# Patient Record
Sex: Male | Born: 1965 | Race: White | Hispanic: No | Marital: Single | Smoking: Former smoker
Health system: Southern US, Community
[De-identification: ages and names within clinical notes are randomized; demographics above are authoritative.]

## PROBLEM LIST (undated history)

## (undated) DIAGNOSIS — N189 Chronic kidney disease, unspecified: Secondary | ICD-10-CM

## (undated) DIAGNOSIS — N186 End stage renal disease: Secondary | ICD-10-CM

## (undated) DIAGNOSIS — D649 Anemia, unspecified: Secondary | ICD-10-CM

## (undated) DIAGNOSIS — I10 Essential (primary) hypertension: Secondary | ICD-10-CM

## (undated) DIAGNOSIS — N049 Nephrotic syndrome with unspecified morphologic changes: Secondary | ICD-10-CM

## (undated) HISTORY — DX: Chronic kidney disease, unspecified: N18.9

## (undated) HISTORY — PX: RENAL BIOPSY: SHX156

## (undated) HISTORY — PX: CHOLECYSTECTOMY: SHX55

## (undated) HISTORY — DX: Essential (primary) hypertension: I10

---

## 2006-08-20 ENCOUNTER — Observation Stay: Payer: Self-pay | Admitting: Internal Medicine

## 2006-08-20 ENCOUNTER — Other Ambulatory Visit: Payer: Self-pay

## 2008-01-05 ENCOUNTER — Emergency Department: Payer: Self-pay | Admitting: Emergency Medicine

## 2008-01-05 ENCOUNTER — Other Ambulatory Visit: Payer: Self-pay

## 2009-05-03 ENCOUNTER — Emergency Department: Payer: Self-pay | Admitting: Emergency Medicine

## 2010-08-27 ENCOUNTER — Emergency Department: Payer: Self-pay | Admitting: Emergency Medicine

## 2012-11-25 ENCOUNTER — Emergency Department: Payer: Self-pay | Admitting: Emergency Medicine

## 2012-11-25 LAB — COMPREHENSIVE METABOLIC PANEL
Alkaline Phosphatase: 81 U/L (ref 50–136)
BUN: 23 mg/dL — ABNORMAL HIGH (ref 7–18)
Calcium, Total: 8.5 mg/dL (ref 8.5–10.1)
Chloride: 112 mmol/L — ABNORMAL HIGH (ref 98–107)
Co2: 24 mmol/L (ref 21–32)
EGFR (African American): 50 — ABNORMAL LOW
EGFR (Non-African Amer.): 43 — ABNORMAL LOW
Osmolality: 284 (ref 275–301)
Potassium: 4.2 mmol/L (ref 3.5–5.1)
SGOT(AST): 30 U/L (ref 15–37)
Total Protein: 6.5 g/dL (ref 6.4–8.2)

## 2012-11-25 LAB — CBC
HCT: 45.6 % (ref 40.0–52.0)
HGB: 15.6 g/dL (ref 13.0–18.0)
MCHC: 34.1 g/dL (ref 32.0–36.0)
MCV: 95 fL (ref 80–100)
RBC: 4.81 10*6/uL (ref 4.40–5.90)
WBC: 6.3 10*3/uL (ref 3.8–10.6)

## 2012-11-25 LAB — CK TOTAL AND CKMB (NOT AT ARMC): CK-MB: 0.9 ng/mL (ref 0.5–3.6)

## 2013-06-06 ENCOUNTER — Emergency Department: Payer: Self-pay | Admitting: Emergency Medicine

## 2013-06-06 LAB — COMPREHENSIVE METABOLIC PANEL
Albumin: 3.3 g/dL — ABNORMAL LOW (ref 3.4–5.0)
Alkaline Phosphatase: 84 U/L (ref 50–136)
Anion Gap: 6 — ABNORMAL LOW (ref 7–16)
Bilirubin,Total: 0.4 mg/dL (ref 0.2–1.0)
Chloride: 105 mmol/L (ref 98–107)
Co2: 28 mmol/L (ref 21–32)
Creatinine: 2.19 mg/dL — ABNORMAL HIGH (ref 0.60–1.30)
EGFR (African American): 40 — ABNORMAL LOW
EGFR (Non-African Amer.): 35 — ABNORMAL LOW
Glucose: 99 mg/dL (ref 65–99)
Osmolality: 283 (ref 275–301)
SGOT(AST): 25 U/L (ref 15–37)
Sodium: 139 mmol/L (ref 136–145)

## 2013-06-06 LAB — URINALYSIS, COMPLETE
Bilirubin,UR: NEGATIVE
Hyaline Cast: 2
Ketone: NEGATIVE
Leukocyte Esterase: NEGATIVE
Ph: 6 (ref 4.5–8.0)
Protein: 150

## 2013-06-06 LAB — CBC
MCHC: 35.4 g/dL (ref 32.0–36.0)
Platelet: 366 10*3/uL (ref 150–440)
WBC: 9.7 10*3/uL (ref 3.8–10.6)

## 2014-04-26 ENCOUNTER — Emergency Department: Payer: Self-pay | Admitting: Emergency Medicine

## 2014-08-09 ENCOUNTER — Inpatient Hospital Stay: Payer: Self-pay | Admitting: Internal Medicine

## 2014-08-09 LAB — COMPREHENSIVE METABOLIC PANEL
Albumin: 2.3 g/dL — ABNORMAL LOW (ref 3.4–5.0)
Alkaline Phosphatase: 77 U/L
Anion Gap: 6 — ABNORMAL LOW (ref 7–16)
BUN: 39 mg/dL — ABNORMAL HIGH (ref 7–18)
Bilirubin,Total: 0.3 mg/dL (ref 0.2–1.0)
CALCIUM: 8.9 mg/dL (ref 8.5–10.1)
Chloride: 110 mmol/L — ABNORMAL HIGH (ref 98–107)
Co2: 24 mmol/L (ref 21–32)
Creatinine: 2.8 mg/dL — ABNORMAL HIGH (ref 0.60–1.30)
GFR CALC AF AMER: 31 — AB
GFR CALC NON AF AMER: 26 — AB
Glucose: 105 mg/dL — ABNORMAL HIGH (ref 65–99)
Osmolality: 289 (ref 275–301)
Potassium: 4 mmol/L (ref 3.5–5.1)
SGOT(AST): 27 U/L (ref 15–37)
SGPT (ALT): 35 U/L
Sodium: 140 mmol/L (ref 136–145)
Total Protein: 5.8 g/dL — ABNORMAL LOW (ref 6.4–8.2)

## 2014-08-09 LAB — CBC
HCT: 43.2 % (ref 40.0–52.0)
HGB: 14.8 g/dL (ref 13.0–18.0)
MCH: 32.6 pg (ref 26.0–34.0)
MCHC: 34.2 g/dL (ref 32.0–36.0)
MCV: 96 fL (ref 80–100)
PLATELETS: 336 10*3/uL (ref 150–440)
RBC: 4.53 10*6/uL (ref 4.40–5.90)
RDW: 12.3 % (ref 11.5–14.5)
WBC: 10.5 10*3/uL (ref 3.8–10.6)

## 2014-08-09 LAB — URINALYSIS, COMPLETE
BACTERIA: NONE SEEN
BLOOD: NEGATIVE
Bilirubin,UR: NEGATIVE
Glucose,UR: 150 mg/dL (ref 0–75)
Granular Cast: 5
Hyaline Cast: 2
Ketone: NEGATIVE
Leukocyte Esterase: NEGATIVE
Nitrite: NEGATIVE
Ph: 6 (ref 4.5–8.0)
Protein: >=500
RBC,UR: 1 /HPF (ref 0–5)
SPECIFIC GRAVITY: 1.014 (ref 1.003–1.030)
Squamous Epithelial: NONE SEEN
WBC UR: 1 /HPF (ref 0–5)

## 2014-08-09 LAB — TROPONIN I
Troponin-I: 0.04 ng/mL
Troponin-I: 0.04 ng/mL
Troponin-I: 0.04 ng/mL

## 2014-08-09 LAB — INFLUENZA A,B,H1N1 - PCR (ARMC)
H1N1FLUPCR: NOT DETECTED
Influenza A By PCR: NEGATIVE
Influenza B By PCR: NEGATIVE

## 2014-08-09 LAB — PROTEIN / CREATININE RATIO, URINE
CREATININE, URINE: 78.9 mg/dL (ref 30.0–125.0)
PROTEIN, RANDOM URINE: 672 mg/dL — AB (ref 0–12)
Protein/Creat. Ratio: 8517 mg/gCREAT — ABNORMAL HIGH (ref 0–200)

## 2014-08-09 LAB — CREATININE, SERUM
Creatinine: 2.44 mg/dL — ABNORMAL HIGH (ref 0.60–1.30)
EGFR (Non-African Amer.): 30 — ABNORMAL LOW
GFR CALC AF AMER: 37 — AB

## 2014-08-09 LAB — CK TOTAL AND CKMB (NOT AT ARMC)
CK, TOTAL: 156 U/L
CK, Total: 442 U/L — ABNORMAL HIGH
CK, Total: 461 U/L — ABNORMAL HIGH
CK-MB: 1.8 ng/mL (ref 0.5–3.6)
CK-MB: 2.7 ng/mL (ref 0.5–3.6)
CK-MB: 2.4 ng/mL (ref 0.5–3.6)

## 2014-08-09 LAB — LIPASE, BLOOD: LIPASE: 307 U/L (ref 73–393)

## 2014-08-09 LAB — CLOSTRIDIUM DIFFICILE(ARMC)

## 2014-08-09 LAB — HEMOGLOBIN A1C: Hemoglobin A1C: 5.2 % (ref 4.2–6.3)

## 2014-08-09 LAB — TSH: Thyroid Stimulating Horm: 1.8 u[IU]/mL

## 2014-08-10 LAB — CBC WITH DIFFERENTIAL/PLATELET
Basophil #: 0.1 10*3/uL (ref 0.0–0.1)
Basophil %: 0.9 %
EOS PCT: 3.3 %
Eosinophil #: 0.3 10*3/uL (ref 0.0–0.7)
HCT: 39.3 % — AB (ref 40.0–52.0)
HGB: 13.2 g/dL (ref 13.0–18.0)
Lymphocyte #: 1.8 10*3/uL (ref 1.0–3.6)
Lymphocyte %: 17 %
MCH: 32.4 pg (ref 26.0–34.0)
MCHC: 33.6 g/dL (ref 32.0–36.0)
MCV: 96 fL (ref 80–100)
MONO ABS: 0.8 x10 3/mm (ref 0.2–1.0)
MONOS PCT: 7.7 %
NEUTROS PCT: 71.1 %
Neutrophil #: 7.5 10*3/uL — ABNORMAL HIGH (ref 1.4–6.5)
PLATELETS: 268 10*3/uL (ref 150–440)
RBC: 4.08 10*6/uL — AB (ref 4.40–5.90)
RDW: 12.3 % (ref 11.5–14.5)
WBC: 10.5 10*3/uL (ref 3.8–10.6)

## 2014-08-10 LAB — BASIC METABOLIC PANEL
Anion Gap: 5 — ABNORMAL LOW (ref 7–16)
BUN: 30 mg/dL — ABNORMAL HIGH (ref 7–18)
Calcium, Total: 7.4 mg/dL — ABNORMAL LOW (ref 8.5–10.1)
Chloride: 112 mmol/L — ABNORMAL HIGH (ref 98–107)
Co2: 25 mmol/L (ref 21–32)
Creatinine: 2.75 mg/dL — ABNORMAL HIGH (ref 0.60–1.30)
EGFR (African American): 32 — ABNORMAL LOW
GFR CALC NON AF AMER: 26 — AB
GLUCOSE: 87 mg/dL (ref 65–99)
OSMOLALITY: 289 (ref 275–301)
POTASSIUM: 3.8 mmol/L (ref 3.5–5.1)
Sodium: 142 mmol/L (ref 136–145)

## 2015-01-01 ENCOUNTER — Inpatient Hospital Stay
Admit: 2015-01-01 | Disposition: A | Discharge status: Home / Self Care | Payer: Self-pay | Attending: Specialist | Admitting: Specialist

## 2015-01-01 LAB — CBC WITH DIFFERENTIAL/PLATELET
Basophil #: 0.1 10*3/uL (ref 0.0–0.1)
Basophil %: 1.1 %
Eosinophil #: 0.3 10*3/uL (ref 0.0–0.7)
Eosinophil %: 2 %
HCT: 36.7 % — AB (ref 40.0–52.0)
HGB: 12.3 g/dL — ABNORMAL LOW (ref 13.0–18.0)
LYMPHS ABS: 1.8 10*3/uL (ref 1.0–3.6)
Lymphocyte %: 14 %
MCH: 30.6 pg (ref 26.0–34.0)
MCHC: 33.6 g/dL (ref 32.0–36.0)
MCV: 91 fL (ref 80–100)
Monocyte #: 1 x10 3/mm (ref 0.2–1.0)
Monocyte %: 8.1 %
NEUTROS ABS: 9.4 10*3/uL — AB (ref 1.4–6.5)
Neutrophil %: 74.8 %
Platelet: 330 10*3/uL (ref 150–440)
RBC: 4.03 10*6/uL — ABNORMAL LOW (ref 4.40–5.90)
RDW: 13.3 % (ref 11.5–14.5)
WBC: 12.6 10*3/uL — ABNORMAL HIGH (ref 3.8–10.6)

## 2015-01-01 LAB — URINALYSIS, COMPLETE
Bilirubin,UR: NEGATIVE
Glucose,UR: >=500 mg/dL (ref 0–75)
Ketone: NEGATIVE
Leukocyte Esterase: NEGATIVE
NITRITE: NEGATIVE
Ph: 6 (ref 4.5–8.0)
Specific Gravity: 1.016 (ref 1.003–1.030)
Squamous Epithelial: NONE SEEN

## 2015-01-01 LAB — COMPREHENSIVE METABOLIC PANEL
ALK PHOS: 65 U/L
Albumin: 2.6 g/dL — ABNORMAL LOW
Anion Gap: 7 (ref 7–16)
BUN: 47 mg/dL — AB
Bilirubin,Total: <0.1 mg/dL — ABNORMAL LOW
CREATININE: 4.93 mg/dL — AB
Calcium, Total: 8.6 mg/dL — ABNORMAL LOW
Chloride: 113 mmol/L — ABNORMAL HIGH
Co2: 21 mmol/L — ABNORMAL LOW
EGFR (African American): 15 — ABNORMAL LOW
EGFR (Non-African Amer.): 13 — ABNORMAL LOW
Glucose: 93 mg/dL
Potassium: 4.1 mmol/L
SGOT(AST): 23 U/L
SGPT (ALT): 18 U/L
SODIUM: 141 mmol/L
TOTAL PROTEIN: 6 g/dL — AB

## 2015-01-01 LAB — LIPASE, BLOOD: Lipase: 39 U/L

## 2015-01-01 LAB — PROTEIN / CREATININE RATIO, URINE
CREATININE, URINE RANDOM: 55 mg/dL (ref 30–125)
Protein, Urine: 259 mg/dL (ref 0–9)
Protein/Creat. Ratio: 4709 mg/gCREAT — ABNORMAL HIGH (ref 0–200)

## 2015-01-01 LAB — RAPID HIV SCREEN (HIV 1/2 AB+AG)

## 2015-01-01 LAB — PHOSPHORUS: Phosphorus: 3 mg/dL

## 2015-01-02 LAB — BASIC METABOLIC PANEL
Anion Gap: 2 — ABNORMAL LOW (ref 7–16)
BUN: 38 mg/dL — ABNORMAL HIGH
CALCIUM: 7.6 mg/dL — AB
CO2: 20 mmol/L — AB
CREATININE: 4.26 mg/dL — AB
Chloride: 116 mmol/L — ABNORMAL HIGH
EGFR (African American): 18 — ABNORMAL LOW
GFR CALC NON AF AMER: 15 — AB
Glucose: 92 mg/dL
Potassium: 3.9 mmol/L
Sodium: 138 mmol/L

## 2015-01-02 LAB — CBC WITH DIFFERENTIAL/PLATELET
BASOS ABS: 0.1 10*3/uL (ref 0.0–0.1)
Basophil %: 1.2 %
EOS PCT: 5.5 %
Eosinophil #: 0.4 10*3/uL (ref 0.0–0.7)
HCT: 31.3 % — ABNORMAL LOW (ref 40.0–52.0)
HGB: 10.4 g/dL — ABNORMAL LOW (ref 13.0–18.0)
LYMPHS ABS: 1.9 10*3/uL (ref 1.0–3.6)
LYMPHS PCT: 24 %
MCH: 30.6 pg (ref 26.0–34.0)
MCHC: 33.3 g/dL (ref 32.0–36.0)
MCV: 92 fL (ref 80–100)
Monocyte #: 0.7 x10 3/mm (ref 0.2–1.0)
Monocyte %: 8.9 %
NEUTROS PCT: 60.4 %
Neutrophil #: 4.8 10*3/uL (ref 1.4–6.5)
PLATELETS: 267 10*3/uL (ref 150–440)
RBC: 3.41 10*6/uL — ABNORMAL LOW (ref 4.40–5.90)
RDW: 12.9 % (ref 11.5–14.5)
WBC: 7.9 10*3/uL (ref 3.8–10.6)

## 2015-01-02 LAB — UR PROT ELECTROPHORESIS, URINE RANDOM

## 2015-01-03 LAB — BASIC METABOLIC PANEL
ANION GAP: 0 — AB (ref 7–16)
Anion Gap: 4 — ABNORMAL LOW (ref 7–16)
BUN: 32 mg/dL — AB
BUN: 31 mg/dL — ABNORMAL HIGH
CALCIUM: 7.5 mg/dL — AB
CALCIUM: 8.4 mg/dL — AB
CHLORIDE: 117 mmol/L — AB
CO2: 19 mmol/L — AB
CREATININE: 3.88 mg/dL — AB
Chloride: 119 mmol/L — ABNORMAL HIGH
Co2: 19 mmol/L — ABNORMAL LOW
Creatinine: 3.67 mg/dL — ABNORMAL HIGH
EGFR (African American): 21 — ABNORMAL LOW
EGFR (Non-African Amer.): 17 — ABNORMAL LOW
GFR CALC AF AMER: 20 — AB
GFR CALC NON AF AMER: 18 — AB
GLUCOSE: 96 mg/dL
Glucose: 101 mg/dL — ABNORMAL HIGH
Potassium: 3.8 mmol/L
Potassium: 4.1 mmol/L
Sodium: 138 mmol/L
Sodium: 140 mmol/L

## 2015-01-03 LAB — PROTEIN ELECTROPHORESIS(ARMC)

## 2015-01-13 NOTE — Discharge Summary (Signed)
PATIENT NAME:  Larry [name], Larry [name] MR#:  [id number] DATE OF BIRTH:  Apr 20, 1966  DATE OF ADMISSION:  08/09/2014 DATE OF DISCHARGE:  08/10/2014  PRESENTING COMPLAINT: Nausea, vomiting, diarrhea and elevated blood pressure.   DISCHARGE DIAGNOSES:  1. Accelerated hypertension.  2. Suspected viral syndrome with nausea, vomiting, diarrhea, improved.  3. Chronic kidney disease, stage 3. 4. Tobacco abuse.   CODE STATUS: Full code.   DIET: 2 gram sodium renal diet.   MEDICATIONS:  1. Norvasc 10 mg daily.  2. Metoprolol 25 mg daily.  3. Hydralazine 25 mg b.i.d.  4. Aspirin 81 mg daily.   FOLLOWUP:  1. Follow up with Dr. Burt Ek in 1 to 2 weeks.  2. Follow up with [facility], Dr. Candiss Norse or Dr. Holley Raring, for chronic kidney disease, stage 3, and hypertension, first available appointment.   LABORATORY DATA: At discharge:, Creatinine 2.75. Potassium is 3.8. Troponin 0.04. Clostridium difficile negative. Positive for the antigen only.   EKG, normal sinus rhythm. Urinalysis negative for urinary tract infection.   BRIEF SUMMARY OF HOSPITAL COURSE: The patient is a 49 year old Caucasian gentleman with history of tobacco abuse and hypertension, came in with nausea, vomiting, diarrhea. He was found to have elevated blood pressure with systolic more than [id number]. He was admitted with:  1. Malignant hypertension. The patient's blood pressure improved. He was discharged on Norvasc, Toprol and hydralazine. Salt restricted diet was discussed.  2. Nausea, vomiting, diarrhea, likely viral syndrome, resolved. Tolerating p.o. diet. Received IV fluids.  3. Acute on chronic renal failure. IV fluids were received. The patient's baseline creatinine is around 2.4. He is set up with outpatient nephrology follow-up.  4. Tobacco abuse counseling done. The patient voiced understanding.  5. The patient remained a full code.   TIME SPENT: 40 minutes  ____________________________ Gus Height A. Posey Pronto,  MD sap:kl D: 08/11/2014 07:59:00 ET T: 08/11/2014 14:18:30 ET JOB#: [id number]  cc: Nazaria Riesen A. Posey Pronto, MD, <Dictator> Ilda Basset MD ELECTRONICALLY SIGNED 08/24/2014 14:06

## 2015-01-13 NOTE — H&P (Signed)
PATIENT NAME:  Larry [name], Larry [name] MR#:  [id number] DATE OF BIRTH:  12-Aug-1966  DATE OF ADMISSION:  08/09/2014  PRIMARY CARE PHYSICIAN: Dionisio David, MD  The patient is a 49 year old, Caucasian male with past medical history significant for history of hypertension, history of admission for malignant hypertension in 2007, also history of nephrotic syndrome with CKD, who presents to the hospital with complaints of nausea, vomiting. According to the patient, he was doing well up until a week ago when he started having intermittent nausea and vomiting. It would come and go; however, today at around 2:00 a.m., he was awakened by severe nausea, and he vomited numerous times. He also had a couple of episodes of watery diarrhea. There was no blood in his vomitus and no blood in his stool. He denies any significant fevers or chills; however, admits to feeling feverish today in the morning. He also feels somewhat sleepy and dizzy on presentation. His blood pressure in the Emergency Room was found to be 200/125, and the patient was given his usual dose of Norvasc. His blood pressure is somewhat improved to around 170s to 180s at present. Hospitalist services were contacted for admission. The patient was also noted to have acute renal failure with creatinine level of 2.8, with his baseline creatinine around 2. He was given 2 liters of IV fluids with improvement of his creatinine to 2.44. He was also noted to have significant proteinuria. Hospitalist services were contacted for admission.   PAST MEDICAL HISTORY: Significant for history of admission for hypertensive encephalopathy, malignant hypertension in 2007, history of blurry vision as well as headaches during the same admission. History of nephrotic syndrome, history of CKD. He has been followed in the past by [facility].   MEDICATIONS: Norvasc 10 mg p.o. daily.   SURGERIES: Cholecystectomy in the past.   ALLERGIES: PENICILLIN.   FAMILY HISTORY:  Significant for heart disease as well as diabetes mellitus.   SOCIAL HISTORY: Smokes 1 pack of cigarettes a day, has been smoking since the age of 52 for approximately 30 years now, calculated lifetime expenses for smoking were  $37,380 and drinks approximately 3 or 4 beers a day. No drugs. He is divorced, has one child.   REVIEW OF SYSTEMS: CONSTITUTIONAL: Positive for feeling feverish earlier today. Somewhat sleepy, uncomfortable. Some sinus congestion. Has been dizzy. Admits to having postnasal drip and sinus congestion as well as difficulty swallowing. Some cough with greenish phlegm production, for approximately 2 to 3 days now. Nausea, vomiting, as well as a few episodes of diarrheal stool. Denies any high fevers, weakness or pains or weight loss or gain.  EYES: Denies blurry vision, double vision, glaucoma or cataracts.  EARS, NOSE AND THROAT: Denies any tinnitus, allergies, epistaxis, sinus pain, dentures, difficulty swallowing.  RESPIRATORY: Denies any wheezes, asthma, COPD.   CARDIOVASCULAR: Denies chest pains, orthopnea, arrhythmias, palpitations or syncope.  GASTROINTESTINAL: Denies any abdominal pain, hematemesis, rectal bleeding. change in bowel habits.  GENITOURINARY: Denies any dysuria or hematuria, frequency, incontinence.  ENDOCRINOLOGY: Denies any polydipsia, nocturia, thyroid problems, heat or cold intolerance or thirst.  HEMATOLOGIC: Denies anemia, easy bruising or bleeding. No swollen glands.   SKIN: Denies any acne, rashes, change in moles.  MUSCULOSKELETAL: Denies arthritis, cramps, swelling.  NEUROLOGIC: No numbness, epilepsy or tremor.  PSYCHIATRIC: Denies anxiety, insomnia, depression.  PHYSICAL EXAMINATION:  VITAL SIGNS: On arrival to the hospital, the patient's temperature was 97.8, pulse 101, respirations 20, blood pressure 200/125, saturation was 98% on room air.  GENERAL:  This is a well-developed, well-nourished, Caucasian male, lying on the stretcher. HEENT:  His pupils are equal and reactive to light. Extraocular muscles are intact. No pharyngitis. Has normal hearing. No pharyngeal erythema. Mucosa is moist.  NECK:  No masses, supple, nontender. Thyroid is not enlarged. No adenopathy. No JVD or carotid bruits bilaterally. Full range of motion. LUNGS:  Clear to auscultation in all fields. Minimally diminished breath sounds but otherwise no rales or rhonchi. Wheezing with inspiration. Decreased breath sounds with inspiration and unremarkable percussion. CARDIOVASCULAR: S1, S2 appreciated. Rhythm is regular. PMI is not lateralized. Chest is nontender to palpation. 1+ pedal pulses. No lower extremity edema, calf tenderness or cyanosis is noted. ABDOMEN: Soft and nontender. Bowel sounds are present. No hepatosplenomegaly or masses were noted. RECTAL: Deferred. MUSCLE STRENGTH: Able to move all extremities. No cyanosis, degenerative joint disease or kyphosis. Gait is not tested. SKIN: Did not reveal any rashes, lesions, erythema, nodularity, induration. It was warm and dry to palpation.  LYMPHATIC: No lymphadenopathy in the cervical region. NEUROLOGIC: Cranial nerves grossly intact. Sensory is intact. No dysarthria or aphasia. The patient is alert, oriented to time, person, place, cooperative. Memory is good. PSYCHIATRIC: No significant confusion, agitation, or depression noted.  EKG showed normal sinus rhythm, 70 beats per minute, normal axis. ST-T elevations in V1 through V4 but otherwise no significant abnormalities.    The patient's laboratory data done on arrival to the Emergency Room showed a BUN and creatinine of 39 and 2.8, glucose of 105. After 2 liters of IV fluid administration, the patient's creatinine is 2.44. Estimated GFR was 26 initially on arrival to the hospital, improved to 30. The patient's lipase level is normal at 307. Liver enzymes unremarkable. Albumin level of 2.3. CBC within normal limits with white blood cell count of 10.5, hemoglobin  14.8, platelet count 336,000.   Urinalysis: Yellow, clear urine, 150 mg in deciliter  glucose, negative for bilirubin or ketones. Specific gravity was 1.014, pH was 6.0. Negative for blood, more than 500 protein, negative for nitrites or leukocyte esterase, 1 red blood cell, 1 white blood cells, no epithelial cells  or bacteria. Mucus was present, as well as 2 hyaline casts and 5 granular casts. Creatinine in urine was 78.9, protein random in urine was 272 with estimated protein:creatinine ratio reported as 8,517.   RADIOLOGIC STUDIES: A 3-way abdominal x-ray including PA of chest showed negative for abdominal radiographs as well as no acute cardiopulmonary disease.   ASSESSMENT AND PLAN:  1.  Malignant essential hypertension. Admission to the medical floor. Start him on labetalol orally as well as IV as needed and follow blood pressure readings.  2.  Nausea, vomiting. Questionable hypertensive encephalopathy. Supportive therapy, clear liquid diet.  3.  Acute on chronic renal failure. We will continue the patient on low rate IV fluids, and we will ask for nephrology consultation.  4.  Hyperglycemia. Get hemoglobin A1c.  5.  Tobacco abuse counseling. Nicotine replacement  therapy will be initiated. Discussed cessation for approximately five minutes. He was agreeable.   TIME SPENT: 50 minutes.   ____________________________ Theodoro Grist, MD rv:je D: 08/09/2014 12:19:50 ET T: 08/09/2014 13:07:05 ET JOB#: [id number]  cc: Theodoro Grist, MD, <Dictator> Dionisio David, MD Theodoro Grist MD ELECTRONICALLY SIGNED 09/14/2014 19:58

## 2015-01-16 ENCOUNTER — Other Ambulatory Visit
Admit: 2015-01-16 | Disposition: A | Discharge status: Home / Self Care | Payer: Self-pay | Attending: Nephrology | Admitting: Nephrology

## 2015-01-16 LAB — PROTEIN / CREATININE RATIO, URINE
CREATININE, URINE RANDOM: 132 mg/dL (ref 30–125)
PROTEIN, URINE: 1022 mg/dL (ref 0–9)
Protein/Creat. Ratio: 7742 mg/gCREAT — ABNORMAL HIGH (ref 0–200)

## 2015-01-18 ENCOUNTER — Other Ambulatory Visit: Payer: BLUE CROSS/BLUE SHIELD | Admitting: Nephrology

## 2015-01-18 ENCOUNTER — Observation Stay
Admit: 2015-01-18 | Disposition: A | Discharge status: Home / Self Care | Payer: Self-pay | Attending: Nephrology | Admitting: Nephrology

## 2015-01-18 LAB — URINALYSIS, COMPLETE
BACTERIA: NONE SEEN
Bilirubin,UR: NEGATIVE
Ketone: NEGATIVE
Leukocyte Esterase: NEGATIVE
NITRITE: NEGATIVE
PH: 6 (ref 4.5–8.0)
SPECIFIC GRAVITY: 1.011 (ref 1.003–1.030)

## 2015-01-18 LAB — HEMOGLOBIN: HGB: 9.8 g/dL — ABNORMAL LOW (ref 13.0–18.0)

## 2015-01-19 ENCOUNTER — Other Ambulatory Visit: Payer: Self-pay

## 2015-01-19 LAB — BASIC METABOLIC PANEL
ANION GAP: 6 — AB (ref 7–16)
BUN: 48 mg/dL — ABNORMAL HIGH
CALCIUM: 8 mg/dL — AB
CO2: 18 mmol/L — AB
Chloride: 114 mmol/L — ABNORMAL HIGH
Creatinine: 5.66 mg/dL — ABNORMAL HIGH
GFR CALC AF AMER: 13 — AB
GFR CALC NON AF AMER: 11 — AB
Glucose: 95 mg/dL
Potassium: 3.9 mmol/L
SODIUM: 138 mmol/L

## 2015-01-19 LAB — CBC WITH DIFFERENTIAL/PLATELET
BASOS PCT: 0.8 %
Basophil #: 0.1 10*3/uL (ref 0.0–0.1)
EOS ABS: 0.4 10*3/uL (ref 0.0–0.7)
Eosinophil %: 4 %
HCT: 30.1 % — AB (ref 40.0–52.0)
HGB: 9.9 g/dL — ABNORMAL LOW (ref 13.0–18.0)
LYMPHS ABS: 1.6 10*3/uL (ref 1.0–3.6)
LYMPHS PCT: 17.3 %
MCH: 30.5 pg (ref 26.0–34.0)
MCHC: 32.8 g/dL (ref 32.0–36.0)
MCV: 93 fL (ref 80–100)
Monocyte #: 0.8 x10 3/mm (ref 0.2–1.0)
Monocyte %: 9.1 %
NEUTROS ABS: 6.4 10*3/uL (ref 1.4–6.5)
Neutrophil %: 68.8 %
Platelet: 293 10*3/uL (ref 150–440)
RBC: 3.23 10*6/uL — ABNORMAL LOW (ref 4.40–5.90)
RDW: 13.5 % (ref 11.5–14.5)
WBC: 9.3 10*3/uL (ref 3.8–10.6)

## 2015-01-21 NOTE — Op Note (Addendum)
PATIENT NAME:  Larry [name], Larry [name] MR#:  [id number] DATE OF BIRTH:  [birth year]  DATE OF PROCEDURE:  01/18/2015  REASON FOR PROCEDURE AND PREOPERATIVE DIAGNOSES:  1. Chronic kidney disease, stage IV.  2. Nephrotic range proteinuria.   POSTOPERATIVE DIAGNOSES: 1. Chronic kidney disease, stage IV.  2. Nephrotic range proteinuria.   PROCEDURE:  Percutaneous ultrasound-guided left renal biopsy.   SURGEON: Carter Kaman Lilian Kapur, MD   DESCRIPTION OF PROCEDURE: After obtaining informed consent, the patient was brought down to the ultrasound suite. Subsequently, the left was identified under ultrasound. The left flank was prepped and draped in standard sterile fashion. Local anesthesia was achieved using 1% lidocaine in multiple locations. Subsequently, using an 18-gauge biopsy device and ultrasound guidance, a total of 6 passes were made into the left kidney. The specimens were submitted to pathology, and the pathologist deemed them to be adequate. There was a small left lower pole perinephric hematoma noted.   Overall, the patient tolerated the procedure well. The patient will return to his room for continued observation.   ESTIMATED BLOOD LOSS: Minimal.   COMPLICATIONS: Small perinephric hematoma noted on postbiopsy images with no active bleeding.     ____________________________ Tama High, MD mnl:tr D: 01/18/2015 12:22:06 ET T: 01/18/2015 12:49:18 ET JOB#: [id number]  cc: Tama High, MD, <Dictator> Mariah Milling Sunnie Odden MD ELECTRONICALLY SIGNED 01/20/2015 9:24

## 2015-01-21 NOTE — H&P (Signed)
PATIENT NAME:  Larry [name], Larry [name] MR#:  [id number] DATE OF BIRTH:  July 06, 1966  DATE OF ADMISSION:  01/01/2015  PRIMARY CARE PHYSICIAN: Dr. Rosanna Randy.  CHIEF COMPLAINT: Nausea, vomiting, and diarrhea.   HISTORY OF PRESENT ILLNESS: This is a 49 year old male, who presents to the hospital complaining of nausea, vomiting, and diarrhea that began acutely this morning around 4 a.m. The patient said he has had about 8 episodes of vomiting and about 2 episodes of diarrhea. The vomiting has been not bilious in nature and nonbloody. He also has had diarrhea, which has been loose and watery, but with no blood. He admits to some chills. No sick contacts. Since his symptoms are not improving and he started to be a little bit dizzy and lightheaded, he came to the ER for further evaluation. In the Emergency Room, the patient was noted to be in acute on chronic renal failure and noted to be dehydrated. The hospitalist services were contacted for further treatment and evaluation.   REVIEW OF SYSTEMS:   CONSTITUTIONAL: No documented fever. No weight gain or weight loss.  EYES: No blurred or double vision.  ENT: No tinnitus. No postnasal drip. No redness of the oropharynx.  RESPIRATORY: No cough, no wheeze, no hemoptysis, no dyspnea.  CARDIOVASCULAR: No chest pain, no orthopnea, no palpitations or syncope.  GASTROINTESTINAL: Positive nausea. Positive vomiting. Positive diarrhea. No abdominal pain. No melena or hematochezia.  GENITOURINARY: No dysuria or hematuria.  ENDOCRINE: No polyuria or nocturia. No heat or cold intolerance.  HEMATOLOGIC: No anemia. No bruising or bleeding.  INTEGUMENTARY: No rashes. No lesions.  MUSCULOSKELETAL: No arthritis. No swelling. No gout.  NEUROLOGIC: No numbness, tingling, or ataxia. No seizure-type activity.  PSYCHIATRIC: No anxiety, no insomnia. No ADD disorder.   PAST MEDICAL HISTORY: Consistent with hypertension, history of nephrotic syndrome and chronic kidney disease stage III.    ALLERGIES: AMOXICILLIN WHICH CAUSES WEAKNESS.   SOCIAL HISTORY: Does smoke about a pack per day, has been smoking for the past 30 years. Also used to drink 2 beers daily, but quit 2 months ago. No illicit drug abuse. Lives with his daughter.   FAMILY HISTORY: The patient's mother is alive, has renal disease and is on hemodialysis. Father died from a suicide attempt.   CURRENT MEDICATIONS: Are as follows, aspirin 81 mg daily, hydralazine 25 mg b.i.d., metoprolol succinate 25 mg daily, Norvasc 10 mg daily, Zofran 4 mg t.i.d. as needed.   PHYSICAL EXAMINATION: Presently is as follows:  VITAL SIGNS: Are noted to be temperature is 98.6, pulse 82, respirations 20, blood pressure 178/104, saturations 98% on room air.  GENERAL: The patient is a pleasant-appearing male, in no apparent distress.  HEENT: Atraumatic, normocephalic. Extraocular muscles are intact. Pupils equal and reactive. Sclerae are anicteric. No conjunctival injection. No pharyngeal erythema.  NECK: Supple. There is no jugular venous distention. No bruits. No lymphadenopathy or thyromegaly.  HEART: Regular rate and rhythm. No murmurs, no rubs, no clicks.  LUNGS: Clear to auscultation bilaterally. No rales or rhonchi. No wheezes.  ABDOMEN: Soft, flat, nontender, nondistended. Has good bowel sounds. No hepatosplenomegaly appreciated.  EXTREMITIES: No evidence of cyanosis, clubbing, or peripheral edema. Has +2 pedal and radial pulses bilaterally.  NEUROLOGICAL: The patient is alert, awake, and oriented x 3, with no focal motor or sensory deficits appreciated bilaterally.  SKIN: Moist and warm with no rashes appreciated.  LYMPHATIC: There is no cervical lymphadenopathy.   LABORATORY DATA: Showed a serum glucose of 93, BUN 47, creatinine 4.9, sodium 141,  potassium 4.1, chloride 113, bicarbonate 21. The patient's LFTs are within normal limits. White cell count is 12.2, hemoglobin 12.3, hematocrit 36.7, platelet count 130,000.    ASSESSMENT AND PLAN: This is a 49 year old male with history of hypertension, history of chronic kidney disease stage III, history of nephrotic syndrome, who presents to the hospital with nausea, vomiting, and diarrhea, noted to be in acute on chronic renal failure.   1. Acute on chronic renal failure. This is likely due to volume loss from the nausea, vomiting, and diarrhea. I will hydrate the patient with IV fluids, follow BUN and creatinine. The patient's baseline creatinine is around 2.4 to 2.7, currently elevated to 4.9. We will get a nephrology consult as the patient has a history of nephrotic syndrome and has not had followup in a couple of years.  2. Hypertension. This is accelerated hypertension. I will continue his Toprol, Norvasc and hydralazine. We will add some p.r.n. IV hydralazine. Follow hemodynamics.  3. Nausea, vomiting, and diarrhea. I suspect this is probably a viral syndrome or gastroenteritis. Continue supportive care with IV fluids, antiemetics and a clear liquid diet. I will check stool for Clostridium difficile and comprehensive culture. 4. Leukocytosis. I suspect this is stress mediated from the nausea, vomiting, and diarrhea. I will follow his white cell count has clinical symptoms improve.   CODE STATUS: The patient is a FULL CODE.   TIME SPENT ON ADMISSION: 50 minutes.    ____________________________ Belia Heman. Verdell Carmine, MD vjs:JT D: 01/01/2015 09:25:46 ET T: 01/01/2015 09:46:35 ET JOB#: [id number]  cc: Belia Heman. Verdell Carmine, MD, <Dictator> Henreitta Leber MD ELECTRONICALLY SIGNED 01/10/2015 11:28

## 2015-01-21 NOTE — Discharge Summary (Signed)
PATIENT NAME:  Larry [name], HLAVAC MR#:  [id number] DATE OF BIRTH:  March 05, 1966  DATE OF DISCHARGE:  01/03/2015.  HISTORY OF PRESENT ILLNESS:  For a detailed note, please see the history and physical done on admission.   DIAGNOSES AT DISCHARGE:  Acute-on-chronic renal failure; nausea, vomiting, diarrhea secondary to a viral syndrome, now resolved; history of nephrotic syndrome; accelerated hypertension.   DIET:  The patient is being discharged on a low-sodium diet.   ACTIVITY:  As tolerated.    FOLLOWUP:  With Dr. Anthonette Legato the next 1-2 weeks.    DISCHARGE MEDICATIONS:  Norvasc 10 mg daily, aspirin 81 mg daily, metoprolol succinate 50 mg daily, hydralazine 25 mg q.i.d., and Zofran 4 mg t.i.d. as needed for nausea, vomiting.    CONSULTANTS DURING THE HOSPITAL COURSE: Dr.  Anthonette Legato of nephrology.   PERTINENT STUDIES DONE DURING HOSPITAL COURSE:  An ultrasound of the kidneys done on April 11 showing mild increase in echogenicity in both kidneys.  This is nonspecific in the setting of medical renal disease.  Mild right-sided hydronephrosis.   HOSPITAL COURSE:  This is a 49 year old male with medical problems as mentioned above, presented to the hospital on 01/01/2015 due to nausea, vomiting, and diarrhea, and noted to be in acute-on-chronic renal failure.   PROBLEMS: 1. Acute-on-chronic renal failure.  This was likely secondary to dehydration and volume loss from his nausea, vomiting, and diarrhea.  The patient's baseline creatinine is around 2.4 to 2.7.  It was elevated as high as 4.9.  The patient was admitted to the hospital and started on aggressive IV fluid hydration.  His creatinine has trended down and is down to as low as 3.6.  His nausea, vomiting, and diarrhea have now resolved.  The patient was seen by nephrology as he has a history of nephrotic syndrome but has not had any followup in two years.  The patient likely needs a renal biopsy and further nephrology workup which is to be  done as an outpatient.  The patient is going to follow up with Dr. Holley Raring later this week for a repeat creatinine and follow up with him within the next week to two weeks as an outpatient for possible renal biopsy.  Since patient is clinically feeling better and his creatinine has trended down with IV fluids, he is being discharged home.  2. Nausea, vomiting, and diarrhea.  This was likely secondary to a viral gastroenteritis.  Shortly after admission, this had resolved with supportive care.  His diet was slowly advanced from a clear liquid to a regular diet which he is tolerating now with no nausea, vomiting, and diarrhea.  3. Accelerated hypertension.  The patient's blood pressure was quite elevated prior to coming in.  His Toprol dose was advanced.  His hydralazine dose was also advanced  which he is being discharged on along with his scheduled Norvasc.  Further titrations to his antihypertensive medications can be done by his primary physician or through the nephrologist's office.  4. Leukocytosis.  This was likely stress mediated from his nausea, vomiting, and diarrhea.  It has improved and resolved now.   CODE STATUS:  The patient is a full code.   DISPOSITION:  He is being discharged home.   TIME SPENT ON DISCHARGE:  Forty minutes.   ____________________________ Belia Heman. Verdell Carmine, MD vjs:kc D: 01/03/2015 16:59:23 ET T: 01/03/2015 17:22:19 ET JOB#: [id number]  cc: Belia Heman. Verdell Carmine, MD, <Dictator> Munsoor Lilian Kapur, MD Henreitta Leber MD ELECTRONICALLY SIGNED 01/10/2015 11:29

## 2015-02-07 ENCOUNTER — Encounter: Payer: Self-pay | Admitting: Oncology

## 2015-02-07 ENCOUNTER — Inpatient Hospital Stay: Payer: BLUE CROSS/BLUE SHIELD

## 2015-02-07 ENCOUNTER — Inpatient Hospital Stay: Payer: BLUE CROSS/BLUE SHIELD | Attending: Oncology | Admitting: Oncology

## 2015-02-07 ENCOUNTER — Encounter (INDEPENDENT_AMBULATORY_CARE_PROVIDER_SITE_OTHER): Payer: Self-pay

## 2015-02-07 VITALS — BP 143/84 | HR 68 | Temp 96.7°F | Resp 20 | Ht 66.0 in | Wt 154.1 lb

## 2015-02-07 DIAGNOSIS — Z79899 Other long term (current) drug therapy: Secondary | ICD-10-CM

## 2015-02-07 DIAGNOSIS — N052 Unspecified nephritic syndrome with diffuse membranous glomerulonephritis: Secondary | ICD-10-CM

## 2015-02-07 DIAGNOSIS — N189 Chronic kidney disease, unspecified: Secondary | ICD-10-CM

## 2015-02-07 DIAGNOSIS — I129 Hypertensive chronic kidney disease with stage 1 through stage 4 chronic kidney disease, or unspecified chronic kidney disease: Secondary | ICD-10-CM

## 2015-02-07 DIAGNOSIS — F17210 Nicotine dependence, cigarettes, uncomplicated: Secondary | ICD-10-CM | POA: Diagnosis not present

## 2015-02-07 MED ORDER — METHYLPREDNISOLONE SODIUM SUCC 125 MG IJ SOLR: 1000.0000 mg | Freq: Once | INTRAMUSCULAR | Status: DC

## 2015-02-07 MED ORDER — SODIUM CHLORIDE 0.9 % IV SOLN
Freq: Once | INTRAVENOUS | Status: AC
  Administered 2015-02-07: 11:00:00 via INTRAVENOUS
  Filled 2015-02-07: qty 250

## 2015-02-07 MED ORDER — METHYLPREDNISOLONE SODIUM SUCC 1000 MG IJ SOLR: 1000.0000 mg | Freq: Once | INTRAMUSCULAR | Status: DC

## 2015-02-07 MED ORDER — SODIUM CHLORIDE 0.9 % IV SOLN
INTRAVENOUS | Status: DC
  Administered 2015-02-07: 11:00:00 via INTRAVENOUS
  Filled 2015-02-07: qty 250

## 2015-02-08 ENCOUNTER — Inpatient Hospital Stay: Payer: BLUE CROSS/BLUE SHIELD

## 2015-02-08 VITALS — BP 133/69 | HR 68 | Temp 97.0°F | Resp 18

## 2015-02-08 DIAGNOSIS — N052 Unspecified nephritic syndrome with diffuse membranous glomerulonephritis: Secondary | ICD-10-CM | POA: Diagnosis not present

## 2015-02-08 MED ORDER — METHYLPREDNISOLONE SODIUM SUCC 1000 MG IJ SOLR: 1000.0000 mg | Freq: Once | INTRAMUSCULAR | Status: DC

## 2015-02-08 MED ORDER — METHYLPREDNISOLONE SODIUM SUCC 1000 MG IJ SOLR
1000.0000 mg | Freq: Once | INTRAMUSCULAR | Status: DC
Start: 2015-02-08 — End: 2015-02-08

## 2015-02-08 MED ORDER — SODIUM CHLORIDE 0.9 % IV SOLN
Freq: Once | INTRAVENOUS | Status: AC
  Administered 2015-02-08: 14:00:00 via INTRAVENOUS
  Filled 2015-02-08: qty 250

## 2015-02-09 ENCOUNTER — Inpatient Hospital Stay: Payer: BLUE CROSS/BLUE SHIELD

## 2015-02-09 ENCOUNTER — Ambulatory Visit: Payer: BLUE CROSS/BLUE SHIELD

## 2015-02-09 VITALS — BP 141/76 | HR 75 | Temp 97.0°F | Resp 18

## 2015-02-09 DIAGNOSIS — N052 Unspecified nephritic syndrome with diffuse membranous glomerulonephritis: Secondary | ICD-10-CM | POA: Diagnosis not present

## 2015-02-09 MED ORDER — SODIUM CHLORIDE 0.9 % IV SOLN
INTRAVENOUS | Status: DC
  Administered 2015-02-09: 09:00:00 via INTRAVENOUS
  Filled 2015-02-09: qty 250

## 2015-02-09 MED ORDER — METHYLPREDNISOLONE SODIUM SUCC 1000 MG IJ SOLR
1000.0000 mg | Freq: Once | INTRAMUSCULAR | Status: DC
  Administered 2015-02-09: 1000 mg via INTRAVENOUS

## 2015-02-13 LAB — SURGICAL PATHOLOGY

## 2015-02-14 ENCOUNTER — Ambulatory Visit: Payer: BLUE CROSS/BLUE SHIELD

## 2015-02-16 NOTE — Progress Notes (Signed)
[name]  Telephone:(336) [phone] Fax:(336) [phone]  ID: Larry [name] OB: [birth year]  MR#: [id number]  [id number]  Patient Care Team: Anthonette Legato, MD as PCP - General (Internal Medicine)  CHIEF COMPLAINT:  Chief Complaint  Patient presents with  . 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 He is referred to the [facility] to receive IV Solu-Medrol 3 days. Currently, he feels well and is asymptomatic. He denies any recent fevers or illnesses. He denies any chest pain or shortness of breath. He has no urinary complaints. Patient feels at his baseline and offers no specific complaints today.  REVIEW OF SYSTEMS:   Review of Systems  Constitutional: Negative.   Gastrointestinal: Negative.   Genitourinary: Negative.     As per HPI. Otherwise, a complete review of systems is negatve.  PAST MEDICAL HISTORY: Past Medical History  Diagnosis Date  . Hypertension   . Chronic kidney disease     PAST SURGICAL HISTORY: Past Surgical History  Procedure Laterality Date  . Cholecystectomy    . Renal biopsy      FAMILY HISTORY Family History  Problem Relation Age of Onset  . Diabetes Mother   . Kidney disease Mother     mother on dialysis  . CAD Mother   . Diabetes Sister   . CAD Sister        ADVANCED DIRECTIVES:    HEALTH MAINTENANCE: History  Substance Use Topics  . Smoking status: Current Every Day Smoker -- 1.00 packs/day for 20 years    Types: Cigarettes  . Smokeless tobacco: Not on file  . Alcohol Use: Yes     Comment: occ. alcohol use     Colonoscopy:  PAP:  Bone density:  Lipid panel:  Allergies  Allergen Reactions  . Amoxicillin Nausea And Vomiting    Current Outpatient Prescriptions  Medication Sig Dispense Refill  . acetaminophen (TYLENOL) 325 MG tablet Take 650 mg by  mouth every 4 (four) hours as needed. As needed mild pain or temp greater than 100.4    . amLODipine (NORVASC) 10 MG tablet Take 10 mg by mouth daily.    . hydrALAZINE (APRESOLINE) 25 MG tablet Take 25 mg by mouth 4 (four) times daily.    Marland Kitchen METOPROLOL SUCCINATE ER PO Take by mouth.     No current facility-administered medications for this visit.   Facility-Administered Medications Ordered in Other Visits  Medication Dose Route Frequency Provider Last Rate Last Dose  . sodium chloride 0.9 % 250 mL with methylPREDNISolone sodium succinate (SOLU-MEDROL) 1,000 mg infusion   Intravenous Continuous [name], MD 250 mL/hr at 02/09/15 0850      OBJECTIVE: Filed Vitals:   02/07/15 0933  BP: 143/84  Pulse: 68  Temp: 96.7 F (35.9 C)  Resp: 20     Body mass index is 24.88 kg/(m^2).    ECOG FS:0 - Asymptomatic  General: Well-developed, well-nourished, no acute distress. Eyes: Pink conjunctiva, anicteric sclera. HEENT: Normocephalic, moist mucous membranes, clear oropharnyx. Lungs: Clear to auscultation bilaterally. Heart: Regular rate and rhythm. No rubs, murmurs, or gallops. Abdomen: Soft, nontender, nondistended. No organomegaly noted, normoactive bowel sounds. Musculoskeletal: No edema, cyanosis, or clubbing. Neuro: Alert, answering all questions appropriately. Cranial nerves grossly intact. Skin: No rashes or petechiae noted. Psych: Normal affect. Lymphatics: No cervical, calvicular, axillary or inguinal LAD.   LAB RESULTS:  Lab  Results  Component Value Date   NA 138 01/19/2015   K 3.9 01/19/2015   CL 114* 01/19/2015   CO2 18* 01/19/2015   GLUCOSE 95 01/19/2015   BUN 48* 01/19/2015   CREATININE 5.66* 01/19/2015   CALCIUM 8.0* 01/19/2015   PROT 6.0* 01/01/2015   ALBUMIN 2.6* 01/01/2015   AST 23 01/01/2015   ALT 18 01/01/2015   ALKPHOS 65 01/01/2015   GFRNONAA 11* 01/19/2015   GFRAA 13* 01/19/2015    Lab Results  Component Value Date   WBC 9.3 01/19/2015    NEUTROABS 6.4 01/19/2015   HGB 9.9* 01/19/2015   HCT 30.1* 01/19/2015   MCV 93 01/19/2015   PLT 293 01/19/2015     STUDIES: US Thyroid Biopsy  01/18/2015   CLINICAL DATA:  Chronic renal disease.  EXAM: ULTRASOUND CORE BIOPSY  TECHNIQUE: Left renal biopsy performed by nephrology. Reference is made to there report. Images obtained for guidance and post biopsy.  COMPARISON:  None.  FINDINGS: Needle is noted in the left kidney. Post biopsy approximately 5 cm hematoma is noted. Follow-up imaging can be obtained as needed.  IMPRESSION: 1. Left renal biopsy. 2. Post biopsy hematoma. The patient's nephrologist is aware of this finding.   Electronically Signed   By: Marcello Moores  Register   On: 01/18/2015 12:57    ASSESSMENT: Membranous glomerulonephritis  PLAN:    1. Membranous glomerulonephritis: Patient will receive oral Cytoxan from nephrology. He will receive 1 g IV Solu-Medrol on days 1, 2, and 3. He will receive this IV during months 1, 3, and 5. All his laboratory work will be monitored by nephrology. Because membranous glomerulonephritis can be related to an underlying malignancy, will order a CT of the chest, abdomen, and pelvis for completeness. Return to clinic in 2 months for consideration of his next infusion of Solu-Medrol 3 days.  Patient expressed understanding and was in agreement with this plan. He also understands that He can call clinic at any time with any questions, concerns, or complaints.   No matching staging information was found for the patient.  [name], MD   02/16/2015 3:27 PM

## 2015-03-06 ENCOUNTER — Other Ambulatory Visit: Payer: Self-pay | Admitting: Oncology

## 2015-03-06 DIAGNOSIS — N052 Unspecified nephritic syndrome with diffuse membranous glomerulonephritis: Secondary | ICD-10-CM

## 2015-03-07 ENCOUNTER — Ambulatory Visit
Admission: RE | Admit: 2015-03-07 | Discharge: 2015-03-07 | Disposition: A | Discharge status: Home / Self Care | Payer: BLUE CROSS/BLUE SHIELD | Source: Ambulatory Visit | Attending: Oncology | Admitting: Oncology

## 2015-03-07 DIAGNOSIS — Z720 Tobacco use: Secondary | ICD-10-CM | POA: Insufficient documentation

## 2015-03-07 DIAGNOSIS — N032 Chronic nephritic syndrome with diffuse membranous glomerulonephritis: Secondary | ICD-10-CM | POA: Diagnosis present

## 2015-03-07 DIAGNOSIS — I10 Essential (primary) hypertension: Secondary | ICD-10-CM | POA: Diagnosis not present

## 2015-03-07 DIAGNOSIS — N052 Unspecified nephritic syndrome with diffuse membranous glomerulonephritis: Secondary | ICD-10-CM

## 2015-04-11 ENCOUNTER — Inpatient Hospital Stay: Payer: BLUE CROSS/BLUE SHIELD | Attending: Oncology

## 2015-04-11 ENCOUNTER — Inpatient Hospital Stay (HOSPITAL_BASED_OUTPATIENT_CLINIC_OR_DEPARTMENT_OTHER): Payer: BLUE CROSS/BLUE SHIELD | Admitting: Oncology

## 2015-04-11 ENCOUNTER — Inpatient Hospital Stay: Payer: BLUE CROSS/BLUE SHIELD

## 2015-04-11 VITALS — BP 156/74 | HR 80 | Temp 97.2°F | Resp 18 | Wt 155.9 lb

## 2015-04-11 DIAGNOSIS — Z79899 Other long term (current) drug therapy: Secondary | ICD-10-CM | POA: Insufficient documentation

## 2015-04-11 DIAGNOSIS — N189 Chronic kidney disease, unspecified: Secondary | ICD-10-CM | POA: Diagnosis not present

## 2015-04-11 DIAGNOSIS — N032 Chronic nephritic syndrome with diffuse membranous glomerulonephritis: Secondary | ICD-10-CM | POA: Insufficient documentation

## 2015-04-11 DIAGNOSIS — I129 Hypertensive chronic kidney disease with stage 1 through stage 4 chronic kidney disease, or unspecified chronic kidney disease: Secondary | ICD-10-CM

## 2015-04-11 DIAGNOSIS — F17210 Nicotine dependence, cigarettes, uncomplicated: Secondary | ICD-10-CM | POA: Insufficient documentation

## 2015-04-11 DIAGNOSIS — N052 Unspecified nephritic syndrome with diffuse membranous glomerulonephritis: Secondary | ICD-10-CM

## 2015-04-11 MED ORDER — METHYLPREDNISOLONE SODIUM SUCC 125 MG IJ SOLR: 1000.0000 mg | Freq: Every day | INTRAMUSCULAR | Status: DC

## 2015-04-11 MED ORDER — SODIUM CHLORIDE 0.9 % IV SOLN
Freq: Once | INTRAVENOUS | Status: AC
  Administered 2015-04-11: 16:00:00 via INTRAVENOUS
  Filled 2015-04-11: qty 250

## 2015-04-12 ENCOUNTER — Inpatient Hospital Stay: Payer: BLUE CROSS/BLUE SHIELD

## 2015-04-12 DIAGNOSIS — N032 Chronic nephritic syndrome with diffuse membranous glomerulonephritis: Secondary | ICD-10-CM

## 2015-04-12 MED ORDER — SODIUM CHLORIDE 0.9 % IV SOLN: Freq: Once | INTRAVENOUS | Status: DC

## 2015-04-12 MED ORDER — SODIUM CHLORIDE 0.9 % IV SOLN
Freq: Once | INTRAVENOUS | Status: AC
  Administered 2015-04-12: 15:00:00 via INTRAVENOUS
  Filled 2015-04-12: qty 250

## 2015-04-13 ENCOUNTER — Inpatient Hospital Stay: Payer: BLUE CROSS/BLUE SHIELD

## 2015-04-13 DIAGNOSIS — N032 Chronic nephritic syndrome with diffuse membranous glomerulonephritis: Secondary | ICD-10-CM | POA: Diagnosis not present

## 2015-04-13 DIAGNOSIS — N181 Chronic kidney disease, stage 1: Secondary | ICD-10-CM

## 2015-04-13 MED ORDER — SODIUM CHLORIDE 0.9 % IV SOLN
Freq: Once | INTRAVENOUS | Status: AC
  Administered 2015-04-13: 14:00:00 via INTRAVENOUS
  Filled 2015-04-13: qty 250

## 2015-04-13 MED ORDER — METHYLPREDNISOLONE SODIUM SUCC 125 MG IJ SOLR: 1000.0000 mg | Freq: Every day | INTRAMUSCULAR | Status: DC

## 2015-04-13 MED ORDER — SODIUM CHLORIDE 0.9 % IV SOLN
Freq: Once | INTRAVENOUS | Status: DC
  Filled 2015-04-13: qty 250

## 2015-04-13 MED ORDER — SODIUM CHLORIDE 0.9 % IV SOLN
INTRAVENOUS | Status: DC
  Administered 2015-04-13: 15:00:00 via INTRAVENOUS
  Filled 2015-04-13: qty 1000

## 2015-04-22 NOTE — Progress Notes (Signed)
[place]  Telephone:(336) [phone] Fax:(336) [phone]  ID: Larry [name] OB: May 30, 1966  MR#: [id number]  [id number]  Patient Care Team: Shepard General, MD as PCP - General (General Practice)  CHIEF COMPLAINT:  Chief Complaint  Patient presents with  . Follow-up    solu-medrol infusions    INTERVAL HISTORY: Patient returns to clinic for further evaluation and continuation of IV Solu-Medrol 3 days for membranous glomerulonephritis. He is tolerating his oral Cytoxan well without significant side effects. Currently, he feels well and is asymptomatic. He denies any recent fevers or illnesses. He denies any chest pain or shortness of breath. He has no urinary complaints. Patient feels at his baseline and offers no specific complaints today.  REVIEW OF SYSTEMS:   Review of Systems  Constitutional: Negative.   Gastrointestinal: Negative.   Genitourinary: Negative.     As per HPI. Otherwise, a complete review of systems is negatve.  PAST MEDICAL HISTORY: Past Medical History  Diagnosis Date  . Hypertension   . Chronic kidney disease     PAST SURGICAL HISTORY: Past Surgical History  Procedure Laterality Date  . Cholecystectomy    . Renal biopsy      FAMILY HISTORY Family History  Problem Relation Age of Onset  . Diabetes Mother   . Kidney disease Mother     mother on dialysis  . CAD Mother   . Diabetes Sister   . CAD Sister        ADVANCED DIRECTIVES:    HEALTH MAINTENANCE: History  Substance Use Topics  . Smoking status: Current Every Day Smoker -- 1.00 packs/day for 20 years    Types: Cigarettes  . Smokeless tobacco: Not on file  . Alcohol Use: Yes     Comment: occ. alcohol use     Colonoscopy:  PAP:  Bone density:  Lipid panel:  Allergies  Allergen Reactions  . Amoxicillin Nausea And Vomiting    Current Outpatient Prescriptions  Medication Sig Dispense Refill  . acetaminophen (TYLENOL) 325 MG tablet Take 650 mg by  mouth every 4 (four) hours as needed. As needed mild pain or temp greater than 100.4    . amLODipine (NORVASC) 10 MG tablet Take 10 mg by mouth daily.    . hydrALAZINE (APRESOLINE) 25 MG tablet Take 25 mg by mouth 4 (four) times daily.    Marland Kitchen METOPROLOL SUCCINATE ER PO Take by mouth.     No current facility-administered medications for this visit.    OBJECTIVE: Filed Vitals:   04/11/15 1455  BP: 156/74  Pulse: 80  Temp: 97.2 F (36.2 C)  Resp: 18     Body mass index is 25.17 kg/(m^2).    ECOG FS:0 - Asymptomatic  General: Well-developed, well-nourished, no acute distress. Eyes: Pink conjunctiva, anicteric sclera. Lungs: Clear to auscultation bilaterally. Heart: Regular rate and rhythm. No rubs, murmurs, or gallops. Abdomen: Soft, nontender, nondistended. No organomegaly noted, normoactive bowel sounds. Musculoskeletal: No edema, cyanosis, or clubbing. Neuro: Alert, answering all questions appropriately. Cranial nerves grossly intact. Skin: No rashes or petechiae noted. Psych: Normal affect.   LAB RESULTS:  Lab Results  Component Value Date   NA 138 01/19/2015   K 3.9 01/19/2015   CL 114* 01/19/2015   CO2 18* 01/19/2015   GLUCOSE 95 01/19/2015   BUN 48* 01/19/2015   CREATININE 5.66* 01/19/2015   CALCIUM 8.0* 01/19/2015   PROT 6.0* 01/01/2015   ALBUMIN 2.6* 01/01/2015   AST 23 01/01/2015   ALT 18 01/01/2015  ALKPHOS 65 01/01/2015   BILITOT <0.1* 01/01/2015   GFRNONAA 11* 01/19/2015   GFRAA 13* 01/19/2015    Lab Results  Component Value Date   WBC 9.3 01/19/2015   NEUTROABS 6.4 01/19/2015   HGB 9.9* 01/19/2015   HCT 30.1* 01/19/2015   MCV 93 01/19/2015   PLT 293 01/19/2015     STUDIES: No results found.  ASSESSMENT: Membranous glomerulonephritis  PLAN:    1. Membranous glomerulonephritis: Proceed with month 3 of IV Solu-Medrol.  Patient will receive oral Cytoxan from nephrology. He will receive 1 g IV Solu-Medrol on days 1, 2, and 3. He will receive  this IV during months 1, 3, and 5. All his laboratory work will be monitored by nephrology. Because membranous glomerulonephritis can be related to an underlying malignancy, a CT scan was previously ordered. This was reviewed independently and revealed no evidence of disease. Return to clinic in 2 months for consideration of his next infusion of Solu-Medrol 3 days.  Patient expressed understanding and was in agreement with this plan. He also understands that He can call clinic at any time with any questions, concerns, or complaints.    [name], MD   04/22/2015 10:52 AM

## 2015-06-12 ENCOUNTER — Inpatient Hospital Stay: Payer: BLUE CROSS/BLUE SHIELD

## 2015-06-12 ENCOUNTER — Other Ambulatory Visit: Payer: BLUE CROSS/BLUE SHIELD

## 2015-06-12 ENCOUNTER — Inpatient Hospital Stay: Payer: BLUE CROSS/BLUE SHIELD | Attending: Oncology | Admitting: Oncology

## 2015-06-12 VITALS — BP 166/86 | HR 69 | Temp 98.0°F | Wt 162.6 lb

## 2015-06-12 DIAGNOSIS — F17210 Nicotine dependence, cigarettes, uncomplicated: Secondary | ICD-10-CM | POA: Insufficient documentation

## 2015-06-12 DIAGNOSIS — I129 Hypertensive chronic kidney disease with stage 1 through stage 4 chronic kidney disease, or unspecified chronic kidney disease: Secondary | ICD-10-CM

## 2015-06-12 DIAGNOSIS — N189 Chronic kidney disease, unspecified: Secondary | ICD-10-CM | POA: Diagnosis not present

## 2015-06-12 DIAGNOSIS — N059 Unspecified nephritic syndrome with unspecified morphologic changes: Secondary | ICD-10-CM | POA: Insufficient documentation

## 2015-06-12 DIAGNOSIS — Z79899 Other long term (current) drug therapy: Secondary | ICD-10-CM

## 2015-06-12 MED ORDER — METHYLPREDNISOLONE SODIUM SUCC 1000 MG IJ SOLR
1000.0000 mg | Freq: Once | INTRAMUSCULAR | Status: AC
Start: 2015-06-12 — End: 2015-06-12
  Administered 2015-06-12: 1000 mg via INTRAVENOUS
  Filled 2015-06-12: qty 8

## 2015-06-12 MED ORDER — SODIUM CHLORIDE 0.9 % IV SOLN
INTRAVENOUS | Status: DC
  Administered 2015-06-12: 15:00:00 via INTRAVENOUS
  Filled 2015-06-12: qty 1000

## 2015-06-12 NOTE — Progress Notes (Signed)
Patient here for follow up. No complaints today. 

## 2015-06-12 NOTE — Progress Notes (Signed)
   06/12/15 1355  Clinical Encounter Type  Visited With Patient  Visit Type Initial  Provided pastoral support and presence to patient in the cancer center.  [place] [phone]

## 2015-06-13 ENCOUNTER — Inpatient Hospital Stay: Payer: BLUE CROSS/BLUE SHIELD

## 2015-06-13 VITALS — BP 154/81 | HR 80 | Temp 97.4°F | Resp 18

## 2015-06-13 DIAGNOSIS — N059 Unspecified nephritic syndrome with unspecified morphologic changes: Secondary | ICD-10-CM

## 2015-06-13 MED ORDER — SODIUM CHLORIDE 0.9 % IV SOLN
1000.0000 mg | Freq: Once | INTRAVENOUS | Status: AC
  Administered 2015-06-13: 1000 mg via INTRAVENOUS
  Filled 2015-06-13: qty 8

## 2015-06-13 MED ORDER — SODIUM CHLORIDE 0.9 % IV SOLN
INTRAVENOUS | Status: DC
  Administered 2015-06-13: 14:00:00 via INTRAVENOUS
  Filled 2015-06-13: qty 1000

## 2015-06-14 ENCOUNTER — Inpatient Hospital Stay: Payer: BLUE CROSS/BLUE SHIELD

## 2015-06-14 DIAGNOSIS — N059 Unspecified nephritic syndrome with unspecified morphologic changes: Secondary | ICD-10-CM

## 2015-06-14 MED ORDER — SODIUM CHLORIDE 0.9 % IV SOLN
INTRAVENOUS | Status: DC
  Administered 2015-06-14: 14:00:00 via INTRAVENOUS
  Filled 2015-06-14: qty 1000

## 2015-06-14 MED ORDER — SODIUM CHLORIDE 0.9 % IV SOLN
1000.0000 mg | Freq: Once | INTRAVENOUS | Status: AC
  Administered 2015-06-14: 1000 mg via INTRAVENOUS
  Filled 2015-06-14: qty 8

## 2015-06-21 NOTE — Progress Notes (Signed)
[place]  Telephone:(336) [phone] Fax:(336) [phone]  ID: Larry [name] OB: [birth year]  MR#: [id number]  [id number]  Patient Care Team: Shepard General, MD as PCP - General (General Practice)  CHIEF COMPLAINT:  Chief Complaint  Patient presents with  . Nephritis    membranous glomerulonephritis    INTERVAL HISTORY: Patient returns to clinic for further evaluation and his third cycle of IV Solu-Medrol 3 days for membranous glomerulonephritis. He continues to tolerate his oral Cytoxan well without significant side effects. Currently, he feels well and is asymptomatic. He denies any recent fevers or illnesses. He denies any chest pain or shortness of breath. He has no urinary complaints. Patient offers no specific complaints today.  REVIEW OF SYSTEMS:   Review of Systems  Constitutional: Negative.   Gastrointestinal: Negative.   Genitourinary: Negative.     As per HPI. Otherwise, a complete review of systems is negatve.  PAST MEDICAL HISTORY: Past Medical History  Diagnosis Date  . Hypertension   . Chronic kidney disease     PAST SURGICAL HISTORY: Past Surgical History  Procedure Laterality Date  . Cholecystectomy    . Renal biopsy      FAMILY HISTORY Family History  Problem Relation Age of Onset  . Diabetes Mother   . Kidney disease Mother     mother on dialysis  . CAD Mother   . Diabetes Sister   . CAD Sister        ADVANCED DIRECTIVES:    HEALTH MAINTENANCE: Social History  Substance Use Topics  . Smoking status: Current Every Day Smoker -- 1.00 packs/day for 20 years    Types: Cigarettes  . Smokeless tobacco: Not on file  . Alcohol Use: Yes     Comment: occ. alcohol use     Colonoscopy:  PAP:  Bone density:  Lipid panel:  Allergies  Allergen Reactions  . Amoxicillin Nausea And Vomiting    Current Outpatient Prescriptions  Medication Sig Dispense Refill  . acetaminophen (TYLENOL) 325 MG tablet Take 650 mg by  mouth every 4 (four) hours as needed. As needed mild pain or temp greater than 100.4    . amLODipine (NORVASC) 10 MG tablet Take 10 mg by mouth daily.    . hydrALAZINE (APRESOLINE) 25 MG tablet Take 25 mg by mouth 4 (four) times daily.    Marland Kitchen METOPROLOL SUCCINATE ER PO Take by mouth.     No current facility-administered medications for this visit.   Facility-Administered Medications Ordered in Other Visits  Medication Dose Route Frequency Provider Last Rate Last Dose  . 0.9 %  sodium chloride infusion   Intravenous Continuous [name], MD 10 mL/hr at 06/14/15 1412      OBJECTIVE: Filed Vitals:   06/12/15 1408  BP: 166/86  Pulse: 69  Temp: 98 F (36.7 C)     Body mass index is 26.26 kg/(m^2).    ECOG FS:0 - Asymptomatic  General: Well-developed, well-nourished, no acute distress. Eyes: Pink conjunctiva, anicteric sclera. Lungs: Clear to auscultation bilaterally. Heart: Regular rate and rhythm. No rubs, murmurs, or gallops. Abdomen: Soft, nontender, nondistended. No organomegaly noted, normoactive bowel sounds. Musculoskeletal: No edema, cyanosis, or clubbing. Neuro: Alert, answering all questions appropriately. Cranial nerves grossly intact. Skin: No rashes or petechiae noted. Psych: Normal affect.   LAB RESULTS:  Lab Results  Component Value Date   NA 138 01/19/2015   K 3.9 01/19/2015   CL 114* 01/19/2015   CO2 18* 01/19/2015   GLUCOSE 95 01/19/2015  BUN 48* 01/19/2015   CREATININE 5.66* 01/19/2015   CALCIUM 8.0* 01/19/2015   PROT 6.0* 01/01/2015   ALBUMIN 2.6* 01/01/2015   AST 23 01/01/2015   ALT 18 01/01/2015   ALKPHOS 65 01/01/2015   BILITOT <0.1* 01/01/2015   GFRNONAA 11* 01/19/2015   GFRAA 13* 01/19/2015    Lab Results  Component Value Date   WBC 9.3 01/19/2015   NEUTROABS 6.4 01/19/2015   HGB 9.9* 01/19/2015   HCT 30.1* 01/19/2015   MCV 93 01/19/2015   PLT 293 01/19/2015     STUDIES: No results found.  ASSESSMENT: Membranous  glomerulonephritis  PLAN:    1. Membranous glomerulonephritis: Proceed with month 5 of IV Solu-Medrol.  Patient will receive oral Cytoxan from nephrology. He will receive 1 g IV Solu-Medrol on days 1, 2, and 3. He will receive this IV during months 1, 3, and 5. All his laboratory work will be monitored by nephrology. Because membranous glomerulonephritis can be related to an underlying malignancy, a CT scan was previously ordered. This was reviewed independently and revealed no evidence of disease. Patient has now completed his IV Solu-Medrol portion of his treatment. No follow-up has been scheduled. Patient has been instructed to call clinic if any further treatments are necessary.   Patient expressed understanding and was in agreement with this plan. He also understands that He can call clinic at any time with any questions, concerns, or complaints.    [name], MD   06/21/2015 12:40 PM

## 2015-11-22 ENCOUNTER — Inpatient Hospital Stay: Payer: BLUE CROSS/BLUE SHIELD | Attending: Internal Medicine

## 2015-12-24 ENCOUNTER — Encounter: Payer: Self-pay | Admitting: Emergency Medicine

## 2015-12-24 ENCOUNTER — Emergency Department: Payer: BLUE CROSS/BLUE SHIELD

## 2015-12-24 DIAGNOSIS — R079 Chest pain, unspecified: Secondary | ICD-10-CM | POA: Insufficient documentation

## 2015-12-24 DIAGNOSIS — Z79899 Other long term (current) drug therapy: Secondary | ICD-10-CM | POA: Insufficient documentation

## 2015-12-24 DIAGNOSIS — R112 Nausea with vomiting, unspecified: Secondary | ICD-10-CM | POA: Insufficient documentation

## 2015-12-24 DIAGNOSIS — R918 Other nonspecific abnormal finding of lung field: Secondary | ICD-10-CM | POA: Diagnosis not present

## 2015-12-24 DIAGNOSIS — N189 Chronic kidney disease, unspecified: Secondary | ICD-10-CM | POA: Insufficient documentation

## 2015-12-24 DIAGNOSIS — E860 Dehydration: Secondary | ICD-10-CM | POA: Diagnosis not present

## 2015-12-24 DIAGNOSIS — R0602 Shortness of breath: Secondary | ICD-10-CM | POA: Insufficient documentation

## 2015-12-24 DIAGNOSIS — R1031 Right lower quadrant pain: Secondary | ICD-10-CM | POA: Diagnosis not present

## 2015-12-24 DIAGNOSIS — F17210 Nicotine dependence, cigarettes, uncomplicated: Secondary | ICD-10-CM | POA: Insufficient documentation

## 2015-12-24 DIAGNOSIS — I129 Hypertensive chronic kidney disease with stage 1 through stage 4 chronic kidney disease, or unspecified chronic kidney disease: Secondary | ICD-10-CM | POA: Insufficient documentation

## 2015-12-24 LAB — BASIC METABOLIC PANEL
ANION GAP: 6 (ref 5–15)
BUN: 60 mg/dL — ABNORMAL HIGH (ref 6–20)
CALCIUM: 8.3 mg/dL — AB (ref 8.9–10.3)
CO2: 13 mmol/L — AB (ref 22–32)
CREATININE: 3.78 mg/dL — AB (ref 0.61–1.24)
Chloride: 117 mmol/L — ABNORMAL HIGH (ref 101–111)
GFR calc Af Amer: 20 mL/min — ABNORMAL LOW (ref >60)
GFR, EST NON AFRICAN AMERICAN: 17 mL/min — AB (ref >60)
GLUCOSE: 114 mg/dL — AB (ref 65–99)
Potassium: 4.9 mmol/L (ref 3.5–5.1)
Sodium: 136 mmol/L (ref 135–145)

## 2015-12-24 LAB — CBC
HCT: 38.5 % — ABNORMAL LOW (ref 40.0–52.0)
HEMOGLOBIN: 12.9 g/dL — AB (ref 13.0–18.0)
MCH: 30.5 pg (ref 26.0–34.0)
MCHC: 33.4 g/dL (ref 32.0–36.0)
MCV: 91.4 fL (ref 80.0–100.0)
PLATELETS: 235 10*3/uL (ref 150–440)
RBC: 4.22 MIL/uL — ABNORMAL LOW (ref 4.40–5.90)
RDW: 14.5 % (ref 11.5–14.5)
WBC: 10.2 10*3/uL (ref 3.8–10.6)

## 2015-12-24 LAB — TROPONIN I

## 2015-12-24 LAB — LIPASE, BLOOD: LIPASE: 52 U/L — AB (ref 11–51)

## 2015-12-24 MED ORDER — ONDANSETRON HCL 4 MG/2ML IJ SOLN
4.0000 mg | Freq: Once | INTRAMUSCULAR | Status: AC | PRN
  Administered 2015-12-24: 4 mg via INTRAVENOUS

## 2015-12-24 MED ORDER — ONDANSETRON HCL 4 MG/2ML IJ SOLN
INTRAMUSCULAR | Status: AC
  Administered 2015-12-24: 4 mg via INTRAVENOUS
  Filled 2015-12-24: qty 2

## 2015-12-24 MED ORDER — ONDANSETRON HCL 4 MG/2ML IJ SOLN
INTRAMUSCULAR | Status: AC
  Filled 2015-12-24: qty 2

## 2015-12-24 NOTE — ED Notes (Signed)
Pt presents to ED with epigastric pain that radiates in to his chest with frequent vomiting and diarrhea. Pt states every time he vomits he has a bowel movement. C/o shortness of breath and dizziness. Vomiting during triage.

## 2015-12-24 NOTE — ED Notes (Signed)
Pt vomiting, meds ordered

## 2015-12-25 ENCOUNTER — Emergency Department: Payer: BLUE CROSS/BLUE SHIELD

## 2015-12-25 ENCOUNTER — Emergency Department
Admission: EM | Admit: 2015-12-25 | Discharge: 2015-12-25 | Disposition: A | Discharge status: Home / Self Care | Payer: BLUE CROSS/BLUE SHIELD | Attending: Emergency Medicine | Admitting: Emergency Medicine

## 2015-12-25 DIAGNOSIS — R1031 Right lower quadrant pain: Secondary | ICD-10-CM

## 2015-12-25 DIAGNOSIS — E860 Dehydration: Secondary | ICD-10-CM

## 2015-12-25 LAB — URINALYSIS COMPLETE WITH MICROSCOPIC (ARMC ONLY)
Bilirubin Urine: NEGATIVE
GLUCOSE, UA: 50 mg/dL — AB
Ketones, ur: NEGATIVE mg/dL
LEUKOCYTES UA: NEGATIVE
Nitrite: NEGATIVE
PH: 5 (ref 5.0–8.0)
SPECIFIC GRAVITY, URINE: 1.016 (ref 1.005–1.030)

## 2015-12-25 MED ORDER — PROCHLORPERAZINE EDISYLATE 5 MG/ML IJ SOLN
10.0000 mg | Freq: Once | INTRAMUSCULAR | Status: AC
  Administered 2015-12-25: 10 mg via INTRAVENOUS
  Filled 2015-12-25: qty 2

## 2015-12-25 MED ORDER — PROMETHAZINE HCL 12.5 MG RE SUPP: 12.5000 mg | Freq: Four times a day (QID) | RECTAL | Status: DC | PRN

## 2015-12-25 MED ORDER — PROMETHAZINE HCL 25 MG PO TABS: 25.0000 mg | ORAL_TABLET | Freq: Four times a day (QID) | ORAL | Status: DC | PRN

## 2015-12-25 MED ORDER — AZITHROMYCIN 250 MG PO TABS: ORAL_TABLET | ORAL | Status: DC

## 2015-12-25 MED ORDER — LEVOFLOXACIN IN D5W 750 MG/150ML IV SOLN
750.0000 mg | INTRAVENOUS | Status: DC
  Administered 2015-12-25: 750 mg via INTRAVENOUS
  Filled 2015-12-25: qty 150

## 2015-12-25 MED ORDER — SODIUM CHLORIDE 0.9 % IV BOLUS (SEPSIS)
500.0000 mL | Freq: Once | INTRAVENOUS | Status: AC
  Administered 2015-12-25: 500 mL via INTRAVENOUS

## 2015-12-25 NOTE — Discharge Instructions (Signed)
Dehydration, Adult °Dehydration is a condition in which you do not have enough fluid or water in your body. It happens when you take in less fluid than you lose. Vital organs such as the kidneys, brain, and heart cannot function without a proper amount of fluids. Any loss of fluids from the body can cause dehydration.  °Dehydration can range from mild to severe. This condition should be treated right away to help prevent it from becoming severe. °CAUSES  °This condition may be caused by: °· Vomiting. °· Diarrhea. °· Excessive sweating, such as when exercising in hot or humid weather. °· Not drinking enough fluid during strenuous exercise or during an illness. °· Excessive urine output. °· Fever. °· Certain medicines. °RISK FACTORS °This condition is more likely to develop in: °· People who are taking certain medicines that cause the body to lose excess fluid (diuretics).   °· People who have a chronic illness, such as diabetes, that may increase urination. °· Older adults.   °· People who live at high altitudes.   °· People who participate in endurance sports.   °SYMPTOMS  °Mild Dehydration °· Thirst. °· Dry lips. °· Slightly dry mouth. °· Dry, warm skin. °Moderate Dehydration °· Very dry mouth.   °· Muscle cramps.   °· Dark urine and decreased urine production.   °· Decreased tear production.   °· Headache.   °· Light-headedness, especially when you stand up from a sitting position.   °Severe Dehydration °· Changes in skin.   °¨ Cold and clammy skin.   °¨ Skin does not spring back quickly when lightly pinched and released.   °· Changes in body fluids.   °¨ Extreme thirst.   °¨ No tears.   °¨ Not able to sweat when body temperature is high, such as in hot weather.   °¨ Minimal urine production.   °· Changes in vital signs.   °¨ Rapid, weak pulse (more than 100 beats per minute when you are sitting still).   °¨ Rapid breathing.   °¨ Low blood pressure.   °· Other changes.   °¨ Sunken eyes.   °¨ Cold hands and feet.    °¨ Confusion. °¨ Lethargy and difficulty being awakened. °¨ Fainting (syncope).   °¨ Short-term weight loss.   °¨ Unconsciousness. °DIAGNOSIS  °This condition may be diagnosed based on your symptoms. You may also have tests to determine how severe your dehydration is. These tests may include:  °· Urine tests.   °· Blood tests.   °TREATMENT  °Treatment for this condition depends on the severity. Mild or moderate dehydration can often be treated at home. Treatment should be started right away. Do not wait until dehydration becomes severe. Severe dehydration needs to be treated at the hospital. °Treatment for Mild Dehydration °· Drinking plenty of water to replace the fluid you have lost.   °· Replacing minerals in your blood (electrolytes) that you may have lost.   °Treatment for Moderate Dehydration  °· Consuming oral rehydration solution (ORS). °Treatment for Severe Dehydration °· Receiving fluid through an IV tube.   °· Receiving electrolyte solution through a feeding tube that is passed through your nose and into your stomach (nasogastric tube or NG tube). °· Correcting any abnormalities in electrolytes. °HOME CARE INSTRUCTIONS  °· Drink enough fluid to keep your urine clear or pale yellow.   °· Drink water or fluid slowly by taking small sips. You can also try sucking on ice cubes.  °· Have food or beverages that contain electrolytes. Examples include bananas and sports drinks. °· Take over-the-counter and prescription medicines only as told by your health care provider.   °· Prepare ORS according to the manufacturer's instructions. Take sips   of ORS every 5 minutes until your urine returns to normal.  If you have vomiting or diarrhea, continue to try to drink water, ORS, or both.   If you have diarrhea, avoid:   Beverages that contain caffeine.   Fruit juice.   Milk.   Carbonated soft drinks.  Do not take salt tablets. This can lead to the condition of having too much sodium in your body  (hypernatremia).  SEEK MEDICAL CARE IF:  You cannot eat or drink without vomiting.  You have had moderate diarrhea during a period of more than 24 hours.  You have a fever. SEEK IMMEDIATE MEDICAL CARE IF:   You have extreme thirst.  You have severe diarrhea.  You have not urinated in 6-8 hours, or you have urinated only a small amount of very dark urine.  You have shriveled skin.  You are dizzy, confused, or both.   This information is not intended to replace advice given to you by your health care provider. Make sure you discuss any questions you have with your health care provider.   Document Released: 09/08/2005 Document Revised: 05/30/2015 Document Reviewed: 01/24/2015 Elsevier Interactive Patient Education [employer].   Please return immediately if condition worsens. Please contact her primary physician or the physician you were given for referral. If you have any specialist physicians involved in her treatment and plan please also contact them. Thank you for using [facility]. Your presentation most likely is from some gastroenteritis especially given the acute onset of your nausea, vomiting and diarrhea. This is usually a viral syndrome. Return to the emergency department especially if he develop a fever, bloody diarrhea, uncontrolled vomiting, or any other new concerns. Your chest x-ray showed some mild findings of possible community-acquired pneumonia. Return if he develops shortness of breath or increasing chest discomfort

## 2015-12-25 NOTE — ED Provider Notes (Signed)
Time Seen: Approximately 0-10  I have reviewed the triage notes  Chief Complaint: Abdominal Pain; Chest Pain; Shortness of Breath; and Emesis   History of Present Illness: Larry [name] is a 50 y.o. male who presents with acute onset of nausea, vomiting, and loose watery stool that started yesterday afternoon. The patient states he vomited multiple times and at same time had multiple episodes of loose watery stool. He describes some diffuse crampy abdominal pain. He also states some mild shortness of breath without any chest discomfort. Not aware of any fever or productive nature to his cough. He denies any melena or hematochezia. Denies any hematemesis or biliary emesis. He received Zofran to triage area and states he felt symptomatically improved but still has some persistent nausea   Past Medical History  Diagnosis Date  . Hypertension   . Chronic kidney disease     Patient Active Problem List   Diagnosis Date Noted  . even 1 per today and did not I would not with an order 02/07/2015    Past Surgical History  Procedure Laterality Date  . Cholecystectomy    . Renal biopsy      Past Surgical History  Procedure Laterality Date  . Cholecystectomy    . Renal biopsy      Current Outpatient Rx  Name  Route  Sig  Dispense  Refill  . acetaminophen (TYLENOL) 325 MG tablet   Oral   Take 650 mg by mouth every 4 (four) hours as needed. As needed mild pain or temp greater than 100.4         . amLODipine (NORVASC) 10 MG tablet   Oral   Take 10 mg by mouth daily.         . hydrALAZINE (APRESOLINE) 25 MG tablet   Oral   Take 25 mg by mouth 4 (four) times daily.         Marland Kitchen METOPROLOL SUCCINATE ER PO   Oral   Take by mouth.           Allergies:  Amoxicillin  Family History: Family History  Problem Relation Age of Onset  . Diabetes Mother   . Kidney disease Mother     mother on dialysis  . CAD Mother   . Diabetes Sister   . CAD Sister     Social  History: Social History  Substance Use Topics  . Smoking status: Current Every Day Smoker -- 1.00 packs/day for 20 years    Types: Cigarettes  . Smokeless tobacco: None  . Alcohol Use: Yes     Comment: occ. alcohol use     Review of Systems:   10 point review of systems was performed and was otherwise negative:  Constitutional: No fever Eyes: No visual disturbances ENT: No sore throat, ear pain Cardiac: No chest pain Respiratory: Mild shortness of breath Abdomen: Abdominal pain is rather diffuse and he points mainly to the. Umbilical area Endocrine: No weight loss, No night sweats Extremities: No peripheral edema, cyanosis Skin: No rashes, easy bruising Neurologic: No focal weakness, trouble with speech or swollowing Urologic: No dysuria, Hematuria, or urinary frequency   Physical Exam:  ED Triage Vitals  Enc Vitals Group     BP 12/24/15 2104 173/107 mmHg     Pulse Rate 12/24/15 2104 99     Resp 12/24/15 2104 28     Temp 12/24/15 2104 98 F (36.7 C)     Temp Source 12/24/15 2104 Oral     SpO2  12/24/15 2104 99 %     Weight 12/24/15 2104 155 lb (70.308 kg)     Height 12/24/15 2104 5\' 6"  (1.676 m)     Head Cir --      Peak Flow --      Pain Score 12/24/15 2115 9     Pain Loc --      Pain Edu? --      Excl. in [place]? --     General: Awake , Alert , and Oriented times 3; GCS 15 Head: Normal cephalic , atraumatic Eyes: Pupils equal , round, reactive to light Nose/Throat: No nasal drainage, patent upper airway without erythema or exudate.  Neck: Supple, Full range of motion, No anterior adenopathy or palpable thyroid masses Lungs: Clear to ascultation without wheezes , rhonchi, or rales Heart: Regular rate, regular rhythm without murmurs , gallops , or rubs Abdomen:Diffuse tenderness without rebound, guarding , or rigidity; bowel sounds positive and symmetric in all 4 quadrants. No organomegaly .      No obvious focal discomfort over McBurney's point  Extremities: 2  plus symmetric pulses. No edema, clubbing or cyanosis Neurologic: normal ambulation, Motor symmetric without deficits, sensory intact Skin: warm, dry, no rashes   Labs:   All laboratory work was reviewed including any pertinent negatives or positives listed below:  Labs Reviewed  BASIC METABOLIC PANEL - Abnormal; Notable for the following:    Chloride 117 (*)    CO2 13 (*)    Glucose, Bld 114 (*)    BUN 60 (*)    Creatinine, Ser 3.78 (*)    Calcium 8.3 (*)    GFR calc non Af Amer 17 (*)    GFR calc Af Amer 20 (*)    All other components within normal limits  CBC - Abnormal; Notable for the following:    RBC 4.22 (*)    Hemoglobin 12.9 (*)    HCT 38.5 (*)    All other components within normal limits  LIPASE, BLOOD - Abnormal; Notable for the following:    Lipase 52 (*)    All other components within normal limits  TROPONIN I  URINALYSIS COMPLETEWITH MICROSCOPIC ([facility] ONLY)   Patient has a history of renal insufficiency reflected in his laboratory work. No obvious acute abnormalities are noted EKG:  ED ECG REPORT I, Daymon Larsen, the attending physician, personally viewed and interpreted this ECG.  Date: 12/25/2015 EKG Time: 2112 Rate: 93 Rhythm: normal sinus rhythm QRS Axis: normal Intervals: normal ST/T Wave abnormalities: normal Conduction Disturbances: none Narrative Interpretation: unremarkable No acute ischemic change   Radiology  EXAM: CHEST 2 VIEW  COMPARISON: Prior study from 06/06/2013.  FINDINGS: Cardiac and mediastinal silhouettes are within normal limits.  Lungs normally inflated. Mild patchy opacity within the lingula on frontal projection. Finding may reflect atelectasis or infiltrate. No other focal airspace disease. No pneumothorax. No pulmonary edema or pleural effusion.  No acute osseous abnormality.  IMPRESSION: Small focal patchy opacity within the lingula, which may reflect atelectasis or infiltrate. No other active  cardiopulmonary disease.    I personally reviewed the radiologic studies    ED Course: Since labs. Be at baseline and the patient's otherwise hemodynamically stable and symptomatically improved with IV fluids and anti-medic therapy. Patient I felt could be discharged with further outpatient therapy with anti-medic medications. Skin advised to advance his diet as tolerated and return here especially develops a fever, bloody diarrhea, increased abdominal pain or any other new concerns. I felt this was  unlikely to be a surgical abdomen such as a bowel obstruction or perforation. It was noted that he may have a small focal opacity in the lingula and he was given IV antibiotics here in emergency Department be discharged on Zithromax.    Assessment: * Viral gastroenteritis Possible early community-acquired pneumonia  Final Clinical Impression:   Final diagnoses:  Right lower quadrant pain     Plan: * Outpatient management Patient was advised to return immediately if condition worsens. Patient was advised to follow up with their primary care physician or other specialized physicians involved in their outpatient care. The patient and/or family member/power of attorney had laboratory results reviewed at the bedside. All questions and concerns were addressed and appropriate discharge instructions were distributed by the nursing staff.            Daymon Larsen, MD 12/28/15 [phone]

## 2015-12-25 NOTE — ED Notes (Signed)
Discharge instructions reviewed with patient. Patient verbalized understanding. Patient ambulated to lobby without difficulty.   

## 2015-12-25 NOTE — ED Notes (Signed)
PO challenge initiated. Pt given fluids PO.

## 2016-01-11 DIAGNOSIS — N2581 Secondary hyperparathyroidism of renal origin: Secondary | ICD-10-CM | POA: Diagnosis not present

## 2016-01-11 DIAGNOSIS — N185 Chronic kidney disease, stage 5: Secondary | ICD-10-CM | POA: Diagnosis not present

## 2016-01-11 DIAGNOSIS — N184 Chronic kidney disease, stage 4 (severe): Secondary | ICD-10-CM | POA: Diagnosis not present

## 2016-01-11 DIAGNOSIS — I10 Essential (primary) hypertension: Secondary | ICD-10-CM | POA: Diagnosis not present

## 2016-01-11 DIAGNOSIS — N022 Recurrent and persistent hematuria with diffuse membranous glomerulonephritis: Secondary | ICD-10-CM | POA: Diagnosis not present

## 2016-01-11 DIAGNOSIS — R809 Proteinuria, unspecified: Secondary | ICD-10-CM | POA: Diagnosis not present

## 2016-02-26 DIAGNOSIS — N022 Recurrent and persistent hematuria with diffuse membranous glomerulonephritis: Secondary | ICD-10-CM | POA: Diagnosis not present

## 2016-02-26 DIAGNOSIS — R809 Proteinuria, unspecified: Secondary | ICD-10-CM | POA: Diagnosis not present

## 2016-02-26 DIAGNOSIS — I10 Essential (primary) hypertension: Secondary | ICD-10-CM | POA: Diagnosis not present

## 2016-02-26 DIAGNOSIS — N184 Chronic kidney disease, stage 4 (severe): Secondary | ICD-10-CM | POA: Diagnosis not present

## 2016-02-26 DIAGNOSIS — N2581 Secondary hyperparathyroidism of renal origin: Secondary | ICD-10-CM | POA: Diagnosis not present

## 2016-04-24 DIAGNOSIS — I10 Essential (primary) hypertension: Secondary | ICD-10-CM | POA: Diagnosis not present

## 2016-04-24 DIAGNOSIS — N184 Chronic kidney disease, stage 4 (severe): Secondary | ICD-10-CM | POA: Diagnosis not present

## 2016-04-24 DIAGNOSIS — N022 Recurrent and persistent hematuria with diffuse membranous glomerulonephritis: Secondary | ICD-10-CM | POA: Diagnosis not present

## 2016-04-24 DIAGNOSIS — N2581 Secondary hyperparathyroidism of renal origin: Secondary | ICD-10-CM | POA: Diagnosis not present

## 2016-05-06 ENCOUNTER — Telehealth: Payer: Self-pay | Admitting: *Deleted

## 2016-05-06 NOTE — Telephone Encounter (Deleted)
Called patient to inform her of lab results.  Patient verbalized understanding and grateful for call.

## 2016-05-07 NOTE — Progress Notes (Deleted)
[facility]  [phone] [phone]  ID: Larry [name] OB: 16-Dec-1965  MR#: [id number]  [id number]  Patient Care Team: [facility], MD as PCP - General (General Practice)  CHIEF COMPLAINT:  No chief complaint on file.   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 He continues to tolerate his oral Cytoxan well without significant side effects. Currently, he feels well and is asymptomatic. He denies any recent fevers or illnesses. He denies any chest pain or shortness of breath. He has no urinary complaints. Patient offers no specific complaints today.  REVIEW OF SYSTEMS:   Review of Systems  Constitutional: Negative.   Gastrointestinal: Negative.   Genitourinary: Negative.     As per HPI. Otherwise, a complete review of systems is negatve.  PAST MEDICAL HISTORY: Past Medical History:  Diagnosis Date  . Chronic kidney disease   . Hypertension     PAST SURGICAL HISTORY: Past Surgical History:  Procedure Laterality Date  . CHOLECYSTECTOMY    . RENAL BIOPSY      FAMILY HISTORY Family History  Problem Relation Age of Onset  . Diabetes Mother   . Kidney disease Mother     mother on dialysis  . CAD Mother   . Diabetes Sister   . CAD Sister        ADVANCED DIRECTIVES:    HEALTH MAINTENANCE: Social History  Substance Use Topics  . Smoking status: Current Every Day Smoker    Packs/day: 1.00    Years: 20.00    Types: Cigarettes  . Smokeless tobacco: Not on file  . Alcohol use Yes     Comment: occ. alcohol use     Colonoscopy:  PAP:  Bone density:  Lipid panel:  Allergies  Allergen Reactions  . Amoxicillin Nausea And Vomiting    Current Outpatient Prescriptions  Medication Sig Dispense Refill  . acetaminophen (TYLENOL) 325 MG tablet Take 650 mg by mouth every 4 (four) hours as needed. As needed mild  pain or temp greater than 100.4    . amLODipine (NORVASC) 10 MG tablet Take 10 mg by mouth daily.    Marland Kitchen azithromycin (ZITHROMAX Z-PAK) 250 MG tablet Take 2 tablets (500 mg) on  Day 1,  followed by 1 tablet (250 mg) once daily on Days 2 through 5. 6 each 0  . hydrALAZINE (APRESOLINE) 25 MG tablet Take 25 mg by mouth 4 (four) times daily.    Marland Kitchen METOPROLOL SUCCINATE ER PO Take by mouth.    . promethazine (PHENERGAN) 12.5 MG suppository Place 1 suppository (12.5 mg total) rectally every 6 (six) hours as needed for nausea or vomiting. 12 each 0  . promethazine (PHENERGAN) 25 MG tablet Take 1 tablet (25 mg total) by mouth every 6 (six) hours as needed for nausea or vomiting. 30 tablet 0   No current facility-administered medications for this visit.    Facility-Administered Medications Ordered in Other Visits  Medication Dose Route Frequency Provider Last Rate Last Dose  . 0.9 %  sodium chloride infusion   Intravenous Continuous [name], MD        OBJECTIVE: There were no vitals filed for this visit.   There is no height or weight on file to calculate BMI.    ECOG FS:0 - Asymptomatic  General: Well-developed, well-nourished, no acute distress. Eyes: Pink conjunctiva, anicteric sclera. Lungs: Clear to auscultation bilaterally. Heart: Regular rate and rhythm. No rubs,  murmurs, or gallops. Abdomen: Soft, nontender, nondistended. No organomegaly noted, normoactive bowel sounds. Musculoskeletal: No edema, cyanosis, or clubbing. Neuro: Alert, answering all questions appropriately. Cranial nerves grossly intact. Skin: No rashes or petechiae noted. Psych: Normal affect.   LAB RESULTS:  Lab Results  Component Value Date   NA 136 12/24/2015   K 4.9 12/24/2015   CL 117 (H) 12/24/2015   CO2 13 (L) 12/24/2015   GLUCOSE 114 (H) 12/24/2015   BUN 60 (H) 12/24/2015   CREATININE 3.78 (H) 12/24/2015   CALCIUM 8.3 (L) 12/24/2015   PROT 6.0 (L) 01/01/2015   ALBUMIN 2.6 (L) 01/01/2015   AST 23  01/01/2015   ALT 18 01/01/2015   ALKPHOS 65 01/01/2015   BILITOT <0.1 (L) 01/01/2015   GFRNONAA 17 (L) 12/24/2015   GFRAA 20 (L) 12/24/2015    Lab Results  Component Value Date   WBC 10.2 12/24/2015   NEUTROABS 6.4 01/19/2015   HGB 12.9 (L) 12/24/2015   HCT 38.5 (L) 12/24/2015   MCV 91.4 12/24/2015   PLT 235 12/24/2015     STUDIES: No results found.  ASSESSMENT: Membranous glomerulonephritis  PLAN:    1. Membranous glomerulonephritis: Proceed with month 5 of IV Solu-Medrol.  Patient will receive oral Cytoxan from nephrology. He will receive 1 g IV Solu-Medrol on days 1, 2, and 3. He will receive this IV during months 1, 3, and 5. All his laboratory work will be monitored by nephrology. Because membranous glomerulonephritis can be related to an underlying malignancy, a CT scan was previously ordered. This was reviewed independently and revealed no evidence of disease. Patient has now completed his IV Solu-Medrol portion of his treatment. No follow-up has been scheduled. Patient has been instructed to call clinic if any further treatments are necessary.   Patient expressed understanding and was in agreement with this plan. He also understands that He can call clinic at any time with any questions, concerns, or complaints.    [name], MD   05/07/2016 12:17 AM

## 2016-05-08 ENCOUNTER — Inpatient Hospital Stay: Payer: BLUE CROSS/BLUE SHIELD | Admitting: Internal Medicine

## 2016-05-08 ENCOUNTER — Inpatient Hospital Stay: Payer: BLUE CROSS/BLUE SHIELD

## 2016-05-08 ENCOUNTER — Inpatient Hospital Stay: Payer: BLUE CROSS/BLUE SHIELD | Admitting: Oncology

## 2016-05-21 NOTE — Telephone Encounter (Signed)
Erroneous entry

## 2016-06-08 ENCOUNTER — Emergency Department: Payer: BLUE CROSS/BLUE SHIELD

## 2016-06-08 ENCOUNTER — Encounter: Payer: Self-pay | Admitting: Emergency Medicine

## 2016-06-08 ENCOUNTER — Emergency Department
Admission: EM | Admit: 2016-06-08 | Discharge: 2016-06-08 | Disposition: A | Discharge status: Home / Self Care | Payer: BLUE CROSS/BLUE SHIELD | Attending: Emergency Medicine | Admitting: Emergency Medicine

## 2016-06-08 DIAGNOSIS — Y9289 Other specified places as the place of occurrence of the external cause: Secondary | ICD-10-CM | POA: Diagnosis not present

## 2016-06-08 DIAGNOSIS — Z79899 Other long term (current) drug therapy: Secondary | ICD-10-CM | POA: Insufficient documentation

## 2016-06-08 DIAGNOSIS — Y9389 Activity, other specified: Secondary | ICD-10-CM | POA: Insufficient documentation

## 2016-06-08 DIAGNOSIS — F10920 Alcohol use, unspecified with intoxication, uncomplicated: Secondary | ICD-10-CM

## 2016-06-08 DIAGNOSIS — F17210 Nicotine dependence, cigarettes, uncomplicated: Secondary | ICD-10-CM | POA: Insufficient documentation

## 2016-06-08 DIAGNOSIS — S199XXA Unspecified injury of neck, initial encounter: Secondary | ICD-10-CM | POA: Diagnosis not present

## 2016-06-08 DIAGNOSIS — S0990XA Unspecified injury of head, initial encounter: Secondary | ICD-10-CM | POA: Insufficient documentation

## 2016-06-08 DIAGNOSIS — F10120 Alcohol abuse with intoxication, uncomplicated: Secondary | ICD-10-CM | POA: Diagnosis not present

## 2016-06-08 DIAGNOSIS — N189 Chronic kidney disease, unspecified: Secondary | ICD-10-CM | POA: Insufficient documentation

## 2016-06-08 DIAGNOSIS — Y999 Unspecified external cause status: Secondary | ICD-10-CM | POA: Insufficient documentation

## 2016-06-08 DIAGNOSIS — W19XXXA Unspecified fall, initial encounter: Secondary | ICD-10-CM | POA: Diagnosis not present

## 2016-06-08 DIAGNOSIS — I129 Hypertensive chronic kidney disease with stage 1 through stage 4 chronic kidney disease, or unspecified chronic kidney disease: Secondary | ICD-10-CM | POA: Insufficient documentation

## 2016-06-08 DIAGNOSIS — R112 Nausea with vomiting, unspecified: Secondary | ICD-10-CM | POA: Diagnosis not present

## 2016-06-08 LAB — CBC
HEMATOCRIT: 38.9 % — AB (ref 40.0–52.0)
HEMOGLOBIN: 13.2 g/dL (ref 13.0–18.0)
MCH: 31.3 pg (ref 26.0–34.0)
MCHC: 33.8 g/dL (ref 32.0–36.0)
MCV: 92.7 fL (ref 80.0–100.0)
Platelets: 275 10*3/uL (ref 150–440)
RBC: 4.2 MIL/uL — ABNORMAL LOW (ref 4.40–5.90)
RDW: 14.6 % — ABNORMAL HIGH (ref 11.5–14.5)
WBC: 11.6 10*3/uL — AB (ref 3.8–10.6)

## 2016-06-08 LAB — COMPREHENSIVE METABOLIC PANEL
ALK PHOS: 65 U/L (ref 38–126)
ALT: 48 U/L (ref 17–63)
AST: 36 U/L (ref 15–41)
Albumin: 3.6 g/dL (ref 3.5–5.0)
Anion gap: 9 (ref 5–15)
BILIRUBIN TOTAL: 0.4 mg/dL (ref 0.3–1.2)
BUN: 61 mg/dL — AB (ref 6–20)
CALCIUM: 8.5 mg/dL — AB (ref 8.9–10.3)
CHLORIDE: 113 mmol/L — AB (ref 101–111)
CO2: 16 mmol/L — ABNORMAL LOW (ref 22–32)
CREATININE: 4.94 mg/dL — AB (ref 0.61–1.24)
GFR, EST AFRICAN AMERICAN: 15 mL/min — AB (ref >60)
GFR, EST NON AFRICAN AMERICAN: 13 mL/min — AB (ref >60)
Glucose, Bld: 109 mg/dL — ABNORMAL HIGH (ref 65–99)
Potassium: 4 mmol/L (ref 3.5–5.1)
Sodium: 138 mmol/L (ref 135–145)
Total Protein: 6.5 g/dL (ref 6.5–8.1)

## 2016-06-08 LAB — ETHANOL: Alcohol, Ethyl (B): 234 mg/dL — ABNORMAL HIGH (ref <5)

## 2016-06-08 LAB — LIPASE, BLOOD: Lipase: 58 U/L — ABNORMAL HIGH (ref 11–51)

## 2016-06-08 MED ORDER — SODIUM CHLORIDE 0.9 % IV BOLUS (SEPSIS)
1000.0000 mL | Freq: Once | INTRAVENOUS | Status: AC
  Administered 2016-06-08: 1000 mL via INTRAVENOUS

## 2016-06-08 MED ORDER — ONDANSETRON 4 MG PO TBDP: 4.0000 mg | ORAL_TABLET | Freq: Three times a day (TID) | ORAL | 0 refills | Status: DC | PRN

## 2016-06-08 MED ORDER — ONDANSETRON HCL 4 MG/2ML IJ SOLN
INTRAMUSCULAR | Status: AC
  Administered 2016-06-08: 4 mg via INTRAVENOUS
  Filled 2016-06-08: qty 2

## 2016-06-08 MED ORDER — ONDANSETRON HCL 4 MG/2ML IJ SOLN
4.0000 mg | Freq: Once | INTRAMUSCULAR | Status: AC | PRN
  Administered 2016-06-08: 4 mg via INTRAVENOUS

## 2016-06-08 NOTE — ED Notes (Signed)
Pt offered food and drink. Denied at this time.

## 2016-06-08 NOTE — ED Provider Notes (Signed)
-----------------------------------------   10:06 AM on 06/08/2016 -----------------------------------------  Patient is awake, alert, ambulating without difficulty, is eating. Patient will call a ride to pick him up from the emergency department. I discussed with the patient holding his blood pressure medications today and restarting tomorrow. The patient is agreeable to plan. He has no complaints at this time.   Harvest Dark, MD 06/08/16 1006

## 2016-06-08 NOTE — ED Provider Notes (Signed)
[facility] Emergency Department Provider Note   ____________________________________________   First MD Initiated Contact with Patient 06/08/16 [phone]     (approximate)  I have reviewed the triage vital signs and the nursing notes.   HISTORY  Chief Complaint Emesis and Alcohol Intoxication    HPI Larry [name] is a 50 y.o. male ho comes into the hospital today with vomiting. The patient was drinking at a bar and he reports that he started vomiting. The people who was with him told him that he may have hit his head but he doesn't think it happened. At the bar they thought the patient was vomiting blood so they decided to bring him into the hospital. The patient reports that he overdid it. He has some throat pain but he reports he has had this for over a year. He was drinking [place] iced teas and reports that he also took some shots. He denies any abdominal pain. He reports that his throat pain is the worst 8 out of 10 in intensity. The patient has some brownish appearing emesis tried to his face but reports that he is in no acute distress at this time.   Past Medical History:  Diagnosis Date  . Chronic kidney disease   . Hypertension     Patient Active Problem List   Diagnosis Date Noted  . even 1 per today and did not I would not with an order 02/07/2015    Past Surgical History:  Procedure Laterality Date  . CHOLECYSTECTOMY    . RENAL BIOPSY      Prior to Admission medications   Medication Sig Start Date End Date Taking? Authorizing Provider  acetaminophen (TYLENOL) 325 MG tablet Take 650 mg by mouth every 4 (four) hours as needed. As needed mild pain or temp greater than 100.4    Historical Provider, MD  amLODipine (NORVASC) 10 MG tablet Take 10 mg by mouth daily.    Historical Provider, MD  azithromycin (ZITHROMAX Z-PAK) 250 MG tablet Take 2 tablets (500 mg) on  Day 1,  followed by 1 tablet (250 mg) once daily on Days 2 through 5. 12/25/15    Daymon Larsen, MD  hydrALAZINE (APRESOLINE) 25 MG tablet Take 25 mg by mouth 4 (four) times daily.    Historical Provider, MD  METOPROLOL SUCCINATE ER PO Take by mouth.    Historical Provider, MD  promethazine (PHENERGAN) 12.5 MG suppository Place 1 suppository (12.5 mg total) rectally every 6 (six) hours as needed for nausea or vomiting. 12/25/15   Daymon Larsen, MD  promethazine (PHENERGAN) 25 MG tablet Take 1 tablet (25 mg total) by mouth every 6 (six) hours as needed for nausea or vomiting. 12/25/15   Daymon Larsen, MD    Allergies Amoxicillin  Family History  Problem Relation Age of Onset  . Diabetes Mother   . Kidney disease Mother     mother on dialysis  . CAD Mother   . Diabetes Sister   . CAD Sister     Social History Social History  Substance Use Topics  . Smoking status: Current Every Day Smoker    Packs/day: 1.00    Years: 20.00    Types: Cigarettes  . Smokeless tobacco: Never Used  . Alcohol use Yes     Comment: occ. alcohol use    Review of Systems Constitutional: No fever/chills Eyes: No visual changes. ENT: No sore throat. Cardiovascular: Denies chest pain. Respiratory: Denies shortness of breath. Gastrointestinal: nausea  and vomiting, No abdominal pain.   No diarrhea.  No constipation. Genitourinary: Negative for dysuria. Musculoskeletal: Negative for back pain. Skin: Negative for rash. Neurological: Negative for headaches, focal weakness or numbness.  10-point ROS otherwise negative.  ____________________________________________   PHYSICAL EXAM:  VITAL SIGNS: ED Triage Vitals  Enc Vitals Group     BP 06/08/16 0309 110/81     Pulse Rate 06/08/16 0309 79     Resp 06/08/16 0309 18     Temp --      Temp src --      SpO2 06/08/16 0309 95 %     Weight 06/08/16 0310 156 lb (70.8 kg)     Height 06/08/16 0310 5\' 6"  (1.676 m)     Head Circumference --      Peak Flow --      Pain Score --      Pain Loc --      Pain Edu? --      Excl. in [place]?  --     Constitutional: Alert and oriented but somnolent. Well appearing and in mild distress. Eyes: Conjunctivae are normal. PERRL. EOMI. Head: Atraumatic. Nose: No congestion/rhinnorhea. Mouth/Throat: Mucous membranes are moist.  Oropharynx non-erythematous. Neck: No cervical spine tenderness to palpation Cardiovascular: Normal rate, regular rhythm. Grossly normal heart sounds.  Good peripheral circulation. Respiratory: Normal respiratory effort.  No retractions. Lungs CTAB. Gastrointestinal: Soft and nontender. No distention. Positive bowel sounds Musculoskeletal: No lower extremity tenderness nor edema.   Neurologic:  Normal speech and language.  Skin:  Skin is warm, dry and intact.  Psychiatric: Mood and affect are normal.   ____________________________________________   LABS (all labs ordered are listed, but only abnormal results are displayed)  Labs Reviewed  LIPASE, BLOOD - Abnormal; Notable for the following:       Result Value   Lipase 58 (*)    All other components within normal limits  COMPREHENSIVE METABOLIC PANEL - Abnormal; Notable for the following:    Chloride 113 (*)    CO2 16 (*)    Glucose, Bld 109 (*)    BUN 61 (*)    Creatinine, Ser 4.94 (*)    Calcium 8.5 (*)    GFR calc non Af Amer 13 (*)    GFR calc Af Amer 15 (*)    All other components within normal limits  CBC - Abnormal; Notable for the following:    WBC 11.6 (*)    RBC 4.20 (*)    HCT 38.9 (*)    RDW 14.6 (*)    All other components within normal limits  ETHANOL - Abnormal; Notable for the following:    Alcohol, Ethyl (B) 234 (*)    All other components within normal limits   ____________________________________________  EKG  none ____________________________________________  RADIOLOGY  CT head  CT cervical spine ____________________________________________   PROCEDURES  Procedure(s) performed: None  Procedures  Critical Care performed:  No  ____________________________________________   INITIAL IMPRESSION / ASSESSMENT AND PLAN / ED COURSE  Pertinent labs & imaging results that were available during my care of the patient were reviewed by me and considered in my medical decision making (see chart for details).  This is a 50 year old male who comes into the hospital today with some vomiting and a possible head injury after drinking in a bar. The patient does have some brownish appearing emesis on his face as he is not actively vomiting and he denies any abdominal pain. We will do a CT  scan of the patient's head and cervical spine to evaluate for possible injury. We'll also give the patient a liter of normal saline. We'll reassess the patient once he has woken up some more.  Clinical Course  Value Comment By Time  CT Head Wo Contrast 1. No acute intracranial abnormality is identified. Normal CT of head. 2. No acute fracture or dislocation of the cervical spine.    Loney Hering, MD 09/17 [id number]   The patient is sleeping in no acute distress. The patient did have some brown stool in the rectal vault with some mildly heme positive on guaiac. The patient reports that he does sometimes have some bleeding when he has a bowel movement but nothing severe serious. The patient's bicarbonate is 16 but it has been low in the past and he does take sodium bicarbonate. We will monitor the patient contact his family to see if they're able to come pick him up. Once the patient is clinically sober he'll be able to be discharged. We will also have the patient drink and eat prior to discharge. The patient's H&H is unremarkable. The patient has received 2 L of normal saline. I did not want to fluid overload the patient given his renal failure. He will be discharged to home.  ____________________________________________   FINAL CLINICAL IMPRESSION(S) / ED DIAGNOSES  Final diagnoses:  Alcohol intoxication, uncomplicated (HCC)   Non-intractable vomiting with nausea, vomiting of unspecified type      NEW MEDICATIONS STARTED DURING THIS VISIT:  New Prescriptions   No medications on file     Note:  This document was prepared using Dragon voice recognition software and may include unintentional dictation errors.    Loney Hering, MD 06/08/16 [phone]

## 2016-06-08 NOTE — ED Notes (Signed)
Patient transported to CT 

## 2016-06-08 NOTE — ED Triage Notes (Signed)
Patient brought in by ems from home for nausea and vomiting. Patient reports drinking a large amount of etoh tonight.

## 2016-06-08 NOTE — ED Notes (Signed)
Pt ambulatory to bathroom without difficulty. Pt given snack and water. Calling for ride.

## 2016-07-03 DIAGNOSIS — N022 Recurrent and persistent hematuria with diffuse membranous glomerulonephritis: Secondary | ICD-10-CM | POA: Diagnosis not present

## 2016-07-03 DIAGNOSIS — E872 Acidosis: Secondary | ICD-10-CM | POA: Diagnosis not present

## 2016-07-03 DIAGNOSIS — N184 Chronic kidney disease, stage 4 (severe): Secondary | ICD-10-CM | POA: Diagnosis not present

## 2016-07-03 DIAGNOSIS — I10 Essential (primary) hypertension: Secondary | ICD-10-CM | POA: Diagnosis not present

## 2016-07-03 DIAGNOSIS — R809 Proteinuria, unspecified: Secondary | ICD-10-CM | POA: Diagnosis not present

## 2016-07-22 DIAGNOSIS — Z23 Encounter for immunization: Secondary | ICD-10-CM | POA: Diagnosis not present

## 2016-08-26 DIAGNOSIS — N2581 Secondary hyperparathyroidism of renal origin: Secondary | ICD-10-CM | POA: Diagnosis not present

## 2016-08-26 DIAGNOSIS — N184 Chronic kidney disease, stage 4 (severe): Secondary | ICD-10-CM | POA: Diagnosis not present

## 2016-08-26 DIAGNOSIS — N022 Recurrent and persistent hematuria with diffuse membranous glomerulonephritis: Secondary | ICD-10-CM | POA: Diagnosis not present

## 2016-08-26 DIAGNOSIS — I10 Essential (primary) hypertension: Secondary | ICD-10-CM | POA: Diagnosis not present

## 2016-09-17 DIAGNOSIS — H6692 Otitis media, unspecified, left ear: Secondary | ICD-10-CM | POA: Diagnosis not present

## 2016-09-17 DIAGNOSIS — J029 Acute pharyngitis, unspecified: Secondary | ICD-10-CM | POA: Diagnosis not present

## 2016-09-17 DIAGNOSIS — R1310 Dysphagia, unspecified: Secondary | ICD-10-CM | POA: Diagnosis not present

## 2016-09-17 DIAGNOSIS — Z79899 Other long term (current) drug therapy: Secondary | ICD-10-CM | POA: Diagnosis not present

## 2016-09-17 DIAGNOSIS — R0981 Nasal congestion: Secondary | ICD-10-CM | POA: Diagnosis not present

## 2016-09-17 DIAGNOSIS — H9202 Otalgia, left ear: Secondary | ICD-10-CM | POA: Diagnosis not present

## 2016-09-17 DIAGNOSIS — R05 Cough: Secondary | ICD-10-CM | POA: Diagnosis not present

## 2016-09-17 DIAGNOSIS — F17210 Nicotine dependence, cigarettes, uncomplicated: Secondary | ICD-10-CM | POA: Diagnosis not present

## 2016-09-17 DIAGNOSIS — R51 Headache: Secondary | ICD-10-CM | POA: Diagnosis not present

## 2016-09-17 DIAGNOSIS — H1132 Conjunctival hemorrhage, left eye: Secondary | ICD-10-CM | POA: Diagnosis not present

## 2016-11-06 DIAGNOSIS — N2581 Secondary hyperparathyroidism of renal origin: Secondary | ICD-10-CM | POA: Diagnosis not present

## 2016-11-06 DIAGNOSIS — N022 Recurrent and persistent hematuria with diffuse membranous glomerulonephritis: Secondary | ICD-10-CM | POA: Diagnosis not present

## 2016-11-06 DIAGNOSIS — I10 Essential (primary) hypertension: Secondary | ICD-10-CM | POA: Diagnosis not present

## 2016-11-06 DIAGNOSIS — N184 Chronic kidney disease, stage 4 (severe): Secondary | ICD-10-CM | POA: Diagnosis not present

## 2016-11-06 DIAGNOSIS — E872 Acidosis: Secondary | ICD-10-CM | POA: Diagnosis not present

## 2016-11-11 DIAGNOSIS — I10 Essential (primary) hypertension: Secondary | ICD-10-CM | POA: Diagnosis not present

## 2016-11-11 DIAGNOSIS — N022 Recurrent and persistent hematuria with diffuse membranous glomerulonephritis: Secondary | ICD-10-CM | POA: Diagnosis not present

## 2016-11-11 DIAGNOSIS — N185 Chronic kidney disease, stage 5: Secondary | ICD-10-CM | POA: Diagnosis not present

## 2016-11-11 DIAGNOSIS — R809 Proteinuria, unspecified: Secondary | ICD-10-CM | POA: Diagnosis not present

## 2016-12-31 ENCOUNTER — Other Ambulatory Visit: Payer: Self-pay | Admitting: Family Medicine

## 2016-12-31 ENCOUNTER — Other Ambulatory Visit (HOSPITAL_COMMUNITY): Payer: Self-pay | Admitting: Family Medicine

## 2016-12-31 DIAGNOSIS — N6320 Unspecified lump in the left breast, unspecified quadrant: Secondary | ICD-10-CM

## 2017-01-08 ENCOUNTER — Ambulatory Visit
Admission: RE | Admit: 2017-01-08 | Discharge: 2017-01-08 | Disposition: A | Discharge status: Home / Self Care | Payer: BLUE CROSS/BLUE SHIELD | Source: Ambulatory Visit | Attending: Family Medicine | Admitting: Family Medicine

## 2017-01-08 DIAGNOSIS — R928 Other abnormal and inconclusive findings on diagnostic imaging of breast: Secondary | ICD-10-CM | POA: Diagnosis not present

## 2017-01-08 DIAGNOSIS — N6320 Unspecified lump in the left breast, unspecified quadrant: Secondary | ICD-10-CM

## 2017-01-08 DIAGNOSIS — N6489 Other specified disorders of breast: Secondary | ICD-10-CM | POA: Diagnosis not present

## 2017-02-24 DIAGNOSIS — R809 Proteinuria, unspecified: Secondary | ICD-10-CM | POA: Diagnosis not present

## 2017-02-24 DIAGNOSIS — I10 Essential (primary) hypertension: Secondary | ICD-10-CM | POA: Diagnosis not present

## 2017-02-24 DIAGNOSIS — D631 Anemia in chronic kidney disease: Secondary | ICD-10-CM | POA: Diagnosis not present

## 2017-02-24 DIAGNOSIS — N184 Chronic kidney disease, stage 4 (severe): Secondary | ICD-10-CM | POA: Diagnosis not present

## 2017-02-24 DIAGNOSIS — N2581 Secondary hyperparathyroidism of renal origin: Secondary | ICD-10-CM | POA: Diagnosis not present

## 2017-03-09 ENCOUNTER — Emergency Department: Payer: BLUE CROSS/BLUE SHIELD

## 2017-03-09 ENCOUNTER — Emergency Department
Admission: EM | Admit: 2017-03-09 | Discharge: 2017-03-09 | Disposition: A | Discharge status: Home / Self Care | Payer: BLUE CROSS/BLUE SHIELD | Attending: Emergency Medicine | Admitting: Emergency Medicine

## 2017-03-09 DIAGNOSIS — R531 Weakness: Secondary | ICD-10-CM | POA: Diagnosis not present

## 2017-03-09 DIAGNOSIS — I129 Hypertensive chronic kidney disease with stage 1 through stage 4 chronic kidney disease, or unspecified chronic kidney disease: Secondary | ICD-10-CM | POA: Diagnosis not present

## 2017-03-09 DIAGNOSIS — R1031 Right lower quadrant pain: Secondary | ICD-10-CM | POA: Insufficient documentation

## 2017-03-09 DIAGNOSIS — R112 Nausea with vomiting, unspecified: Secondary | ICD-10-CM

## 2017-03-09 DIAGNOSIS — N189 Chronic kidney disease, unspecified: Secondary | ICD-10-CM | POA: Insufficient documentation

## 2017-03-09 DIAGNOSIS — R42 Dizziness and giddiness: Secondary | ICD-10-CM | POA: Diagnosis not present

## 2017-03-09 DIAGNOSIS — R1110 Vomiting, unspecified: Secondary | ICD-10-CM | POA: Diagnosis not present

## 2017-03-09 DIAGNOSIS — F17210 Nicotine dependence, cigarettes, uncomplicated: Secondary | ICD-10-CM | POA: Insufficient documentation

## 2017-03-09 LAB — CBC
HCT: 38.8 % — ABNORMAL LOW (ref 40.0–52.0)
HEMOGLOBIN: 13.6 g/dL (ref 13.0–18.0)
MCH: 32.8 pg (ref 26.0–34.0)
MCHC: 35 g/dL (ref 32.0–36.0)
MCV: 93.5 fL (ref 80.0–100.0)
PLATELETS: 260 10*3/uL (ref 150–440)
RBC: 4.15 MIL/uL — ABNORMAL LOW (ref 4.40–5.90)
RDW: 13.2 % (ref 11.5–14.5)
WBC: 8.2 10*3/uL (ref 3.8–10.6)

## 2017-03-09 LAB — HEPATIC FUNCTION PANEL
ALK PHOS: 85 U/L (ref 38–126)
ALT: 21 U/L (ref 17–63)
AST: 26 U/L (ref 15–41)
Albumin: 3.4 g/dL — ABNORMAL LOW (ref 3.5–5.0)
BILIRUBIN TOTAL: 0.4 mg/dL (ref 0.3–1.2)
TOTAL PROTEIN: 6.3 g/dL — AB (ref 6.5–8.1)

## 2017-03-09 LAB — URINALYSIS, COMPLETE (UACMP) WITH MICROSCOPIC
BILIRUBIN URINE: NEGATIVE
Bacteria, UA: NONE SEEN
GLUCOSE, UA: 50 mg/dL — AB
Ketones, ur: NEGATIVE mg/dL
Leukocytes, UA: NEGATIVE
NITRITE: NEGATIVE
PH: 5 (ref 5.0–8.0)
Protein, ur: 100 mg/dL — AB
Specific Gravity, Urine: 1.016 (ref 1.005–1.030)
Squamous Epithelial / LPF: NONE SEEN

## 2017-03-09 LAB — LIPASE, BLOOD: LIPASE: 62 U/L — AB (ref 11–51)

## 2017-03-09 LAB — BASIC METABOLIC PANEL
ANION GAP: 8 (ref 5–15)
BUN: 61 mg/dL — ABNORMAL HIGH (ref 6–20)
CALCIUM: 8.3 mg/dL — AB (ref 8.9–10.3)
CO2: 17 mmol/L — ABNORMAL LOW (ref 22–32)
Chloride: 112 mmol/L — ABNORMAL HIGH (ref 101–111)
Creatinine, Ser: 4.68 mg/dL — ABNORMAL HIGH (ref 0.61–1.24)
GFR, EST AFRICAN AMERICAN: 15 mL/min — AB (ref >60)
GFR, EST NON AFRICAN AMERICAN: 13 mL/min — AB (ref >60)
GLUCOSE: 88 mg/dL (ref 65–99)
Potassium: 4.4 mmol/L (ref 3.5–5.1)
Sodium: 137 mmol/L (ref 135–145)

## 2017-03-09 LAB — TROPONIN I: Troponin I: <0.03 ng/mL (ref <0.03)

## 2017-03-09 LAB — POCT RAPID STREP A: Streptococcus, Group A Screen (Direct): NEGATIVE

## 2017-03-09 MED ORDER — SODIUM CHLORIDE 0.9 % IV BOLUS (SEPSIS)
500.0000 mL | Freq: Once | INTRAVENOUS | Status: AC
  Administered 2017-03-09: 500 mL via INTRAVENOUS

## 2017-03-09 MED ORDER — ONDANSETRON HCL 4 MG/2ML IJ SOLN
4.0000 mg | Freq: Once | INTRAMUSCULAR | Status: AC
  Administered 2017-03-09: 4 mg via INTRAVENOUS
  Filled 2017-03-09: qty 2

## 2017-03-09 MED ORDER — SODIUM CHLORIDE 0.9 % IV BOLUS (SEPSIS)
1000.0000 mL | Freq: Once | INTRAVENOUS | Status: AC
  Administered 2017-03-09: 1000 mL via INTRAVENOUS

## 2017-03-09 MED ORDER — ONDANSETRON 4 MG PO TBDP: 4.0000 mg | ORAL_TABLET | Freq: Three times a day (TID) | ORAL | 0 refills | Status: DC | PRN

## 2017-03-09 NOTE — ED Notes (Signed)
Pt presents "not feeling good all week" (since Wednesday). Pt reports emesis x 6 in last 24 hours, as well as flushing, weakness, dizziness. Pt working with Dr. Holley Raring regarding peritoneal dialysis, but this has not yet begun. Pt also reports pain in right ear, 5/10. Pt alert & oriented. NAD noted.

## 2017-03-09 NOTE — ED Triage Notes (Signed)
Has been feeling poorly all week with nausea and vomiting. Found out at Dr. Elwyn Lade office that his kidney function was down to 15% and may be needing to start dialysis soon. Patient denies chest pain or SHOB.

## 2017-03-09 NOTE — Discharge Instructions (Signed)
You may take Zofran for nausea or vomiting. Please follow up bland diet. Please make an appointment with your nephrologist, your kidney doctor, in the next 2-3 days.  Drink plenty of fluid to stay well-hydrated and to prevent lightheadedness.  Return to the emergency department if you develop severe pain, lightheadedness or fainting, fever, or any other symptoms concerning to you.

## 2017-03-09 NOTE — ED Provider Notes (Signed)
[facility] Emergency Department Provider Note  ____________________________________________  Time seen: Approximately 7:38 AM  I have reviewed the triage vital signs and the nursing notes.   HISTORY  Chief Complaint Nausea and Emesis    HPI Larry [name] is a 51 y.o. male with a history of CKD, HTN, presenting withnausea and vomiting, lightheadedness. The patient reports that for the past 3 days, he has had morning vomiting, but has been able to tolerate food and drink throughout the rest the day. He has had general malaise and decreased appetite. He also reports lightheadedness and dizziness, at rest or with standing. He denies any chest pain, shortness of breath, lower extremity swelling, fever or chills although he has felt warm at times. Positive constipation although the patient did have a small bowel movement this morning; no diarrhea or associated abdominal pain. He recently has been under discussion about initiating dialysis with Dr. Eduard Clos, but has not started HD yet.   Past Medical History:  Diagnosis Date  . Chronic kidney disease   . Hypertension     Patient Active Problem List   Diagnosis Date Noted  . even 1 per today and did not I would not with an order 02/07/2015    Past Surgical History:  Procedure Laterality Date  . CHOLECYSTECTOMY    . RENAL BIOPSY      Current Outpatient Rx  . Order #: [id number] Class: Historical Med  . Order #: [id number] Class: Historical Med  . Order #: [id number] Class: Historical Med  . Order #: [id number] Class: Historical Med  . Order #: [id number] Class: Print    Allergies Amoxicillin  Family History  Problem Relation Age of Onset  . Diabetes Mother   . Kidney disease Mother        mother on dialysis  . CAD Mother   . Diabetes Sister   . CAD Sister     Social History Social History  Substance Use Topics  . Smoking status: Current Every Day Smoker    Packs/day: 1.00    Years: 20.00    Types:  Cigarettes  . Smokeless tobacco: Never Used  . Alcohol use Yes     Comment: occ. alcohol use    Review of Systems Constitutional: Felt warm, but no fevers or chills. Positive general malaise. Positive lightheadedness and dizziness. Eyes: No visual changes. ENT: No sore throat. No congestion or rhinorrhea. Cardiovascular: Denies chest pain. Denies palpitations. Respiratory: Denies shortness of breath.  No cough. Gastrointestinal: No abdominal pain.  Positive nausea, positive vomiting.  No diarrhea.  No constipation. Genitourinary: Negative for dysuria. And he needs to urinate. Musculoskeletal: Negative for back pain. Skin: Negative for rash. Neurological: Negative for headaches. No focal numbness, tingling or weakness.     ____________________________________________   PHYSICAL EXAM:  VITAL SIGNS: ED Triage Vitals  Enc Vitals Group     BP 03/09/17 0649 (!) 141/88     Pulse Rate 03/09/17 0649 71     Resp 03/09/17 0649 (!) 22     Temp 03/09/17 0649 97.8 F (36.6 C)     Temp Source 03/09/17 0649 Oral     SpO2 03/09/17 0649 98 %     Weight 03/09/17 0642 160 lb (72.6 kg)     Height 03/09/17 0642 5\' 6"  (1.676 m)     Head Circumference --      Peak Flow --      Pain Score 03/09/17 0642 5     Pain Loc --      Pain  Edu? --      Excl. in [place]? --     Constitutional: Alert and oriented. Chronically ill and mildly uncomfortable appearing and in no acute distress. Answers questions appropriately. Eyes: Conjunctivae are normal.  EOMI. No scleral icterus. Head: Atraumatic. Nose: No congestion/rhinnorhea. Mouth/Throat: Mucous membranes are dry.  Neck: No stridor.  Supple.   Cardiovascular: Normal rate, regular rhythm. No murmurs, rubs or gallops.  Respiratory: Normal respiratory effort.  No accessory muscle use or retractions. Lungs CTAB.  No wheezes, rales or ronchi. Gastrointestinal: Soft, and nondistended.  Tender to palpation in the right lower quadrant. No Murphy sign. No  guarding or rebound.  No peritoneal signs. Musculoskeletal: No LE edema.  Neurologic:  A&Ox3.  Speech is clear.  Face and smile are symmetric.  EOMI.  Moves all extremities well. Skin:  Skin is warm, dry and intact. No rash noted. Psychiatric: Mood and affect are normal. Speech and behavior are normal.  Normal judgement.  ____________________________________________   LABS (all labs ordered are listed, but only abnormal results are displayed)  Labs Reviewed  BASIC METABOLIC PANEL - Abnormal; Notable for the following:       Result Value   Chloride 112 (*)    CO2 17 (*)    BUN 61 (*)    Creatinine, Ser 4.68 (*)    Calcium 8.3 (*)    GFR calc non Af Amer 13 (*)    GFR calc Af Amer 15 (*)    All other components within normal limits  CBC - Abnormal; Notable for the following:    RBC 4.15 (*)    HCT 38.8 (*)    All other components within normal limits  URINALYSIS, COMPLETE (UACMP) WITH MICROSCOPIC - Abnormal; Notable for the following:    Color, Urine YELLOW (*)    APPearance CLEAR (*)    Glucose, UA 50 (*)    Hgb urine dipstick MODERATE (*)    Protein, ur 100 (*)    All other components within normal limits  HEPATIC FUNCTION PANEL - Abnormal; Notable for the following:    Total Protein 6.3 (*)    Albumin 3.4 (*)    Bilirubin, Direct <0.1 (*)    All other components within normal limits  LIPASE, BLOOD - Abnormal; Notable for the following:    Lipase 62 (*)    All other components within normal limits  TROPONIN I  CBG MONITORING, ED  POCT RAPID STREP A   ____________________________________________  EKG  ED ECG REPORT I, Eula Listen, the attending physician, personally viewed and interpreted this ECG.   Date: 03/09/2017  EKG Time: 732  Rate: 66  Rhythm: normal sinus rhythm  Axis: normal  Intervals:none  ST&T Change: No STEMI  ____________________________________________  RADIOLOGY  Dg Chest 2 View  Result Date: 03/09/2017 CLINICAL DATA:   Vomiting. EXAM: CHEST  2 VIEW COMPARISON:  12/24/2015 FINDINGS: The cardiomediastinal silhouette is within normal limits. The lungs are well inflated. Small focal lingular opacity on the prior study is less conspicuous on the current examination, with slight residual density in this area possibly reflecting a summation of shadows. No consolidation or nodule was present in the lung bases on today's earlier abdominal CT. Elsewhere, no airspace consolidation, edema, pleural effusion, or pneumothorax is identified. Right upper quadrant abdominal surgical clips are noted. No acute osseous abnormality is seen. IMPRESSION: No active cardiopulmonary disease. Electronically Signed   By: Logan Bores M.D.   On: 03/09/2017 08:11   Ct Renal Stone Study  Result Date: 03/09/2017 CLINICAL DATA:  Weakness and right lower quadrant pain EXAM: CT ABDOMEN AND PELVIS WITHOUT CONTRAST TECHNIQUE: Multidetector CT imaging of the abdomen and pelvis was performed following the standard protocol without IV contrast. COMPARISON:  12/25/2015 FINDINGS: Lower chest: No acute abnormality. Hepatobiliary: No focal liver abnormality is seen. Status post cholecystectomy. No biliary dilatation. Pancreas: Unremarkable. No pancreatic ductal dilatation or surrounding inflammatory changes. Spleen: Normal in size without focal abnormality. Adrenals/Urinary Tract: Adrenal glands are unremarkable. Kidneys are normal, without renal calculi, focal lesion, or hydronephrosis. Bladder is unremarkable. Stomach/Bowel: Stomach is within normal limits. Appendix appears normal. No evidence of bowel wall thickening, distention, or inflammatory changes. Vascular/Lymphatic: Aortic atherosclerosis. No enlarged abdominal or pelvic lymph nodes. Reproductive: Prostate is unremarkable. Other: No abdominal wall hernia or abnormality. No abdominopelvic ascites. Musculoskeletal: No acute or significant osseous findings. IMPRESSION: No acute abnormality noted. No significant  interval difference from the prior exam is noted. Electronically Signed   By: Inez Catalina M.D.   On: 03/09/2017 07:59    ____________________________________________   PROCEDURES  Procedure(s) performed: None  Procedures  Critical Care performed: No ____________________________________________   INITIAL IMPRESSION / ASSESSMENT AND PLAN / ED COURSE  Pertinent labs & imaging results that were available during my care of the patient were reviewed by me and considered in my medical decision making (see chart for details).  51 y.o. male with a history of progressively worsening CKD presenting for morning nausea and vomiting, now associated with lightheadedness and dizziness. Overall, the patient has stable vital signs, but he does appear clinically dehydrated. I wonder if he is uremic today. We will check his creatinine, BUN, and other basic laboratory studies. Will rule out UTI. The patient does have some tenderness on my examination in the right lower quadrant, also get a CT scan without IV contrast for evaluation of the appendix. Plan reevaluation for final disposition.  ----------------------------------------- 8:29 AM on 03/09/2017 -----------------------------------------  The patient told the nurse that he was having some difficulty swallowing. When I asked him about this, he actually does not have difficulty swallowing, and was able to swallow water without any difficulty. He has some mild pain with swallowing. He has had some mild cough and cold symptoms. We'll get a strep test, but this may be due to a viral syndrome. On examination, the patient does not have any posterior pharyngeal erythema, tonsillar swelling or exudate, and his posterior palate is symmetric with a uvula that is midline.  Here, the patient is hemodynamically stable. His creatinine is 4.68, which is within his baseline. He does have an elevated BUN, at 61, but has had this for over a year. This is not a new  finding. His chest x-ray does not show any pulmonary edema or new acute cardiopulmonary process. His troponin is negative and his EKG does not show ischemic changes. His CT scan does not show any acute intra-abdominal process. He does have an elevated lipase, but again, this is chronic for him for over a year.  I suspect that his uremia may be driving his morning nausea and vomiting, and at this time, we'll focus on symptom control and rehydration. He will receive another liter of intravenous fluid, an antiemetic, and undergo by mouth challenge. If he is able to keep down fluids, I will plan to discharge him home with close follow-up.  ----------------------------------------- 10:40 AM on 03/09/2017 -----------------------------------------  The patient's workup in the emergency department is reassuring, with mostly chronic unchanged abnormalities. The patient has been  tolerating liquid without any difficulty. His strep test is negative. He does not have a UTI. He is feeling better after intravenous fluids and is not orthostatic with his vital signs. At this time, the patient is safe for discharge home. Return precautions and follow-up instructions were discussed.  ____________________________________________  FINAL CLINICAL IMPRESSION(S) / ED DIAGNOSES  Final diagnoses:  Right lower quadrant pain  Lightheadedness  Nausea and vomiting, intractability of vomiting not specified, unspecified vomiting type         NEW MEDICATIONS STARTED DURING THIS VISIT:  New Prescriptions   ONDANSETRON (ZOFRAN ODT) 4 MG DISINTEGRATING TABLET    Take 1 tablet (4 mg total) by mouth every 8 (eight) hours as needed for nausea or vomiting.      Eula Listen, MD 03/09/17 1041

## 2017-03-09 NOTE — ED Notes (Signed)
Pt reports that he feels like it is hard to swallow. Denies pain, "just hard to swallow."

## 2017-03-24 DIAGNOSIS — R809 Proteinuria, unspecified: Secondary | ICD-10-CM | POA: Diagnosis not present

## 2017-03-24 DIAGNOSIS — N022 Recurrent and persistent hematuria with diffuse membranous glomerulonephritis: Secondary | ICD-10-CM | POA: Diagnosis not present

## 2017-03-24 DIAGNOSIS — N185 Chronic kidney disease, stage 5: Secondary | ICD-10-CM | POA: Diagnosis not present

## 2017-03-24 DIAGNOSIS — I10 Essential (primary) hypertension: Secondary | ICD-10-CM | POA: Diagnosis not present

## 2017-03-27 DIAGNOSIS — N022 Recurrent and persistent hematuria with diffuse membranous glomerulonephritis: Secondary | ICD-10-CM | POA: Diagnosis not present

## 2017-03-27 DIAGNOSIS — I10 Essential (primary) hypertension: Secondary | ICD-10-CM | POA: Diagnosis not present

## 2017-03-27 DIAGNOSIS — N184 Chronic kidney disease, stage 4 (severe): Secondary | ICD-10-CM | POA: Diagnosis not present

## 2017-03-27 DIAGNOSIS — N2581 Secondary hyperparathyroidism of renal origin: Secondary | ICD-10-CM | POA: Diagnosis not present

## 2017-05-12 DIAGNOSIS — N2581 Secondary hyperparathyroidism of renal origin: Secondary | ICD-10-CM | POA: Diagnosis not present

## 2017-05-12 DIAGNOSIS — N184 Chronic kidney disease, stage 4 (severe): Secondary | ICD-10-CM | POA: Diagnosis not present

## 2017-05-12 DIAGNOSIS — N022 Recurrent and persistent hematuria with diffuse membranous glomerulonephritis: Secondary | ICD-10-CM | POA: Diagnosis not present

## 2017-05-12 DIAGNOSIS — I10 Essential (primary) hypertension: Secondary | ICD-10-CM | POA: Diagnosis not present

## 2017-06-02 DIAGNOSIS — Z01818 Encounter for other preprocedural examination: Secondary | ICD-10-CM | POA: Diagnosis not present

## 2017-06-02 DIAGNOSIS — Z79899 Other long term (current) drug therapy: Secondary | ICD-10-CM | POA: Diagnosis not present

## 2017-06-02 DIAGNOSIS — J069 Acute upper respiratory infection, unspecified: Secondary | ICD-10-CM | POA: Diagnosis not present

## 2017-06-02 DIAGNOSIS — N186 End stage renal disease: Secondary | ICD-10-CM | POA: Diagnosis not present

## 2017-06-02 DIAGNOSIS — F17210 Nicotine dependence, cigarettes, uncomplicated: Secondary | ICD-10-CM | POA: Diagnosis not present

## 2017-06-02 DIAGNOSIS — Z841 Family history of disorders of kidney and ureter: Secondary | ICD-10-CM | POA: Diagnosis not present

## 2017-06-02 DIAGNOSIS — Z881 Allergy status to other antibiotic agents status: Secondary | ICD-10-CM | POA: Diagnosis not present

## 2017-06-02 DIAGNOSIS — Z91018 Allergy to other foods: Secondary | ICD-10-CM | POA: Diagnosis not present

## 2017-06-02 DIAGNOSIS — Z8249 Family history of ischemic heart disease and other diseases of the circulatory system: Secondary | ICD-10-CM | POA: Diagnosis not present

## 2017-06-25 DIAGNOSIS — N2581 Secondary hyperparathyroidism of renal origin: Secondary | ICD-10-CM | POA: Diagnosis not present

## 2017-06-25 DIAGNOSIS — N022 Recurrent and persistent hematuria with diffuse membranous glomerulonephritis: Secondary | ICD-10-CM | POA: Diagnosis not present

## 2017-06-25 DIAGNOSIS — I10 Essential (primary) hypertension: Secondary | ICD-10-CM | POA: Diagnosis not present

## 2017-06-25 DIAGNOSIS — N184 Chronic kidney disease, stage 4 (severe): Secondary | ICD-10-CM | POA: Diagnosis not present

## 2017-08-06 DIAGNOSIS — I10 Essential (primary) hypertension: Secondary | ICD-10-CM | POA: Diagnosis not present

## 2017-08-06 DIAGNOSIS — N022 Recurrent and persistent hematuria with diffuse membranous glomerulonephritis: Secondary | ICD-10-CM | POA: Diagnosis not present

## 2017-08-06 DIAGNOSIS — N184 Chronic kidney disease, stage 4 (severe): Secondary | ICD-10-CM | POA: Diagnosis not present

## 2017-08-06 DIAGNOSIS — N2581 Secondary hyperparathyroidism of renal origin: Secondary | ICD-10-CM | POA: Diagnosis not present

## 2017-09-17 DIAGNOSIS — N2581 Secondary hyperparathyroidism of renal origin: Secondary | ICD-10-CM | POA: Diagnosis not present

## 2017-09-17 DIAGNOSIS — I10 Essential (primary) hypertension: Secondary | ICD-10-CM | POA: Diagnosis not present

## 2017-09-17 DIAGNOSIS — N184 Chronic kidney disease, stage 4 (severe): Secondary | ICD-10-CM | POA: Diagnosis not present

## 2017-09-17 DIAGNOSIS — N022 Recurrent and persistent hematuria with diffuse membranous glomerulonephritis: Secondary | ICD-10-CM | POA: Diagnosis not present

## 2017-09-26 DIAGNOSIS — Z720 Tobacco use: Secondary | ICD-10-CM | POA: Diagnosis not present

## 2017-09-26 DIAGNOSIS — I129 Hypertensive chronic kidney disease with stage 1 through stage 4 chronic kidney disease, or unspecified chronic kidney disease: Secondary | ICD-10-CM | POA: Insufficient documentation

## 2017-09-26 DIAGNOSIS — N400 Enlarged prostate without lower urinary tract symptoms: Secondary | ICD-10-CM | POA: Diagnosis not present

## 2017-09-26 DIAGNOSIS — R110 Nausea: Secondary | ICD-10-CM | POA: Diagnosis not present

## 2017-09-26 DIAGNOSIS — Z9049 Acquired absence of other specified parts of digestive tract: Secondary | ICD-10-CM | POA: Diagnosis not present

## 2017-09-26 DIAGNOSIS — N179 Acute kidney failure, unspecified: Secondary | ICD-10-CM | POA: Diagnosis not present

## 2017-09-26 DIAGNOSIS — F17210 Nicotine dependence, cigarettes, uncomplicated: Secondary | ICD-10-CM | POA: Insufficient documentation

## 2017-09-26 DIAGNOSIS — R1031 Right lower quadrant pain: Secondary | ICD-10-CM | POA: Diagnosis not present

## 2017-09-26 DIAGNOSIS — N185 Chronic kidney disease, stage 5: Secondary | ICD-10-CM | POA: Diagnosis not present

## 2017-09-26 DIAGNOSIS — N200 Calculus of kidney: Secondary | ICD-10-CM | POA: Diagnosis not present

## 2017-09-26 DIAGNOSIS — Z79899 Other long term (current) drug therapy: Secondary | ICD-10-CM | POA: Insufficient documentation

## 2017-09-26 DIAGNOSIS — Z716 Tobacco abuse counseling: Secondary | ICD-10-CM | POA: Diagnosis not present

## 2017-09-26 DIAGNOSIS — I120 Hypertensive chronic kidney disease with stage 5 chronic kidney disease or end stage renal disease: Secondary | ICD-10-CM | POA: Diagnosis not present

## 2017-09-26 DIAGNOSIS — R109 Unspecified abdominal pain: Secondary | ICD-10-CM | POA: Diagnosis not present

## 2017-09-26 DIAGNOSIS — N189 Chronic kidney disease, unspecified: Secondary | ICD-10-CM | POA: Diagnosis not present

## 2017-09-26 DIAGNOSIS — N1330 Unspecified hydronephrosis: Secondary | ICD-10-CM | POA: Diagnosis not present

## 2017-09-26 DIAGNOSIS — Z880 Allergy status to penicillin: Secondary | ICD-10-CM | POA: Diagnosis not present

## 2017-09-26 DIAGNOSIS — R197 Diarrhea, unspecified: Secondary | ICD-10-CM | POA: Diagnosis not present

## 2017-09-26 DIAGNOSIS — N132 Hydronephrosis with renal and ureteral calculous obstruction: Secondary | ICD-10-CM | POA: Diagnosis not present

## 2017-09-26 DIAGNOSIS — I10 Essential (primary) hypertension: Secondary | ICD-10-CM | POA: Diagnosis not present

## 2017-09-26 LAB — CBC
HEMATOCRIT: 42.9 % (ref 40.0–52.0)
Hemoglobin: 14.6 g/dL (ref 13.0–18.0)
MCH: 31.2 pg (ref 26.0–34.0)
MCHC: 34 g/dL (ref 32.0–36.0)
MCV: 91.7 fL (ref 80.0–100.0)
PLATELETS: 293 10*3/uL (ref 150–440)
RBC: 4.68 MIL/uL (ref 4.40–5.90)
RDW: 13.3 % (ref 11.5–14.5)
WBC: 11 10*3/uL — ABNORMAL HIGH (ref 3.8–10.6)

## 2017-09-26 LAB — URINALYSIS, COMPLETE (UACMP) WITH MICROSCOPIC
BACTERIA UA: NONE SEEN
Bilirubin Urine: NEGATIVE
Glucose, UA: 50 mg/dL — AB
HGB URINE DIPSTICK: NEGATIVE
KETONES UR: NEGATIVE mg/dL
Leukocytes, UA: NEGATIVE
NITRITE: NEGATIVE
PROTEIN: 100 mg/dL — AB
Specific Gravity, Urine: 1.018 (ref 1.005–1.030)
pH: 5 (ref 5.0–8.0)

## 2017-09-26 LAB — BASIC METABOLIC PANEL
Anion gap: 11 (ref 5–15)
BUN: 52 mg/dL — AB (ref 6–20)
CALCIUM: 9.9 mg/dL (ref 8.9–10.3)
CO2: 27 mmol/L (ref 22–32)
CREATININE: 5.53 mg/dL — AB (ref 0.61–1.24)
Chloride: 101 mmol/L (ref 101–111)
GFR calc Af Amer: 13 mL/min — ABNORMAL LOW (ref >60)
GFR, EST NON AFRICAN AMERICAN: 11 mL/min — AB (ref >60)
Glucose, Bld: 101 mg/dL — ABNORMAL HIGH (ref 65–99)
POTASSIUM: 4.2 mmol/L (ref 3.5–5.1)
SODIUM: 139 mmol/L (ref 135–145)

## 2017-09-26 MED ORDER — FENTANYL CITRATE (PF) 100 MCG/2ML IJ SOLN
50.0000 ug | INTRAMUSCULAR | Status: DC | PRN
  Administered 2017-09-26: 50 ug via NASAL
  Filled 2017-09-26: qty 2

## 2017-09-26 MED ORDER — ONDANSETRON 4 MG PO TBDP
4.0000 mg | ORAL_TABLET | Freq: Once | ORAL | Status: AC
  Administered 2017-09-26: 4 mg via ORAL
  Filled 2017-09-26: qty 1

## 2017-09-26 MED ORDER — OXYCODONE-ACETAMINOPHEN 5-325 MG PO TABS
1.0000 | ORAL_TABLET | Freq: Once | ORAL | Status: AC
  Administered 2017-09-26: 1 via ORAL
  Filled 2017-09-26: qty 1

## 2017-09-26 NOTE — ED Triage Notes (Addendum)
Patient c/o right flank pain, decreased urinary stream and oliguria. Patient reports hx of nephrotic syndrome - kidney function at 15%.

## 2017-09-27 ENCOUNTER — Emergency Department: Payer: BLUE CROSS/BLUE SHIELD

## 2017-09-27 ENCOUNTER — Emergency Department
Admission: EM | Admit: 2017-09-27 | Discharge: 2017-09-27 | Disposition: A | Discharge status: Home / Self Care | Payer: BLUE CROSS/BLUE SHIELD | Attending: Emergency Medicine | Admitting: Emergency Medicine

## 2017-09-27 DIAGNOSIS — N200 Calculus of kidney: Secondary | ICD-10-CM

## 2017-09-27 DIAGNOSIS — R109 Unspecified abdominal pain: Secondary | ICD-10-CM | POA: Diagnosis not present

## 2017-09-27 MED ORDER — ONDANSETRON 4 MG PO TBDP: 4.0000 mg | ORAL_TABLET | Freq: Three times a day (TID) | ORAL | 0 refills | Status: DC | PRN

## 2017-09-27 MED ORDER — OXYCODONE-ACETAMINOPHEN 5-325 MG PO TABS
1.0000 | ORAL_TABLET | Freq: Once | ORAL | Status: AC
  Administered 2017-09-27: 1 via ORAL
  Filled 2017-09-27: qty 1

## 2017-09-27 MED ORDER — FAMOTIDINE 20 MG PO TABS: 20.0000 mg | ORAL_TABLET | Freq: Two times a day (BID) | ORAL | 0 refills | Status: DC

## 2017-09-27 MED ORDER — ONDANSETRON 4 MG PO TBDP
4.0000 mg | ORAL_TABLET | Freq: Once | ORAL | Status: AC
  Administered 2017-09-27: 4 mg via ORAL
  Filled 2017-09-27: qty 1

## 2017-09-27 MED ORDER — OXYCODONE-ACETAMINOPHEN 5-325 MG PO TABS: 1.0000 | ORAL_TABLET | Freq: Four times a day (QID) | ORAL | 0 refills | Status: DC | PRN

## 2017-09-27 NOTE — ED Provider Notes (Signed)
[facility] Emergency Department Provider Note  ____________________________________________   First MD Initiated Contact with Patient 09/27/17 0128     (approximate)  I have reviewed the triage vital signs and the nursing notes.   HISTORY  Chief Complaint Flank Pain    HPI Larry [name] is a 52 y.o. male who comes to the emergency department with moderate severity right-sided abdominal pain.  His symptoms all began 3 days ago with multiple episodes of loose watery stools a day associated with nausea.  He denies fevers or chills.  His symptoms of diarrhea have improved but now he is developed right flank pain radiating towards his right groin.  Nothing seems to make his symptoms better or worse.  He has no history of nephrolithiasis.  He does have a remote history of cholecystectomy.  Normal bowel movements currently.  Past Medical History:  Diagnosis Date  . Chronic kidney disease   . Hypertension     Patient Active Problem List   Diagnosis Date Noted  . even 1 per today and did not I would not with an order 02/07/2015    Past Surgical History:  Procedure Laterality Date  . CHOLECYSTECTOMY    . RENAL BIOPSY      Prior to Admission medications   Medication Sig Start Date End Date Taking? Authorizing Provider  famotidine (PEPCID) 20 MG tablet Take 1 tablet (20 mg total) by mouth 2 (two) times daily. 09/27/17 09/27/18  Darel Hong, MD  hydrALAZINE (APRESOLINE) 25 MG tablet Take 25 mg by mouth daily.     [provider]  losartan (COZAAR) 25 MG tablet Take 25 mg by mouth daily.    [provider]  metoprolol succinate (TOPROL-XL) 100 MG 24 hr tablet Take 100 mg by mouth daily.    [provider]  ondansetron (ZOFRAN ODT) 4 MG disintegrating tablet Take 1 tablet (4 mg total) by mouth every 8 (eight) hours as needed for nausea or vomiting. 03/09/17   Eula Listen, MD  ondansetron (ZOFRAN ODT) 4 MG disintegrating  tablet Take 1 tablet (4 mg total) by mouth every 8 (eight) hours as needed for nausea or vomiting. 09/27/17   Darel Hong, MD  oxyCODONE-acetaminophen (ROXICET) 5-325 MG tablet Take 1 tablet by mouth every 6 (six) hours as needed for severe pain. 09/27/17   Darel Hong, MD  sodium bicarbonate 650 MG tablet Take 1,300 mg by mouth 2 (two) times daily.    [provider]    Allergies Amoxicillin  Family History  Problem Relation Age of Onset  . Diabetes Mother   . Kidney disease Mother        mother on dialysis  . CAD Mother   . Diabetes Sister   . CAD Sister     Social History Social History   Tobacco Use  . Smoking status: Current Every Day Smoker    Packs/day: 1.00    Years: 20.00    Pack years: 20.00    Types: Cigarettes  . Smokeless tobacco: Never Used  Substance Use Topics  . Alcohol use: Yes    Comment: occ. alcohol use  . Drug use: No    Review of Systems Constitutional: No fever/chills Eyes: No visual changes. ENT: No sore throat. Cardiovascular: Denies chest pain. Respiratory: Denies shortness of breath. Gastrointestinal: Positive for abdominal pain.  Positive for nausea, no vomiting.  Positive for diarrhea.  No constipation. Genitourinary: Negative for dysuria. Musculoskeletal: Negative for back pain. Skin: Negative for rash. Neurological: Negative  for headaches, focal weakness or numbness.   ____________________________________________   PHYSICAL EXAM:  VITAL SIGNS: ED Triage Vitals  Enc Vitals Group     BP 09/26/17 2135 (!) 200/123     Pulse Rate 09/26/17 2135 74     Resp 09/26/17 2135 18     Temp 09/26/17 2135 97.9 F (36.6 C)     Temp Source 09/26/17 2135 Oral     SpO2 09/26/17 2135 97 %     Weight 09/26/17 2136 160 lb (72.6 kg)     Height --      Head Circumference --      Peak Flow --      Pain Score 09/26/17 2135 7     Pain Loc --      Pain Edu? --      Excl. in [place]? --     Constitutional: Alert and oriented x4 appears  somewhat uncomfortable nontoxic Eyes: PERRL EOMI. Head: Atraumatic. Nose: No congestion/rhinnorhea. Mouth/Throat: No trismus Neck: No stridor.   Cardiovascular: Normal rate, regular rhythm. Grossly normal heart sounds.  Good peripheral circulation. Respiratory: Normal respiratory effort.  No retractions. Lungs CTAB and moving good air Gastrointestinal: Soft nondistended mild right sided abdominal tenderness with no rebound no guarding no peritonitis no McBurney's tenderness negative Rovsing's Musculoskeletal: No lower extremity edema   Neurologic:  Normal speech and language. No gross focal neurologic deficits are appreciated. Skin:  Skin is warm, dry and intact. No rash noted. Psychiatric: Mood and affect are normal. Speech and behavior are normal.    ____________________________________________   DIFFERENTIAL includes but not limited to  Nephrolithiasis, urinary tract infection, pyelonephritis, appendicitis ____________________________________________   LABS (all labs ordered are listed, but only abnormal results are displayed)  Labs Reviewed  URINALYSIS, COMPLETE (UACMP) WITH MICROSCOPIC - Abnormal; Notable for the following components:      Result Value   Color, Urine YELLOW (*)    APPearance CLEAR (*)    Glucose, UA 50 (*)    Protein, ur 100 (*)    Squamous Epithelial / LPF 0-5 (*)    All other components within normal limits  BASIC METABOLIC PANEL - Abnormal; Notable for the following components:   Glucose, Bld 101 (*)    BUN 52 (*)    Creatinine, Ser 5.53 (*)    GFR calc non Af Amer 11 (*)    GFR calc Af Amer 13 (*)    All other components within normal limits  CBC - Abnormal; Notable for the following components:   WBC 11.0 (*)    All other components within normal limits    Lab work reviewed by me with slightly elevated white count which is nonspecific.  Creatinine is at his  baseline __________________________________________  EKG   ____________________________________________  RADIOLOGY  CT abdomen pelvis reviewed by me with right-sided hydroureter but no definite stone identified ____________________________________________   PROCEDURES  Procedure(s) performed: no  Procedures  Critical Care performed: no  Observation: no ____________________________________________   INITIAL IMPRESSION / ASSESSMENT AND PLAN / ED COURSE  Pertinent labs & imaging results that were available during my care of the patient were reviewed by me and considered in my medical decision making (see chart for details).  Patient's pain is improved after opioids.  His CT scan shows right-sided hydroureter however no stone.  This could either represent a recently passed stone or stone that is less than 3 mm and was lost in between cuts of the CT scan.  Had a lengthy discussion  with the patient and he will be given symptomatic treatment and 3-day follow-up.  Strict return precautions have been given to the patient verbalized understanding and agreement the plan.      ____________________________________________   FINAL CLINICAL IMPRESSION(S) / ED DIAGNOSES  Final diagnoses:  Kidney stone      NEW MEDICATIONS STARTED DURING THIS VISIT:  This SmartLink is deprecated. Use AVSMEDLIST instead to display the medication list for a patient.   Note:  This document was prepared using Dragon voice recognition software and may include unintentional dictation errors.     Darel Hong, MD 09/27/17 [phone]

## 2017-09-27 NOTE — ED Notes (Signed)
Patient reports Thursday had multiple episodes of loose stools. Patient saw RN and was prescribed Zofran and imodium. Patient denies loose stools since beginning imodium, however reports continued nausea and complains of indigestion after eating.   Patient c/o right flank pain also beginning Wednesday shooting around to abdomen close to testicle.

## 2017-09-27 NOTE — Discharge Instructions (Signed)
Please take your pain medication as needed for severe symptoms and return to the emergency department in 4 days if your symptoms are not dramatically improved.  Return sooner for any new or worsening symptoms such as fevers, chills, if you cannot eat or drink, or for any other issues whatsoever.  It was a pleasure to take care of you today, and thank you for coming to our emergency department.  If you have any questions or concerns before leaving please ask the nurse to grab me and I'm more than happy to go through your aftercare instructions again.  If you were prescribed any opioid pain medication today such as Norco, Vicodin, Percocet, morphine, hydrocodone, or oxycodone please make sure you do not drive when you are taking this medication as it can alter your ability to drive safely.  If you have any concerns once you are home that you are not improving or are in fact getting worse before you can make it to your follow-up appointment, please do not hesitate to call 911 and come back for further evaluation.  Darel Hong, MD  Results for orders placed or performed during the hospital encounter of 09/27/17  Urinalysis, Complete w Microscopic  Result Value Ref Range   Color, Urine YELLOW (A) YELLOW   APPearance CLEAR (A) CLEAR   Specific Gravity, Urine 1.018 1.005 - 1.030   pH 5.0 5.0 - 8.0   Glucose, UA 50 (A) NEGATIVE mg/dL   Hgb urine dipstick NEGATIVE NEGATIVE   Bilirubin Urine NEGATIVE NEGATIVE   Ketones, ur NEGATIVE NEGATIVE mg/dL   Protein, ur 100 (A) NEGATIVE mg/dL   Nitrite NEGATIVE NEGATIVE   Leukocytes, UA NEGATIVE NEGATIVE   RBC / HPF 0-5 0 - 5 RBC/hpf   WBC, UA 0-5 0 - 5 WBC/hpf   Bacteria, UA NONE SEEN NONE SEEN   Squamous Epithelial / LPF 0-5 (A) NONE SEEN   Mucus PRESENT   Basic metabolic panel  Result Value Ref Range   Sodium 139 135 - 145 mmol/L   Potassium 4.2 3.5 - 5.1 mmol/L   Chloride 101 101 - 111 mmol/L   CO2 27 22 - 32 mmol/L   Glucose, Bld 101 (H) 65 -  99 mg/dL   BUN 52 (H) 6 - 20 mg/dL   Creatinine, Ser 5.53 (H) 0.61 - 1.24 mg/dL   Calcium 9.9 8.9 - 10.3 mg/dL   GFR calc non Af Amer 11 (L) >60 mL/min   GFR calc Af Amer 13 (L) >60 mL/min   Anion gap 11 5 - 15  CBC  Result Value Ref Range   WBC 11.0 (H) 3.8 - 10.6 K/uL   RBC 4.68 4.40 - 5.90 MIL/uL   Hemoglobin 14.6 13.0 - 18.0 g/dL   HCT 42.9 40.0 - 52.0 %   MCV 91.7 80.0 - 100.0 fL   MCH 31.2 26.0 - 34.0 pg   MCHC 34.0 32.0 - 36.0 g/dL   RDW 13.3 11.5 - 14.5 %   Platelets 293 150 - 440 K/uL   Ct Renal Stone Study  Result Date: 09/27/2017 CLINICAL DATA:  Right flank pain. EXAM: CT ABDOMEN AND PELVIS WITHOUT CONTRAST TECHNIQUE: Multidetector CT imaging of the abdomen and pelvis was performed following the standard protocol without IV contrast. COMPARISON:  CT 03/09/2017 FINDINGS: Lower chest: The lung bases are clear. Hepatobiliary: No focal hepatic lesion allowing for lack contrast. Clips in the gallbladder fossa postcholecystectomy. No biliary dilatation. Pancreas: No ductal dilatation or inflammation. Spleen: Normal in size without focal  abnormality. Small splenule inferiorly. Adrenals/Urinary Tract: No adrenal nodule. Moderate right hydroureteronephrosis with perinephric and periureteric edema. No stone or cause of obstruction. Mild left renal parenchymal thinning without hydronephrosis or stone. The left ureter is decompressed. Urinary bladder is partially distended without stone or wall thickening. No urethral stone visualized. Stomach/Bowel: Small hiatal hernia and distal esophageal wall thickening. Stomach physiologically distended. No small bowel inflammation, wall thickening or obstruction. Normal appendix. Small to moderate colonic stool burden without colonic wall thickening or inflammation. Vascular/Lymphatic: Aortic atherosclerosis without aneurysm. No enlarged abdominal or pelvic lymph nodes. Reproductive: Prostate is unremarkable. Other: Fat within both inguinal canals. No free  air or ascites. No intra-abdominal abscess. Musculoskeletal: There are no acute or suspicious osseous abnormalities. 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 IMPRESSION: 1. Right hydroureteronephrosis and perinephric/periureteric stranding without stone or cause of obstruction. Findings may be due to recently passed stone or urinary tract infection. Cystoscopy could be considered based on clinical concern. 2. Mild distal esophageal wall thickening and tiny hiatal hernia, can be seen with reflux. 3.  Aortic Atherosclerosis (ICD10-I70.0). Electronically Signed   By: Jeb Levering M.D.   On: 09/27/2017 02:02

## 2017-09-27 NOTE — ED Notes (Signed)
Reviewed discharge instructions, follow-up care, and prescriptions with patient. Patient verbalized understanding of all information reviewed. Patient stable, with no distress noted at this time.    Patient asked if he could drive himself home with the medications he had been given in the ED. Patient was informed by this RN that he could not legally drive. Patient verbalized understanding and reported that he would call his wife to get him and drive him home. Patient out to lobby to wait for ride.

## 2017-09-30 ENCOUNTER — Inpatient Hospital Stay: Payer: BLUE CROSS/BLUE SHIELD | Admitting: Anesthesiology

## 2017-09-30 ENCOUNTER — Encounter
Admission: EM | Disposition: A | Discharge status: Home / Self Care | Payer: Self-pay | Source: Home / Self Care | Attending: Emergency Medicine

## 2017-09-30 ENCOUNTER — Other Ambulatory Visit: Payer: Self-pay

## 2017-09-30 ENCOUNTER — Emergency Department: Payer: BLUE CROSS/BLUE SHIELD

## 2017-09-30 ENCOUNTER — Ambulatory Visit: Payer: Self-pay | Admitting: Urology

## 2017-09-30 ENCOUNTER — Encounter: Payer: Self-pay | Admitting: Emergency Medicine

## 2017-09-30 ENCOUNTER — Observation Stay
Admission: EM | Admit: 2017-09-30 | Discharge: 2017-10-01 | DRG: 660 | Disposition: A | Discharge status: Home / Self Care | Payer: BLUE CROSS/BLUE SHIELD | Attending: Internal Medicine | Admitting: Internal Medicine

## 2017-09-30 DIAGNOSIS — I120 Hypertensive chronic kidney disease with stage 5 chronic kidney disease or end stage renal disease: Secondary | ICD-10-CM | POA: Insufficient documentation

## 2017-09-30 DIAGNOSIS — Z880 Allergy status to penicillin: Secondary | ICD-10-CM | POA: Diagnosis not present

## 2017-09-30 DIAGNOSIS — Z79899 Other long term (current) drug therapy: Secondary | ICD-10-CM | POA: Diagnosis not present

## 2017-09-30 DIAGNOSIS — R1031 Right lower quadrant pain: Secondary | ICD-10-CM

## 2017-09-30 DIAGNOSIS — F17210 Nicotine dependence, cigarettes, uncomplicated: Secondary | ICD-10-CM | POA: Diagnosis not present

## 2017-09-30 DIAGNOSIS — N179 Acute kidney failure, unspecified: Secondary | ICD-10-CM | POA: Insufficient documentation

## 2017-09-30 DIAGNOSIS — N1330 Unspecified hydronephrosis: Principal | ICD-10-CM | POA: Insufficient documentation

## 2017-09-30 DIAGNOSIS — N132 Hydronephrosis with renal and ureteral calculous obstruction: Secondary | ICD-10-CM | POA: Diagnosis not present

## 2017-09-30 DIAGNOSIS — N400 Enlarged prostate without lower urinary tract symptoms: Secondary | ICD-10-CM | POA: Diagnosis not present

## 2017-09-30 DIAGNOSIS — R112 Nausea with vomiting, unspecified: Secondary | ICD-10-CM

## 2017-09-30 DIAGNOSIS — R109 Unspecified abdominal pain: Secondary | ICD-10-CM | POA: Diagnosis present

## 2017-09-30 DIAGNOSIS — N189 Chronic kidney disease, unspecified: Secondary | ICD-10-CM

## 2017-09-30 DIAGNOSIS — I10 Essential (primary) hypertension: Secondary | ICD-10-CM | POA: Diagnosis not present

## 2017-09-30 DIAGNOSIS — I129 Hypertensive chronic kidney disease with stage 1 through stage 4 chronic kidney disease, or unspecified chronic kidney disease: Secondary | ICD-10-CM | POA: Diagnosis not present

## 2017-09-30 DIAGNOSIS — N185 Chronic kidney disease, stage 5: Secondary | ICD-10-CM | POA: Diagnosis not present

## 2017-09-30 DIAGNOSIS — Z716 Tobacco abuse counseling: Secondary | ICD-10-CM | POA: Diagnosis not present

## 2017-09-30 HISTORY — PX: CYSTOSCOPY WITH STENT PLACEMENT: SHX5790

## 2017-09-30 LAB — COMPREHENSIVE METABOLIC PANEL
ALBUMIN: 3.3 g/dL — AB (ref 3.5–5.0)
ALT: 257 U/L — ABNORMAL HIGH (ref 17–63)
ANION GAP: 12 (ref 5–15)
AST: 60 U/L — ABNORMAL HIGH (ref 15–41)
Alkaline Phosphatase: 222 U/L — ABNORMAL HIGH (ref 38–126)
BUN: 73 mg/dL — ABNORMAL HIGH (ref 6–20)
CO2: 19 mmol/L — AB (ref 22–32)
Calcium: 8.6 mg/dL — ABNORMAL LOW (ref 8.9–10.3)
Chloride: 105 mmol/L (ref 101–111)
Creatinine, Ser: 10.22 mg/dL — ABNORMAL HIGH (ref 0.61–1.24)
GFR calc Af Amer: 6 mL/min — ABNORMAL LOW (ref >60)
GFR calc non Af Amer: 5 mL/min — ABNORMAL LOW (ref >60)
GLUCOSE: 107 mg/dL — AB (ref 65–99)
POTASSIUM: 4.7 mmol/L (ref 3.5–5.1)
Sodium: 136 mmol/L (ref 135–145)
TOTAL PROTEIN: 6.6 g/dL (ref 6.5–8.1)
Total Bilirubin: 0.4 mg/dL (ref 0.3–1.2)

## 2017-09-30 LAB — URINALYSIS, COMPLETE (UACMP) WITH MICROSCOPIC
BACTERIA UA: NONE SEEN
BILIRUBIN URINE: NEGATIVE
Glucose, UA: 50 mg/dL — AB
KETONES UR: NEGATIVE mg/dL
Leukocytes, UA: NEGATIVE
NITRITE: NEGATIVE
PROTEIN: 100 mg/dL — AB
SPECIFIC GRAVITY, URINE: 1.013 (ref 1.005–1.030)
Squamous Epithelial / LPF: NONE SEEN
pH: 5 (ref 5.0–8.0)

## 2017-09-30 LAB — CBC
HEMATOCRIT: 37.3 % — AB (ref 40.0–52.0)
HEMOGLOBIN: 12.6 g/dL — AB (ref 13.0–18.0)
MCH: 31.5 pg (ref 26.0–34.0)
MCHC: 33.9 g/dL (ref 32.0–36.0)
MCV: 93.1 fL (ref 80.0–100.0)
Platelets: 259 10*3/uL (ref 150–440)
RBC: 4.01 MIL/uL — ABNORMAL LOW (ref 4.40–5.90)
RDW: 13.1 % (ref 11.5–14.5)
WBC: 8.5 10*3/uL (ref 3.8–10.6)

## 2017-09-30 LAB — LIPASE, BLOOD: Lipase: 46 U/L (ref 11–51)

## 2017-09-30 SURGERY — CYSTOSCOPY, WITH STENT INSERTION
Anesthesia: General | Site: Ureter | Laterality: Right | Wound class: Clean Contaminated

## 2017-09-30 MED ORDER — SODIUM CHLORIDE 0.9 % IV SOLN
INTRAVENOUS | Status: DC | PRN
  Administered 2017-09-30: 17:00:00 via INTRAVENOUS

## 2017-09-30 MED ORDER — MORPHINE SULFATE (PF) 2 MG/ML IV SOLN
2.0000 mg | INTRAVENOUS | Status: DC | PRN
Start: 2017-09-30 — End: 2017-10-01
  Administered 2017-09-30: 2 mg via INTRAVENOUS
  Filled 2017-09-30: qty 1

## 2017-09-30 MED ORDER — PHENYLEPHRINE HCL 10 MG/ML IJ SOLN
INTRAMUSCULAR | Status: DC | PRN
  Administered 2017-09-30: 50 ug via INTRAVENOUS
  Administered 2017-09-30: 100 ug via INTRAVENOUS

## 2017-09-30 MED ORDER — CEFAZOLIN SODIUM 1 G IJ SOLR
INTRAMUSCULAR | Status: AC
  Filled 2017-09-30: qty 20

## 2017-09-30 MED ORDER — SODIUM CHLORIDE 0.9 % IV BOLUS (SEPSIS)
500.0000 mL | Freq: Once | INTRAVENOUS | Status: AC
  Administered 2017-09-30: 500 mL via INTRAVENOUS

## 2017-09-30 MED ORDER — OXYCODONE-ACETAMINOPHEN 5-325 MG PO TABS
1.0000 | ORAL_TABLET | ORAL | Status: DC | PRN
  Administered 2017-09-30: 1 via ORAL
  Filled 2017-09-30: qty 1

## 2017-09-30 MED ORDER — HYDRALAZINE HCL 25 MG PO TABS
25.0000 mg | ORAL_TABLET | Freq: Every day | ORAL | Status: DC
  Administered 2017-09-30: 25 mg via ORAL
  Filled 2017-09-30: qty 1

## 2017-09-30 MED ORDER — FAMOTIDINE IN NACL 20-0.9 MG/50ML-% IV SOLN
20.0000 mg | Freq: Two times a day (BID) | INTRAVENOUS | Status: DC
  Administered 2017-09-30: 20 mg via INTRAVENOUS
  Filled 2017-09-30 (×4): qty 50

## 2017-09-30 MED ORDER — ONDANSETRON HCL 4 MG/2ML IJ SOLN
INTRAMUSCULAR | Status: AC
  Filled 2017-09-30: qty 2

## 2017-09-30 MED ORDER — PROPOFOL 10 MG/ML IV BOLUS
INTRAVENOUS | Status: DC | PRN
  Administered 2017-09-30: 150 mg via INTRAVENOUS

## 2017-09-30 MED ORDER — DEXAMETHASONE SODIUM PHOSPHATE 10 MG/ML IJ SOLN
INTRAMUSCULAR | Status: DC | PRN
  Administered 2017-09-30: 5 mg via INTRAVENOUS

## 2017-09-30 MED ORDER — FENTANYL CITRATE (PF) 100 MCG/2ML IJ SOLN
INTRAMUSCULAR | Status: AC
  Filled 2017-09-30: qty 2

## 2017-09-30 MED ORDER — DEXAMETHASONE SODIUM PHOSPHATE 10 MG/ML IJ SOLN
INTRAMUSCULAR | Status: AC
  Filled 2017-09-30: qty 1

## 2017-09-30 MED ORDER — PROPOFOL 10 MG/ML IV BOLUS
INTRAVENOUS | Status: AC
  Filled 2017-09-30: qty 20

## 2017-09-30 MED ORDER — CEFAZOLIN SODIUM-DEXTROSE 2-3 GM-%(50ML) IV SOLR: INTRAVENOUS | Status: DC | PRN

## 2017-09-30 MED ORDER — METOPROLOL SUCCINATE ER 100 MG PO TB24
100.0000 mg | ORAL_TABLET | Freq: Every day | ORAL | Status: DC
  Administered 2017-09-30: 100 mg via ORAL
  Filled 2017-09-30: qty 1

## 2017-09-30 MED ORDER — SERTRALINE HCL 50 MG PO TABS
25.0000 mg | ORAL_TABLET | Freq: Every day | ORAL | Status: DC
  Administered 2017-09-30: 25 mg via ORAL
  Filled 2017-09-30: qty 1

## 2017-09-30 MED ORDER — SEVOFLURANE IN SOLN
RESPIRATORY_TRACT | Status: AC
  Filled 2017-09-30: qty 250

## 2017-09-30 MED ORDER — PROMETHAZINE HCL 25 MG/ML IJ SOLN: 6.2500 mg | INTRAMUSCULAR | Status: DC | PRN

## 2017-09-30 MED ORDER — LIDOCAINE HCL (CARDIAC) 20 MG/ML IV SOLN
INTRAVENOUS | Status: DC | PRN
  Administered 2017-09-30: 60 mg via INTRAVENOUS

## 2017-09-30 MED ORDER — MIDAZOLAM HCL 5 MG/5ML IJ SOLN
INTRAMUSCULAR | Status: DC | PRN
  Administered 2017-09-30: 2 mg via INTRAVENOUS

## 2017-09-30 MED ORDER — FENTANYL CITRATE (PF) 100 MCG/2ML IJ SOLN: 25.0000 ug | INTRAMUSCULAR | Status: DC | PRN

## 2017-09-30 MED ORDER — MIDAZOLAM HCL 2 MG/2ML IJ SOLN
INTRAMUSCULAR | Status: AC
  Filled 2017-09-30: qty 2

## 2017-09-30 MED ORDER — SODIUM BICARBONATE 650 MG PO TABS
1300.0000 mg | ORAL_TABLET | Freq: Two times a day (BID) | ORAL | Status: DC
  Administered 2017-09-30 (×2): 1300 mg via ORAL
  Filled 2017-09-30 (×2): qty 2

## 2017-09-30 MED ORDER — FENTANYL CITRATE (PF) 100 MCG/2ML IJ SOLN
INTRAMUSCULAR | Status: DC | PRN
  Administered 2017-09-30 (×4): 25 ug via INTRAVENOUS

## 2017-09-30 MED ORDER — ONDANSETRON HCL 4 MG/2ML IJ SOLN: 4.0000 mg | Freq: Four times a day (QID) | INTRAMUSCULAR | Status: DC | PRN

## 2017-09-30 MED ORDER — CEFAZOLIN SODIUM-DEXTROSE 2-3 GM-%(50ML) IV SOLR
INTRAVENOUS | Status: DC | PRN
  Administered 2017-09-30: 2 g via INTRAVENOUS

## 2017-09-30 MED ORDER — ONDANSETRON HCL 4 MG/2ML IJ SOLN
INTRAMUSCULAR | Status: DC | PRN
  Administered 2017-09-30: 4 mg via INTRAVENOUS

## 2017-09-30 MED ORDER — SODIUM CHLORIDE 0.9 % IV SOLN
INTRAVENOUS | Status: DC
  Administered 2017-09-30 (×2): via INTRAVENOUS

## 2017-09-30 MED ORDER — TAMSULOSIN HCL 0.4 MG PO CAPS
0.4000 mg | ORAL_CAPSULE | Freq: Every day | ORAL | Status: DC
  Administered 2017-09-30: 0.4 mg via ORAL
  Filled 2017-09-30: qty 1

## 2017-09-30 MED ORDER — ONDANSETRON HCL 4 MG PO TABS: 4.0000 mg | ORAL_TABLET | Freq: Four times a day (QID) | ORAL | Status: DC | PRN

## 2017-09-30 SURGICAL SUPPLY — 22 items
BAG DRAIN CYSTO-URO LG1000N (MISCELLANEOUS) ×2 IMPLANT
BRUSH SCRUB EZ  4% CHG (MISCELLANEOUS)
BRUSH SCRUB EZ 4% CHG (MISCELLANEOUS) IMPLANT
CATH URETL 5X70 OPEN END (CATHETERS) ×2 IMPLANT
CONRAY 43 FOR UROLOGY 50M (MISCELLANEOUS) ×2 IMPLANT
GLOVE BIOGEL PI IND STRL 8 (GLOVE) ×1 IMPLANT
GLOVE BIOGEL PI INDICATOR 8 (GLOVE) ×1
GOWN STANDARD XL  REUSABL (MISCELLANEOUS) ×2 IMPLANT
KIT RM TURNOVER CYSTO AR (KITS) ×2 IMPLANT
PACK CYSTO AR (MISCELLANEOUS) ×2 IMPLANT
SENSORWIRE 0.038 NOT ANGLED (WIRE) ×4
SET CYSTO W/LG BORE CLAMP LF (SET/KITS/TRAYS/PACK) ×2 IMPLANT
SOL .9 NS 3000ML IRR  AL (IV SOLUTION) ×1
SOL .9 NS 3000ML IRR AL (IV SOLUTION) ×1
SOL .9 NS 3000ML IRR UROMATIC (IV SOLUTION) ×1 IMPLANT
STENT URET 6FRX24 CONTOUR (STENTS) ×1 IMPLANT
STENT URET 6FRX26 CONTOUR (STENTS) IMPLANT
SURGILUBE 2OZ TUBE FLIPTOP (MISCELLANEOUS) ×2 IMPLANT
SYRINGE IRR TOOMEY STRL 70CC (SYRINGE) ×2 IMPLANT
WATER STERILE IRR 1000ML POUR (IV SOLUTION) ×2 IMPLANT
WIRE SENSOR 0.038 NOT ANGLED (WIRE) ×1 IMPLANT
ureteral stent ×1 IMPLANT

## 2017-09-30 NOTE — Interval H&P Note (Signed)
History and Physical Interval Note:  09/30/2017 4:38 PM  Larry [name]  has presented today for surgery, with the diagnosis of na  The various methods of treatment have been discussed with the patient and family. After consideration of risks, benefits and other options for treatment, the patient has consented to  Procedure(s): [place] (N/A) as a surgical intervention .  The patient's history has been reviewed, patient examined, no change in status, stable for surgery.  I have reviewed the patient's chart and labs.  Questions were answered to the patient's satisfaction.     [place]

## 2017-09-30 NOTE — Anesthesia Post-op Follow-up Note (Signed)
Anesthesia QCDR form completed.        

## 2017-09-30 NOTE — ED Triage Notes (Signed)
Pt to ed with c/o flank pain, dx with kidney stone on Saturday.  Pt states he has been taking pain meds for same however, states he continues to have pain despite use of pain meds.  Also reports has not had a BM since Friday am.

## 2017-09-30 NOTE — Transfer of Care (Signed)
Immediate Anesthesia Transfer of Care Note  Patient: Larry [name]  Procedure(s) Performed: CYSTOSCOPY WITH STENT PLACEMENT (Right Ureter)  Patient Location: PACU  Anesthesia Type:General  Level of Consciousness: sedated  Airway & Oxygen Therapy: Patient Spontanous Breathing and Patient connected to face mask oxygen  Post-op Assessment: Report given to RN and Post -op Vital signs reviewed and stable  Post vital signs: Reviewed and stable  Last Vitals:  Vitals:   09/30/17 1623 09/30/17 1732  BP: (!) 171/94 (!) 100/58  Pulse: 65 64  Resp: 14 14  Temp: 37.1 C 36.8 C  SpO2: 100% 100%    Last Pain:  Vitals:   09/30/17 1732  TempSrc:   PainSc: Asleep         Complications: No apparent anesthesia complications

## 2017-09-30 NOTE — Anesthesia Preprocedure Evaluation (Signed)
Anesthesia Evaluation  Patient identified by MRN, date of birth, ID band Patient awake    Reviewed: Allergy & Precautions, H&P , NPO status , Patient's Chart, lab work & pertinent test results, reviewed documented beta blocker date and time   History of Anesthesia Complications Negative for: history of anesthetic complications  Airway Mallampati: III  TM Distance: >3 FB Neck ROM: full    Dental  (+) Poor Dentition, Missing, Dental Advidsory Given   Pulmonary neg shortness of breath, neg sleep apnea, neg COPD, neg recent URI, Current Smoker,           Cardiovascular Exercise Tolerance: Good hypertension, (-) angina(-) CAD, (-) Past MI, (-) Cardiac Stents and (-) CABG (-) dysrhythmias (-) Valvular Problems/Murmurs     Neuro/Psych negative neurological ROS  negative psych ROS   GI/Hepatic negative GI ROS, Neg liver ROS,   Endo/Other  negative endocrine ROS  Renal/GU CRF and ARFRenal disease  negative genitourinary   Musculoskeletal   Abdominal   Peds  Hematology negative hematology ROS (+)   Anesthesia Other Findings Past Medical History: No date: Chronic kidney disease No date: Hypertension  Patient denies nausea, last vomited yesterday, appropriately NPO.  Reproductive/Obstetrics negative OB ROS                             Anesthesia Physical Anesthesia Plan  ASA: III and emergent  Anesthesia Plan: General   Post-op Pain Management:    Induction: Intravenous  PONV Risk Score and Plan: 1 and Ondansetron and Dexamethasone  Airway Management Planned: LMA  Additional Equipment:   Intra-op Plan:   Post-operative Plan: Extubation in OR  Informed Consent: I have reviewed the patients History and Physical, chart, labs and discussed the procedure including the risks, benefits and alternatives for the proposed anesthesia with the patient or authorized representative who has indicated  his/her understanding and acceptance.   Dental Advisory Given  Plan Discussed with: Anesthesiologist, CRNA and Surgeon  Anesthesia Plan Comments:         Anesthesia Quick Evaluation

## 2017-09-30 NOTE — ED Notes (Signed)
FN: dx with kidney stone this past Saturday, right side pain and no bowel movement since last Friday.

## 2017-09-30 NOTE — H&P (Signed)
Larry [name] at [facility] NAME: Larry [name]    MR#:  [id number]  DATE OF BIRTH:  23-Nov-1965  DATE OF ADMISSION:  09/30/2017  PRIMARY CARE PHYSICIAN: Larry Kitchens, MD   REQUESTING/REFERRING PHYSICIAN: Orbie Pyo, MD  CHIEF COMPLAINT:  Rt Flank pain  HISTORY OF PRESENT ILLNESS:  Larry [name]  is a 52 y.o. male with a known history of  CKD, recent creatinine at 5.5 seen by Dr. Holley Raring as an outpatient and hypertension is presenting to the emergency department with right-sided flank pain.  Patient was diagnosed with possible kidney stone on January 5 and he was given pain medications sent.  Patient is presenting today with sharp and severe right-sided flank pain for 5-6 hours associated with nausea and vomiting today.  CT scan on January 6 has revealed right-sided hydroureteronephrosis, recommending cystoscopy.  ED physician has discussed with on-call urologist was recommended to admit the patient and cystoscopy today.  PAST MEDICAL HISTORY:   Past Medical History:  Diagnosis Date  . Chronic kidney disease   . Hypertension     PAST SURGICAL HISTOIRY:   Past Surgical History:  Procedure Laterality Date  . CHOLECYSTECTOMY    . RENAL BIOPSY      SOCIAL HISTORY:   Social History   Tobacco Use  . Smoking status: Current Every Day Smoker    Packs/day: 1.00    Years: 20.00    Pack years: 20.00    Types: Cigarettes  . Smokeless tobacco: Never Used  Substance Use Topics  . Alcohol use: Yes    Comment: occ. alcohol use    FAMILY HISTORY:   Family History  Problem Relation Age of Onset  . Diabetes Mother   . Kidney disease Mother        mother on dialysis  . CAD Mother   . Diabetes Sister   . CAD Sister     DRUG ALLERGIES:   Allergies  Allergen Reactions  . Amoxicillin Nausea And Vomiting    REVIEW OF SYSTEMS:  CONSTITUTIONAL: No fever, fatigue or weakness.  EYES: No blurred or double vision.   EARS, NOSE, AND THROAT: No tinnitus or ear pain.  RESPIRATORY: No cough, shortness of breath, wheezing or hemoptysis.  CARDIOVASCULAR: No chest pain, orthopnea, edema.  GASTROINTESTINAL: Reports nausea, vomiting, right flank pain, denies diarrhea  GENITOURINARY: No dysuria, hematuria.  ENDOCRINE: No polyuria, nocturia,  HEMATOLOGY: No anemia, easy bruising or bleeding SKIN: No rash or lesion. MUSCULOSKELETAL: No joint pain or arthritis.   NEUROLOGIC: No tingling, numbness, weakness.  PSYCHIATRY: No anxiety or depression.   MEDICATIONS AT HOME:   Prior to Admission medications   Medication Sig Start Date End Date Taking? Authorizing Provider  famotidine (PEPCID) 20 MG tablet Take 1 tablet (20 mg total) by mouth 2 (two) times daily. 09/27/17 09/27/18 Yes Darel Hong, MD  hydrALAZINE (APRESOLINE) 25 MG tablet Take 25 mg by mouth daily.    Yes [provider]  metoprolol succinate (TOPROL-XL) 100 MG 24 hr tablet Take 100 mg by mouth daily.   Yes [provider]  oxyCODONE-acetaminophen (ROXICET) 5-325 MG tablet Take 1 tablet by mouth every 6 (six) hours as needed for severe pain. 09/27/17  Yes Darel Hong, MD  sertraline (ZOLOFT) 25 MG tablet Take 1 tablet by mouth daily. 09/25/17  Yes [provider]  tamsulosin (FLOMAX) 0.4 MG CAPS capsule Take 1 capsule by mouth daily. 09/29/17  Yes [provider]  ondansetron (ZOFRAN ODT)  4 MG disintegrating tablet Take 1 tablet (4 mg total) by mouth every 8 (eight) hours as needed for nausea or vomiting. 03/09/17   Eula Listen, MD  ondansetron (ZOFRAN ODT) 4 MG disintegrating tablet Take 1 tablet (4 mg total) by mouth every 8 (eight) hours as needed for nausea or vomiting. Patient not taking: Reported on 09/30/2017 09/27/17   Darel Hong, MD  sodium bicarbonate 650 MG tablet Take 1,300 mg by mouth 2 (two) times daily.    [provider]      VITAL SIGNS:  Blood pressure (!) 171/94, pulse 65,  temperature 98.7 F (37.1 C), temperature source Oral, resp. rate 14, height 5\' 6"  (1.676 m), weight 72.6 kg (160 lb), SpO2 100 %.  PHYSICAL EXAMINATION:  GENERAL:  52 y.o.-year-old patient lying in the bed with no acute distress.  EYES: Pupils equal, round, reactive to light and accommodation. No scleral icterus. Extraocular muscles intact.  HEENT: Head atraumatic, normocephalic. Oropharynx and nasopharynx clear.  NECK:  Supple, no jugular venous distention. No thyroid enlargement, no tenderness.  LUNGS: Normal breath sounds bilaterally, no wheezing, rales,rhonchi or crepitation. No use of accessory muscles of respiration.  CARDIOVASCULAR: S1, S2 normal. No murmurs, rubs, or gallops.  ABDOMEN: Soft, right flank is tender, no rebound tenderness nondistended. Bowel sounds present.  EXTREMITIES: No pedal edema, cyanosis, or clubbing.  NEUROLOGIC: Cranial nerves II through XII are intact. Muscle strength 5/5 in all extremities. Sensation intact. Gait not checked.  PSYCHIATRIC: The patient is alert and oriented x 3.  SKIN: No obvious rash, lesion, or ulcer.   LABORATORY PANEL:   CBC Recent Labs  Lab 09/30/17 1026  WBC 8.5  HGB 12.6*  HCT 37.3*  PLT 259   ------------------------------------------------------------------------------------------------------------------  Chemistries  Recent Labs  Lab 09/30/17 1026  NA 136  K 4.7  CL 105  CO2 19*  GLUCOSE 107*  BUN 73*  CREATININE 10.22*  CALCIUM 8.6*  AST 60*  ALT 257*  ALKPHOS 222*  BILITOT 0.4   ------------------------------------------------------------------------------------------------------------------  Cardiac Enzymes No results for input(s): TROPONINI in the last 168 hours. ------------------------------------------------------------------------------------------------------------------  RADIOLOGY:  US Renal  Result Date: 09/30/2017 CLINICAL DATA:  Hydronephrosis on CT EXAM: RENAL / URINARY TRACT ULTRASOUND  COMPLETE COMPARISON:  CT abdomen and pelvis 09/27/2017 FINDINGS: Right Kidney: Length: 11.1 cm. Minimal cortical thinning. Borderline increased cortical echogenicity. No mass or shadowing calcification. Mild hydronephrosis present. Visualized portion of the proximal RIGHT ureter appears dilated. Left Kidney: Length: 9.8 cm. Cortical thinning. Upper normal cortical echogenicity. No mass, hydronephrosis or shadowing calcification. Bladder: Appears normal for degree of bladder distention. LEFT ureteral jet was visualized. RIGHT ureteral jet was not seen during imaging. IMPRESSION: Mild RIGHT hydronephrosis and hydroureter with nonvisualization of a RIGHT ureteral jet at the urinary bladder. Mild BILATERAL renal cortical thinning. Electronically Signed   By: [name] M.D.   On: 09/30/2017 13:27    EKG:   Orders placed or performed during the hospital encounter of 03/09/17  . ED EKG  . ED EKG  . EKG 12-Lead  . EKG 12-Lead  . EKG    IMPRESSION AND PLAN:   Larry [name]  is a 52 y.o. male with a known history of  CKD, recent creatinine at 5.5 seen by Dr. Holley Raring as an outpatient and hypertension is presenting to the emergency department with right-sided flank pain.  Patient was diagnosed with possible kidney stone on January 5 and he was given pain medications sent.  Patient is presenting today with sharp and  severe right-sided flank pain for 5-6 hours associated with nausea and vomiting   #Acute right flank pain secondary to right-sided hydroureteronephrosis Admit to MedSurg unit N.p.o., IV fluids Scheduled for cystoscopy with stent placement today Pain management as needed  #Acute kidney injury on chronic kidney disease Hydrate with IV fluids Scheduled for cystoscopy with stent placement today by urology Follow-up with urology Consult placed to nephrology as patient is Dr. Holley Raring as an outpatient for chronic kidney disease Avoid nephrotoxins Creatinine at 10.2 but potassium within normal  range  #Essential hypertension Continue home medication hydralazine and Toprol and titrate as needed  #Tobacco abuse disorder Counseled patient to quit smoking for 4-5 minutes.  Patient reports he is planning to quit and not considering nicotine patch at this time  DVT prophylaxis with SCDs as patient is up for surgery today  All the records are reviewed and case discussed with ED provider. Management plans discussed with the patient, family and they are in agreement.  CODE STATUS: FC,fiancee Janice HCPOA  TOTAL TIME TAKING CARE OF THIS PATIENT: 45  minutes.   Note: This dictation was prepared with Dragon dictation along with smaller phrase technology. Any transcriptional errors that result from this process are unintentional.  Nicholes Mango M.D on 09/30/2017 at 5:04 PM  Between 7am to 6pm - Pager - [phone]  After 6pm go to [email or web address] - password [email or web address] [facility]  [facility]  Office  [phone]  CC: Primary care physician; Larry Kitchens, MD

## 2017-09-30 NOTE — Consult Note (Signed)
Urology Consult  I have been asked to see the patient by Dr. Margaretmary Eddy, for evaluation and management of right hydronephrosis with acute kidney injury.  Chief Complaint: Abdominal pain  History of Present Illness: Larry [name] is a 52 y.o. year old male with chronic kidney disease with a baseline creatinine of 4-5.  He presented to the ED on 09/27/2017 with a 3-day history of right flank pain radiating to the right groin region.  There are no identifiable precipitating, aggravating or alleviating factors.  He had noted nausea with watery diarrhea.  A noncontrast CT of the abdomen pelvis was performed which showed moderate hydronephrosis and hydroureter to the right UVJ however no stone was identified.  His pain was improved with narcotic analgesics and he was discharged with instructions of follow-up on 1/9.  He returned today still complaining of intermittent right flank pain radiating to the right lower quadrant.  There are no identifiable precipitating, aggravating or alleviating factors.  His pain was episodic occurring every 5-6 hours and was described as sharp and stabbing.  He has had worsening nausea with emesis.  He has a sensation of needing to have a bowel movement.  Repeat creatinine today had increased to 10.22 and he was admitted for further evaluation and management.  Potassium was normal at 4.7.  A CT performed in June 2018 showed no hydronephrosis.  Past Medical History:  Diagnosis Date  . Chronic kidney disease   . Hypertension     Past Surgical History:  Procedure Laterality Date  . CHOLECYSTECTOMY    . RENAL BIOPSY      Home Medications:  Current Meds  Medication Sig  . famotidine (PEPCID) 20 MG tablet Take 1 tablet (20 mg total) by mouth 2 (two) times daily.  . hydrALAZINE (APRESOLINE) 25 MG tablet Take 25 mg by mouth daily.   . metoprolol succinate (TOPROL-XL) 100 MG 24 hr tablet Take 100 mg by mouth daily.  Marland Kitchen oxyCODONE-acetaminophen (ROXICET) 5-325 MG tablet  Take 1 tablet by mouth every 6 (six) hours as needed for severe pain.  Marland Kitchen sertraline (ZOLOFT) 25 MG tablet Take 1 tablet by mouth daily.  . tamsulosin (FLOMAX) 0.4 MG CAPS capsule Take 1 capsule by mouth daily.    Allergies:  Allergies  Allergen Reactions  . Amoxicillin Nausea And Vomiting    Family History  Problem Relation Age of Onset  . Diabetes Mother   . Kidney disease Mother        mother on dialysis  . CAD Mother   . Diabetes Sister   . CAD Sister     Social History:  reports that he has been smoking cigarettes.  He has a 20.00 pack-year smoking history. he has never used smokeless tobacco. He reports that he drinks alcohol. He reports that he does not use drugs.  ROS: A complete review of systems was performed.  All systems are negative except for pertinent findings as noted.  Physical Exam:  Vital signs in last 24 hours: Temp:  [97.6 F (36.4 C)-98.7 F (37.1 C)] 98.7 F (37.1 C) (01/09 1623) Pulse Rate:  [58-80] 65 (01/09 1623) Resp:  [14-16] 14 (01/09 1623) BP: (158-186)/(94-109) 171/94 (01/09 1623) SpO2:  [98 %-100 %] 100 % (01/09 1623) Weight:  [160 lb (72.6 kg)] 160 lb (72.6 kg) (01/09 1026) Constitutional:  Alert and oriented, No acute distress HEENT: [place] AT, moist mucus membranes.  Trachea midline, no masses Cardiovascular: Regular rate and rhythm, no clubbing, cyanosis, or edema. Respiratory:  Normal respiratory effort, lungs clear bilaterally GI: Abdomen is soft, mild right lower quadrant tenderness, no abdominal masses GU: No CVA tenderness Skin: No rashes, bruises or suspicious lesions Lymph: No cervical or inguinal adenopathy Neurologic: Grossly intact, no focal deficits, moving all 4 extremities Psychiatric: Normal mood and affect   Laboratory Data:  Recent Labs    09/30/17 1026  WBC 8.5  HGB 12.6*  HCT 37.3*   Recent Labs    09/30/17 1026  NA 136  K 4.7  CL 105  CO2 19*  GLUCOSE 107*  BUN 73*  CREATININE 10.22*  CALCIUM 8.6*     Radiologic Imaging: CT and renal ultrasound were reviewed.  US Renal  Result Date: 09/30/2017 CLINICAL DATA:  Hydronephrosis on CT EXAM: RENAL / URINARY TRACT ULTRASOUND COMPLETE COMPARISON:  CT abdomen and pelvis 09/27/2017 FINDINGS: Right Kidney: Length: 11.1 cm. Minimal cortical thinning. Borderline increased cortical echogenicity. No mass or shadowing calcification. Mild hydronephrosis present. Visualized portion of the proximal RIGHT ureter appears dilated. Left Kidney: Length: 9.8 cm. Cortical thinning. Upper normal cortical echogenicity. No mass, hydronephrosis or shadowing calcification. Bladder: Appears normal for degree of bladder distention. LEFT ureteral jet was visualized. RIGHT ureteral jet was not seen during imaging. IMPRESSION: Mild RIGHT hydronephrosis and hydroureter with nonvisualization of a RIGHT ureteral jet at the urinary bladder. Mild BILATERAL renal cortical thinning. Electronically Signed   By: [name] M.D.   On: 09/30/2017 13:27    Impression/Assessment:  52 year old male with chronic kidney disease with new onset right hydronephrosis/hydroureter and significant rising creatinine.  No definite stone seen on CT.  Plan:  I have recommended cystoscopy with right retrograde pyelogram and placement of a right ureteral stent.  The procedure was discussed in detail including potential risks of bleeding, infection and ureteral injury.  We discussed the possibility of obstruction precluding stent placement and in that event he would require percutaneous nephrostomy.  He indicated all questions were answered and desires to proceed.  09/30/2017, 4:23 PM  [name],  MD   Thank you for involving me in this patient's care, I will continue to follow along.  Please page with any further questions or concerns.  [place]

## 2017-09-30 NOTE — Op Note (Signed)
Preoperative diagnosis:  1. Right hydronephrosis/hydroureter 2. Acute on chronic renal failure  Postoperative diagnosis:  1. Same  Procedure:  1. Cystoscopy 2. Right ureteral stent placement  3. Right retrograde pyelography with interpretation  Surgeon: Nicki Reaper C. Fay Swider, M.D.  Anesthesia: General  Complications: None  Intraoperative findings:  1.  Right retrograde pyelogram-mild-moderate right hydronephrosis/hydroureter to the UVJ with tapering.  No filling defect seen  EBL: Minimal  Specimens: None  Indication: Larry [name] is a 52 y.o. patient with chronic kidney disease and a baseline creatinine of 4-5.  He was seen in the ED on 6/9 with a 3-day history of right flank/groin pain and a CT showed moderate right hydronephrosis and hydroureter without evidence of a ureteral calculus.  Follow-up ED visit remarkable for a rise in his creatinine to 10.22 and persistent pain with increased nausea and vomiting. After reviewing the management options for treatment, he elected to proceed with the above surgical procedure(s). We have discussed the potential benefits and risks of the procedure, side effects of the proposed treatment, the likelihood of the patient achieving the goals of the procedure, and any potential problems that might occur during the procedure or recuperation. Informed consent has been obtained.  Description of procedure:  The patient was taken to the operating room and general anesthesia was induced.  The patient was placed in the dorsal lithotomy position, prepped and draped in the usual sterile fashion, and preoperative antibiotics were administered. A preoperative time-out was performed.   Cystourethroscopy was performed.  The patient's urethra was examined and was demonstrated mild prostatic enlargement with mild bladder neck elevation. The bladder was then systematically examined in its entirety. There was no evidence for any bladder tumors, stones, or other  mucosal pathology.  The left ureteral orifice was normal in appearance with clear efflux.  No efflux was seen from the right ureteral orifice and the intramural portion of the ureter appeared dilated.  Attention then turned to the right ureteral orifice and a 0.038 Sensor wire was placed in the right ureteral orifice and advanced proximally without difficulty.  A 6 French ureteral catheter was advanced over the wire and the guidewire was removed.  Omnipaque contrast was injected through the ureteral catheter and a retrograde pyelogram was performed with findings as dictated above.  The 0.38 sensor guidewire was then placed back into the ureteral catheter and the ureteral catheter was removed.  The guidewire was advanced up the right ureter into the renal pelvis under fluoroscopic guidance.  A 6 French/24 cm double-J ureteral stent was advanced over the wire using Seldinger technique.  The stent was positioned appropriately under fluoroscopic and cystoscopic guidance.  The wire was then removed with an adequate stent curl noted in the renal pelvis as well as in the bladder.  Brisk efflux of urine through the stent was noted.  The bladder was then emptied and the procedure ended.  The patient appeared to tolerate the procedure well and without complications.  The patient was able to be awakened and transferred to the recovery unit in satisfactory condition.

## 2017-09-30 NOTE — ED Provider Notes (Signed)
[facility] Emergency Department Provider Note  ____________________________________________   First MD Initiated Contact with Patient 09/30/17 1130     (approximate)  I have reviewed the triage vital signs and the nursing notes.   HISTORY  Chief Complaint Flank Pain   HPI Larry [name] is a 52 y.o. male with a history of chronic disease as well as hypertension was presented to the emergency department with recurrent right lower quadrant abdominal pain as well as inability to move his bowels.  He was diagnosed with a likely kidney stone on 5 January and since then has had recurrent right lower quadrant abdominal pain.  He says that the pain is sharp and severe and coming every 5-6 hours.  He says that it is now associated with nausea and vomiting.  Says that he thinks that if he could just move his bowels he would feel much better.  However, he does not have the sensation of having to move his bowels or that there is something stuck just at the rectum.  Denies any pain at this time.   Past Medical History:  Diagnosis Date  . Chronic kidney disease   . Hypertension     Patient Active Problem List   Diagnosis Date Noted  . even 1 per today and did not I would not with an order 02/07/2015    Past Surgical History:  Procedure Laterality Date  . CHOLECYSTECTOMY    . RENAL BIOPSY      Prior to Admission medications   Medication Sig Start Date End Date Taking? Authorizing Provider  famotidine (PEPCID) 20 MG tablet Take 1 tablet (20 mg total) by mouth 2 (two) times daily. 09/27/17 09/27/18  Darel Hong, MD  hydrALAZINE (APRESOLINE) 25 MG tablet Take 25 mg by mouth daily.     [provider]  losartan (COZAAR) 25 MG tablet Take 25 mg by mouth daily.    [provider]  metoprolol succinate (TOPROL-XL) 100 MG 24 hr tablet Take 100 mg by mouth daily.    [provider]  ondansetron (ZOFRAN ODT) 4 MG disintegrating tablet Take 1  tablet (4 mg total) by mouth every 8 (eight) hours as needed for nausea or vomiting. 03/09/17   Eula Listen, MD  ondansetron (ZOFRAN ODT) 4 MG disintegrating tablet Take 1 tablet (4 mg total) by mouth every 8 (eight) hours as needed for nausea or vomiting. 09/27/17   Darel Hong, MD  oxyCODONE-acetaminophen (ROXICET) 5-325 MG tablet Take 1 tablet by mouth every 6 (six) hours as needed for severe pain. 09/27/17   Darel Hong, MD  sodium bicarbonate 650 MG tablet Take 1,300 mg by mouth 2 (two) times daily.    [provider]    Allergies Amoxicillin  Family History  Problem Relation Age of Onset  . Diabetes Mother   . Kidney disease Mother        mother on dialysis  . CAD Mother   . Diabetes Sister   . CAD Sister     Social History Social History   Tobacco Use  . Smoking status: Current Every Day Smoker    Packs/day: 1.00    Years: 20.00    Pack years: 20.00    Types: Cigarettes  . Smokeless tobacco: Never Used  Substance Use Topics  . Alcohol use: Yes    Comment: occ. alcohol use  . Drug use: No    Review of Systems  Constitutional: No fever/chills Eyes: No visual changes. ENT: No sore throat.  Cardiovascular: Denies chest pain. Respiratory: Denies shortness of breath. Gastrointestinal: No diarrhea.  No constipation. Genitourinary: Negative for dysuria. Musculoskeletal: Negative for back pain. Skin: Negative for rash. Neurological: Negative for headaches, focal weakness or numbness.   ____________________________________________   PHYSICAL EXAM:  VITAL SIGNS: ED Triage Vitals  Enc Vitals Group     BP 09/30/17 1026 (!) 162/101     Pulse Rate 09/30/17 1026 80     Resp 09/30/17 1026 16     Temp 09/30/17 1026 98.3 F (36.8 C)     Temp Source 09/30/17 1026 Oral     SpO2 09/30/17 1026 98 %     Weight 09/30/17 1026 160 lb (72.6 kg)     Height --      Head Circumference --      Peak Flow --      Pain Score 09/30/17 1025 4     Pain Loc  --      Pain Edu? --      Excl. in [place]? --     Constitutional: Alert and oriented. Well appearing and in no acute distress. Eyes: Conjunctivae are normal.  Head: Atraumatic. Nose: No congestion/rhinnorhea. Mouth/Throat: Mucous membranes are moist.  Neck: No stridor.   Cardiovascular: Normal rate, regular rhythm. Grossly normal heart sounds.   Respiratory: Normal respiratory effort.  No retractions. Lungs CTAB. Gastrointestinal: Soft and nontender. No distention. No CVA tenderness. Musculoskeletal: No lower extremity tenderness nor edema.  No joint effusions. Neurologic:  Normal speech and language. No gross focal neurologic deficits are appreciated. Skin:  Skin is warm, dry and intact. No rash noted. Psychiatric: Mood and affect are normal. Speech and behavior are normal.  ____________________________________________   LABS (all labs ordered are listed, but only abnormal results are displayed)  Labs Reviewed  COMPREHENSIVE METABOLIC PANEL - Abnormal; Notable for the following components:      Result Value   CO2 19 (*)    Glucose, Bld 107 (*)    BUN 73 (*)    Creatinine, Ser 10.22 (*)    Calcium 8.6 (*)    Albumin 3.3 (*)    AST 60 (*)    ALT 257 (*)    Alkaline Phosphatase 222 (*)    GFR calc non Af Amer 5 (*)    GFR calc Af Amer 6 (*)    All other components within normal limits  CBC - Abnormal; Notable for the following components:   RBC 4.01 (*)    Hemoglobin 12.6 (*)    HCT 37.3 (*)    All other components within normal limits  URINALYSIS, COMPLETE (UACMP) WITH MICROSCOPIC - Abnormal; Notable for the following components:   Color, Urine STRAW (*)    APPearance CLEAR (*)    Glucose, UA 50 (*)    Hgb urine dipstick SMALL (*)    Protein, ur 100 (*)    All other components within normal limits  LIPASE, BLOOD    ____________________________________________  EKG   ____________________________________________  RADIOLOGY   ____________________________________________   PROCEDURES  Procedure(s) performed:   Procedures  Critical Care performed:   ____________________________________________   INITIAL IMPRESSION / ASSESSMENT AND PLAN / ED COURSE  Pertinent labs & imaging results that were available during my care of the patient were reviewed by me and considered in my medical decision making (see chart for details).  Differential diagnosis includes, but is not limited to, acute appendicitis, renal colic, testicular torsion, urinary tract infection/pyelonephritis, prostatitis,  epididymitis, diverticulitis, small bowel obstruction or ileus,  colitis, abdominal aortic aneurysm, gastroenteritis, hernia, etc. As part of my medical decision making, I reviewed the following data within the electronic MEDICAL RECORD NUMBER Notes from prior ED visits  ----------------------------------------- 11:59 AM on 09/30/2017 -----------------------------------------  Discussed the case with Dr. Bernardo Heater of urology who agrees with admission and urology consult with renal ultrasound.    ----------------------------------------- 12:25 PM on 09/30/2017 -----------------------------------------  Patient will be admitted to the medicine service.  Signed out to Dr. Margaretmary Eddy.  Patient pending ultrasound at this time.  Patient without any tenderness to palpation at this time her white blood cell count.  Presentation likely related to the patient's ongoing hydronephrosis and hydroureter.  Do not suspect appendicitis at this time given the constellation of history as well as acute renal failure with a normal white blood cell count.  ____________________________________________   FINAL CLINICAL IMPRESSION(S) / ED DIAGNOSES  Right lower quadrant pain.  Right flank pain.  Nausea and vomiting.  Acute on chronic renal  failure.    NEW MEDICATIONS STARTED DURING THIS VISIT:  This SmartLink is deprecated. Use AVSMEDLIST instead to display the medication list for a patient.   Note:  This document was prepared using Dragon voice recognition software and may include unintentional dictation errors.     Orbie Pyo, MD 09/30/17 [phone]

## 2017-09-30 NOTE — Anesthesia Procedure Notes (Signed)
Procedure Name: LMA Insertion Date/Time: 09/30/2017 4:55 PM Performed by: Dionne Bucy, CRNA Pre-anesthesia Checklist: Patient identified, Patient being monitored, Timeout performed, Emergency Drugs available and Suction available Patient Re-evaluated:Patient Re-evaluated prior to induction Oxygen Delivery Method: Circle system utilized Preoxygenation: Pre-oxygenation with 100% oxygen Induction Type: IV induction Ventilation: Mask ventilation without difficulty LMA: LMA inserted LMA Size: 4.0 Tube type: Oral Number of attempts: 1 Placement Confirmation: positive ETCO2 and breath sounds checked- equal and bilateral Tube secured with: Tape Dental Injury: Teeth and Oropharynx as per pre-operative assessment

## 2017-09-30 NOTE — H&P (View-Only) (Signed)
Urology Consult  I have been asked to see the patient by Dr. Margaretmary Eddy, for evaluation and management of right hydronephrosis with acute kidney injury.  Chief Complaint: Abdominal pain  History of Present Illness: Larry [name] is a 52 y.o. year old male with chronic kidney disease with a baseline creatinine of 4-5.  He presented to the ED on 09/27/2017 with a 3-day history of right flank pain radiating to the right groin region.  There are no identifiable precipitating, aggravating or alleviating factors.  He had noted nausea with watery diarrhea.  A noncontrast CT of the abdomen pelvis was performed which showed moderate hydronephrosis and hydroureter to the right UVJ however no stone was identified.  His pain was improved with narcotic analgesics and he was discharged with instructions of follow-up on 1/9.  He returned today still complaining of intermittent right flank pain radiating to the right lower quadrant.  There are no identifiable precipitating, aggravating or alleviating factors.  His pain was episodic occurring every 5-6 hours and was described as sharp and stabbing.  He has had worsening nausea with emesis.  He has a sensation of needing to have a bowel movement.  Repeat creatinine today had increased to 10.22 and he was admitted for further evaluation and management.  Potassium was normal at 4.7.  A CT performed in June 2018 showed no hydronephrosis.  Past Medical History:  Diagnosis Date  . Chronic kidney disease   . Hypertension     Past Surgical History:  Procedure Laterality Date  . CHOLECYSTECTOMY    . RENAL BIOPSY      Home Medications:  Current Meds  Medication Sig  . famotidine (PEPCID) 20 MG tablet Take 1 tablet (20 mg total) by mouth 2 (two) times daily.  . hydrALAZINE (APRESOLINE) 25 MG tablet Take 25 mg by mouth daily.   . metoprolol succinate (TOPROL-XL) 100 MG 24 hr tablet Take 100 mg by mouth daily.  Marland Kitchen oxyCODONE-acetaminophen (ROXICET) 5-325 MG tablet  Take 1 tablet by mouth every 6 (six) hours as needed for severe pain.  Marland Kitchen sertraline (ZOLOFT) 25 MG tablet Take 1 tablet by mouth daily.  . tamsulosin (FLOMAX) 0.4 MG CAPS capsule Take 1 capsule by mouth daily.    Allergies:  Allergies  Allergen Reactions  . Amoxicillin Nausea And Vomiting    Family History  Problem Relation Age of Onset  . Diabetes Mother   . Kidney disease Mother        mother on dialysis  . CAD Mother   . Diabetes Sister   . CAD Sister     Social History:  reports that he has been smoking cigarettes.  He has a 20.00 pack-year smoking history. he has never used smokeless tobacco. He reports that he drinks alcohol. He reports that he does not use drugs.  ROS: A complete review of systems was performed.  All systems are negative except for pertinent findings as noted.  Physical Exam:  Vital signs in last 24 hours: Temp:  [97.6 F (36.4 C)-98.7 F (37.1 C)] 98.7 F (37.1 C) (01/09 1623) Pulse Rate:  [58-80] 65 (01/09 1623) Resp:  [14-16] 14 (01/09 1623) BP: (158-186)/(94-109) 171/94 (01/09 1623) SpO2:  [98 %-100 %] 100 % (01/09 1623) Weight:  [160 lb (72.6 kg)] 160 lb (72.6 kg) (01/09 1026) Constitutional:  Alert and oriented, No acute distress HEENT: [place] AT, moist mucus membranes.  Trachea midline, no masses Cardiovascular: Regular rate and rhythm, no clubbing, cyanosis, or edema. Respiratory:  Normal respiratory effort, lungs clear bilaterally GI: Abdomen is soft, mild right lower quadrant tenderness, no abdominal masses GU: No CVA tenderness Skin: No rashes, bruises or suspicious lesions Lymph: No cervical or inguinal adenopathy Neurologic: Grossly intact, no focal deficits, moving all 4 extremities Psychiatric: Normal mood and affect   Laboratory Data:  Recent Labs    09/30/17 1026  WBC 8.5  HGB 12.6*  HCT 37.3*   Recent Labs    09/30/17 1026  NA 136  K 4.7  CL 105  CO2 19*  GLUCOSE 107*  BUN 73*  CREATININE 10.22*  CALCIUM 8.6*     Radiologic Imaging: CT and renal ultrasound were reviewed.  US Renal  Result Date: 09/30/2017 CLINICAL DATA:  Hydronephrosis on CT EXAM: RENAL / URINARY TRACT ULTRASOUND COMPLETE COMPARISON:  CT abdomen and pelvis 09/27/2017 FINDINGS: Right Kidney: Length: 11.1 cm. Minimal cortical thinning. Borderline increased cortical echogenicity. No mass or shadowing calcification. Mild hydronephrosis present. Visualized portion of the proximal RIGHT ureter appears dilated. Left Kidney: Length: 9.8 cm. Cortical thinning. Upper normal cortical echogenicity. No mass, hydronephrosis or shadowing calcification. Bladder: Appears normal for degree of bladder distention. LEFT ureteral jet was visualized. RIGHT ureteral jet was not seen during imaging. IMPRESSION: Mild RIGHT hydronephrosis and hydroureter with nonvisualization of a RIGHT ureteral jet at the urinary bladder. Mild BILATERAL renal cortical thinning. Electronically Signed   By: [name] M.D.   On: 09/30/2017 13:27    Impression/Assessment:  52 year old male with chronic kidney disease with new onset right hydronephrosis/hydroureter and significant rising creatinine.  No definite stone seen on CT.  Plan:  I have recommended cystoscopy with right retrograde pyelogram and placement of a right ureteral stent.  The procedure was discussed in detail including potential risks of bleeding, infection and ureteral injury.  We discussed the possibility of obstruction precluding stent placement and in that event he would require percutaneous nephrostomy.  He indicated all questions were answered and desires to proceed.  09/30/2017, 4:23 PM  [name],  MD   Thank you for involving me in this patient's care, I will continue to follow along.  Please page with any further questions or concerns.  [place]

## 2017-10-01 ENCOUNTER — Ambulatory Visit: Payer: Self-pay | Admitting: Urology

## 2017-10-01 ENCOUNTER — Encounter: Payer: Self-pay | Admitting: Urology

## 2017-10-01 DIAGNOSIS — N179 Acute kidney failure, unspecified: Secondary | ICD-10-CM | POA: Diagnosis not present

## 2017-10-01 DIAGNOSIS — N1330 Unspecified hydronephrosis: Secondary | ICD-10-CM | POA: Diagnosis not present

## 2017-10-01 DIAGNOSIS — Z720 Tobacco use: Secondary | ICD-10-CM | POA: Diagnosis not present

## 2017-10-01 DIAGNOSIS — I10 Essential (primary) hypertension: Secondary | ICD-10-CM | POA: Diagnosis not present

## 2017-10-01 LAB — COMPREHENSIVE METABOLIC PANEL
ALBUMIN: 2.9 g/dL — AB (ref 3.5–5.0)
ALK PHOS: 204 U/L — AB (ref 38–126)
ALT: 163 U/L — ABNORMAL HIGH (ref 17–63)
ANION GAP: 11 (ref 5–15)
AST: 49 U/L — ABNORMAL HIGH (ref 15–41)
BUN: 69 mg/dL — ABNORMAL HIGH (ref 6–20)
CALCIUM: 8.2 mg/dL — AB (ref 8.9–10.3)
CO2: 15 mmol/L — AB (ref 22–32)
Chloride: 108 mmol/L (ref 101–111)
Creatinine, Ser: 8.78 mg/dL — ABNORMAL HIGH (ref 0.61–1.24)
GFR calc Af Amer: 7 mL/min — ABNORMAL LOW (ref >60)
GFR calc non Af Amer: 6 mL/min — ABNORMAL LOW (ref >60)
GLUCOSE: 134 mg/dL — AB (ref 65–99)
Potassium: 4.9 mmol/L (ref 3.5–5.1)
SODIUM: 134 mmol/L — AB (ref 135–145)
Total Bilirubin: 0.5 mg/dL (ref 0.3–1.2)
Total Protein: 6.1 g/dL — ABNORMAL LOW (ref 6.5–8.1)

## 2017-10-01 LAB — CBC
HEMATOCRIT: 35.1 % — AB (ref 40.0–52.0)
HEMOGLOBIN: 12.1 g/dL — AB (ref 13.0–18.0)
MCH: 31.9 pg (ref 26.0–34.0)
MCHC: 34.3 g/dL (ref 32.0–36.0)
MCV: 92.8 fL (ref 80.0–100.0)
Platelets: 257 10*3/uL (ref 150–440)
RBC: 3.78 MIL/uL — AB (ref 4.40–5.90)
RDW: 13.1 % (ref 11.5–14.5)
WBC: 5.8 10*3/uL (ref 3.8–10.6)

## 2017-10-01 MED ORDER — FAMOTIDINE 20 MG PO TABS: 20.0000 mg | ORAL_TABLET | Freq: Two times a day (BID) | ORAL | Status: DC

## 2017-10-01 NOTE — Discharge Summary (Signed)
[facility] NAME: Larry [name]    MR#:  [id number]  DATE OF BIRTH:  1966-02-19  DATE OF ADMISSION:  09/30/2017 ADMITTING PHYSICIAN: Nicholes Mango, MD  DATE OF DISCHARGE: 10/01/2017  PRIMARY CARE PHYSICIAN: Karen Kitchens, MD    ADMISSION DIAGNOSIS:  Right lower quadrant abdominal pain [R10.31] Nausea and vomiting, intractability of vomiting not specified, unspecified vomiting type [R11.2] Acute renal failure superimposed on chronic kidney disease, unspecified CKD stage, unspecified acute renal failure type ([place]) [N17.9, N18.9]  DISCHARGE DIAGNOSIS:  Acute on chronic kidney disease stage V--- baseline creatinine around 5 Right flank pain with hydronephrosis status post right ureteral stent placement on 09/30/17  SECONDARY DIAGNOSIS:   Past Medical History:  Diagnosis Date  . Chronic kidney disease   . Hypertension     HOSPITAL COURSE:  Larry [name]  is a 52 y.o. male with a known history of  CKD, recent creatinine at 5.5 seen by Dr. Holley Raring as an outpatient and hypertension is presenting to the emergency department with right-sided flank pain.  Patient was diagnosed with possible kidney stone on January 5 and he was given pain medications sent home.  Patient is presenting again with sharp and severe right-sided flank pain for 5-6 hours associated with nausea and vomiting   #Acute right flank pain secondary to right-sided hydroureteronephrosis recieved IV fluids s/p cystoscopy with stent placement by dr Bernardo Heater on 09/30/17 Pain management as needed  #Acute kidney injury on chronic kidney disease-V Hydrated with IV fluids Follow-up with urology as an outpatient Consult placed to nephrology as patient is Dr. Holley Raring as an outpatient for chronic kidney disease Avoid nephrotoxins Creatinine at 10.2 --- 8.7 but potassium within normal range 4.9 Patient's baseline creatinine is around 5.5  #Essential  hypertension Continue home medication hydralazine and Toprol and titrate as needed  #Tobacco abuse disorder Counseled patient to quit smoking for 4-5 minutes.  Patient reports he is planning to quit and not considering nicotine patch at this time  DVT prophylaxis with SCDs  Patient is requesting to go home.  Discussed with nephrology okay to go home after seen by nephrology with outpatient close follow-up with labs with Dr. Holley Raring.   CONSULTS OBTAINED:  Treatment Team:  [name], MD Hollice Espy, MD [name], MD  DRUG ALLERGIES:   Allergies  Allergen Reactions  . Amoxicillin Nausea And Vomiting    DISCHARGE MEDICATIONS:   Allergies as of 10/01/2017      Reactions   Amoxicillin Nausea And Vomiting      Medication List    TAKE these medications   famotidine 20 MG tablet Commonly known as:  PEPCID Take 1 tablet (20 mg total) by mouth 2 (two) times daily.   hydrALAZINE 25 MG tablet Commonly known as:  APRESOLINE Take 25 mg by mouth daily.   metoprolol succinate 100 MG 24 hr tablet Commonly known as:  TOPROL-XL Take 100 mg by mouth daily.   ondansetron 4 MG disintegrating tablet Commonly known as:  ZOFRAN ODT Take 1 tablet (4 mg total) by mouth every 8 (eight) hours as needed for nausea or vomiting. What changed:  Another medication with the same name was removed. Continue taking this medication, and follow the directions you see here.   oxyCODONE-acetaminophen 5-325 MG tablet Commonly known as:  ROXICET Take 1 tablet by mouth every 6 (six) hours as needed for severe pain.   sertraline 25 MG tablet Commonly known as:  ZOLOFT Take  1 tablet by mouth daily.   sodium bicarbonate 650 MG tablet Take 1,300 mg by mouth 2 (two) times daily.   tamsulosin 0.4 MG Caps capsule Commonly known as:  FLOMAX Take 1 capsule by mouth daily.       If you experience worsening of your admission symptoms, develop shortness of breath, life threatening  emergency, suicidal or homicidal thoughts you must seek medical attention immediately by calling 911 or calling your MD immediately  if symptoms less severe.  You Must read complete instructions/literature along with all the possible adverse reactions/side effects for all the Medicines you take and that have been prescribed to you. Take any new Medicines after you have completely understood and accept all the possible adverse reactions/side effects.   Please note  You were cared for by a hospitalist during your hospital stay. If you have any questions about your discharge medications or the care you received while you were in the hospital after you are discharged, you can call the unit and asked to speak with the hospitalist on call if the hospitalist that took care of you is not available. Once you are discharged, your primary care physician will handle any further medical issues. Please note that NO REFILLS for any discharge medications will be authorized once you are discharged, as it is imperative that you return to your primary care physician (or establish a relationship with a primary care physician if you do not have one) for your aftercare needs so that they can reassess your need for medications and monitor your lab values. Today   SUBJECTIVE  Denies any symptoms.  Ate good breakfast.  Wife in the room   VITAL SIGNS:  Blood pressure (!) 146/86, pulse (!) 55, temperature (!) 97.4 F (36.3 C), temperature source Oral, resp. rate 20, height 5\' 6"  (1.676 m), weight 72.6 kg (160 lb), SpO2 98 %.  I/O:    Intake/Output Summary (Last 24 hours) at 10/01/2017 0923 Last data filed at 10/01/2017 7824 Gross per 24 hour  Intake 2517 ml  Output 1240 ml  Net 1277 ml    PHYSICAL EXAMINATION:  GENERAL:  51 y.o.-year-old patient lying in the bed with no acute distress.  EYES: Pupils equal, round, reactive to light and accommodation. No scleral icterus. Extraocular muscles intact.  HEENT: Head  atraumatic, normocephalic. Oropharynx and nasopharynx clear.  NECK:  Supple, no jugular venous distention. No thyroid enlargement, no tenderness.  LUNGS: Normal breath sounds bilaterally, no wheezing, rales,rhonchi or crepitation. No use of accessory muscles of respiration.  CARDIOVASCULAR: S1, S2 normal. No murmurs, rubs, or gallops.  ABDOMEN: Soft, non-tender, non-distended. Bowel sounds present. No organomegaly or mass.  EXTREMITIES: No pedal edema, cyanosis, or clubbing.  NEUROLOGIC: Cranial nerves II through XII are intact. Muscle strength 5/5 in all extremities. Sensation intact. Gait not checked.  PSYCHIATRIC: The patient is alert and oriented x 3.  SKIN: No obvious rash, lesion, or ulcer.   DATA REVIEW:   CBC  Recent Labs  Lab 10/01/17 0346  WBC 5.8  HGB 12.1*  HCT 35.1*  PLT 257    Chemistries  Recent Labs  Lab 10/01/17 0346  NA 134*  K 4.9  CL 108  CO2 15*  GLUCOSE 134*  BUN 69*  CREATININE 8.78*  CALCIUM 8.2*  AST 49*  ALT 163*  ALKPHOS 204*  BILITOT 0.5    Microbiology Results   No results found for this or any previous visit (from the past 240 hour(s)).  RADIOLOGY:  US Renal  Result Date: 09/30/2017 CLINICAL DATA:  Hydronephrosis on CT EXAM: RENAL / URINARY TRACT ULTRASOUND COMPLETE COMPARISON:  CT abdomen and pelvis 09/27/2017 FINDINGS: Right Kidney: Length: 11.1 cm. Minimal cortical thinning. Borderline increased cortical echogenicity. No mass or shadowing calcification. Mild hydronephrosis present. Visualized portion of the proximal RIGHT ureter appears dilated. Left Kidney: Length: 9.8 cm. Cortical thinning. Upper normal cortical echogenicity. No mass, hydronephrosis or shadowing calcification. Bladder: Appears normal for degree of bladder distention. LEFT ureteral jet was visualized. RIGHT ureteral jet was not seen during imaging. IMPRESSION: Mild RIGHT hydronephrosis and hydroureter with nonvisualization of a RIGHT ureteral jet at the urinary bladder.  Mild BILATERAL renal cortical thinning. Electronically Signed   By: [name] M.D.   On: 09/30/2017 13:27     Management plans discussed with the patient, family and they are in agreement.  CODE STATUS:     Code Status Orders  (From admission, onward)        Start     Ordered   09/30/17 1402  Full code  Continuous     09/30/17 1401    Code Status History    Date Active Date Inactive Code Status Order ID Comments User Context   This patient has a current code status but no historical code status.      TOTAL TIME TAKING CARE OF THIS PATIENT: 40 minutes.    Fritzi Mandes M.D on 10/01/2017 at 9:23 AM  Between 7am to 6pm - Pager - [phone] After 6pm go to [email or web address] - password [email or web address] [place]  Office  [phone]  CC: Primary care physician; Karen Kitchens, MD

## 2017-10-01 NOTE — Progress Notes (Signed)
PHARMACIST - PHYSICIAN COMMUNICATION  CONCERNING: IV to Oral Route Change Policy  RECOMMENDATION: This patient is receiving famotidine by the intravenous route.  Based on criteria approved by the Pharmacy and Therapeutics Committee, the intravenous medication(s) is/are being converted to the equivalent oral dose form(s).   DESCRIPTION: These criteria include:  The patient is eating (either orally or via tube) and/or has been taking other orally administered medications for a least 24 hours  The patient has no evidence of active gastrointestinal bleeding or impaired GI absorption (gastrectomy, short bowel, patient on TNA or NPO).  If you have questions about this conversion, please contact the Pharmacy Department  []   [phone] )  [facility] [x]   [phone] )  [facility] []   [phone] )  [facility] []   [phone] )  [facility] []   [phone] )  [place], [facility] 10/01/2017 8:25 AM

## 2017-10-02 DIAGNOSIS — N185 Chronic kidney disease, stage 5: Secondary | ICD-10-CM | POA: Diagnosis not present

## 2017-10-02 DIAGNOSIS — I10 Essential (primary) hypertension: Secondary | ICD-10-CM | POA: Diagnosis not present

## 2017-10-02 DIAGNOSIS — R809 Proteinuria, unspecified: Secondary | ICD-10-CM | POA: Diagnosis not present

## 2017-10-02 DIAGNOSIS — N022 Recurrent and persistent hematuria with diffuse membranous glomerulonephritis: Secondary | ICD-10-CM | POA: Diagnosis not present

## 2017-10-02 LAB — HIV ANTIBODY (ROUTINE TESTING W REFLEX): HIV Screen 4th Generation wRfx: NONREACTIVE

## 2017-10-02 NOTE — Anesthesia Postprocedure Evaluation (Signed)
Anesthesia Post Note  Patient: BRENDEN RUDMAN  Procedure(s) Performed: CYSTOSCOPY WITH STENT PLACEMENT (Right Ureter)  Patient location during evaluation: PACU Anesthesia Type: General Level of consciousness: awake and alert Pain management: pain level controlled Vital Signs Assessment: post-procedure vital signs reviewed and stable Respiratory status: spontaneous breathing, nonlabored ventilation, respiratory function stable and patient connected to nasal cannula oxygen Cardiovascular status: blood pressure returned to baseline and stable Postop Assessment: no apparent nausea or vomiting Anesthetic complications: no     Last Vitals:  Vitals:   10/01/17 0014 10/01/17 0432  BP: 137/74 (!) 146/86  Pulse: 61 (!) 55  Resp: 18 20  Temp: (!) 36.4 C (!) 36.3 C  SpO2: 98% 98%    Last Pain:  Vitals:   10/01/17 0432  TempSrc: Oral  PainSc:                  Martha Clan

## 2017-10-15 ENCOUNTER — Other Ambulatory Visit: Payer: Self-pay | Admitting: Radiology

## 2017-10-15 ENCOUNTER — Ambulatory Visit (INDEPENDENT_AMBULATORY_CARE_PROVIDER_SITE_OTHER): Payer: BLUE CROSS/BLUE SHIELD | Admitting: Urology

## 2017-10-15 ENCOUNTER — Encounter: Payer: Self-pay | Admitting: Urology

## 2017-10-15 VITALS — BP 134/68 | HR 76 | Ht 66.0 in | Wt 165.0 lb

## 2017-10-15 DIAGNOSIS — N1330 Unspecified hydronephrosis: Secondary | ICD-10-CM

## 2017-10-15 DIAGNOSIS — N200 Calculus of kidney: Secondary | ICD-10-CM | POA: Diagnosis not present

## 2017-10-15 LAB — URINALYSIS, COMPLETE
BILIRUBIN UA: NEGATIVE
KETONES UA: NEGATIVE
Nitrite, UA: NEGATIVE
SPEC GRAV UA: 1.025 (ref 1.005–1.030)
UUROB: 0.2 mg/dL (ref 0.2–1.0)
pH, UA: 7 (ref 5.0–7.5)

## 2017-10-15 LAB — MICROSCOPIC EXAMINATION: Epithelial Cells (non renal): NONE SEEN /hpf (ref 0–10)

## 2017-10-15 NOTE — [facility] Notes (Signed)
10/15/2017 3:38 PM   [name] [birth year] [id number]  Referring provider: Karen Kitchens, MD [address] [place], [place] [address]  Chief Complaint  Patient presents with  . Nephrolithiasis    follow up    HPI: 52 year old male seen for hospital follow-up.  He has chronic kidney disease and was admitted on 09/30/2017 for acute on chronic renal failure.  CT showed moderate right hydronephrosis with hydroureter to the UVJ.  There was no evidence of stone.  He underwent cystoscopy right retrograde pyelogram.  There was moderate right hydronephrosis and hydroureter.  No definite stone was seen.  There was some edema present at the right UVJ.  His pain resolved after stent placement.  He has mild stent irritative symptoms.   PMH: Past Medical History:  Diagnosis Date  . Chronic kidney disease   . Hypertension     Surgical History: Past Surgical History:  Procedure Laterality Date  . CHOLECYSTECTOMY    . CYSTOSCOPY WITH STENT PLACEMENT Right 09/30/2017   Procedure: CYSTOSCOPY WITH STENT PLACEMENT;  Surgeon: [name], MD;  Location: [facility] ORS;  Service: Urology;  Laterality: Right;  . RENAL BIOPSY      Home Medications:  Allergies as of 10/15/2017      Reactions   Amoxicillin Nausea And Vomiting      Medication List        Accurate as of 10/15/17  3:38 PM. Always use your most recent med list.          famotidine 20 MG tablet Commonly known as:  PEPCID Take 1 tablet (20 mg total) by mouth 2 (two) times daily.   hydrALAZINE 25 MG tablet Commonly known as:  APRESOLINE Take 25 mg by mouth daily.   metoprolol succinate 100 MG 24 hr tablet Commonly known as:  TOPROL-XL Take 100 mg by mouth daily.   sertraline 25 MG tablet Commonly known as:  ZOLOFT Take 1 tablet by mouth daily.   sodium bicarbonate 650 MG tablet Take 1,300 mg by mouth 2 (two) times daily.   tamsulosin 0.4 MG Caps capsule Commonly known as:  FLOMAX Take 1 capsule by mouth daily.         Allergies:  Allergies  Allergen Reactions  . Amoxicillin Nausea And Vomiting    Family History: Family History  Problem Relation Age of Onset  . Diabetes Mother   . Kidney disease Mother        mother on dialysis  . CAD Mother   . Diabetes Sister   . CAD Sister     Social History:  reports that he has been smoking cigarettes.  He has a 20.00 pack-year smoking history. he has never used smokeless tobacco. He reports that he drinks alcohol. He reports that he does not use drugs.  ROS: UROLOGY Frequent Urination?: Yes Hard to postpone urination?: No Burning/pain with urination?: No Get up at night to urinate?: Yes Leakage of urine?: No Urine stream starts and stops?: No Trouble starting stream?: No Do you have to strain to urinate?: No Blood in urine?: No Urinary tract infection?: No Sexually transmitted disease?: No Injury to kidneys or bladder?: No Painful intercourse?: No Weak stream?: No Erection problems?: No Penile pain?: No  Gastrointestinal Nausea?: No Vomiting?: No Indigestion/heartburn?: No Diarrhea?: No Constipation?: No  Constitutional Fever: No Night sweats?: No Weight loss?: No Fatigue?: No  Skin Skin rash/lesions?: No Itching?: No  Eyes Blurred vision?: No Double vision?: No  Ears/Nose/Throat Sore throat?: No Sinus problems?: No  Hematologic/Lymphatic Swollen glands?: No Easy bruising?: No  Cardiovascular Leg swelling?: No Chest pain?: No  Respiratory Cough?: No Shortness of breath?: No  Endocrine Excessive thirst?: No  Musculoskeletal Back pain?: No Joint pain?: No  Neurological Headaches?: No Dizziness?: No  Psychologic Depression?: No Anxiety?: No  Physical Exam: BP 134/68   Pulse 76   Ht 5\' 6"  (1.676 m)   Wt 165 lb (74.8 kg)   BMI 26.63 kg/m   Constitutional:  Alert and oriented, No acute distress. HEENT: [place] AT, moist mucus membranes.  Trachea midline, no masses. Cardiovascular: No clubbing,  cyanosis, or edema.   CV RRR Respiratory: Normal respiratory effort, no increased work of breathing.  Lungs clear GI: Abdomen is soft, nontender, nondistended, no abdominal masses GU: No CVA tenderness. Skin: No rashes, bruises or suspicious lesions. Lymph: No cervical or inguinal adenopathy. Neurologic: Grossly intact, no focal deficits, moving all 4 extremities. Psychiatric: Normal mood and affect.  Laboratory Data: Lab Results  Component Value Date   WBC 5.8 10/01/2017   HGB 12.1 (L) 10/01/2017   HCT 35.1 (L) 10/01/2017   MCV 92.8 10/01/2017   PLT 257 10/01/2017    Lab Results  Component Value Date   CREATININE 8.78 (H) 10/01/2017    Lab Results  Component Value Date   HGBA1C 5.2 08/09/2014     Pertinent Imaging:  Results for orders placed during the hospital encounter of 09/30/17  US Renal   Narrative CLINICAL DATA:  Hydronephrosis on CT  EXAM: RENAL / URINARY TRACT ULTRASOUND COMPLETE  COMPARISON:  CT abdomen and pelvis 09/27/2017  FINDINGS: Right Kidney:  Length: 11.1 cm. Minimal cortical thinning. Borderline increased cortical echogenicity. No mass or shadowing calcification. Mild hydronephrosis present. Visualized portion of the proximal RIGHT ureter appears dilated.  Left Kidney:  Length: 9.8 cm. Cortical thinning. Upper normal cortical echogenicity. No mass, hydronephrosis or shadowing calcification.  Bladder:  Appears normal for degree of bladder distention. LEFT ureteral jet was visualized. RIGHT ureteral jet was not seen during imaging.  IMPRESSION: Mild RIGHT hydronephrosis and hydroureter with nonvisualization of a RIGHT ureteral jet at the urinary bladder.  Mild BILATERAL renal cortical thinning.   Electronically Signed   By: [name] M.D.   On: 09/30/2017 13:27     Results for orders placed during the hospital encounter of 09/27/17  CT Renal Stone Study   Narrative CLINICAL DATA:  Right flank pain.  EXAM: CT ABDOMEN AND  PELVIS WITHOUT CONTRAST  TECHNIQUE: Multidetector CT imaging of the abdomen and pelvis was performed following the standard protocol without IV contrast.  COMPARISON:  CT 03/09/2017  FINDINGS: Lower chest: The lung bases are clear.  Hepatobiliary: No focal hepatic lesion allowing for lack contrast. Clips in the gallbladder fossa postcholecystectomy. No biliary dilatation.  Pancreas: No ductal dilatation or inflammation.  Spleen: Normal in size without focal abnormality. Small splenule inferiorly.  Adrenals/Urinary Tract: No adrenal nodule.  Moderate right hydroureteronephrosis with perinephric and periureteric edema. No stone or cause of obstruction. Mild left renal parenchymal thinning without hydronephrosis or stone. The left ureter is decompressed. Urinary bladder is partially distended without stone or wall thickening. No urethral stone visualized.  Stomach/Bowel: Small hiatal hernia and distal esophageal wall thickening. Stomach physiologically distended. No small bowel inflammation, wall thickening or obstruction. Normal appendix. Small to moderate colonic stool burden without colonic wall thickening or inflammation.  Vascular/Lymphatic: Aortic atherosclerosis without aneurysm. No enlarged abdominal or pelvic lymph nodes.  Reproductive: Prostate is unremarkable.  Other: Fat within both inguinal  canals. No free air or ascites. No intra-abdominal abscess.  Musculoskeletal: There are no acute or suspicious osseous abnormalities. 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  IMPRESSION: 1. Right hydroureteronephrosis and perinephric/periureteric stranding without stone or cause of obstruction. Findings may be due to recently passed stone or urinary tract infection. Cystoscopy could be considered based on clinical concern. 2. Mild distal esophageal wall thickening and tiny hiatal hernia, can be seen with reflux. 3.  Aortic Atherosclerosis  (ICD10-I70.0).   Electronically Signed   By: Jeb Levering M.D.   On: 09/27/2017 02:02     Assessment & Plan:    1.  Right hydronephrosis Etiology undetermined.  It is possible he may have had a tiny stone.  I recommended scheduling cystoscopy with stent removal with right retrograde pyelogram and possible ureteroscopy.  The procedure was discussed in detail including potential risks of bleeding, infection, ureteral stricture as well as anesthetic risk.  Dictated all questions were answered and desires to proceed.  - CULTURE, URINE COMPREHENSIVE - Urinalysis, Complete - Creatinine, serum   [name], MD  [facility] [address] [place], [place] [address] [phone]

## 2017-10-15 NOTE — H&P (View-Only) (Signed)
10/15/2017 3:38 PM   [name] May 10, 1966 [id number]  Referring provider: Karen Kitchens, MD [address] [name], [place] [address]  Chief Complaint  Patient presents with  . Nephrolithiasis    follow up    HPI: 52 year old male seen for hospital follow-up.  He has chronic kidney disease and was admitted on 09/30/2017 for acute on chronic renal failure.  CT showed moderate right hydronephrosis with hydroureter to the UVJ.  There was no evidence of stone.  He underwent cystoscopy right retrograde pyelogram.  There was moderate right hydronephrosis and hydroureter.  No definite stone was seen.  There was some edema present at the right UVJ.  His pain resolved after stent placement.  He has mild stent irritative symptoms.   PMH: Past Medical History:  Diagnosis Date  . Chronic kidney disease   . Hypertension     Surgical History: Past Surgical History:  Procedure Laterality Date  . CHOLECYSTECTOMY    . CYSTOSCOPY WITH STENT PLACEMENT Right 09/30/2017   Procedure: CYSTOSCOPY WITH STENT PLACEMENT;  Surgeon: [name], MD;  Location: [facility] ORS;  Service: Urology;  Laterality: Right;  . RENAL BIOPSY      Home Medications:  Allergies as of 10/15/2017      Reactions   Amoxicillin Nausea And Vomiting      Medication List        Accurate as of 10/15/17  3:38 PM. Always use your most recent med list.          famotidine 20 MG tablet Commonly known as:  PEPCID Take 1 tablet (20 mg total) by mouth 2 (two) times daily.   hydrALAZINE 25 MG tablet Commonly known as:  APRESOLINE Take 25 mg by mouth daily.   metoprolol succinate 100 MG 24 hr tablet Commonly known as:  TOPROL-XL Take 100 mg by mouth daily.   sertraline 25 MG tablet Commonly known as:  ZOLOFT Take 1 tablet by mouth daily.   sodium bicarbonate 650 MG tablet Take 1,300 mg by mouth 2 (two) times daily.   tamsulosin 0.4 MG Caps capsule Commonly known as:  FLOMAX Take 1 capsule by mouth daily.         Allergies:  Allergies  Allergen Reactions  . Amoxicillin Nausea And Vomiting    Family History: Family History  Problem Relation Age of Onset  . Diabetes Mother   . Kidney disease Mother        mother on dialysis  . CAD Mother   . Diabetes Sister   . CAD Sister     Social History:  reports that he has been smoking cigarettes.  He has a 20.00 pack-year smoking history. he has never used smokeless tobacco. He reports that he drinks alcohol. He reports that he does not use drugs.  ROS: UROLOGY Frequent Urination?: Yes Hard to postpone urination?: No Burning/pain with urination?: No Get up at night to urinate?: Yes Leakage of urine?: No Urine stream starts and stops?: No Trouble starting stream?: No Do you have to strain to urinate?: No Blood in urine?: No Urinary tract infection?: No Sexually transmitted disease?: No Injury to kidneys or bladder?: No Painful intercourse?: No Weak stream?: No Erection problems?: No Penile pain?: No  Gastrointestinal Nausea?: No Vomiting?: No Indigestion/heartburn?: No Diarrhea?: No Constipation?: No  Constitutional Fever: No Night sweats?: No Weight loss?: No Fatigue?: No  Skin Skin rash/lesions?: No Itching?: No  Eyes Blurred vision?: No Double vision?: No  Ears/Nose/Throat Sore throat?: No Sinus problems?: No  Hematologic/Lymphatic Swollen glands?: No Easy bruising?: No  Cardiovascular Leg swelling?: No Chest pain?: No  Respiratory Cough?: No Shortness of breath?: No  Endocrine Excessive thirst?: No  Musculoskeletal Back pain?: No Joint pain?: No  Neurological Headaches?: No Dizziness?: No  Psychologic Depression?: No Anxiety?: No  Physical Exam: BP 134/68   Pulse 76   Ht 5\' 6"  (1.676 m)   Wt 165 lb (74.8 kg)   BMI 26.63 kg/m   Constitutional:  Alert and oriented, No acute distress. HEENT: [place] AT, moist mucus membranes.  Trachea midline, no masses. Cardiovascular: No clubbing,  cyanosis, or edema.   CV RRR Respiratory: Normal respiratory effort, no increased work of breathing.  Lungs clear GI: Abdomen is soft, nontender, nondistended, no abdominal masses GU: No CVA tenderness. Skin: No rashes, bruises or suspicious lesions. Lymph: No cervical or inguinal adenopathy. Neurologic: Grossly intact, no focal deficits, moving all 4 extremities. Psychiatric: Normal mood and affect.  Laboratory Data: Lab Results  Component Value Date   WBC 5.8 10/01/2017   HGB 12.1 (L) 10/01/2017   HCT 35.1 (L) 10/01/2017   MCV 92.8 10/01/2017   PLT 257 10/01/2017    Lab Results  Component Value Date   CREATININE 8.78 (H) 10/01/2017    Lab Results  Component Value Date   HGBA1C 5.2 08/09/2014     Pertinent Imaging:  Results for orders placed during the hospital encounter of 09/30/17  US Renal   Narrative CLINICAL DATA:  Hydronephrosis on CT  EXAM: RENAL / URINARY TRACT ULTRASOUND COMPLETE  COMPARISON:  CT abdomen and pelvis 09/27/2017  FINDINGS: Right Kidney:  Length: 11.1 cm. Minimal cortical thinning. Borderline increased cortical echogenicity. No mass or shadowing calcification. Mild hydronephrosis present. Visualized portion of the proximal RIGHT ureter appears dilated.  Left Kidney:  Length: 9.8 cm. Cortical thinning. Upper normal cortical echogenicity. No mass, hydronephrosis or shadowing calcification.  Bladder:  Appears normal for degree of bladder distention. LEFT ureteral jet was visualized. RIGHT ureteral jet was not seen during imaging.  IMPRESSION: Mild RIGHT hydronephrosis and hydroureter with nonvisualization of a RIGHT ureteral jet at the urinary bladder.  Mild BILATERAL renal cortical thinning.   Electronically Signed   By: [name] M.D.   On: 09/30/2017 13:27     Results for orders placed during the hospital encounter of 09/27/17  CT Renal Stone Study   Narrative CLINICAL DATA:  Right flank pain.  EXAM: CT ABDOMEN AND  PELVIS WITHOUT CONTRAST  TECHNIQUE: Multidetector CT imaging of the abdomen and pelvis was performed following the standard protocol without IV contrast.  COMPARISON:  CT 03/09/2017  FINDINGS: Lower chest: The lung bases are clear.  Hepatobiliary: No focal hepatic lesion allowing for lack contrast. Clips in the gallbladder fossa postcholecystectomy. No biliary dilatation.  Pancreas: No ductal dilatation or inflammation.  Spleen: Normal in size without focal abnormality. Small splenule inferiorly.  Adrenals/Urinary Tract: No adrenal nodule.  Moderate right hydroureteronephrosis with perinephric and periureteric edema. No stone or cause of obstruction. Mild left renal parenchymal thinning without hydronephrosis or stone. The left ureter is decompressed. Urinary bladder is partially distended without stone or wall thickening. No urethral stone visualized.  Stomach/Bowel: Small hiatal hernia and distal esophageal wall thickening. Stomach physiologically distended. No small bowel inflammation, wall thickening or obstruction. Normal appendix. Small to moderate colonic stool burden without colonic wall thickening or inflammation.  Vascular/Lymphatic: Aortic atherosclerosis without aneurysm. No enlarged abdominal or pelvic lymph nodes.  Reproductive: Prostate is unremarkable.  Other: Fat within both inguinal  canals. No free air or ascites. No intra-abdominal abscess.  Musculoskeletal: There are no acute or suspicious osseous abnormalities. 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  IMPRESSION: 1. Right hydroureteronephrosis and perinephric/periureteric stranding without stone or cause of obstruction. Findings may be due to recently passed stone or urinary tract infection. Cystoscopy could be considered based on clinical concern. 2. Mild distal esophageal wall thickening and tiny hiatal hernia, can be seen with reflux. 3.  Aortic Atherosclerosis  (ICD10-I70.0).   Electronically Signed   By: Jeb Levering M.D.   On: 09/27/2017 02:02     Assessment & Plan:    1.  Right hydronephrosis Etiology undetermined.  It is possible he may have had a tiny stone.  I recommended scheduling cystoscopy with stent removal with right retrograde pyelogram and possible ureteroscopy.  The procedure was discussed in detail including potential risks of bleeding, infection, ureteral stricture as well as anesthetic risk.  Dictated all questions were answered and desires to proceed.  - CULTURE, URINE COMPREHENSIVE - Urinalysis, Complete - Creatinine, serum   [name], MD  [facility] [address], [place], [place] [address] [phone]

## 2017-10-19 LAB — CULTURE, URINE COMPREHENSIVE

## 2017-10-21 ENCOUNTER — Other Ambulatory Visit: Payer: Self-pay

## 2017-10-21 ENCOUNTER — Encounter
Admission: RE | Admit: 2017-10-21 | Discharge: 2017-10-21 | Disposition: A | Discharge status: Home / Self Care | Payer: BLUE CROSS/BLUE SHIELD | Source: Ambulatory Visit | Attending: Urology | Admitting: Urology

## 2017-10-21 ENCOUNTER — Other Ambulatory Visit: Payer: Self-pay | Admitting: Radiology

## 2017-10-21 DIAGNOSIS — I700 Atherosclerosis of aorta: Secondary | ICD-10-CM | POA: Diagnosis not present

## 2017-10-21 DIAGNOSIS — Z79899 Other long term (current) drug therapy: Secondary | ICD-10-CM | POA: Diagnosis not present

## 2017-10-21 DIAGNOSIS — N189 Chronic kidney disease, unspecified: Secondary | ICD-10-CM | POA: Diagnosis not present

## 2017-10-21 DIAGNOSIS — N136 Pyonephrosis: Secondary | ICD-10-CM | POA: Diagnosis not present

## 2017-10-21 DIAGNOSIS — Z880 Allergy status to penicillin: Secondary | ICD-10-CM | POA: Diagnosis not present

## 2017-10-21 DIAGNOSIS — N179 Acute kidney failure, unspecified: Secondary | ICD-10-CM | POA: Diagnosis not present

## 2017-10-21 DIAGNOSIS — F17210 Nicotine dependence, cigarettes, uncomplicated: Secondary | ICD-10-CM | POA: Diagnosis not present

## 2017-10-21 DIAGNOSIS — I129 Hypertensive chronic kidney disease with stage 1 through stage 4 chronic kidney disease, or unspecified chronic kidney disease: Secondary | ICD-10-CM | POA: Diagnosis not present

## 2017-10-21 HISTORY — DX: Nephrotic syndrome with unspecified morphologic changes: N04.9

## 2017-10-21 LAB — BASIC METABOLIC PANEL WITH GFR
Anion gap: 10 (ref 5–15)
BUN: 53 mg/dL — ABNORMAL HIGH (ref 6–20)
CO2: 18 mmol/L — ABNORMAL LOW (ref 22–32)
Calcium: 8.7 mg/dL — ABNORMAL LOW (ref 8.9–10.3)
Chloride: 109 mmol/L (ref 101–111)
Creatinine, Ser: 4.48 mg/dL — ABNORMAL HIGH (ref 0.61–1.24)
GFR calc Af Amer: 16 mL/min — ABNORMAL LOW
GFR calc non Af Amer: 14 mL/min — ABNORMAL LOW
Glucose, Bld: 82 mg/dL (ref 65–99)
Potassium: 4.7 mmol/L (ref 3.5–5.1)
Sodium: 137 mmol/L (ref 135–145)

## 2017-10-21 NOTE — Pre-Procedure Instructions (Signed)
EKG Care Everywhere 06/15/17.

## 2017-10-21 NOTE — Patient Instructions (Addendum)
Your procedure is scheduled on: Friday 10/23/17 Report to [facility]. To find out your arrival time please call [phone] between 1PM - 3PM on Thursday 10/22/17.  Remember: Instructions that are not followed completely may result in serious medical risk, up to and including death, or upon the discretion of your surgeon and anesthesiologist your surgery may need to be rescheduled.     _X__ 1. Do not eat food after midnight the night before your procedure.                 No gum chewing or hard candies. You may drink clear liquids up to 2 hours                 before you are scheduled to arrive for your surgery- DO not drink clear                 liquids within 2 hours of the start of your surgery.                 Clear Liquids include:  water, apple juice without pulp, clear carbohydrate                 drink such as Clearfast of [place]art[place]ade, Black Coffee or Tea (Do not add                 anything to coffee or tea).  __X__2.  On the morning of surgery brush your teeth with toothpaste and water, you                 may rinse your mouth with mouthwash if you wish.  Do not swallow any              toothpaste of mouthwash.     _X__ 3.  No Alcohol for 24 hours before or after surgery.   _X__ 4.  Do Not Smoke or use e-cigarettes For 24 Hours Prior to Your Surgery.                 Do not use any chewable tobacco products for at least 6 hours prior to                 surgery.  ____  5.  Bring all medications with you on the day of surgery if instructed.   ____  6.  Notify your doctor if there is any change in your medical condition      (cold, fever, infections).     Do not wear jewelry, make-up, hairpins, clips or nail polish. Do not wear lotions, powders, or perfumes. You may wear deodorant. Do not shave 48 hours prior to surgery. Men may shave face and neck. Do not bring valuables to the hospital.    [facility] is not responsible for  any belongings or valuables.  Contacts, dentures or bridgework may not be worn into surgery. Leave your suitcase in the car. After surgery it may be brought to your room. For patients admitted to the hospital, discharge time is determined by your treatment team.   Patients discharged the day of surgery will not be allowed to drive home.   Please read over the following fact sheets that you were given:   MRSA Information   __X__ Take these medicines the morning of surgery with A SIP OF WATER:    1. HYDRALAZINE  2. METOPROLOL  3. SERTRALINE  4. FAMOTIDINE AT BEDTIME  AND IN AM   5.  6.  ____ Fleet Enema (as directed)   ____ Use CHG Soap as directed  ____ Use inhalers on the day of surgery  ____ Stop metformin/Janumet/Farxiga 2 days prior to surgery    ____ Take 1/2 of usual insulin dose the night before surgery. No insulin the morning          of surgery.   ____ Stop Blood Thinners Coumadin/Plavix/Xarelto/Pleta/Pradaxa/Eliquis/Effient/Aspirin  on   __X__ Stop Anti-inflammatories such as Advil, Ibuprofen, Motrin, BC or Goodies  Powder, Naprosyn, Naproxen, Aleve    __X__ Stop herbal supplements, fish oil or vitamin E until after surgery.    ____ Bring C-Pap to the hospital.

## 2017-10-22 MED ORDER — CEFAZOLIN SODIUM-DEXTROSE 2-4 GM/100ML-% IV SOLN
2.0000 g | INTRAVENOUS | Status: AC
  Administered 2017-10-23: 2 g via INTRAVENOUS

## 2017-10-22 NOTE — Pre-Procedure Instructions (Signed)
LABS FAXED TO DR STOIOFF

## 2017-10-23 ENCOUNTER — Ambulatory Visit: Payer: BLUE CROSS/BLUE SHIELD | Admitting: Anesthesiology

## 2017-10-23 ENCOUNTER — Ambulatory Visit
Admission: RE | Admit: 2017-10-23 | Discharge: 2017-10-23 | Disposition: A | Discharge status: Home / Self Care | Payer: BLUE CROSS/BLUE SHIELD | Source: Ambulatory Visit | Attending: Urology | Admitting: Urology

## 2017-10-23 ENCOUNTER — Other Ambulatory Visit: Payer: Self-pay

## 2017-10-23 ENCOUNTER — Encounter
Admission: RE | Disposition: A | Discharge status: Home / Self Care | Payer: Self-pay | Source: Ambulatory Visit | Attending: Urology

## 2017-10-23 ENCOUNTER — Encounter: Payer: Self-pay | Admitting: Anesthesiology

## 2017-10-23 DIAGNOSIS — Z880 Allergy status to penicillin: Secondary | ICD-10-CM | POA: Insufficient documentation

## 2017-10-23 DIAGNOSIS — I129 Hypertensive chronic kidney disease with stage 1 through stage 4 chronic kidney disease, or unspecified chronic kidney disease: Secondary | ICD-10-CM | POA: Diagnosis not present

## 2017-10-23 DIAGNOSIS — F17210 Nicotine dependence, cigarettes, uncomplicated: Secondary | ICD-10-CM | POA: Insufficient documentation

## 2017-10-23 DIAGNOSIS — I700 Atherosclerosis of aorta: Secondary | ICD-10-CM | POA: Insufficient documentation

## 2017-10-23 DIAGNOSIS — N1330 Unspecified hydronephrosis: Secondary | ICD-10-CM | POA: Diagnosis not present

## 2017-10-23 DIAGNOSIS — N179 Acute kidney failure, unspecified: Secondary | ICD-10-CM | POA: Diagnosis not present

## 2017-10-23 DIAGNOSIS — Z79899 Other long term (current) drug therapy: Secondary | ICD-10-CM | POA: Insufficient documentation

## 2017-10-23 DIAGNOSIS — N189 Chronic kidney disease, unspecified: Secondary | ICD-10-CM | POA: Insufficient documentation

## 2017-10-23 DIAGNOSIS — N136 Pyonephrosis: Secondary | ICD-10-CM | POA: Insufficient documentation

## 2017-10-23 DIAGNOSIS — I10 Essential (primary) hypertension: Secondary | ICD-10-CM | POA: Diagnosis not present

## 2017-10-23 HISTORY — PX: CYSTOSCOPY W/ URETERAL STENT REMOVAL: SHX1430

## 2017-10-23 HISTORY — PX: CYSTOSCOPY/RETROGRADE/URETEROSCOPY: SHX5316

## 2017-10-23 SURGERY — CYSTOSCOPY/RETROGRADE/URETEROSCOPY
Anesthesia: General | Laterality: Right | Wound class: Clean Contaminated

## 2017-10-23 MED ORDER — FENTANYL CITRATE (PF) 100 MCG/2ML IJ SOLN
INTRAMUSCULAR | Status: AC
  Filled 2017-10-23: qty 2

## 2017-10-23 MED ORDER — CEFAZOLIN SODIUM-DEXTROSE 2-4 GM/100ML-% IV SOLN
INTRAVENOUS | Status: AC
  Filled 2017-10-23: qty 100

## 2017-10-23 MED ORDER — PHENYLEPHRINE HCL 10 MG/ML IJ SOLN
INTRAMUSCULAR | Status: DC | PRN
  Administered 2017-10-23 (×3): 100 ug via INTRAVENOUS

## 2017-10-23 MED ORDER — FENTANYL CITRATE (PF) 100 MCG/2ML IJ SOLN
INTRAMUSCULAR | Status: DC | PRN
  Administered 2017-10-23 (×2): 50 ug via INTRAVENOUS

## 2017-10-23 MED ORDER — SODIUM CHLORIDE 0.9 % IV SOLN
INTRAVENOUS | Status: DC
  Administered 2017-10-23: 09:00:00 via INTRAVENOUS

## 2017-10-23 MED ORDER — LIDOCAINE HCL (CARDIAC) 20 MG/ML IV SOLN
INTRAVENOUS | Status: DC | PRN
  Administered 2017-10-23: 100 mg via INTRAVENOUS

## 2017-10-23 MED ORDER — PROPOFOL 10 MG/ML IV BOLUS
INTRAVENOUS | Status: DC | PRN
  Administered 2017-10-23: 170 mg via INTRAVENOUS

## 2017-10-23 MED ORDER — PROPOFOL 10 MG/ML IV BOLUS
INTRAVENOUS | Status: AC
  Filled 2017-10-23: qty 20

## 2017-10-23 MED ORDER — OXYCODONE-ACETAMINOPHEN 5-325 MG PO TABS: 1.0000 | ORAL_TABLET | Freq: Four times a day (QID) | ORAL | 0 refills | Status: DC | PRN

## 2017-10-23 MED ORDER — ONDANSETRON HCL 4 MG/2ML IJ SOLN
INTRAMUSCULAR | Status: AC
  Filled 2017-10-23: qty 2

## 2017-10-23 MED ORDER — MIDAZOLAM HCL 2 MG/2ML IJ SOLN
INTRAMUSCULAR | Status: DC | PRN
  Administered 2017-10-23: 2 mg via INTRAVENOUS

## 2017-10-23 MED ORDER — ONDANSETRON HCL 4 MG/2ML IJ SOLN: 4.0000 mg | Freq: Once | INTRAMUSCULAR | Status: DC | PRN

## 2017-10-23 MED ORDER — MIDAZOLAM HCL 2 MG/2ML IJ SOLN
INTRAMUSCULAR | Status: AC
  Filled 2017-10-23: qty 2

## 2017-10-23 MED ORDER — EPHEDRINE SULFATE 50 MG/ML IJ SOLN
INTRAMUSCULAR | Status: DC | PRN
  Administered 2017-10-23: 10 mg via INTRAVENOUS
  Administered 2017-10-23: 5 mg via INTRAVENOUS

## 2017-10-23 MED ORDER — IOTHALAMATE MEGLUMINE 17.2 % UR SOLN
URETHRAL | Status: DC | PRN
Start: 2017-10-23 — End: 2017-10-23
  Administered 2017-10-23: 16 mL via URETHRAL

## 2017-10-23 MED ORDER — FENTANYL CITRATE (PF) 100 MCG/2ML IJ SOLN: 25.0000 ug | INTRAMUSCULAR | Status: DC | PRN

## 2017-10-23 MED ORDER — LIDOCAINE HCL (PF) 2 % IJ SOLN
INTRAMUSCULAR | Status: AC
  Filled 2017-10-23: qty 10

## 2017-10-23 MED ORDER — DEXAMETHASONE SODIUM PHOSPHATE 10 MG/ML IJ SOLN
INTRAMUSCULAR | Status: AC
  Filled 2017-10-23: qty 1

## 2017-10-23 SURGICAL SUPPLY — 16 items
BAG DRAIN CYSTO-URO LG1000N (MISCELLANEOUS) ×2 IMPLANT
GLOVE BIO SURGEON STRL SZ8 (GLOVE) ×2 IMPLANT
GOWN STRL REUS W/ TWL LRG LVL3 (GOWN DISPOSABLE) ×2 IMPLANT
GOWN STRL REUS W/ TWL XL LVL3 (GOWN DISPOSABLE) ×1 IMPLANT
GOWN STRL REUS W/TWL LRG LVL3 (GOWN DISPOSABLE) ×4
GOWN STRL REUS W/TWL XL LVL3 (GOWN DISPOSABLE) ×2
PACK CYSTO AR (MISCELLANEOUS) ×2 IMPLANT
SENSORWIRE 0.038 NOT ANGLED (WIRE) ×2
SET CYSTO W/LG BORE CLAMP LF (SET/KITS/TRAYS/PACK) ×2 IMPLANT
SOL .9 NS 3000ML IRR  AL (IV SOLUTION) ×1
SOL .9 NS 3000ML IRR AL (IV SOLUTION) ×1
SOL .9 NS 3000ML IRR UROMATIC (IV SOLUTION) ×1 IMPLANT
STENT URET 6FRX24 CONTOUR (STENTS) ×1 IMPLANT
SURGILUBE 2OZ TUBE FLIPTOP (MISCELLANEOUS) ×2 IMPLANT
WATER STERILE IRR 1000ML POUR (IV SOLUTION) ×2 IMPLANT
WIRE SENSOR 0.038 NOT ANGLED (WIRE) ×1 IMPLANT

## 2017-10-23 NOTE — Interval H&P Note (Signed)
History and Physical Interval Note:  10/23/2017 9:17 AM  [name]  has presented today for surgery, with the diagnosis of Right hydronephrosis  The various methods of treatment have been discussed with the patient and family. After consideration of risks, benefits and other options for treatment, the patient has consented to  Procedure(s): CYSTOSCOPY/RETROGRADE/URETEROSCOPY (Right) CYSTOSCOPY WITH STENT REMOVAL (Right) as a surgical intervention .  The patient's history has been reviewed, patient examined, no change in status, stable for surgery.  I have reviewed the patient's chart and labs.  Questions were answered to the patient's satisfaction.   CV RRR Lungs clear   Ronda Fairly Avonna Iribe

## 2017-10-23 NOTE — Op Note (Signed)
Preoperative diagnosis: Right distal ureteral obstruction of undetermined etiology  Postoperative diagnosis: Right distal ureteral obstruction of undetermined etiology  Procedure:  1. Cystoscopy with ureteral stent removal 2. Right diagnostic ureteroscopy 3. Right ureteral stent placement 4. Right retrograde pyelography with interpretation   Surgeon: Nicki Reaper C. Zaryah Seckel, M.D.  Anesthesia: General  Complications: None  Intraoperative findings:  1.  Right retrograde pyelography demonstrated mild right hydronephrosis.  The ureter was normal in caliber however contrast was not draining on real-time fluoroscopy  EBL: Minimal  Specimens: 1. None   Indication: Larry [name] is a 52 y.o. year old patient who developed right flank pain in early January 2019.  He has chronic kidney disease with a baseline creatinine of 4.8.  A CT scan was remarkable for right hydronephrosis with hydroureter to the UVJ.  His pain persisted and a follow-up creatinine was 10.22.  He underwent placement of a right ureteral stent on 09/30/2017 with return of his creatinine to baseline.  He presents for ureteroscopy, stent removal versus stent exchange.  After reviewing the management options for treatment, the patient elected to proceed with the above surgical procedure(s). We have discussed the potential benefits and risks of the procedure, side effects of the proposed treatment, the likelihood of the patient achieving the goals of the procedure, and any potential problems that might occur during the procedure or recuperation. Informed consent has been obtained.  Description of procedure:  The patient was taken to the operating room and general anesthesia was induced.  The patient was placed in the dorsal lithotomy position, prepped and draped in the usual sterile fashion, and preoperative antibiotics were administered. A preoperative time-out was performed.   A 22 French cystoscope was lubricated and passed under  direct vision.  The urethra was normal in caliber without stricture.  The prostate demonstrated mild lateral lobe enlargement with mild bladder neck elevation.  Panendoscopy was performed and the bladder mucosa showed no solid or papillary lesions.  There was mild edema of the right hemitrigone secondary to the indwelling stent.  The stent was grasped with endoscopic forceps and brought out to the urethral meatus.  A 0.038 Sensor wire was then placed through the stent and advanced in to the renal pelvis under fluoroscopy.  The stent was removed.  A 4.5 Fr semirigid ureteroscope was then advanced into the ureter next to the guidewire.  There was no evidence of stone or stricture in the distal ureter.  The ureteroscope was advanced to the upper proximal ureter and no abnormalities were noted.  The ureter was open with the exception of the distal 1-2 cm and the mucosa collapsed.  The guidewire was removed and a pullout retrograde pyelogram was performed through the ureteroscope with findings as described above.  With the ureteroscope positioned at the right UO no efflux of contrast was seen and there was persistent hang of contrast on real-time fluoroscopy.  Based on his chronic kidney disease it was elected to replace his ureteral stent.  The guidewire was replaced and the ureteroscope was removed.  The guidewire was backloaded on the cystoscope and a 6 French/24 cm double-J ureteral stent was placed without difficulty.  Good proximal/distal positioning was noted on fluoroscopy and under direct vision.  The bladder was then emptied and the procedure ended.  The patient appeared to tolerate the procedure well and without complications.  After anesthetic reversal the patient was transported to the PACU in stable condition.

## 2017-10-23 NOTE — Transfer of Care (Signed)
Immediate Anesthesia Transfer of Care Note  Patient: Larry [name]  Procedure(s) Performed: CYSTOSCOPY/RETROGRADE/URETEROSCOPY (Right ) CYSTOSCOPY WITH STENT REPLACEMENT (Right )  Patient Location: PACU  Anesthesia Type:General  Level of Consciousness: awake  Airway & Oxygen Therapy: Patient connected to face mask oxygen  Post-op Assessment: Post -op Vital signs reviewed and stable  Post vital signs: stable  Last Vitals:  Vitals:   10/23/17 0903 10/23/17 1012  BP: (!) 161/91 (!) 126/91  Pulse: 68   Resp: 18   Temp: (!) 36.2 C (!) 36 C  SpO2: 100%     Last Pain:  Vitals:   10/23/17 0903  TempSrc: Tympanic         Complications: No apparent anesthesia complications

## 2017-10-23 NOTE — Anesthesia Postprocedure Evaluation (Signed)
Anesthesia Post Note  Patient: Larry [name]  Procedure(s) Performed: CYSTOSCOPY/RETROGRADE/URETEROSCOPY (Right ) CYSTOSCOPY WITH STENT REPLACEMENT (Right )  Patient location during evaluation: PACU Anesthesia Type: General Level of consciousness: awake and alert Pain management: pain level controlled Vital Signs Assessment: post-procedure vital signs reviewed and stable Respiratory status: spontaneous breathing, nonlabored ventilation, respiratory function stable and patient connected to nasal cannula oxygen Cardiovascular status: blood pressure returned to baseline and stable Postop Assessment: no apparent nausea or vomiting Anesthetic complications: no     Last Vitals:  Vitals:   10/23/17 1043 10/23/17 1053  BP: (!) 128/97 (!) 147/94  Pulse: 80 76  Resp: (!) 27 18  Temp: (!) 36.4 C (!) 36.3 C  SpO2: 100% 100%    Last Pain:  Vitals:   10/23/17 1053  TempSrc: Oral  PainSc: [place]

## 2017-10-23 NOTE — Anesthesia Preprocedure Evaluation (Signed)
Anesthesia Evaluation  Patient identified by MRN, date of birth, ID band Patient awake    Reviewed: Allergy & Precautions, NPO status , Patient's Chart, lab work & pertinent test results, reviewed documented beta blocker date and time   Airway Mallampati: II  TM Distance: >3 FB     Dental  (+) Chipped   Pulmonary Current Smoker,           Cardiovascular hypertension, Pt. on medications and Pt. on home beta blockers      Neuro/Psych    GI/Hepatic   Endo/Other    Renal/GU Renal disease     Musculoskeletal   Abdominal   Peds  Hematology   Anesthesia Other Findings   Reproductive/Obstetrics                             Anesthesia Physical Anesthesia Plan  ASA: III  Anesthesia Plan: General   Post-op Pain Management:    Induction: Intravenous  PONV Risk Score and Plan:   Airway Management Planned: LMA  Additional Equipment:   Intra-op Plan:   Post-operative Plan:   Informed Consent: I have reviewed the patients History and Physical, chart, labs and discussed the procedure including the risks, benefits and alternatives for the proposed anesthesia with the patient or authorized representative who has indicated his/her understanding and acceptance.     Plan Discussed with: CRNA  Anesthesia Plan Comments:         Anesthesia Quick Evaluation

## 2017-10-23 NOTE — Anesthesia Procedure Notes (Signed)
Procedure Name: LMA Insertion Date/Time: 10/23/2017 9:36 AM Performed by: Aline Brochure, CRNA Pre-anesthesia Checklist: Patient identified, Emergency Drugs available, Suction available and Patient being monitored Patient Re-evaluated:Patient Re-evaluated prior to induction Oxygen Delivery Method: Circle system utilized Preoxygenation: Pre-oxygenation with 100% oxygen Induction Type: IV induction Ventilation: Mask ventilation without difficulty LMA: LMA inserted Tube size: 4.5 mm Number of attempts: 1 Placement Confirmation: positive ETCO2 and breath sounds checked- equal and bilateral Tube secured with: Tape Dental Injury: Teeth and Oropharynx as per pre-operative assessment

## 2017-10-23 NOTE — Anesthesia Post-op Follow-up Note (Signed)
Anesthesia QCDR form completed.        

## 2017-10-30 DIAGNOSIS — N186 End stage renal disease: Secondary | ICD-10-CM | POA: Diagnosis not present

## 2017-10-30 DIAGNOSIS — I708 Atherosclerosis of other arteries: Secondary | ICD-10-CM | POA: Diagnosis not present

## 2017-10-30 DIAGNOSIS — K4090 Unilateral inguinal hernia, without obstruction or gangrene, not specified as recurrent: Secondary | ICD-10-CM | POA: Diagnosis not present

## 2017-10-30 DIAGNOSIS — Z01810 Encounter for preprocedural cardiovascular examination: Secondary | ICD-10-CM | POA: Diagnosis not present

## 2017-10-30 DIAGNOSIS — Z01818 Encounter for other preprocedural examination: Secondary | ICD-10-CM | POA: Diagnosis not present

## 2017-10-30 DIAGNOSIS — Z960 Presence of urogenital implants: Secondary | ICD-10-CM | POA: Diagnosis not present

## 2017-10-30 DIAGNOSIS — J984 Other disorders of lung: Secondary | ICD-10-CM | POA: Diagnosis not present

## 2017-10-30 DIAGNOSIS — I2510 Atherosclerotic heart disease of native coronary artery without angina pectoris: Secondary | ICD-10-CM | POA: Diagnosis not present

## 2017-10-30 DIAGNOSIS — I517 Cardiomegaly: Secondary | ICD-10-CM | POA: Diagnosis not present

## 2017-10-30 DIAGNOSIS — Z87891 Personal history of nicotine dependence: Secondary | ICD-10-CM | POA: Diagnosis not present

## 2017-10-30 DIAGNOSIS — Z79899 Other long term (current) drug therapy: Secondary | ICD-10-CM | POA: Diagnosis not present

## 2017-10-30 DIAGNOSIS — I129 Hypertensive chronic kidney disease with stage 1 through stage 4 chronic kidney disease, or unspecified chronic kidney disease: Secondary | ICD-10-CM | POA: Diagnosis not present

## 2017-11-18 DIAGNOSIS — E872 Acidosis: Secondary | ICD-10-CM | POA: Diagnosis not present

## 2017-11-18 DIAGNOSIS — D631 Anemia in chronic kidney disease: Secondary | ICD-10-CM | POA: Diagnosis not present

## 2017-11-18 DIAGNOSIS — I10 Essential (primary) hypertension: Secondary | ICD-10-CM | POA: Diagnosis not present

## 2017-11-18 DIAGNOSIS — N2581 Secondary hyperparathyroidism of renal origin: Secondary | ICD-10-CM | POA: Diagnosis not present

## 2017-11-18 DIAGNOSIS — N185 Chronic kidney disease, stage 5: Secondary | ICD-10-CM | POA: Diagnosis not present

## 2017-11-19 DIAGNOSIS — Z87891 Personal history of nicotine dependence: Secondary | ICD-10-CM | POA: Diagnosis not present

## 2017-11-19 DIAGNOSIS — I120 Hypertensive chronic kidney disease with stage 5 chronic kidney disease or end stage renal disease: Secondary | ICD-10-CM | POA: Diagnosis not present

## 2017-11-19 DIAGNOSIS — Z9221 Personal history of antineoplastic chemotherapy: Secondary | ICD-10-CM | POA: Diagnosis not present

## 2017-11-19 DIAGNOSIS — N185 Chronic kidney disease, stage 5: Secondary | ICD-10-CM | POA: Diagnosis not present

## 2017-11-19 DIAGNOSIS — Z01810 Encounter for preprocedural cardiovascular examination: Secondary | ICD-10-CM | POA: Diagnosis not present

## 2017-11-19 DIAGNOSIS — Z01818 Encounter for other preprocedural examination: Secondary | ICD-10-CM | POA: Diagnosis not present

## 2017-11-19 DIAGNOSIS — R200 Anesthesia of skin: Secondary | ICD-10-CM | POA: Diagnosis not present

## 2017-11-19 DIAGNOSIS — J984 Other disorders of lung: Secondary | ICD-10-CM | POA: Diagnosis not present

## 2017-11-19 DIAGNOSIS — Z6826 Body mass index (BMI) 26.0-26.9, adult: Secondary | ICD-10-CM | POA: Diagnosis not present

## 2017-11-19 DIAGNOSIS — Z880 Allergy status to penicillin: Secondary | ICD-10-CM | POA: Diagnosis not present

## 2017-11-19 DIAGNOSIS — N186 End stage renal disease: Secondary | ICD-10-CM | POA: Diagnosis not present

## 2017-12-23 DIAGNOSIS — D631 Anemia in chronic kidney disease: Secondary | ICD-10-CM | POA: Diagnosis not present

## 2017-12-23 DIAGNOSIS — N2581 Secondary hyperparathyroidism of renal origin: Secondary | ICD-10-CM | POA: Diagnosis not present

## 2017-12-23 DIAGNOSIS — N185 Chronic kidney disease, stage 5: Secondary | ICD-10-CM | POA: Diagnosis not present

## 2017-12-23 DIAGNOSIS — E872 Acidosis: Secondary | ICD-10-CM | POA: Diagnosis not present

## 2017-12-23 DIAGNOSIS — I10 Essential (primary) hypertension: Secondary | ICD-10-CM | POA: Diagnosis not present

## 2017-12-28 ENCOUNTER — Encounter (INDEPENDENT_AMBULATORY_CARE_PROVIDER_SITE_OTHER): Payer: Self-pay | Admitting: Vascular Surgery

## 2017-12-28 ENCOUNTER — Encounter (INDEPENDENT_AMBULATORY_CARE_PROVIDER_SITE_OTHER): Payer: Self-pay

## 2017-12-28 ENCOUNTER — Other Ambulatory Visit (INDEPENDENT_AMBULATORY_CARE_PROVIDER_SITE_OTHER): Payer: Self-pay | Admitting: Vascular Surgery

## 2017-12-28 ENCOUNTER — Ambulatory Visit (INDEPENDENT_AMBULATORY_CARE_PROVIDER_SITE_OTHER): Payer: BLUE CROSS/BLUE SHIELD | Admitting: Vascular Surgery

## 2017-12-28 VITALS — BP 167/99 | HR 59 | Resp 17 | Ht 66.0 in | Wt 163.0 lb

## 2017-12-28 DIAGNOSIS — N1330 Unspecified hydronephrosis: Secondary | ICD-10-CM

## 2017-12-28 DIAGNOSIS — N185 Chronic kidney disease, stage 5: Secondary | ICD-10-CM | POA: Diagnosis not present

## 2017-12-28 DIAGNOSIS — D631 Anemia in chronic kidney disease: Secondary | ICD-10-CM | POA: Insufficient documentation

## 2017-12-28 NOTE — Progress Notes (Signed)
Subjective:    Patient ID: Larry [name], male    DOB: [birth year], 52 y.o.   MRN: [id number] Chief Complaint  Patient presents with  . New Patient (Initial Visit)    PD cath consult   Presents as a new patient referred by Dr. Holley Raring for evaluation of a peritoneal dialysis catheter placement.  The patient has been diagnosed with chronic kidney disease stage V with his most recent GFR at 17.  The patient has began to experience nausea in the mornings and fatigue.  Persistent metabolic acidosis despite sodium bicarbonate repletion.  The patient is also anemic from his chronic kidney disease.  The patient has never had any creation of a dialysis access in the past.  Patient has discussed his dialysis options with Dr. Holley Raring and would like to move forward with the insertion of a peritoneal dialysis catheter.  Patient denies any fever, nausea or vomiting.  Review of Systems  Constitutional: Negative.   HENT: Negative.   Eyes: Negative.   Respiratory: Negative.   Cardiovascular: Negative.   Gastrointestinal: Negative.   Endocrine: Negative.   Genitourinary:       Chronic kidney disease stage V  Musculoskeletal: Negative.   Skin: Negative.   Allergic/Immunologic: Negative.   Neurological: Negative.   Hematological: Negative.   Psychiatric/Behavioral: Negative.       Objective:   Physical Exam  Constitutional: He is oriented to person, place, and time. He appears well-developed. No distress.  HENT:  Head: Normocephalic and atraumatic.  Eyes: Pupils are equal, round, and reactive to light. Conjunctivae are normal.  Neck: Normal range of motion.  Cardiovascular: Normal rate, regular rhythm, normal heart sounds and intact distal pulses.  Pulses:      Radial pulses are 2+ on the right side, and 2+ on the left side.  Pulmonary/Chest: Effort normal and breath sounds normal. No respiratory distress. He has no wheezes. He has no rales.  Abdominal: Soft. Bowel sounds are normal. He exhibits  no distension. There is no tenderness. There is no rebound.  Musculoskeletal: Normal range of motion. He exhibits edema (Mild bilateral lower extremity edema).  Neurological: He is alert and oriented to person, place, and time.  Skin: Skin is warm and dry. He is not diaphoretic.  Psychiatric: He has a normal mood and affect. His behavior is normal. Judgment and thought content normal.  Vitals reviewed.  BP (!) 167/99 (BP Location: Right Arm, Patient Position: Sitting)   Pulse (!) 59   Resp 17   Ht 5\' 6"  (1.676 m)   Wt 163 lb (73.9 kg)   BMI 26.31 kg/m   Past Medical History:  Diagnosis Date  . Chronic kidney disease   . Hypertension   . Nephrotic syndrome    Social History   Socioeconomic History  . Marital status: Single    Spouse name: Not on file  . Number of children: Not on file  . Years of education: Not on file  . Highest education level: Not on file  Occupational History  . Not on file  Social Needs  . Financial resource strain: Not on file  . Food insecurity:    Worry: Not on file    Inability: Not on file  . Transportation needs:    Medical: Not on file    Non-medical: Not on file  Tobacco Use  . Smoking status: Current Every Day Smoker    Packs/day: 1.00    Years: 20.00    Pack years: 20.00  Types: Cigarettes  . Smokeless tobacco: Never Used  Substance and Sexual Activity  . Alcohol use: Yes    Comment: occ. alcohol use  . Drug use: No  . Sexual activity: Not on file  Lifestyle  . Physical activity:    Days per week: Not on file    Minutes per session: Not on file  . Stress: Not on file  Relationships  . Social connections:    Talks on phone: Not on file    Gets together: Not on file    Attends religious service: Not on file    Active member of club or organization: Not on file    Attends meetings of clubs or organizations: Not on file    Relationship status: Not on file  . Intimate partner violence:    Fear of current or ex partner: Not  on file    Emotionally abused: Not on file    Physically abused: Not on file    Forced sexual activity: Not on file  Other Topics Concern  . Not on file  Social History Narrative  . Not on file   Past Surgical History:  Procedure Laterality Date  . CHOLECYSTECTOMY    . CYSTOSCOPY W/ URETERAL STENT REMOVAL Right 10/23/2017   Procedure: CYSTOSCOPY WITH STENT REPLACEMENT;  Surgeon: [name], MD;  Location: [facility] OR[facility];  Service: Urology;  Laterality: Right;  . CYSTOSCOPY WITH STENT PLACEMENT Right 09/30/2017   Procedure: CYSTOSCOPY WITH STENT PLACEMENT;  Surgeon: [name], MD;  Location: [facility] OR[facility];  Service: Urology;  Laterality: Right;  . CYSTOSCOPY/RETROGRADE/URETEROSCOPY Right 10/23/2017   Procedure: CYSTOSCOPY/RETROGRADE/URETEROSCOPY;  Surgeon: [name], MD;  Location: [facility] OR[facility];  Service: Urology;  Laterality: Right;  . RENAL BIOPSY     Family History  Problem Relation Age of Onset  . Diabetes Mother   . Kidney disease Mother        mother on dialysis  . CAD Mother   . Diabetes Sister   . CAD Sister    Allergies  Allergen Reactions  . Mushroom Extract Complex     Other reaction(s): Headache migraines  . Amoxicillin Nausea And Vomiting      Assessment & Plan:  Presents as a new patient referred by Dr. Holley Raring for evaluation of a peritoneal dialysis catheter placement.  The patient has been diagnosed with chronic kidney disease stage V with his most recent GFR at 37.  The patient has began to experience nausea in the mornings and fatigue.  Persistent metabolic acidosis despite sodium bicarbonate repletion.  The patient is also anemic from his chronic kidney disease.  The patient has never had any creation of a dialysis access in the past.  Patient has discussed his dialysis options with Dr. Holley Raring and would like to move forward with the insertion of a peritoneal dialysis catheter.  Patient denies any fever, nausea or vomiting.  1. Stage 5 chronic kidney disease not  on chronic dialysis [facility]) - New Patient presents with progressively worsening chronic kidney disease At this time, the patient is not currently dialyzing however will need to dialyze in the near future. The patient is starting to experience uremic symptoms. After discussion with Dr. Holley Raring, the patient would like to move forward with peritoneal dialysis Procedure, risks and benefits explained to the patient All questions answered The patient would like to move forward  2. Hydronephrosis, unspecified hydronephrosis type - Stable The patient has a urology procedure at [facility] on January 18, 2018 and preop for this  procedure on January 04, 2018. The patient's states that he is going to be having a ureteral stent assessed.  Current Outpatient Medications on File Prior to Visit  Medication Sig Dispense Refill  . famotidine (PEPCID) 20 MG tablet Take 1 tablet (20 mg total) by mouth 2 (two) times daily. (Patient taking differently: Take 20 mg by mouth 2 (two) times daily as needed. ) 60 tablet 0  . hydrALAZINE (APRESOLINE) 50 MG tablet Take 50 mg by mouth daily.    . metoprolol succinate (TOPROL-XL) 100 MG 24 hr tablet Take 100 mg by mouth daily.    . ondansetron (ZOFRAN-ODT) 4 MG disintegrating tablet Take by mouth.    . oxyCODONE-acetaminophen (PERCOCET/ROXICET) 5-325 MG tablet Take 1 tablet by mouth every 6 (six) hours as needed for severe pain. 5 tablet 0  . sertraline (ZOLOFT) 25 MG tablet Take 1 tablet by mouth daily.  3  . sodium bicarbonate 650 MG tablet Take 1,300 mg by mouth 2 (two) times daily.    . tamsulosin (FLOMAX) 0.4 MG CAPS capsule Take 1 capsule by mouth daily.  0   Current Facility-Administered Medications on File Prior to Visit  Medication Dose Route Frequency Provider Last Rate Last Dose  . 0.9 %  sodium chloride infusion   Intravenous Continuous [name], MD 10 mL/hr at 06/14/15 1412     There are no Patient Instructions on file for this visit. No follow-ups on  file.  Tamryn Popko A Terique Kawabata, PA-C

## 2017-12-29 DIAGNOSIS — K621 Rectal polyp: Secondary | ICD-10-CM | POA: Diagnosis not present

## 2017-12-29 DIAGNOSIS — Z1211 Encounter for screening for malignant neoplasm of colon: Secondary | ICD-10-CM | POA: Diagnosis not present

## 2017-12-29 DIAGNOSIS — D369 Benign neoplasm, unspecified site: Secondary | ICD-10-CM | POA: Diagnosis not present

## 2017-12-29 DIAGNOSIS — K635 Polyp of colon: Secondary | ICD-10-CM | POA: Diagnosis not present

## 2017-12-29 DIAGNOSIS — E785 Hyperlipidemia, unspecified: Secondary | ICD-10-CM | POA: Diagnosis not present

## 2017-12-29 DIAGNOSIS — D124 Benign neoplasm of descending colon: Secondary | ICD-10-CM | POA: Diagnosis not present

## 2017-12-29 DIAGNOSIS — D123 Benign neoplasm of transverse colon: Secondary | ICD-10-CM | POA: Diagnosis not present

## 2017-12-29 DIAGNOSIS — Z79899 Other long term (current) drug therapy: Secondary | ICD-10-CM | POA: Diagnosis not present

## 2017-12-29 DIAGNOSIS — D126 Benign neoplasm of colon, unspecified: Secondary | ICD-10-CM | POA: Diagnosis not present

## 2017-12-29 DIAGNOSIS — D128 Benign neoplasm of rectum: Secondary | ICD-10-CM | POA: Diagnosis not present

## 2017-12-29 DIAGNOSIS — N186 End stage renal disease: Secondary | ICD-10-CM | POA: Diagnosis not present

## 2017-12-29 DIAGNOSIS — I120 Hypertensive chronic kidney disease with stage 5 chronic kidney disease or end stage renal disease: Secondary | ICD-10-CM | POA: Diagnosis not present

## 2017-12-29 DIAGNOSIS — D125 Benign neoplasm of sigmoid colon: Secondary | ICD-10-CM | POA: Diagnosis not present

## 2017-12-31 ENCOUNTER — Encounter
Admission: RE | Admit: 2017-12-31 | Discharge: 2017-12-31 | Disposition: A | Discharge status: Home / Self Care | Payer: BLUE CROSS/BLUE SHIELD | Source: Ambulatory Visit | Attending: Vascular Surgery | Admitting: Vascular Surgery

## 2017-12-31 ENCOUNTER — Other Ambulatory Visit: Payer: Self-pay

## 2017-12-31 DIAGNOSIS — R001 Bradycardia, unspecified: Secondary | ICD-10-CM | POA: Insufficient documentation

## 2017-12-31 DIAGNOSIS — Z01810 Encounter for preprocedural cardiovascular examination: Secondary | ICD-10-CM | POA: Diagnosis not present

## 2017-12-31 DIAGNOSIS — Z01812 Encounter for preprocedural laboratory examination: Secondary | ICD-10-CM | POA: Insufficient documentation

## 2017-12-31 DIAGNOSIS — N189 Chronic kidney disease, unspecified: Secondary | ICD-10-CM | POA: Diagnosis not present

## 2017-12-31 HISTORY — DX: Anemia, unspecified: D64.9

## 2017-12-31 LAB — CBC WITH DIFFERENTIAL/PLATELET
BASOS ABS: 0.1 10*3/uL (ref 0–0.1)
BASOS PCT: 1 %
EOS ABS: 0.3 10*3/uL (ref 0–0.7)
EOS PCT: 4 %
HCT: 38.4 % — ABNORMAL LOW (ref 40.0–52.0)
Hemoglobin: 13.1 g/dL (ref 13.0–18.0)
Lymphocytes Relative: 31 %
Lymphs Abs: 2.4 10*3/uL (ref 1.0–3.6)
MCH: 32.2 pg (ref 26.0–34.0)
MCHC: 34.1 g/dL (ref 32.0–36.0)
MCV: 94.4 fL (ref 80.0–100.0)
MONO ABS: 0.8 10*3/uL (ref 0.2–1.0)
Monocytes Relative: 10 %
Neutro Abs: 4.1 10*3/uL (ref 1.4–6.5)
Neutrophils Relative %: 54 %
PLATELETS: 280 10*3/uL (ref 150–440)
RBC: 4.07 MIL/uL — AB (ref 4.40–5.90)
RDW: 13.4 % (ref 11.5–14.5)
WBC: 7.6 10*3/uL (ref 3.8–10.6)

## 2017-12-31 LAB — BASIC METABOLIC PANEL
Anion gap: 7 (ref 5–15)
BUN: 48 mg/dL — AB (ref 6–20)
CALCIUM: 8.9 mg/dL (ref 8.9–10.3)
CO2: 19 mmol/L — ABNORMAL LOW (ref 22–32)
Chloride: 111 mmol/L (ref 101–111)
Creatinine, Ser: 4.8 mg/dL — ABNORMAL HIGH (ref 0.61–1.24)
GFR calc Af Amer: 15 mL/min — ABNORMAL LOW (ref >60)
GFR, EST NON AFRICAN AMERICAN: 13 mL/min — AB (ref >60)
GLUCOSE: 84 mg/dL (ref 65–99)
POTASSIUM: 4.3 mmol/L (ref 3.5–5.1)
SODIUM: 137 mmol/L (ref 135–145)

## 2017-12-31 LAB — TYPE AND SCREEN
ABO/RH(D): A POS
ANTIBODY SCREEN: NEGATIVE

## 2017-12-31 LAB — PROTIME-INR
INR: 0.86
PROTHROMBIN TIME: 11.6 s (ref 11.4–15.2)

## 2017-12-31 LAB — APTT: APTT: 29 s (ref 24–36)

## 2017-12-31 NOTE — Patient Instructions (Signed)
Your procedure is scheduled on: Wednesday 01/06/18  Report to [facility]. To find out your arrival time please call [phone] between 1PM - 3PM on Tuesday 01/05/18.  Remember: Instructions that are not followed completely may result in serious medical risk, up to and including death, or upon the discretion of your surgeon and anesthesiologist your surgery may need to be rescheduled.     _X__ 1. Do not eat food after midnight the night before your procedure.                 No gum chewing or hard candies. You may drink clear liquids up to 2 hours                 before you are scheduled to arrive for your surgery- DO not drink clear                 liquids within 2 hours of the start of your surgery.                 Clear Liquids include:  water, apple juice without pulp, clear carbohydrate                 drink such as Clearfast or Gatorade, Black Coffee or Tea (Do not add                 anything to coffee or tea).  __X__2.  On the morning of surgery brush your teeth with toothpaste and water, you                 may rinse your mouth with mouthwash if you wish.  Do not swallow any              toothpaste of mouthwash.     _X__ 3.  No Alcohol for 24 hours before or after surgery.   _X__ 4.  Do Not Smoke or use e-cigarettes For 24 Hours Prior to Your Surgery.                 Do not use any chewable tobacco products for at least 6 hours prior to                 surgery.  ____  5.  Bring all medications with you on the day of surgery if instructed.   __X__  6.  Notify your doctor if there is any change in your medical condition      (cold, fever, infections).     Do not wear jewelry, make-up, hairpins, clips or nail polish. Do not wear lotions, powders, or perfumes.  Do not shave 48 hours prior to surgery. Men may shave face and neck. Do not bring valuables to the hospital.    [facility] is not responsible for any belongings or  valuables.  Contacts, dentures/partials or body piercings may not be worn into surgery. Bring a case for your contacts, glasses or hearing aids, a denture cup will be supplied. Leave your suitcase in the car. After surgery it may be brought to your room. For patients admitted to the hospital, discharge time is determined by your treatment team.   Patients discharged the day of surgery will not be allowed to drive home.   Please read over the following fact sheets that you were given:   MRSA Information  __X__ Take these medicines the morning of surgery with A SIP OF WATER:  1. Pepcid  2. metoprolol  3. sertraline  4. Hydralazine as needed  5.  6.  ____ Fleet Enema (as directed)   __X__ Use CHG Soap/SAGE wipes as directed  ____ Use inhalers on the day of surgery  ____ Stop metformin/Janumet/Farxiga 2 days prior to surgery    ____ Take 1/2 of usual insulin dose the night before surgery. No insulin the morning          of surgery.   ____ Stop Blood Thinners Coumadin/Plavix/Xarelto/Pleta/Pradaxa/Eliquis/Effient/Aspirin  on   Or contact your Surgeon, Cardiologist or Medical Doctor regarding  ability to stop your blood thinners  __X__ Stop Anti-inflammatories 7 days before surgery such as Advil, Ibuprofen, Motrin,  BC or Goodies Powder, Naprosyn, Naproxen, Aleve, Aspirin    __X__ Stopall herbal supplements, fish oil or vitamin E until after surgery.    ____ Bring C-Pap to the hospital.

## 2018-01-04 DIAGNOSIS — Z01818 Encounter for other preprocedural examination: Secondary | ICD-10-CM | POA: Diagnosis not present

## 2018-01-05 MED ORDER — VANCOMYCIN HCL IN DEXTROSE 1-5 GM/200ML-% IV SOLN
1000.0000 mg | INTRAVENOUS | Status: AC
  Administered 2018-01-06: 1000 mg via INTRAVENOUS

## 2018-01-06 ENCOUNTER — Other Ambulatory Visit: Payer: Self-pay

## 2018-01-06 ENCOUNTER — Ambulatory Visit: Payer: BLUE CROSS/BLUE SHIELD | Admitting: Anesthesiology

## 2018-01-06 ENCOUNTER — Encounter
Admission: RE | Disposition: A | Discharge status: Home / Self Care | Payer: Self-pay | Source: Ambulatory Visit | Attending: Vascular Surgery

## 2018-01-06 ENCOUNTER — Encounter: Payer: Self-pay | Admitting: *Deleted

## 2018-01-06 ENCOUNTER — Ambulatory Visit
Admission: RE | Admit: 2018-01-06 | Discharge: 2018-01-06 | Disposition: A | Discharge status: Home / Self Care | Payer: BLUE CROSS/BLUE SHIELD | Source: Ambulatory Visit | Attending: Vascular Surgery | Admitting: Vascular Surgery

## 2018-01-06 DIAGNOSIS — D631 Anemia in chronic kidney disease: Secondary | ICD-10-CM | POA: Insufficient documentation

## 2018-01-06 DIAGNOSIS — Z880 Allergy status to penicillin: Secondary | ICD-10-CM | POA: Diagnosis not present

## 2018-01-06 DIAGNOSIS — Z833 Family history of diabetes mellitus: Secondary | ICD-10-CM | POA: Diagnosis not present

## 2018-01-06 DIAGNOSIS — Z79899 Other long term (current) drug therapy: Secondary | ICD-10-CM | POA: Insufficient documentation

## 2018-01-06 DIAGNOSIS — N186 End stage renal disease: Secondary | ICD-10-CM | POA: Diagnosis not present

## 2018-01-06 DIAGNOSIS — J449 Chronic obstructive pulmonary disease, unspecified: Secondary | ICD-10-CM | POA: Diagnosis not present

## 2018-01-06 DIAGNOSIS — Z841 Family history of disorders of kidney and ureter: Secondary | ICD-10-CM | POA: Insufficient documentation

## 2018-01-06 DIAGNOSIS — R600 Localized edema: Secondary | ICD-10-CM | POA: Insufficient documentation

## 2018-01-06 DIAGNOSIS — N185 Chronic kidney disease, stage 5: Secondary | ICD-10-CM | POA: Insufficient documentation

## 2018-01-06 DIAGNOSIS — Z91018 Allergy to other foods: Secondary | ICD-10-CM | POA: Insufficient documentation

## 2018-01-06 DIAGNOSIS — N1330 Unspecified hydronephrosis: Secondary | ICD-10-CM | POA: Insufficient documentation

## 2018-01-06 DIAGNOSIS — E1122 Type 2 diabetes mellitus with diabetic chronic kidney disease: Secondary | ICD-10-CM | POA: Diagnosis not present

## 2018-01-06 DIAGNOSIS — F17210 Nicotine dependence, cigarettes, uncomplicated: Secondary | ICD-10-CM | POA: Diagnosis not present

## 2018-01-06 DIAGNOSIS — I120 Hypertensive chronic kidney disease with stage 5 chronic kidney disease or end stage renal disease: Secondary | ICD-10-CM | POA: Diagnosis not present

## 2018-01-06 DIAGNOSIS — Z9049 Acquired absence of other specified parts of digestive tract: Secondary | ICD-10-CM | POA: Diagnosis not present

## 2018-01-06 DIAGNOSIS — Z8249 Family history of ischemic heart disease and other diseases of the circulatory system: Secondary | ICD-10-CM | POA: Insufficient documentation

## 2018-01-06 HISTORY — PX: CAPD INSERTION: SHX5233

## 2018-01-06 LAB — ABO/RH: ABO/RH(D): A POS

## 2018-01-06 SURGERY — LAPAROSCOPIC INSERTION CONTINUOUS AMBULATORY PERITONEAL DIALYSIS  (CAPD) CATHETER
Anesthesia: General | Wound class: Clean

## 2018-01-06 MED ORDER — BUPIVACAINE-EPINEPHRINE (PF) 0.25% -1:200000 IJ SOLN
INTRAMUSCULAR | Status: AC
  Filled 2018-01-06: qty 30

## 2018-01-06 MED ORDER — CHLORHEXIDINE GLUCONATE CLOTH 2 % EX PADS: 6.0000 | MEDICATED_PAD | Freq: Once | CUTANEOUS | Status: DC

## 2018-01-06 MED ORDER — SODIUM CHLORIDE 0.9 % IV SOLN
INTRAVENOUS | Status: DC
  Administered 2018-01-06: 10:00:00 via INTRAVENOUS

## 2018-01-06 MED ORDER — PROPOFOL 10 MG/ML IV BOLUS
INTRAVENOUS | Status: AC
  Filled 2018-01-06: qty 20

## 2018-01-06 MED ORDER — ONDANSETRON HCL 4 MG/2ML IJ SOLN
INTRAMUSCULAR | Status: AC
  Filled 2018-01-06: qty 2

## 2018-01-06 MED ORDER — NEOSTIGMINE METHYLSULFATE 10 MG/10ML IV SOLN
INTRAVENOUS | Status: DC | PRN
  Administered 2018-01-06: 4 mg via INTRAVENOUS

## 2018-01-06 MED ORDER — ROCURONIUM BROMIDE 50 MG/5ML IV SOLN
INTRAVENOUS | Status: AC
  Filled 2018-01-06: qty 1

## 2018-01-06 MED ORDER — LIDOCAINE HCL (PF) 2 % IJ SOLN
INTRAMUSCULAR | Status: AC
  Filled 2018-01-06: qty 10

## 2018-01-06 MED ORDER — ONDANSETRON HCL 4 MG/2ML IJ SOLN
INTRAMUSCULAR | Status: DC | PRN
  Administered 2018-01-06: 4 mg via INTRAVENOUS

## 2018-01-06 MED ORDER — OXYCODONE HCL 5 MG/5ML PO SOLN: 5.0000 mg | Freq: Once | ORAL | Status: AC | PRN

## 2018-01-06 MED ORDER — LIDOCAINE HCL (CARDIAC) 20 MG/ML IV SOLN
INTRAVENOUS | Status: DC | PRN
  Administered 2018-01-06: 60 mg via INTRAVENOUS

## 2018-01-06 MED ORDER — OXYCODONE HCL 5 MG PO TABS
ORAL_TABLET | ORAL | Status: AC
  Filled 2018-01-06: qty 1

## 2018-01-06 MED ORDER — SUCCINYLCHOLINE CHLORIDE 20 MG/ML IJ SOLN
INTRAMUSCULAR | Status: DC | PRN
  Administered 2018-01-06: 140 mg via INTRAVENOUS

## 2018-01-06 MED ORDER — SUCCINYLCHOLINE CHLORIDE 20 MG/ML IJ SOLN
INTRAMUSCULAR | Status: AC
  Filled 2018-01-06: qty 1

## 2018-01-06 MED ORDER — EPHEDRINE SULFATE 50 MG/ML IJ SOLN
INTRAMUSCULAR | Status: AC
Start: 2018-01-06 — End: ?
  Filled 2018-01-06: qty 1

## 2018-01-06 MED ORDER — FAMOTIDINE 20 MG PO TABS
ORAL_TABLET | ORAL | Status: AC
  Filled 2018-01-06: qty 1

## 2018-01-06 MED ORDER — PROPOFOL 10 MG/ML IV BOLUS
INTRAVENOUS | Status: DC | PRN
  Administered 2018-01-06: 100 mg via INTRAVENOUS

## 2018-01-06 MED ORDER — SODIUM CHLORIDE FLUSH 0.9 % IV SOLN
INTRAVENOUS | Status: AC
  Filled 2018-01-06: qty 10

## 2018-01-06 MED ORDER — HYDROCODONE-ACETAMINOPHEN 5-325 MG PO TABS: 1.0000 | ORAL_TABLET | Freq: Four times a day (QID) | ORAL | 0 refills | Status: DC | PRN

## 2018-01-06 MED ORDER — PROMETHAZINE HCL 25 MG/ML IJ SOLN
INTRAMUSCULAR | Status: AC
  Administered 2018-01-06: 12.5 mg via INTRAVENOUS
  Filled 2018-01-06: qty 1

## 2018-01-06 MED ORDER — MIDAZOLAM HCL 2 MG/2ML IJ SOLN
INTRAMUSCULAR | Status: AC
  Filled 2018-01-06: qty 2

## 2018-01-06 MED ORDER — BUPIVACAINE-EPINEPHRINE (PF) 0.25% -1:200000 IJ SOLN
INTRAMUSCULAR | Status: DC | PRN
  Administered 2018-01-06: 20 mL via PERINEURAL
  Administered 2018-01-06: 8 mL via PERINEURAL

## 2018-01-06 MED ORDER — VANCOMYCIN HCL IN DEXTROSE 1-5 GM/200ML-% IV SOLN
INTRAVENOUS | Status: DC
Start: 2018-01-06 — End: 2018-01-06
  Filled 2018-01-06: qty 200

## 2018-01-06 MED ORDER — GLYCOPYRROLATE 0.2 MG/ML IJ SOLN
INTRAMUSCULAR | Status: DC | PRN
  Administered 2018-01-06: 0.6 mg via INTRAVENOUS

## 2018-01-06 MED ORDER — OXYCODONE HCL 5 MG PO TABS
5.0000 mg | ORAL_TABLET | Freq: Once | ORAL | Status: AC | PRN
  Administered 2018-01-06: 5 mg via ORAL

## 2018-01-06 MED ORDER — SUGAMMADEX SODIUM 200 MG/2ML IV SOLN
INTRAVENOUS | Status: AC
  Filled 2018-01-06: qty 2

## 2018-01-06 MED ORDER — FENTANYL CITRATE (PF) 100 MCG/2ML IJ SOLN
INTRAMUSCULAR | Status: DC | PRN
  Administered 2018-01-06 (×2): 50 ug via INTRAVENOUS
  Administered 2018-01-06: 100 ug via INTRAVENOUS

## 2018-01-06 MED ORDER — MIDAZOLAM HCL 2 MG/2ML IJ SOLN
INTRAMUSCULAR | Status: DC | PRN
  Administered 2018-01-06: 2 mg via INTRAVENOUS

## 2018-01-06 MED ORDER — DEXAMETHASONE SODIUM PHOSPHATE 10 MG/ML IJ SOLN
INTRAMUSCULAR | Status: AC
Start: 2018-01-06 — End: ?
  Filled 2018-01-06: qty 1

## 2018-01-06 MED ORDER — FENTANYL CITRATE (PF) 100 MCG/2ML IJ SOLN: 25.0000 ug | INTRAMUSCULAR | Status: DC | PRN

## 2018-01-06 MED ORDER — PHENYLEPHRINE HCL 10 MG/ML IJ SOLN
INTRAMUSCULAR | Status: DC | PRN
  Administered 2018-01-06 (×2): 100 ug via INTRAVENOUS

## 2018-01-06 MED ORDER — PROMETHAZINE HCL 25 MG/ML IJ SOLN
12.5000 mg | Freq: Once | INTRAMUSCULAR | Status: AC
  Administered 2018-01-06: 12.5 mg via INTRAVENOUS

## 2018-01-06 MED ORDER — FENTANYL CITRATE (PF) 250 MCG/5ML IJ SOLN
INTRAMUSCULAR | Status: AC
  Filled 2018-01-06: qty 5

## 2018-01-06 MED ORDER — EPHEDRINE SULFATE 50 MG/ML IJ SOLN
INTRAMUSCULAR | Status: DC | PRN
  Administered 2018-01-06 (×2): 5 mg via INTRAVENOUS

## 2018-01-06 MED ORDER — DEXAMETHASONE SODIUM PHOSPHATE 10 MG/ML IJ SOLN
INTRAMUSCULAR | Status: DC | PRN
  Administered 2018-01-06: 5 mg via INTRAVENOUS

## 2018-01-06 MED ORDER — ROCURONIUM BROMIDE 100 MG/10ML IV SOLN
INTRAVENOUS | Status: DC | PRN
  Administered 2018-01-06: 30 mg via INTRAVENOUS
  Administered 2018-01-06: 5 mg via INTRAVENOUS

## 2018-01-06 SURGICAL SUPPLY — 43 items
ADAPTER BETA CAP QUINTON DIALY (ADAPTER) ×2 IMPLANT
ADAPTER CATH DIALYSIS 18.75 (CATHETERS) ×1 IMPLANT
ADH SKN CLS APL DERMABOND .7 (GAUZE/BANDAGES/DRESSINGS) ×1
ADPR DLYS BCP STRL PRTNL ULTEM (ADAPTER) ×1
BLADE SURG 15 STRL LF DISP TIS (BLADE) ×1 IMPLANT
BLADE SURG 15 STRL SS (BLADE) ×2
CANISTER SUCT 1200ML W/VALVE (MISCELLANEOUS) ×2 IMPLANT
CATH DLYS SWAN NECK 62.5CM (CATHETERS) ×1 IMPLANT
CHLORAPREP W/TINT 26ML (MISCELLANEOUS) ×2 IMPLANT
DERMABOND ADVANCED (GAUZE/BANDAGES/DRESSINGS) ×1
DERMABOND ADVANCED .7 DNX12 (GAUZE/BANDAGES/DRESSINGS) ×1 IMPLANT
ELECT REM PT RETURN 9FT ADLT (ELECTROSURGICAL) ×2
ELECTRODE REM PT RTRN 9FT ADLT (ELECTROSURGICAL) ×1 IMPLANT
GAUZE SPONGE 4X4 12PLY STRL (GAUZE/BANDAGES/DRESSINGS) ×2 IMPLANT
GLOVE BIO SURGEON STRL SZ7 (GLOVE) ×2 IMPLANT
GLOVE INDICATOR 7.5 STRL GRN (GLOVE) ×2 IMPLANT
GLOVE SURG SYN 8.0 (GLOVE) ×2 IMPLANT
GLOVE SURG SYN 8.0 PF PI (GLOVE) ×1 IMPLANT
GOWN STRL REUS W/ TWL LRG LVL3 (GOWN DISPOSABLE) ×2 IMPLANT
GOWN STRL REUS W/ TWL XL LVL3 (GOWN DISPOSABLE) ×1 IMPLANT
GOWN STRL REUS W/TWL LRG LVL3 (GOWN DISPOSABLE) ×4
GOWN STRL REUS W/TWL XL LVL3 (GOWN DISPOSABLE) ×2
KIT TURNOVER KIT A (KITS) ×2 IMPLANT
LABEL OR SOLS (LABEL) ×2 IMPLANT
MINICAP W/POVIDONE IODINE SOL (MISCELLANEOUS) ×2 IMPLANT
NDL HYPO 25X1 1.5 SAFETY (NEEDLE) ×1 IMPLANT
NEEDLE HYPO 25X1 1.5 SAFETY (NEEDLE) ×2 IMPLANT
NEEDLE INSUFFLATION 150MM (ENDOMECHANICALS) ×2 IMPLANT
NS IRRIG 500ML POUR BTL (IV SOLUTION) ×2 IMPLANT
PACK LAP CHOLECYSTECTOMY (MISCELLANEOUS) ×2 IMPLANT
PENCIL ELECTRO HAND CTR (MISCELLANEOUS) ×2 IMPLANT
SET TRANSFER 6 W/TWIST CLAMP 5 (SET/KITS/TRAYS/PACK) ×2 IMPLANT
SLEEVE ENDOPATH XCEL 5M (ENDOMECHANICALS) ×1 IMPLANT
SUT MNCRL 4-0 (SUTURE) ×2
SUT MNCRL 4-0 27XMFL (SUTURE) ×1
SUT MNCRL AB 4-0 PS2 18 (SUTURE) ×2 IMPLANT
SUT VIC AB 3-0 SH 27 (SUTURE) ×4
SUT VIC AB 3-0 SH 27X BRD (SUTURE) ×2 IMPLANT
SUT VICRYL 0 AB UR-6 (SUTURE) ×4 IMPLANT
SUTURE MNCRL 4-0 27XMF (SUTURE) IMPLANT
SYR 50ML LL SCALE MARK (SYRINGE) ×2 IMPLANT
TROCAR XCEL NON-BLD 5MMX100MML (ENDOMECHANICALS) ×2 IMPLANT
TUBING INSUFFLATION (TUBING) ×2 IMPLANT

## 2018-01-06 NOTE — Discharge Instructions (Signed)

## 2018-01-06 NOTE — Op Note (Signed)
  OPERATIVE NOTE   PROCEDURE: 1. Laparoscopic peritoneal dialysis catheter placement.  PRE-OPERATIVE DIAGNOSIS: 1. Stage 5 renal insufficiency 2. Hypertension  POST-OPERATIVE DIAGNOSIS: Same  SURGEON: Hortencia Pilar  ASSISTANT(S): None  ANESTHESIA: general  ESTIMATED BLOOD LOSS: Minimal   FINDING(S): 1. None  SPECIMEN(S): None  INDICATIONS:  Larry [name] is a 52 y.o. male who presents with Stage V renal insufficiency who will need dialysis in the near future. The patient has decided to do peritoneal dialysis for his long-term dialysis. Risks and benefits of placement were discussed and he is agreeable to proceed.  Differences between peritoneal dialysis and hemodialysis were discussed.    DESCRIPTION:  After obtaining full informed written consent, the patient was brought back to the operating room and placed supine upon the operating table. The patient received IV antibiotics prior to induction. After obtaining adequate anesthesia, the abdomen was prepped and draped in the standard fashion.   A small vertical incision was made in the midline just above the umbilicus and the dissection was carried down through the soft tissue to expose the fascia. Two 0 Vicryl sutures were then used to secure the fascia just lateral to the midline on both sides and an 15 blade was used to incise the fascia. The fascia is then elevated with a bone hook Varies needle is introduced and a saline test was performed indicating intraperitoneal location and insufflated of the abdomen with carbon dioxide is performed to 15 mmHg pressure. A 5 mm trocar is then placed again maintaining elevation of the fascia with a bone hook.  A oblique incision is then made in the left lower quadrant to the lateral portion of the rectus muscle the dissection is carried down through the soft tissues and the fascia is exposed. Small incision is made in the fascia with the Bovie and a pursestring suture of 0 Vicryl is  placed. Upon initial attempt of placing the Seldinger needle the adhesions are obstructing the catheter positioning and view of the placement and therefore they must be removed.  Seldinger needle was then inserted under direct visualization and the wire introduced under the view of the camera trocar was then placed and the catheter was advanced into the abdominal cavity over the trocar. Under direct visualization the catheters curled portion was positioned deep into the pelvis just to the right of midline. It was irrigated with heparinized saline. The deep cuff was secured to the fascial pursestring suture. A small counterincision was made in the right upper quadrant of the abdomen and the catheter was brought out this site. The appropriate distal connectors were placed, and I then placed 300 cc of saline through the catheter into the pelvis. The abdomen was desufflated. Immediately, 250 cc of effluent returned through the catheter when the bag was placed to gravity. I took one more look with the camera to ensure that the catheter was in the pelvis and it was. The hasson trocar was then removed. I then closed the incisions with a 2-0 Vicryl in the right lower quadrant incision and subsequently 3-0 Vicryl and 4-0 Monocryl, the other incisions were closed with 3-0 Vicryl and 4-0 Monocryl and placed Dermabond as dressing. Dry dressing was placed around the catheter exit site. The patient was then awakened from anesthesia and taken to the recovery room in stable condition having tolerated the procedure well.   COMPLICATIONS: None  CONDITION: None  Hortencia Pilar 01/06/2018, 1:29 PM

## 2018-01-06 NOTE — Transfer of Care (Signed)
Immediate Anesthesia Transfer of Care Note  Patient: Larry [name]  Procedure(s) Performed: LAPAROSCOPIC INSERTION CONTINUOUS AMBULATORY PERITONEAL DIALYSIS  (CAPD) CATHETER (N/A )  Patient Location: PACU  Anesthesia Type:General  Level of Consciousness: awake, alert  and oriented  Airway & Oxygen Therapy: Patient Spontanous Breathing and Patient connected to face mask oxygen  Post-op Assessment: Report given to RN and Post -op Vital signs reviewed and stable  Post vital signs: Reviewed and stable  Last Vitals:  Vitals Value Taken Time  BP    Temp    Pulse 88 01/06/2018  1:34 PM  Resp 11 01/06/2018  1:34 PM  SpO2 100 % 01/06/2018  1:34 PM  Vitals shown include unvalidated device data.  Last Pain:  Vitals:   01/06/18 0953  TempSrc: Temporal  PainSc: 0-No pain         Complications: No apparent anesthesia complications

## 2018-01-06 NOTE — OR Nursing (Signed)
Discussed discharge instructions with pt and wife. Both voice understanding. 

## 2018-01-06 NOTE — Anesthesia Preprocedure Evaluation (Signed)
Anesthesia Evaluation  Patient identified by MRN, date of birth, ID band Patient awake    Reviewed: Allergy & Precautions, H&P , NPO status , Patient's Chart, lab work & pertinent test results  History of Anesthesia Complications Negative for: history of anesthetic complications  Airway Mallampati: III  TM Distance: >3 FB Neck ROM: full    Dental  (+) Chipped, Poor Dentition, Missing   Pulmonary neg shortness of breath, COPD, former smoker,           Cardiovascular Exercise Tolerance: Good hypertension, (-) angina(-) Past MI and (-) DOE      Neuro/Psych negative neurological ROS  negative psych ROS   GI/Hepatic negative GI ROS, Neg liver ROS, neg GERD  ,  Endo/Other  negative endocrine ROS  Renal/GU ESRFRenal disease     Musculoskeletal   Abdominal   Peds  Hematology negative hematology ROS (+)   Anesthesia Other Findings Past Medical History: No date: Anemia No date: Chronic kidney disease No date: Hypertension No date: Nephrotic syndrome  Past Surgical History: No date: CHOLECYSTECTOMY 10/23/2017: CYSTOSCOPY W/ URETERAL STENT REMOVAL; Right     Comment:  Procedure: CYSTOSCOPY WITH STENT REPLACEMENT;  Surgeon:               [name], MD;  Location: [facility];  Service:               Urology;  Laterality: Right; 09/30/2017: CYSTOSCOPY WITH STENT PLACEMENT; Right     Comment:  Procedure: CYSTOSCOPY WITH STENT PLACEMENT;  Surgeon:               [name], MD;  Location: [facility];  Service:               Urology;  Laterality: Right; 10/23/2017: CYSTOSCOPY/RETROGRADE/URETEROSCOPY; Right     Comment:  Procedure: CYSTOSCOPY/RETROGRADE/URETEROSCOPY;  Surgeon:              [name], MD;  Location: [facility];  Service:               Urology;  Laterality: Right; No date: RENAL BIOPSY  BMI    Body Mass Index:  26.31 kg/m      Reproductive/Obstetrics negative OB ROS                              Anesthesia Physical Anesthesia Plan  ASA: III  Anesthesia Plan: General ETT   Post-op Pain Management:    Induction: Intravenous  PONV Risk Score and Plan: 3 and Ondansetron, Dexamethasone and Midazolam  Airway Management Planned: Oral ETT  Additional Equipment:   Intra-op Plan:   Post-operative Plan: Extubation in OR  Informed Consent: I have reviewed the patients History and Physical, chart, labs and discussed the procedure including the risks, benefits and alternatives for the proposed anesthesia with the patient or authorized representative who has indicated his/her understanding and acceptance.   Dental Advisory Given  Plan Discussed with: Anesthesiologist, CRNA and Surgeon  Anesthesia Plan Comments: (Patient consented for risks of anesthesia including but not limited to:  - adverse reactions to medications - damage to teeth, lips or other oral mucosa - sore throat or hoarseness - Damage to heart, brain, lungs or loss of life  Patient voiced understanding.)        Anesthesia Quick Evaluation

## 2018-01-06 NOTE — H&P (Signed)
[facility] History & Physical Update  The patient was interviewed and re-examined.  The patient's previous History and Physical has been reviewed and is unchanged.  There is no change in the plan of care. We plan to proceed with the scheduled procedure.  Hortencia Pilar, MD  01/06/2018, 11:51 AM

## 2018-01-06 NOTE — Anesthesia Post-op Follow-up Note (Signed)
Anesthesia QCDR form completed.        

## 2018-01-07 NOTE — Anesthesia Postprocedure Evaluation (Signed)
Anesthesia Post Note  Patient: Larry [name]  Procedure(s) Performed: LAPAROSCOPIC INSERTION CONTINUOUS AMBULATORY PERITONEAL DIALYSIS  (CAPD) CATHETER (N/A )  Patient location during evaluation: PACU Anesthesia Type: General Level of consciousness: awake and alert Pain management: pain level controlled Vital Signs Assessment: post-procedure vital signs reviewed and stable Respiratory status: spontaneous breathing, nonlabored ventilation, respiratory function stable and patient connected to nasal cannula oxygen Cardiovascular status: blood pressure returned to baseline and stable Postop Assessment: no apparent nausea or vomiting Anesthetic complications: no     Last Vitals:  Vitals:   01/06/18 1407 01/06/18 1419  BP:  (!) 145/87  Pulse: 65 64  Resp: 19 16  Temp: 36.4 C (!) 36.3 C  SpO2: 98% 100%    Last Pain:  Vitals:   01/06/18 1538  TempSrc:   PainSc: 3                  Precious Haws Talor Cheema

## 2018-01-18 DIAGNOSIS — I129 Hypertensive chronic kidney disease with stage 1 through stage 4 chronic kidney disease, or unspecified chronic kidney disease: Secondary | ICD-10-CM | POA: Diagnosis not present

## 2018-01-18 DIAGNOSIS — N1330 Unspecified hydronephrosis: Secondary | ICD-10-CM | POA: Diagnosis not present

## 2018-01-18 DIAGNOSIS — D4959 Neoplasm of unspecified behavior of other genitourinary organ: Secondary | ICD-10-CM | POA: Diagnosis not present

## 2018-01-18 DIAGNOSIS — N179 Acute kidney failure, unspecified: Secondary | ICD-10-CM | POA: Diagnosis not present

## 2018-01-18 DIAGNOSIS — N135 Crossing vessel and stricture of ureter without hydronephrosis: Secondary | ICD-10-CM | POA: Diagnosis not present

## 2018-01-18 DIAGNOSIS — N189 Chronic kidney disease, unspecified: Secondary | ICD-10-CM | POA: Diagnosis not present

## 2018-01-18 DIAGNOSIS — Z87891 Personal history of nicotine dependence: Secondary | ICD-10-CM | POA: Diagnosis not present

## 2018-01-18 DIAGNOSIS — Z79899 Other long term (current) drug therapy: Secondary | ICD-10-CM | POA: Diagnosis not present

## 2018-01-18 DIAGNOSIS — Z880 Allergy status to penicillin: Secondary | ICD-10-CM | POA: Diagnosis not present

## 2018-01-18 DIAGNOSIS — E785 Hyperlipidemia, unspecified: Secondary | ICD-10-CM | POA: Diagnosis not present

## 2018-01-18 DIAGNOSIS — N289 Disorder of kidney and ureter, unspecified: Secondary | ICD-10-CM | POA: Diagnosis not present

## 2018-01-21 ENCOUNTER — Encounter (INDEPENDENT_AMBULATORY_CARE_PROVIDER_SITE_OTHER): Payer: Self-pay | Admitting: Vascular Surgery

## 2018-01-21 ENCOUNTER — Other Ambulatory Visit: Payer: Self-pay

## 2018-01-21 ENCOUNTER — Ambulatory Visit (INDEPENDENT_AMBULATORY_CARE_PROVIDER_SITE_OTHER): Payer: BLUE CROSS/BLUE SHIELD | Admitting: Vascular Surgery

## 2018-01-21 VITALS — BP 157/89 | HR 62 | Resp 17 | Ht 66.0 in | Wt 164.0 lb

## 2018-01-21 DIAGNOSIS — N185 Chronic kidney disease, stage 5: Secondary | ICD-10-CM

## 2018-01-21 NOTE — Progress Notes (Signed)
Patient ID: Larry [name], male   DOB: Jul 04, 1966, 52 y.o.   MRN: [id number]  Chief Complaint  Patient presents with  . Follow-up    Peritoneal dialysis catheter placemnt    HPI Larry [name] is a 52 y.o. male.    PROCEDURE 01/06/2018: 1. Laparoscopic peritoneal dialysis catheter placement.  Patient denies pain at the incision sites.  No drainage from the incision sites.  No fever chills. He states they have flushed it several times and is working well.    Past Medical History:  Diagnosis Date  . Anemia   . Chronic kidney disease   . Hypertension   . Nephrotic syndrome     Past Surgical History:  Procedure Laterality Date  . CAPD INSERTION N/A 01/06/2018   Procedure: LAPAROSCOPIC INSERTION CONTINUOUS AMBULATORY PERITONEAL DIALYSIS  (CAPD) CATHETER;  Surgeon: Katha Cabal, MD;  Location: [facility];  Service: Vascular;  Laterality: N/A;  . CHOLECYSTECTOMY    . CYSTOSCOPY W/ URETERAL STENT REMOVAL Right 10/23/2017   Procedure: CYSTOSCOPY WITH STENT REPLACEMENT;  Surgeon: [name], MD;  Location: [facility];  Service: Urology;  Laterality: Right;  . CYSTOSCOPY WITH STENT PLACEMENT Right 09/30/2017   Procedure: CYSTOSCOPY WITH STENT PLACEMENT;  Surgeon: [name], MD;  Location: [facility];  Service: Urology;  Laterality: Right;  . CYSTOSCOPY/RETROGRADE/URETEROSCOPY Right 10/23/2017   Procedure: CYSTOSCOPY/RETROGRADE/URETEROSCOPY;  Surgeon: [name], MD;  Location: [facility];  Service: Urology;  Laterality: Right;  . RENAL BIOPSY        Allergies  Allergen Reactions  . Mushroom Extract Complex     Headache, migraines  . Amoxicillin Nausea And Vomiting    Weakness  Has patient had a PCN reaction causing immediate rash, facial/tongue/throat swelling, SOB or lightheadedness with hypotension: No Has patient had a PCN reaction causing severe rash involving mucus membranes or skin necrosis: No Has patient had a PCN reaction that required hospitalization:  No Has patient had a PCN reaction occurring within the last 10 years: No If all of the above answers are "NO", then may proceed with Cephalosporin use.     Current Outpatient Medications  Medication Sig Dispense Refill  . famotidine (PEPCID) 20 MG tablet Take 1 tablet (20 mg total) by mouth 2 (two) times daily. (Patient taking differently: Take 20 mg by mouth daily as needed for heartburn. ) 60 tablet 0  . hydrALAZINE (APRESOLINE) 50 MG tablet Take 50 mg by mouth daily as needed (If diastolic bp is 90 or above).     Marland Kitchen ibuprofen (ADVIL,MOTRIN) 200 MG tablet Take 400 mg by mouth daily as needed for headache or moderate pain.    . metoprolol succinate (TOPROL-XL) 100 MG 24 hr tablet Take 100 mg by mouth daily.    . nicotine (NICODERM CQ - DOSED IN MG/24 HOURS) 14 mg/24hr patch Place 14 mg onto the skin daily.    Marland Kitchen oxybutynin (DITROPAN) 5 MG tablet Take by mouth.    . sertraline (ZOLOFT) 25 MG tablet Take 25 mg by mouth daily.   3  . sodium bicarbonate 650 MG tablet Take 1,300 mg by mouth 2 (two) times daily.     Marland Kitchen HYDROcodone-acetaminophen (NORCO) 5-325 MG tablet Take 1-2 tablets by mouth every 6 (six) hours as needed. (Patient not taking: Reported on 01/21/2018) 35 tablet 0   No current facility-administered medications for this visit.    Facility-Administered Medications Ordered in Other Visits  Medication Dose Route Frequency Provider Last Rate Last Dose  .  0.9 %  sodium chloride infusion   Intravenous Continuous [name], MD 10 mL/hr at 06/14/15 1412          Physical Exam BP (!) 157/89 (BP Location: Right Arm)   Pulse 62   Resp 17   Ht 5\' 6"  (1.676 m)   Wt 164 lb (74.4 kg)   BMI 26.47 kg/m  Gen:  WD/WN, NAD Skin: incision C/D/I; catheter intact     Assessment/Plan:  1. Stage 5 chronic kidney disease not on chronic dialysis [facility]) Recommend:  The patient is doing well and currently has adequate dialysis access. The patient's dialysis center is not reporting  any access issues.  The patient will follow-up with me prn          Hortencia Pilar 01/21/2018, 12:28 PM   This note was created with Dragon medical transcription system.  Any errors from dictation are unintentional.

## 2018-02-04 DIAGNOSIS — E872 Acidosis: Secondary | ICD-10-CM | POA: Diagnosis not present

## 2018-02-04 DIAGNOSIS — N185 Chronic kidney disease, stage 5: Secondary | ICD-10-CM | POA: Diagnosis not present

## 2018-02-04 DIAGNOSIS — D631 Anemia in chronic kidney disease: Secondary | ICD-10-CM | POA: Diagnosis not present

## 2018-02-04 DIAGNOSIS — R809 Proteinuria, unspecified: Secondary | ICD-10-CM | POA: Diagnosis not present

## 2018-02-04 DIAGNOSIS — N2581 Secondary hyperparathyroidism of renal origin: Secondary | ICD-10-CM | POA: Diagnosis not present

## 2018-02-08 DIAGNOSIS — N185 Chronic kidney disease, stage 5: Secondary | ICD-10-CM | POA: Diagnosis not present

## 2018-02-09 DIAGNOSIS — Z992 Dependence on renal dialysis: Secondary | ICD-10-CM | POA: Diagnosis not present

## 2018-02-09 DIAGNOSIS — N186 End stage renal disease: Secondary | ICD-10-CM | POA: Diagnosis not present

## 2018-02-10 DIAGNOSIS — Z992 Dependence on renal dialysis: Secondary | ICD-10-CM | POA: Diagnosis not present

## 2018-02-10 DIAGNOSIS — N186 End stage renal disease: Secondary | ICD-10-CM | POA: Diagnosis not present

## 2018-02-12 ENCOUNTER — Ambulatory Visit
Admission: RE | Admit: 2018-02-12 | Discharge: 2018-02-12 | Disposition: A | Discharge status: Home / Self Care | Payer: BLUE CROSS/BLUE SHIELD | Source: Ambulatory Visit | Attending: Nephrology | Admitting: Nephrology

## 2018-02-12 ENCOUNTER — Other Ambulatory Visit: Payer: Self-pay | Admitting: Nephrology

## 2018-02-12 DIAGNOSIS — Z992 Dependence on renal dialysis: Secondary | ICD-10-CM | POA: Diagnosis not present

## 2018-02-12 DIAGNOSIS — T8571XA Infection and inflammatory reaction due to peritoneal dialysis catheter, initial encounter: Secondary | ICD-10-CM | POA: Diagnosis not present

## 2018-02-12 DIAGNOSIS — N186 End stage renal disease: Secondary | ICD-10-CM | POA: Diagnosis not present

## 2018-02-12 DIAGNOSIS — T85611A Breakdown (mechanical) of intraperitoneal dialysis catheter, initial encounter: Secondary | ICD-10-CM | POA: Insufficient documentation

## 2018-02-12 DIAGNOSIS — Y838 Other surgical procedures as the cause of abnormal reaction of the patient, or of later complication, without mention of misadventure at the time of the procedure: Secondary | ICD-10-CM | POA: Diagnosis not present

## 2018-02-17 DIAGNOSIS — Z992 Dependence on renal dialysis: Secondary | ICD-10-CM | POA: Diagnosis not present

## 2018-02-17 DIAGNOSIS — N186 End stage renal disease: Secondary | ICD-10-CM | POA: Diagnosis not present

## 2018-02-18 DIAGNOSIS — N186 End stage renal disease: Secondary | ICD-10-CM | POA: Diagnosis not present

## 2018-02-18 DIAGNOSIS — Z992 Dependence on renal dialysis: Secondary | ICD-10-CM | POA: Diagnosis not present

## 2018-02-19 DIAGNOSIS — Z992 Dependence on renal dialysis: Secondary | ICD-10-CM | POA: Diagnosis not present

## 2018-02-19 DIAGNOSIS — N186 End stage renal disease: Secondary | ICD-10-CM | POA: Diagnosis not present

## 2018-02-22 DIAGNOSIS — N186 End stage renal disease: Secondary | ICD-10-CM | POA: Diagnosis not present

## 2018-02-22 DIAGNOSIS — Z992 Dependence on renal dialysis: Secondary | ICD-10-CM | POA: Diagnosis not present

## 2018-02-23 DIAGNOSIS — Z992 Dependence on renal dialysis: Secondary | ICD-10-CM | POA: Diagnosis not present

## 2018-02-23 DIAGNOSIS — N186 End stage renal disease: Secondary | ICD-10-CM | POA: Diagnosis not present

## 2018-02-24 DIAGNOSIS — N186 End stage renal disease: Secondary | ICD-10-CM | POA: Diagnosis not present

## 2018-02-24 DIAGNOSIS — Z992 Dependence on renal dialysis: Secondary | ICD-10-CM | POA: Diagnosis not present

## 2018-02-25 DIAGNOSIS — N186 End stage renal disease: Secondary | ICD-10-CM | POA: Diagnosis not present

## 2018-02-25 DIAGNOSIS — Z23 Encounter for immunization: Secondary | ICD-10-CM | POA: Diagnosis not present

## 2018-02-25 DIAGNOSIS — Z992 Dependence on renal dialysis: Secondary | ICD-10-CM | POA: Diagnosis not present

## 2018-02-26 DIAGNOSIS — Z23 Encounter for immunization: Secondary | ICD-10-CM | POA: Diagnosis not present

## 2018-02-26 DIAGNOSIS — Z992 Dependence on renal dialysis: Secondary | ICD-10-CM | POA: Diagnosis not present

## 2018-02-26 DIAGNOSIS — N186 End stage renal disease: Secondary | ICD-10-CM | POA: Diagnosis not present

## 2018-02-27 DIAGNOSIS — Z23 Encounter for immunization: Secondary | ICD-10-CM | POA: Diagnosis not present

## 2018-02-27 DIAGNOSIS — N186 End stage renal disease: Secondary | ICD-10-CM | POA: Diagnosis not present

## 2018-02-27 DIAGNOSIS — Z992 Dependence on renal dialysis: Secondary | ICD-10-CM | POA: Diagnosis not present

## 2018-02-28 DIAGNOSIS — Z992 Dependence on renal dialysis: Secondary | ICD-10-CM | POA: Diagnosis not present

## 2018-02-28 DIAGNOSIS — N186 End stage renal disease: Secondary | ICD-10-CM | POA: Diagnosis not present

## 2018-02-28 DIAGNOSIS — Z23 Encounter for immunization: Secondary | ICD-10-CM | POA: Diagnosis not present

## 2018-03-01 DIAGNOSIS — N186 End stage renal disease: Secondary | ICD-10-CM | POA: Diagnosis not present

## 2018-03-01 DIAGNOSIS — Z992 Dependence on renal dialysis: Secondary | ICD-10-CM | POA: Diagnosis not present

## 2018-03-01 DIAGNOSIS — Z23 Encounter for immunization: Secondary | ICD-10-CM | POA: Diagnosis not present

## 2018-03-02 DIAGNOSIS — N186 End stage renal disease: Secondary | ICD-10-CM | POA: Diagnosis not present

## 2018-03-02 DIAGNOSIS — Z23 Encounter for immunization: Secondary | ICD-10-CM | POA: Diagnosis not present

## 2018-03-02 DIAGNOSIS — Z992 Dependence on renal dialysis: Secondary | ICD-10-CM | POA: Diagnosis not present

## 2018-03-03 DIAGNOSIS — Z992 Dependence on renal dialysis: Secondary | ICD-10-CM | POA: Diagnosis not present

## 2018-03-03 DIAGNOSIS — N186 End stage renal disease: Secondary | ICD-10-CM | POA: Diagnosis not present

## 2018-03-03 DIAGNOSIS — Z23 Encounter for immunization: Secondary | ICD-10-CM | POA: Diagnosis not present

## 2018-03-04 DIAGNOSIS — N186 End stage renal disease: Secondary | ICD-10-CM | POA: Diagnosis not present

## 2018-03-04 DIAGNOSIS — Z992 Dependence on renal dialysis: Secondary | ICD-10-CM | POA: Diagnosis not present

## 2018-03-04 DIAGNOSIS — Z23 Encounter for immunization: Secondary | ICD-10-CM | POA: Diagnosis not present

## 2018-03-05 DIAGNOSIS — Z23 Encounter for immunization: Secondary | ICD-10-CM | POA: Diagnosis not present

## 2018-03-05 DIAGNOSIS — Z992 Dependence on renal dialysis: Secondary | ICD-10-CM | POA: Diagnosis not present

## 2018-03-05 DIAGNOSIS — N186 End stage renal disease: Secondary | ICD-10-CM | POA: Diagnosis not present

## 2018-03-06 DIAGNOSIS — N186 End stage renal disease: Secondary | ICD-10-CM | POA: Diagnosis not present

## 2018-03-06 DIAGNOSIS — Z992 Dependence on renal dialysis: Secondary | ICD-10-CM | POA: Diagnosis not present

## 2018-03-06 DIAGNOSIS — Z23 Encounter for immunization: Secondary | ICD-10-CM | POA: Diagnosis not present

## 2018-03-07 DIAGNOSIS — Z23 Encounter for immunization: Secondary | ICD-10-CM | POA: Diagnosis not present

## 2018-03-07 DIAGNOSIS — N186 End stage renal disease: Secondary | ICD-10-CM | POA: Diagnosis not present

## 2018-03-07 DIAGNOSIS — Z992 Dependence on renal dialysis: Secondary | ICD-10-CM | POA: Diagnosis not present

## 2018-03-08 DIAGNOSIS — Z992 Dependence on renal dialysis: Secondary | ICD-10-CM | POA: Diagnosis not present

## 2018-03-08 DIAGNOSIS — N186 End stage renal disease: Secondary | ICD-10-CM | POA: Diagnosis not present

## 2018-03-08 DIAGNOSIS — Z23 Encounter for immunization: Secondary | ICD-10-CM | POA: Diagnosis not present

## 2018-03-09 DIAGNOSIS — Z79899 Other long term (current) drug therapy: Secondary | ICD-10-CM | POA: Diagnosis not present

## 2018-03-09 DIAGNOSIS — N186 End stage renal disease: Secondary | ICD-10-CM | POA: Diagnosis not present

## 2018-03-09 DIAGNOSIS — D0919 Carcinoma in situ of other urinary organs: Secondary | ICD-10-CM | POA: Diagnosis not present

## 2018-03-09 DIAGNOSIS — Z880 Allergy status to penicillin: Secondary | ICD-10-CM | POA: Diagnosis not present

## 2018-03-09 DIAGNOSIS — D4959 Neoplasm of unspecified behavior of other genitourinary organ: Secondary | ICD-10-CM | POA: Diagnosis not present

## 2018-03-09 DIAGNOSIS — Z23 Encounter for immunization: Secondary | ICD-10-CM | POA: Diagnosis not present

## 2018-03-09 DIAGNOSIS — Z87891 Personal history of nicotine dependence: Secondary | ICD-10-CM | POA: Diagnosis not present

## 2018-03-09 DIAGNOSIS — Z992 Dependence on renal dialysis: Secondary | ICD-10-CM | POA: Diagnosis not present

## 2018-03-09 DIAGNOSIS — I120 Hypertensive chronic kidney disease with stage 5 chronic kidney disease or end stage renal disease: Secondary | ICD-10-CM | POA: Diagnosis not present

## 2018-03-09 DIAGNOSIS — N289 Disorder of kidney and ureter, unspecified: Secondary | ICD-10-CM | POA: Diagnosis not present

## 2018-03-10 DIAGNOSIS — N186 End stage renal disease: Secondary | ICD-10-CM | POA: Diagnosis not present

## 2018-03-10 DIAGNOSIS — Z992 Dependence on renal dialysis: Secondary | ICD-10-CM | POA: Diagnosis not present

## 2018-03-10 DIAGNOSIS — Z23 Encounter for immunization: Secondary | ICD-10-CM | POA: Diagnosis not present

## 2018-03-11 DIAGNOSIS — Z23 Encounter for immunization: Secondary | ICD-10-CM | POA: Diagnosis not present

## 2018-03-11 DIAGNOSIS — N186 End stage renal disease: Secondary | ICD-10-CM | POA: Diagnosis not present

## 2018-03-11 DIAGNOSIS — Z992 Dependence on renal dialysis: Secondary | ICD-10-CM | POA: Diagnosis not present

## 2018-03-12 DIAGNOSIS — N186 End stage renal disease: Secondary | ICD-10-CM | POA: Diagnosis not present

## 2018-03-12 DIAGNOSIS — Z23 Encounter for immunization: Secondary | ICD-10-CM | POA: Diagnosis not present

## 2018-03-12 DIAGNOSIS — Z992 Dependence on renal dialysis: Secondary | ICD-10-CM | POA: Diagnosis not present

## 2018-03-13 DIAGNOSIS — Z23 Encounter for immunization: Secondary | ICD-10-CM | POA: Diagnosis not present

## 2018-03-13 DIAGNOSIS — N186 End stage renal disease: Secondary | ICD-10-CM | POA: Diagnosis not present

## 2018-03-13 DIAGNOSIS — Z992 Dependence on renal dialysis: Secondary | ICD-10-CM | POA: Diagnosis not present

## 2018-03-14 DIAGNOSIS — Z23 Encounter for immunization: Secondary | ICD-10-CM | POA: Diagnosis not present

## 2018-03-14 DIAGNOSIS — Z992 Dependence on renal dialysis: Secondary | ICD-10-CM | POA: Diagnosis not present

## 2018-03-14 DIAGNOSIS — N186 End stage renal disease: Secondary | ICD-10-CM | POA: Diagnosis not present

## 2018-03-15 DIAGNOSIS — Z23 Encounter for immunization: Secondary | ICD-10-CM | POA: Diagnosis not present

## 2018-03-15 DIAGNOSIS — N186 End stage renal disease: Secondary | ICD-10-CM | POA: Diagnosis not present

## 2018-03-15 DIAGNOSIS — Z992 Dependence on renal dialysis: Secondary | ICD-10-CM | POA: Diagnosis not present

## 2018-03-16 DIAGNOSIS — N186 End stage renal disease: Secondary | ICD-10-CM | POA: Diagnosis not present

## 2018-03-16 DIAGNOSIS — Z992 Dependence on renal dialysis: Secondary | ICD-10-CM | POA: Diagnosis not present

## 2018-03-16 DIAGNOSIS — Z23 Encounter for immunization: Secondary | ICD-10-CM | POA: Diagnosis not present

## 2018-03-17 DIAGNOSIS — N186 End stage renal disease: Secondary | ICD-10-CM | POA: Diagnosis not present

## 2018-03-17 DIAGNOSIS — Z992 Dependence on renal dialysis: Secondary | ICD-10-CM | POA: Diagnosis not present

## 2018-03-17 DIAGNOSIS — Z23 Encounter for immunization: Secondary | ICD-10-CM | POA: Diagnosis not present

## 2018-03-18 DIAGNOSIS — Z23 Encounter for immunization: Secondary | ICD-10-CM | POA: Diagnosis not present

## 2018-03-18 DIAGNOSIS — N186 End stage renal disease: Secondary | ICD-10-CM | POA: Diagnosis not present

## 2018-03-18 DIAGNOSIS — Z992 Dependence on renal dialysis: Secondary | ICD-10-CM | POA: Diagnosis not present

## 2018-03-19 DIAGNOSIS — Z992 Dependence on renal dialysis: Secondary | ICD-10-CM | POA: Diagnosis not present

## 2018-03-19 DIAGNOSIS — Z23 Encounter for immunization: Secondary | ICD-10-CM | POA: Diagnosis not present

## 2018-03-19 DIAGNOSIS — N186 End stage renal disease: Secondary | ICD-10-CM | POA: Diagnosis not present

## 2018-03-21 DIAGNOSIS — Z23 Encounter for immunization: Secondary | ICD-10-CM | POA: Diagnosis not present

## 2018-03-21 DIAGNOSIS — Z992 Dependence on renal dialysis: Secondary | ICD-10-CM | POA: Diagnosis not present

## 2018-03-21 DIAGNOSIS — N186 End stage renal disease: Secondary | ICD-10-CM | POA: Diagnosis not present

## 2018-03-22 DIAGNOSIS — Z992 Dependence on renal dialysis: Secondary | ICD-10-CM | POA: Diagnosis not present

## 2018-03-22 DIAGNOSIS — N186 End stage renal disease: Secondary | ICD-10-CM | POA: Diagnosis not present

## 2018-03-23 DIAGNOSIS — N186 End stage renal disease: Secondary | ICD-10-CM | POA: Diagnosis not present

## 2018-03-23 DIAGNOSIS — Z992 Dependence on renal dialysis: Secondary | ICD-10-CM | POA: Diagnosis not present

## 2018-03-24 DIAGNOSIS — N186 End stage renal disease: Secondary | ICD-10-CM | POA: Diagnosis not present

## 2018-03-24 DIAGNOSIS — Z992 Dependence on renal dialysis: Secondary | ICD-10-CM | POA: Diagnosis not present

## 2018-03-25 DIAGNOSIS — Z992 Dependence on renal dialysis: Secondary | ICD-10-CM | POA: Diagnosis not present

## 2018-03-25 DIAGNOSIS — N186 End stage renal disease: Secondary | ICD-10-CM | POA: Diagnosis not present

## 2018-03-26 DIAGNOSIS — Z992 Dependence on renal dialysis: Secondary | ICD-10-CM | POA: Diagnosis not present

## 2018-03-26 DIAGNOSIS — N186 End stage renal disease: Secondary | ICD-10-CM | POA: Diagnosis not present

## 2018-03-27 DIAGNOSIS — Z992 Dependence on renal dialysis: Secondary | ICD-10-CM | POA: Diagnosis not present

## 2018-03-27 DIAGNOSIS — N186 End stage renal disease: Secondary | ICD-10-CM | POA: Diagnosis not present

## 2018-03-28 DIAGNOSIS — Z992 Dependence on renal dialysis: Secondary | ICD-10-CM | POA: Diagnosis not present

## 2018-03-28 DIAGNOSIS — N186 End stage renal disease: Secondary | ICD-10-CM | POA: Diagnosis not present

## 2018-03-29 DIAGNOSIS — Z992 Dependence on renal dialysis: Secondary | ICD-10-CM | POA: Diagnosis not present

## 2018-03-29 DIAGNOSIS — N186 End stage renal disease: Secondary | ICD-10-CM | POA: Diagnosis not present

## 2018-03-30 DIAGNOSIS — Z992 Dependence on renal dialysis: Secondary | ICD-10-CM | POA: Diagnosis not present

## 2018-03-30 DIAGNOSIS — N186 End stage renal disease: Secondary | ICD-10-CM | POA: Diagnosis not present

## 2018-03-31 DIAGNOSIS — N186 End stage renal disease: Secondary | ICD-10-CM | POA: Diagnosis not present

## 2018-03-31 DIAGNOSIS — Z992 Dependence on renal dialysis: Secondary | ICD-10-CM | POA: Diagnosis not present

## 2018-04-01 DIAGNOSIS — Z992 Dependence on renal dialysis: Secondary | ICD-10-CM | POA: Diagnosis not present

## 2018-04-01 DIAGNOSIS — N186 End stage renal disease: Secondary | ICD-10-CM | POA: Diagnosis not present

## 2018-04-02 DIAGNOSIS — Z992 Dependence on renal dialysis: Secondary | ICD-10-CM | POA: Diagnosis not present

## 2018-04-02 DIAGNOSIS — N186 End stage renal disease: Secondary | ICD-10-CM | POA: Diagnosis not present

## 2018-04-03 DIAGNOSIS — N186 End stage renal disease: Secondary | ICD-10-CM | POA: Diagnosis not present

## 2018-04-03 DIAGNOSIS — Z992 Dependence on renal dialysis: Secondary | ICD-10-CM | POA: Diagnosis not present

## 2018-04-04 DIAGNOSIS — Z992 Dependence on renal dialysis: Secondary | ICD-10-CM | POA: Diagnosis not present

## 2018-04-04 DIAGNOSIS — N186 End stage renal disease: Secondary | ICD-10-CM | POA: Diagnosis not present

## 2018-04-05 DIAGNOSIS — N186 End stage renal disease: Secondary | ICD-10-CM | POA: Diagnosis not present

## 2018-04-05 DIAGNOSIS — Z992 Dependence on renal dialysis: Secondary | ICD-10-CM | POA: Diagnosis not present

## 2018-04-06 DIAGNOSIS — N186 End stage renal disease: Secondary | ICD-10-CM | POA: Diagnosis not present

## 2018-04-06 DIAGNOSIS — Z992 Dependence on renal dialysis: Secondary | ICD-10-CM | POA: Diagnosis not present

## 2018-04-07 DIAGNOSIS — Z992 Dependence on renal dialysis: Secondary | ICD-10-CM | POA: Diagnosis not present

## 2018-04-07 DIAGNOSIS — N186 End stage renal disease: Secondary | ICD-10-CM | POA: Diagnosis not present

## 2018-04-08 DIAGNOSIS — Z992 Dependence on renal dialysis: Secondary | ICD-10-CM | POA: Diagnosis not present

## 2018-04-08 DIAGNOSIS — N186 End stage renal disease: Secondary | ICD-10-CM | POA: Diagnosis not present

## 2018-04-09 DIAGNOSIS — Z466 Encounter for fitting and adjustment of urinary device: Secondary | ICD-10-CM | POA: Diagnosis not present

## 2018-04-09 DIAGNOSIS — Z992 Dependence on renal dialysis: Secondary | ICD-10-CM | POA: Diagnosis not present

## 2018-04-09 DIAGNOSIS — N186 End stage renal disease: Secondary | ICD-10-CM | POA: Diagnosis not present

## 2018-04-09 DIAGNOSIS — N289 Disorder of kidney and ureter, unspecified: Secondary | ICD-10-CM | POA: Diagnosis not present

## 2018-04-10 DIAGNOSIS — N186 End stage renal disease: Secondary | ICD-10-CM | POA: Diagnosis not present

## 2018-04-10 DIAGNOSIS — Z992 Dependence on renal dialysis: Secondary | ICD-10-CM | POA: Diagnosis not present

## 2018-04-11 DIAGNOSIS — Z992 Dependence on renal dialysis: Secondary | ICD-10-CM | POA: Diagnosis not present

## 2018-04-11 DIAGNOSIS — N186 End stage renal disease: Secondary | ICD-10-CM | POA: Diagnosis not present

## 2018-04-12 DIAGNOSIS — N186 End stage renal disease: Secondary | ICD-10-CM | POA: Diagnosis not present

## 2018-04-12 DIAGNOSIS — Z992 Dependence on renal dialysis: Secondary | ICD-10-CM | POA: Diagnosis not present

## 2018-04-13 DIAGNOSIS — N186 End stage renal disease: Secondary | ICD-10-CM | POA: Diagnosis not present

## 2018-04-13 DIAGNOSIS — Z992 Dependence on renal dialysis: Secondary | ICD-10-CM | POA: Diagnosis not present

## 2018-04-14 DIAGNOSIS — N186 End stage renal disease: Secondary | ICD-10-CM | POA: Diagnosis not present

## 2018-04-14 DIAGNOSIS — Z992 Dependence on renal dialysis: Secondary | ICD-10-CM | POA: Diagnosis not present

## 2018-04-15 DIAGNOSIS — Z992 Dependence on renal dialysis: Secondary | ICD-10-CM | POA: Diagnosis not present

## 2018-04-15 DIAGNOSIS — N186 End stage renal disease: Secondary | ICD-10-CM | POA: Diagnosis not present

## 2018-04-16 DIAGNOSIS — Z992 Dependence on renal dialysis: Secondary | ICD-10-CM | POA: Diagnosis not present

## 2018-04-16 DIAGNOSIS — N186 End stage renal disease: Secondary | ICD-10-CM | POA: Diagnosis not present

## 2018-04-17 DIAGNOSIS — Z992 Dependence on renal dialysis: Secondary | ICD-10-CM | POA: Diagnosis not present

## 2018-04-17 DIAGNOSIS — N186 End stage renal disease: Secondary | ICD-10-CM | POA: Diagnosis not present

## 2018-04-18 DIAGNOSIS — Z992 Dependence on renal dialysis: Secondary | ICD-10-CM | POA: Diagnosis not present

## 2018-04-18 DIAGNOSIS — N186 End stage renal disease: Secondary | ICD-10-CM | POA: Diagnosis not present

## 2018-04-19 DIAGNOSIS — Z992 Dependence on renal dialysis: Secondary | ICD-10-CM | POA: Diagnosis not present

## 2018-04-19 DIAGNOSIS — N186 End stage renal disease: Secondary | ICD-10-CM | POA: Diagnosis not present

## 2018-04-20 DIAGNOSIS — N186 End stage renal disease: Secondary | ICD-10-CM | POA: Diagnosis not present

## 2018-04-20 DIAGNOSIS — Z992 Dependence on renal dialysis: Secondary | ICD-10-CM | POA: Diagnosis not present

## 2018-04-21 DIAGNOSIS — Z992 Dependence on renal dialysis: Secondary | ICD-10-CM | POA: Diagnosis not present

## 2018-04-21 DIAGNOSIS — N186 End stage renal disease: Secondary | ICD-10-CM | POA: Diagnosis not present

## 2018-04-22 DIAGNOSIS — Z992 Dependence on renal dialysis: Secondary | ICD-10-CM | POA: Diagnosis not present

## 2018-04-22 DIAGNOSIS — N186 End stage renal disease: Secondary | ICD-10-CM | POA: Diagnosis not present

## 2018-04-23 DIAGNOSIS — N186 End stage renal disease: Secondary | ICD-10-CM | POA: Diagnosis not present

## 2018-04-23 DIAGNOSIS — Z992 Dependence on renal dialysis: Secondary | ICD-10-CM | POA: Diagnosis not present

## 2018-04-24 DIAGNOSIS — Z992 Dependence on renal dialysis: Secondary | ICD-10-CM | POA: Diagnosis not present

## 2018-04-24 DIAGNOSIS — N186 End stage renal disease: Secondary | ICD-10-CM | POA: Diagnosis not present

## 2018-04-25 DIAGNOSIS — N186 End stage renal disease: Secondary | ICD-10-CM | POA: Diagnosis not present

## 2018-04-25 DIAGNOSIS — Z992 Dependence on renal dialysis: Secondary | ICD-10-CM | POA: Diagnosis not present

## 2018-04-26 DIAGNOSIS — Z992 Dependence on renal dialysis: Secondary | ICD-10-CM | POA: Diagnosis not present

## 2018-04-26 DIAGNOSIS — N186 End stage renal disease: Secondary | ICD-10-CM | POA: Diagnosis not present

## 2018-04-27 DIAGNOSIS — N186 End stage renal disease: Secondary | ICD-10-CM | POA: Diagnosis not present

## 2018-04-27 DIAGNOSIS — Z992 Dependence on renal dialysis: Secondary | ICD-10-CM | POA: Diagnosis not present

## 2018-04-28 DIAGNOSIS — Z992 Dependence on renal dialysis: Secondary | ICD-10-CM | POA: Diagnosis not present

## 2018-04-28 DIAGNOSIS — N186 End stage renal disease: Secondary | ICD-10-CM | POA: Diagnosis not present

## 2018-04-29 DIAGNOSIS — N186 End stage renal disease: Secondary | ICD-10-CM | POA: Diagnosis not present

## 2018-04-29 DIAGNOSIS — Z992 Dependence on renal dialysis: Secondary | ICD-10-CM | POA: Diagnosis not present

## 2018-04-30 DIAGNOSIS — Z992 Dependence on renal dialysis: Secondary | ICD-10-CM | POA: Diagnosis not present

## 2018-04-30 DIAGNOSIS — N186 End stage renal disease: Secondary | ICD-10-CM | POA: Diagnosis not present

## 2018-05-01 DIAGNOSIS — N186 End stage renal disease: Secondary | ICD-10-CM | POA: Diagnosis not present

## 2018-05-01 DIAGNOSIS — Z992 Dependence on renal dialysis: Secondary | ICD-10-CM | POA: Diagnosis not present

## 2018-05-02 DIAGNOSIS — N186 End stage renal disease: Secondary | ICD-10-CM | POA: Diagnosis not present

## 2018-05-02 DIAGNOSIS — Z992 Dependence on renal dialysis: Secondary | ICD-10-CM | POA: Diagnosis not present

## 2018-05-03 DIAGNOSIS — N186 End stage renal disease: Secondary | ICD-10-CM | POA: Diagnosis not present

## 2018-05-03 DIAGNOSIS — Z992 Dependence on renal dialysis: Secondary | ICD-10-CM | POA: Diagnosis not present

## 2018-05-04 DIAGNOSIS — N186 End stage renal disease: Secondary | ICD-10-CM | POA: Diagnosis not present

## 2018-05-04 DIAGNOSIS — Z992 Dependence on renal dialysis: Secondary | ICD-10-CM | POA: Diagnosis not present

## 2018-05-05 DIAGNOSIS — N186 End stage renal disease: Secondary | ICD-10-CM | POA: Diagnosis not present

## 2018-05-05 DIAGNOSIS — Z992 Dependence on renal dialysis: Secondary | ICD-10-CM | POA: Diagnosis not present

## 2018-05-06 DIAGNOSIS — Z992 Dependence on renal dialysis: Secondary | ICD-10-CM | POA: Diagnosis not present

## 2018-05-06 DIAGNOSIS — N186 End stage renal disease: Secondary | ICD-10-CM | POA: Diagnosis not present

## 2018-05-07 DIAGNOSIS — Z992 Dependence on renal dialysis: Secondary | ICD-10-CM | POA: Diagnosis not present

## 2018-05-07 DIAGNOSIS — N186 End stage renal disease: Secondary | ICD-10-CM | POA: Diagnosis not present

## 2018-05-08 DIAGNOSIS — Z992 Dependence on renal dialysis: Secondary | ICD-10-CM | POA: Diagnosis not present

## 2018-05-08 DIAGNOSIS — N186 End stage renal disease: Secondary | ICD-10-CM | POA: Diagnosis not present

## 2018-05-09 DIAGNOSIS — N186 End stage renal disease: Secondary | ICD-10-CM | POA: Diagnosis not present

## 2018-05-09 DIAGNOSIS — Z992 Dependence on renal dialysis: Secondary | ICD-10-CM | POA: Diagnosis not present

## 2018-05-10 DIAGNOSIS — Z992 Dependence on renal dialysis: Secondary | ICD-10-CM | POA: Diagnosis not present

## 2018-05-10 DIAGNOSIS — N186 End stage renal disease: Secondary | ICD-10-CM | POA: Diagnosis not present

## 2018-05-11 DIAGNOSIS — Z992 Dependence on renal dialysis: Secondary | ICD-10-CM | POA: Diagnosis not present

## 2018-05-11 DIAGNOSIS — N186 End stage renal disease: Secondary | ICD-10-CM | POA: Diagnosis not present

## 2018-05-12 DIAGNOSIS — N186 End stage renal disease: Secondary | ICD-10-CM | POA: Diagnosis not present

## 2018-05-12 DIAGNOSIS — Z992 Dependence on renal dialysis: Secondary | ICD-10-CM | POA: Diagnosis not present

## 2018-05-13 DIAGNOSIS — Z992 Dependence on renal dialysis: Secondary | ICD-10-CM | POA: Diagnosis not present

## 2018-05-13 DIAGNOSIS — N186 End stage renal disease: Secondary | ICD-10-CM | POA: Diagnosis not present

## 2018-05-14 DIAGNOSIS — N186 End stage renal disease: Secondary | ICD-10-CM | POA: Diagnosis not present

## 2018-05-14 DIAGNOSIS — Z992 Dependence on renal dialysis: Secondary | ICD-10-CM | POA: Diagnosis not present

## 2018-05-15 DIAGNOSIS — Z992 Dependence on renal dialysis: Secondary | ICD-10-CM | POA: Diagnosis not present

## 2018-05-15 DIAGNOSIS — N186 End stage renal disease: Secondary | ICD-10-CM | POA: Diagnosis not present

## 2018-05-16 DIAGNOSIS — Z992 Dependence on renal dialysis: Secondary | ICD-10-CM | POA: Diagnosis not present

## 2018-05-16 DIAGNOSIS — N186 End stage renal disease: Secondary | ICD-10-CM | POA: Diagnosis not present

## 2018-05-17 DIAGNOSIS — Z992 Dependence on renal dialysis: Secondary | ICD-10-CM | POA: Diagnosis not present

## 2018-05-17 DIAGNOSIS — N186 End stage renal disease: Secondary | ICD-10-CM | POA: Diagnosis not present

## 2018-05-18 DIAGNOSIS — Z992 Dependence on renal dialysis: Secondary | ICD-10-CM | POA: Diagnosis not present

## 2018-05-18 DIAGNOSIS — N186 End stage renal disease: Secondary | ICD-10-CM | POA: Diagnosis not present

## 2018-05-19 DIAGNOSIS — Z992 Dependence on renal dialysis: Secondary | ICD-10-CM | POA: Diagnosis not present

## 2018-05-19 DIAGNOSIS — N186 End stage renal disease: Secondary | ICD-10-CM | POA: Diagnosis not present

## 2018-05-20 DIAGNOSIS — Z992 Dependence on renal dialysis: Secondary | ICD-10-CM | POA: Diagnosis not present

## 2018-05-20 DIAGNOSIS — N186 End stage renal disease: Secondary | ICD-10-CM | POA: Diagnosis not present

## 2018-05-21 DIAGNOSIS — N186 End stage renal disease: Secondary | ICD-10-CM | POA: Diagnosis not present

## 2018-05-21 DIAGNOSIS — Z992 Dependence on renal dialysis: Secondary | ICD-10-CM | POA: Diagnosis not present

## 2018-05-22 DIAGNOSIS — Z992 Dependence on renal dialysis: Secondary | ICD-10-CM | POA: Diagnosis not present

## 2018-05-22 DIAGNOSIS — N186 End stage renal disease: Secondary | ICD-10-CM | POA: Diagnosis not present

## 2018-05-23 DIAGNOSIS — N186 End stage renal disease: Secondary | ICD-10-CM | POA: Diagnosis not present

## 2018-05-23 DIAGNOSIS — Z992 Dependence on renal dialysis: Secondary | ICD-10-CM | POA: Diagnosis not present

## 2018-05-24 DIAGNOSIS — N186 End stage renal disease: Secondary | ICD-10-CM | POA: Diagnosis not present

## 2018-05-24 DIAGNOSIS — Z992 Dependence on renal dialysis: Secondary | ICD-10-CM | POA: Diagnosis not present

## 2018-05-25 DIAGNOSIS — Z992 Dependence on renal dialysis: Secondary | ICD-10-CM | POA: Diagnosis not present

## 2018-05-25 DIAGNOSIS — N186 End stage renal disease: Secondary | ICD-10-CM | POA: Diagnosis not present

## 2018-05-26 DIAGNOSIS — N186 End stage renal disease: Secondary | ICD-10-CM | POA: Diagnosis not present

## 2018-05-26 DIAGNOSIS — Z992 Dependence on renal dialysis: Secondary | ICD-10-CM | POA: Diagnosis not present

## 2018-05-27 DIAGNOSIS — N186 End stage renal disease: Secondary | ICD-10-CM | POA: Diagnosis not present

## 2018-05-27 DIAGNOSIS — Z992 Dependence on renal dialysis: Secondary | ICD-10-CM | POA: Diagnosis not present

## 2018-05-28 DIAGNOSIS — N186 End stage renal disease: Secondary | ICD-10-CM | POA: Diagnosis not present

## 2018-05-28 DIAGNOSIS — Z992 Dependence on renal dialysis: Secondary | ICD-10-CM | POA: Diagnosis not present

## 2018-05-29 DIAGNOSIS — N186 End stage renal disease: Secondary | ICD-10-CM | POA: Diagnosis not present

## 2018-05-29 DIAGNOSIS — Z992 Dependence on renal dialysis: Secondary | ICD-10-CM | POA: Diagnosis not present

## 2018-05-30 DIAGNOSIS — Z992 Dependence on renal dialysis: Secondary | ICD-10-CM | POA: Diagnosis not present

## 2018-05-30 DIAGNOSIS — N186 End stage renal disease: Secondary | ICD-10-CM | POA: Diagnosis not present

## 2018-05-31 DIAGNOSIS — N186 End stage renal disease: Secondary | ICD-10-CM | POA: Diagnosis not present

## 2018-05-31 DIAGNOSIS — Z992 Dependence on renal dialysis: Secondary | ICD-10-CM | POA: Diagnosis not present

## 2018-06-02 DIAGNOSIS — N186 End stage renal disease: Secondary | ICD-10-CM | POA: Diagnosis not present

## 2018-06-02 DIAGNOSIS — Z992 Dependence on renal dialysis: Secondary | ICD-10-CM | POA: Diagnosis not present

## 2018-06-03 DIAGNOSIS — N186 End stage renal disease: Secondary | ICD-10-CM | POA: Diagnosis not present

## 2018-06-03 DIAGNOSIS — Z992 Dependence on renal dialysis: Secondary | ICD-10-CM | POA: Diagnosis not present

## 2018-06-03 IMAGING — CR DG ABDOMEN 1V
2 series · 2 of 2 positions shown · non-contrast
Comparison: Radiographs August 09, 2014.

CLINICAL DATA: Peritoneal dialysis catheter dysfunction.

EXAM:
ABDOMEN - 1 VIEW

[abdomen kub (1 of 2)]
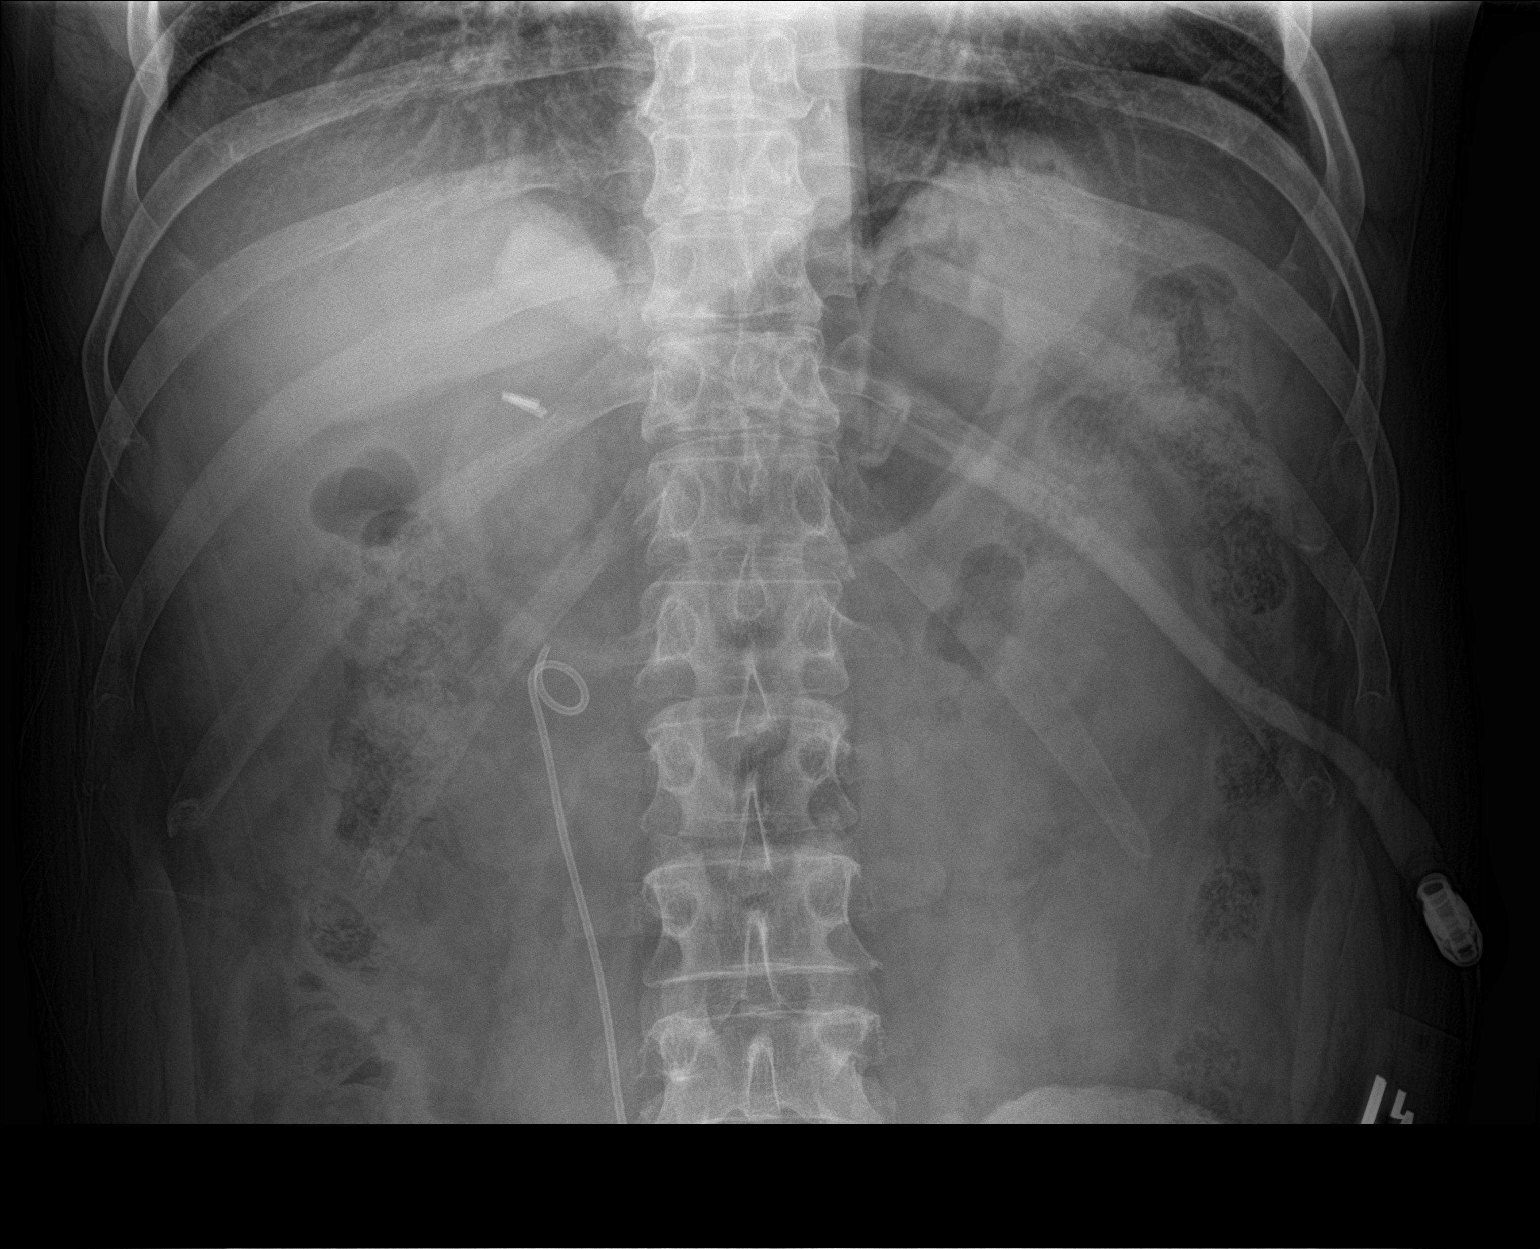

[abdomen kub (2 of 2)]
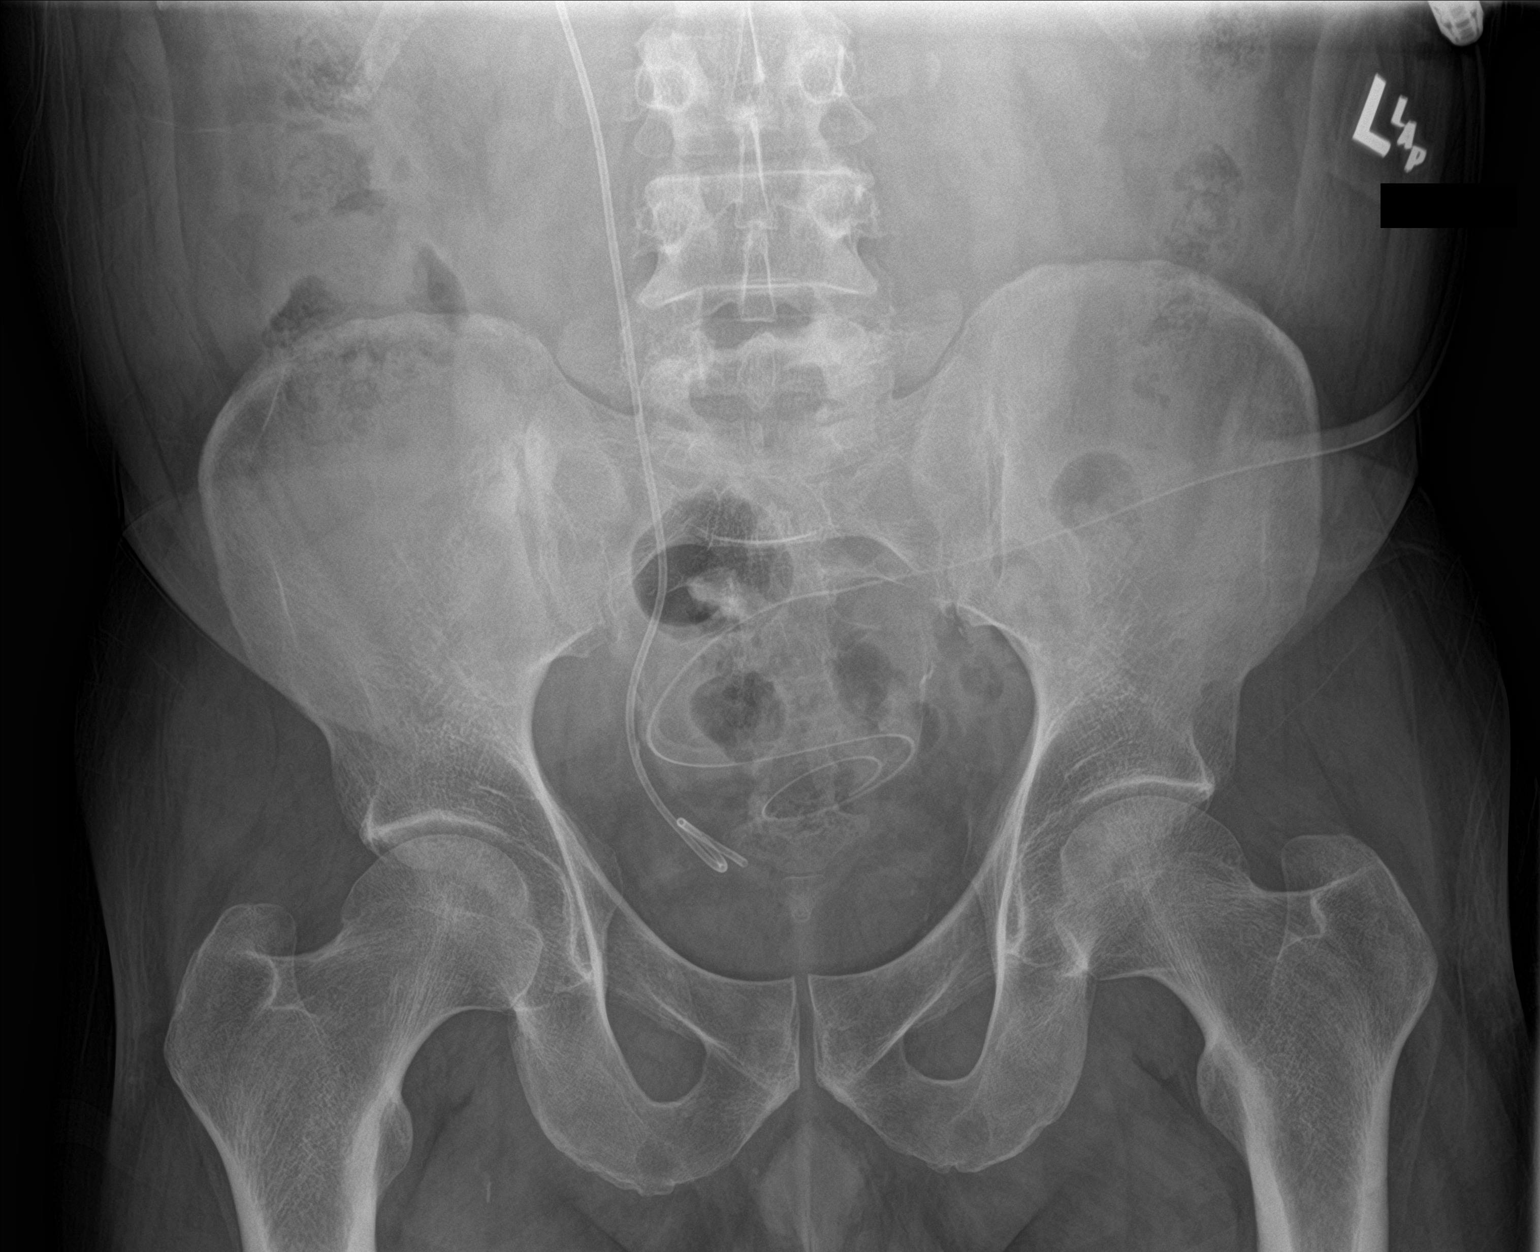

[2 of 2 positions shown; findings below may reference images not displayed]

FINDINGS: The bowel gas pattern is normal. Status post cholecystectomy. Right
ureteral stent is noted. Distal loop of peritoneal dialysis catheter
is seen in the pelvis.
IMPRESSION: Distal loop of peritoneal dialysis catheter is seen in the pelvis.
Right ureteral stent is noted. There is no evidence of bowel
obstruction or ileus.

## 2018-06-04 DIAGNOSIS — N186 End stage renal disease: Secondary | ICD-10-CM | POA: Diagnosis not present

## 2018-06-04 DIAGNOSIS — Z992 Dependence on renal dialysis: Secondary | ICD-10-CM | POA: Diagnosis not present

## 2018-06-05 DIAGNOSIS — N186 End stage renal disease: Secondary | ICD-10-CM | POA: Diagnosis not present

## 2018-06-05 DIAGNOSIS — Z992 Dependence on renal dialysis: Secondary | ICD-10-CM | POA: Diagnosis not present

## 2018-06-06 DIAGNOSIS — Z992 Dependence on renal dialysis: Secondary | ICD-10-CM | POA: Diagnosis not present

## 2018-06-06 DIAGNOSIS — N186 End stage renal disease: Secondary | ICD-10-CM | POA: Diagnosis not present

## 2018-06-07 DIAGNOSIS — N186 End stage renal disease: Secondary | ICD-10-CM | POA: Diagnosis not present

## 2018-06-07 DIAGNOSIS — Z992 Dependence on renal dialysis: Secondary | ICD-10-CM | POA: Diagnosis not present

## 2018-06-08 DIAGNOSIS — N186 End stage renal disease: Secondary | ICD-10-CM | POA: Diagnosis not present

## 2018-06-08 DIAGNOSIS — Z992 Dependence on renal dialysis: Secondary | ICD-10-CM | POA: Diagnosis not present

## 2018-06-10 DIAGNOSIS — Z992 Dependence on renal dialysis: Secondary | ICD-10-CM | POA: Diagnosis not present

## 2018-06-10 DIAGNOSIS — N186 End stage renal disease: Secondary | ICD-10-CM | POA: Diagnosis not present

## 2018-06-11 DIAGNOSIS — N186 End stage renal disease: Secondary | ICD-10-CM | POA: Diagnosis not present

## 2018-06-11 DIAGNOSIS — Z992 Dependence on renal dialysis: Secondary | ICD-10-CM | POA: Diagnosis not present

## 2018-06-12 DIAGNOSIS — N186 End stage renal disease: Secondary | ICD-10-CM | POA: Diagnosis not present

## 2018-06-12 DIAGNOSIS — Z992 Dependence on renal dialysis: Secondary | ICD-10-CM | POA: Diagnosis not present

## 2018-06-13 DIAGNOSIS — N186 End stage renal disease: Secondary | ICD-10-CM | POA: Diagnosis not present

## 2018-06-13 DIAGNOSIS — Z992 Dependence on renal dialysis: Secondary | ICD-10-CM | POA: Diagnosis not present

## 2018-06-14 DIAGNOSIS — N186 End stage renal disease: Secondary | ICD-10-CM | POA: Diagnosis not present

## 2018-06-14 DIAGNOSIS — Z992 Dependence on renal dialysis: Secondary | ICD-10-CM | POA: Diagnosis not present

## 2018-06-15 DIAGNOSIS — N186 End stage renal disease: Secondary | ICD-10-CM | POA: Diagnosis not present

## 2018-06-15 DIAGNOSIS — Z992 Dependence on renal dialysis: Secondary | ICD-10-CM | POA: Diagnosis not present

## 2018-06-16 DIAGNOSIS — Z992 Dependence on renal dialysis: Secondary | ICD-10-CM | POA: Diagnosis not present

## 2018-06-16 DIAGNOSIS — N186 End stage renal disease: Secondary | ICD-10-CM | POA: Diagnosis not present

## 2018-06-17 DIAGNOSIS — Z992 Dependence on renal dialysis: Secondary | ICD-10-CM | POA: Diagnosis not present

## 2018-06-17 DIAGNOSIS — N186 End stage renal disease: Secondary | ICD-10-CM | POA: Diagnosis not present

## 2018-06-18 DIAGNOSIS — Z992 Dependence on renal dialysis: Secondary | ICD-10-CM | POA: Diagnosis not present

## 2018-06-18 DIAGNOSIS — N186 End stage renal disease: Secondary | ICD-10-CM | POA: Diagnosis not present

## 2018-06-20 DIAGNOSIS — Z992 Dependence on renal dialysis: Secondary | ICD-10-CM | POA: Diagnosis not present

## 2018-06-20 DIAGNOSIS — N186 End stage renal disease: Secondary | ICD-10-CM | POA: Diagnosis not present

## 2018-06-21 DIAGNOSIS — Z992 Dependence on renal dialysis: Secondary | ICD-10-CM | POA: Diagnosis not present

## 2018-06-21 DIAGNOSIS — N186 End stage renal disease: Secondary | ICD-10-CM | POA: Diagnosis not present

## 2018-06-22 DIAGNOSIS — Z992 Dependence on renal dialysis: Secondary | ICD-10-CM | POA: Diagnosis not present

## 2018-06-22 DIAGNOSIS — N186 End stage renal disease: Secondary | ICD-10-CM | POA: Diagnosis not present

## 2018-06-23 DIAGNOSIS — N186 End stage renal disease: Secondary | ICD-10-CM | POA: Diagnosis not present

## 2018-06-23 DIAGNOSIS — Z992 Dependence on renal dialysis: Secondary | ICD-10-CM | POA: Diagnosis not present

## 2018-06-24 DIAGNOSIS — N186 End stage renal disease: Secondary | ICD-10-CM | POA: Diagnosis not present

## 2018-06-24 DIAGNOSIS — Z992 Dependence on renal dialysis: Secondary | ICD-10-CM | POA: Diagnosis not present

## 2018-06-25 DIAGNOSIS — N186 End stage renal disease: Secondary | ICD-10-CM | POA: Diagnosis not present

## 2018-06-25 DIAGNOSIS — Z992 Dependence on renal dialysis: Secondary | ICD-10-CM | POA: Diagnosis not present

## 2018-06-27 DIAGNOSIS — Z992 Dependence on renal dialysis: Secondary | ICD-10-CM | POA: Diagnosis not present

## 2018-06-27 DIAGNOSIS — N186 End stage renal disease: Secondary | ICD-10-CM | POA: Diagnosis not present

## 2018-06-28 DIAGNOSIS — N186 End stage renal disease: Secondary | ICD-10-CM | POA: Diagnosis not present

## 2018-06-28 DIAGNOSIS — Z992 Dependence on renal dialysis: Secondary | ICD-10-CM | POA: Diagnosis not present

## 2018-06-29 DIAGNOSIS — Z992 Dependence on renal dialysis: Secondary | ICD-10-CM | POA: Diagnosis not present

## 2018-06-29 DIAGNOSIS — N186 End stage renal disease: Secondary | ICD-10-CM | POA: Diagnosis not present

## 2018-06-30 DIAGNOSIS — Z992 Dependence on renal dialysis: Secondary | ICD-10-CM | POA: Diagnosis not present

## 2018-06-30 DIAGNOSIS — N186 End stage renal disease: Secondary | ICD-10-CM | POA: Diagnosis not present

## 2018-07-01 DIAGNOSIS — N186 End stage renal disease: Secondary | ICD-10-CM | POA: Diagnosis not present

## 2018-07-01 DIAGNOSIS — Z992 Dependence on renal dialysis: Secondary | ICD-10-CM | POA: Diagnosis not present

## 2018-07-02 DIAGNOSIS — Z992 Dependence on renal dialysis: Secondary | ICD-10-CM | POA: Diagnosis not present

## 2018-07-02 DIAGNOSIS — N186 End stage renal disease: Secondary | ICD-10-CM | POA: Diagnosis not present

## 2018-07-04 DIAGNOSIS — N186 End stage renal disease: Secondary | ICD-10-CM | POA: Diagnosis not present

## 2018-07-04 DIAGNOSIS — Z992 Dependence on renal dialysis: Secondary | ICD-10-CM | POA: Diagnosis not present

## 2018-07-05 DIAGNOSIS — Z992 Dependence on renal dialysis: Secondary | ICD-10-CM | POA: Diagnosis not present

## 2018-07-05 DIAGNOSIS — N186 End stage renal disease: Secondary | ICD-10-CM | POA: Diagnosis not present

## 2018-07-06 DIAGNOSIS — Z992 Dependence on renal dialysis: Secondary | ICD-10-CM | POA: Diagnosis not present

## 2018-07-06 DIAGNOSIS — N186 End stage renal disease: Secondary | ICD-10-CM | POA: Diagnosis not present

## 2018-07-07 DIAGNOSIS — N186 End stage renal disease: Secondary | ICD-10-CM | POA: Diagnosis not present

## 2018-07-07 DIAGNOSIS — Z992 Dependence on renal dialysis: Secondary | ICD-10-CM | POA: Diagnosis not present

## 2018-07-08 DIAGNOSIS — N186 End stage renal disease: Secondary | ICD-10-CM | POA: Diagnosis not present

## 2018-07-08 DIAGNOSIS — Z992 Dependence on renal dialysis: Secondary | ICD-10-CM | POA: Diagnosis not present

## 2018-07-09 DIAGNOSIS — N186 End stage renal disease: Secondary | ICD-10-CM | POA: Diagnosis not present

## 2018-07-09 DIAGNOSIS — Z992 Dependence on renal dialysis: Secondary | ICD-10-CM | POA: Diagnosis not present

## 2018-07-11 DIAGNOSIS — Z992 Dependence on renal dialysis: Secondary | ICD-10-CM | POA: Diagnosis not present

## 2018-07-11 DIAGNOSIS — N186 End stage renal disease: Secondary | ICD-10-CM | POA: Diagnosis not present

## 2018-07-12 DIAGNOSIS — N186 End stage renal disease: Secondary | ICD-10-CM | POA: Diagnosis not present

## 2018-07-12 DIAGNOSIS — Z992 Dependence on renal dialysis: Secondary | ICD-10-CM | POA: Diagnosis not present

## 2018-07-13 DIAGNOSIS — Z992 Dependence on renal dialysis: Secondary | ICD-10-CM | POA: Diagnosis not present

## 2018-07-13 DIAGNOSIS — N186 End stage renal disease: Secondary | ICD-10-CM | POA: Diagnosis not present

## 2018-07-14 DIAGNOSIS — N186 End stage renal disease: Secondary | ICD-10-CM | POA: Diagnosis not present

## 2018-07-14 DIAGNOSIS — Z992 Dependence on renal dialysis: Secondary | ICD-10-CM | POA: Diagnosis not present

## 2018-07-15 DIAGNOSIS — R809 Proteinuria, unspecified: Secondary | ICD-10-CM | POA: Diagnosis not present

## 2018-07-15 DIAGNOSIS — E872 Acidosis: Secondary | ICD-10-CM | POA: Diagnosis not present

## 2018-07-15 DIAGNOSIS — Z992 Dependence on renal dialysis: Secondary | ICD-10-CM | POA: Diagnosis not present

## 2018-07-15 DIAGNOSIS — N185 Chronic kidney disease, stage 5: Secondary | ICD-10-CM | POA: Diagnosis not present

## 2018-07-15 DIAGNOSIS — N022 Recurrent and persistent hematuria with diffuse membranous glomerulonephritis: Secondary | ICD-10-CM | POA: Diagnosis not present

## 2018-07-15 DIAGNOSIS — N186 End stage renal disease: Secondary | ICD-10-CM | POA: Diagnosis not present

## 2018-07-16 DIAGNOSIS — N186 End stage renal disease: Secondary | ICD-10-CM | POA: Diagnosis not present

## 2018-07-16 DIAGNOSIS — Z992 Dependence on renal dialysis: Secondary | ICD-10-CM | POA: Diagnosis not present

## 2018-07-18 DIAGNOSIS — Z992 Dependence on renal dialysis: Secondary | ICD-10-CM | POA: Diagnosis not present

## 2018-07-18 DIAGNOSIS — N186 End stage renal disease: Secondary | ICD-10-CM | POA: Diagnosis not present

## 2018-07-19 DIAGNOSIS — N186 End stage renal disease: Secondary | ICD-10-CM | POA: Diagnosis not present

## 2018-07-19 DIAGNOSIS — Z992 Dependence on renal dialysis: Secondary | ICD-10-CM | POA: Diagnosis not present

## 2018-07-20 DIAGNOSIS — Z992 Dependence on renal dialysis: Secondary | ICD-10-CM | POA: Diagnosis not present

## 2018-07-20 DIAGNOSIS — N186 End stage renal disease: Secondary | ICD-10-CM | POA: Diagnosis not present

## 2018-07-21 DIAGNOSIS — Z992 Dependence on renal dialysis: Secondary | ICD-10-CM | POA: Diagnosis not present

## 2018-07-21 DIAGNOSIS — N186 End stage renal disease: Secondary | ICD-10-CM | POA: Diagnosis not present

## 2018-07-22 DIAGNOSIS — Z992 Dependence on renal dialysis: Secondary | ICD-10-CM | POA: Diagnosis not present

## 2018-07-22 DIAGNOSIS — N186 End stage renal disease: Secondary | ICD-10-CM | POA: Diagnosis not present

## 2018-07-23 DIAGNOSIS — Z992 Dependence on renal dialysis: Secondary | ICD-10-CM | POA: Diagnosis not present

## 2018-07-23 DIAGNOSIS — N186 End stage renal disease: Secondary | ICD-10-CM | POA: Diagnosis not present

## 2018-07-25 DIAGNOSIS — N186 End stage renal disease: Secondary | ICD-10-CM | POA: Diagnosis not present

## 2018-07-25 DIAGNOSIS — Z992 Dependence on renal dialysis: Secondary | ICD-10-CM | POA: Diagnosis not present

## 2018-07-26 DIAGNOSIS — Z992 Dependence on renal dialysis: Secondary | ICD-10-CM | POA: Diagnosis not present

## 2018-07-26 DIAGNOSIS — N186 End stage renal disease: Secondary | ICD-10-CM | POA: Diagnosis not present

## 2018-07-27 DIAGNOSIS — Z992 Dependence on renal dialysis: Secondary | ICD-10-CM | POA: Diagnosis not present

## 2018-07-27 DIAGNOSIS — N186 End stage renal disease: Secondary | ICD-10-CM | POA: Diagnosis not present

## 2018-07-28 DIAGNOSIS — Z992 Dependence on renal dialysis: Secondary | ICD-10-CM | POA: Diagnosis not present

## 2018-07-28 DIAGNOSIS — N186 End stage renal disease: Secondary | ICD-10-CM | POA: Diagnosis not present

## 2018-07-29 DIAGNOSIS — Z992 Dependence on renal dialysis: Secondary | ICD-10-CM | POA: Diagnosis not present

## 2018-07-29 DIAGNOSIS — N186 End stage renal disease: Secondary | ICD-10-CM | POA: Diagnosis not present

## 2018-07-30 DIAGNOSIS — N186 End stage renal disease: Secondary | ICD-10-CM | POA: Diagnosis not present

## 2018-07-30 DIAGNOSIS — Z992 Dependence on renal dialysis: Secondary | ICD-10-CM | POA: Diagnosis not present

## 2018-08-01 DIAGNOSIS — Z992 Dependence on renal dialysis: Secondary | ICD-10-CM | POA: Diagnosis not present

## 2018-08-01 DIAGNOSIS — N186 End stage renal disease: Secondary | ICD-10-CM | POA: Diagnosis not present

## 2018-08-02 DIAGNOSIS — N186 End stage renal disease: Secondary | ICD-10-CM | POA: Diagnosis not present

## 2018-08-02 DIAGNOSIS — Z992 Dependence on renal dialysis: Secondary | ICD-10-CM | POA: Diagnosis not present

## 2018-08-03 DIAGNOSIS — N186 End stage renal disease: Secondary | ICD-10-CM | POA: Diagnosis not present

## 2018-08-03 DIAGNOSIS — Z992 Dependence on renal dialysis: Secondary | ICD-10-CM | POA: Diagnosis not present

## 2018-08-04 DIAGNOSIS — Z992 Dependence on renal dialysis: Secondary | ICD-10-CM | POA: Diagnosis not present

## 2018-08-04 DIAGNOSIS — N186 End stage renal disease: Secondary | ICD-10-CM | POA: Diagnosis not present

## 2018-08-05 DIAGNOSIS — Z992 Dependence on renal dialysis: Secondary | ICD-10-CM | POA: Diagnosis not present

## 2018-08-05 DIAGNOSIS — N186 End stage renal disease: Secondary | ICD-10-CM | POA: Diagnosis not present

## 2018-08-06 DIAGNOSIS — Z992 Dependence on renal dialysis: Secondary | ICD-10-CM | POA: Diagnosis not present

## 2018-08-06 DIAGNOSIS — N186 End stage renal disease: Secondary | ICD-10-CM | POA: Diagnosis not present

## 2018-08-08 DIAGNOSIS — Z992 Dependence on renal dialysis: Secondary | ICD-10-CM | POA: Diagnosis not present

## 2018-08-08 DIAGNOSIS — N186 End stage renal disease: Secondary | ICD-10-CM | POA: Diagnosis not present

## 2018-08-09 DIAGNOSIS — Z992 Dependence on renal dialysis: Secondary | ICD-10-CM | POA: Diagnosis not present

## 2018-08-09 DIAGNOSIS — N186 End stage renal disease: Secondary | ICD-10-CM | POA: Diagnosis not present

## 2018-08-09 DIAGNOSIS — N390 Urinary tract infection, site not specified: Secondary | ICD-10-CM | POA: Diagnosis not present

## 2018-08-10 DIAGNOSIS — N186 End stage renal disease: Secondary | ICD-10-CM | POA: Diagnosis not present

## 2018-08-10 DIAGNOSIS — Z992 Dependence on renal dialysis: Secondary | ICD-10-CM | POA: Diagnosis not present

## 2018-08-11 DIAGNOSIS — Z992 Dependence on renal dialysis: Secondary | ICD-10-CM | POA: Diagnosis not present

## 2018-08-11 DIAGNOSIS — N186 End stage renal disease: Secondary | ICD-10-CM | POA: Diagnosis not present

## 2018-08-12 DIAGNOSIS — N186 End stage renal disease: Secondary | ICD-10-CM | POA: Diagnosis not present

## 2018-08-12 DIAGNOSIS — Z992 Dependence on renal dialysis: Secondary | ICD-10-CM | POA: Diagnosis not present

## 2018-08-13 DIAGNOSIS — N186 End stage renal disease: Secondary | ICD-10-CM | POA: Diagnosis not present

## 2018-08-13 DIAGNOSIS — Z992 Dependence on renal dialysis: Secondary | ICD-10-CM | POA: Diagnosis not present

## 2018-08-15 DIAGNOSIS — Z992 Dependence on renal dialysis: Secondary | ICD-10-CM | POA: Diagnosis not present

## 2018-08-15 DIAGNOSIS — N186 End stage renal disease: Secondary | ICD-10-CM | POA: Diagnosis not present

## 2018-08-16 DIAGNOSIS — N186 End stage renal disease: Secondary | ICD-10-CM | POA: Diagnosis not present

## 2018-08-16 DIAGNOSIS — Z992 Dependence on renal dialysis: Secondary | ICD-10-CM | POA: Diagnosis not present

## 2018-08-17 DIAGNOSIS — N186 End stage renal disease: Secondary | ICD-10-CM | POA: Diagnosis not present

## 2018-08-17 DIAGNOSIS — Z992 Dependence on renal dialysis: Secondary | ICD-10-CM | POA: Diagnosis not present

## 2018-08-18 DIAGNOSIS — Z992 Dependence on renal dialysis: Secondary | ICD-10-CM | POA: Diagnosis not present

## 2018-08-18 DIAGNOSIS — N186 End stage renal disease: Secondary | ICD-10-CM | POA: Diagnosis not present

## 2018-08-19 DIAGNOSIS — N186 End stage renal disease: Secondary | ICD-10-CM | POA: Diagnosis not present

## 2018-08-19 DIAGNOSIS — Z992 Dependence on renal dialysis: Secondary | ICD-10-CM | POA: Diagnosis not present

## 2018-08-20 DIAGNOSIS — N186 End stage renal disease: Secondary | ICD-10-CM | POA: Diagnosis not present

## 2018-08-20 DIAGNOSIS — Z992 Dependence on renal dialysis: Secondary | ICD-10-CM | POA: Diagnosis not present

## 2018-08-22 DIAGNOSIS — N186 End stage renal disease: Secondary | ICD-10-CM | POA: Diagnosis not present

## 2018-08-22 DIAGNOSIS — Z992 Dependence on renal dialysis: Secondary | ICD-10-CM | POA: Diagnosis not present

## 2018-08-23 DIAGNOSIS — Z992 Dependence on renal dialysis: Secondary | ICD-10-CM | POA: Diagnosis not present

## 2018-08-23 DIAGNOSIS — N186 End stage renal disease: Secondary | ICD-10-CM | POA: Diagnosis not present

## 2018-08-24 DIAGNOSIS — Z992 Dependence on renal dialysis: Secondary | ICD-10-CM | POA: Diagnosis not present

## 2018-08-24 DIAGNOSIS — N186 End stage renal disease: Secondary | ICD-10-CM | POA: Diagnosis not present

## 2018-08-25 DIAGNOSIS — Z992 Dependence on renal dialysis: Secondary | ICD-10-CM | POA: Diagnosis not present

## 2018-08-25 DIAGNOSIS — N186 End stage renal disease: Secondary | ICD-10-CM | POA: Diagnosis not present

## 2018-08-26 DIAGNOSIS — Z992 Dependence on renal dialysis: Secondary | ICD-10-CM | POA: Diagnosis not present

## 2018-08-26 DIAGNOSIS — N186 End stage renal disease: Secondary | ICD-10-CM | POA: Diagnosis not present

## 2018-08-27 DIAGNOSIS — N186 End stage renal disease: Secondary | ICD-10-CM | POA: Diagnosis not present

## 2018-08-27 DIAGNOSIS — Z992 Dependence on renal dialysis: Secondary | ICD-10-CM | POA: Diagnosis not present

## 2018-08-29 DIAGNOSIS — Z992 Dependence on renal dialysis: Secondary | ICD-10-CM | POA: Diagnosis not present

## 2018-08-29 DIAGNOSIS — N186 End stage renal disease: Secondary | ICD-10-CM | POA: Diagnosis not present

## 2018-08-30 DIAGNOSIS — Z880 Allergy status to penicillin: Secondary | ICD-10-CM | POA: Diagnosis not present

## 2018-08-30 DIAGNOSIS — Z87891 Personal history of nicotine dependence: Secondary | ICD-10-CM | POA: Diagnosis not present

## 2018-08-30 DIAGNOSIS — D4959 Neoplasm of unspecified behavior of other genitourinary organ: Secondary | ICD-10-CM | POA: Diagnosis not present

## 2018-08-30 DIAGNOSIS — D4121 Neoplasm of uncertain behavior of right ureter: Secondary | ICD-10-CM | POA: Diagnosis not present

## 2018-08-30 DIAGNOSIS — N186 End stage renal disease: Secondary | ICD-10-CM | POA: Diagnosis not present

## 2018-08-30 DIAGNOSIS — I120 Hypertensive chronic kidney disease with stage 5 chronic kidney disease or end stage renal disease: Secondary | ICD-10-CM | POA: Diagnosis not present

## 2018-08-30 DIAGNOSIS — Z79899 Other long term (current) drug therapy: Secondary | ICD-10-CM | POA: Diagnosis not present

## 2018-08-30 DIAGNOSIS — N289 Disorder of kidney and ureter, unspecified: Secondary | ICD-10-CM | POA: Diagnosis not present

## 2018-08-30 DIAGNOSIS — Z992 Dependence on renal dialysis: Secondary | ICD-10-CM | POA: Diagnosis not present

## 2018-08-31 DIAGNOSIS — Z992 Dependence on renal dialysis: Secondary | ICD-10-CM | POA: Diagnosis not present

## 2018-08-31 DIAGNOSIS — N186 End stage renal disease: Secondary | ICD-10-CM | POA: Diagnosis not present

## 2018-09-01 DIAGNOSIS — N186 End stage renal disease: Secondary | ICD-10-CM | POA: Diagnosis not present

## 2018-09-01 DIAGNOSIS — Z992 Dependence on renal dialysis: Secondary | ICD-10-CM | POA: Diagnosis not present

## 2018-09-02 DIAGNOSIS — Z992 Dependence on renal dialysis: Secondary | ICD-10-CM | POA: Diagnosis not present

## 2018-09-02 DIAGNOSIS — N186 End stage renal disease: Secondary | ICD-10-CM | POA: Diagnosis not present

## 2018-09-03 DIAGNOSIS — Z992 Dependence on renal dialysis: Secondary | ICD-10-CM | POA: Diagnosis not present

## 2018-09-03 DIAGNOSIS — Z466 Encounter for fitting and adjustment of urinary device: Secondary | ICD-10-CM | POA: Diagnosis not present

## 2018-09-03 DIAGNOSIS — N186 End stage renal disease: Secondary | ICD-10-CM | POA: Diagnosis not present

## 2018-09-05 DIAGNOSIS — Z992 Dependence on renal dialysis: Secondary | ICD-10-CM | POA: Diagnosis not present

## 2018-09-05 DIAGNOSIS — N186 End stage renal disease: Secondary | ICD-10-CM | POA: Diagnosis not present

## 2018-09-06 DIAGNOSIS — Z992 Dependence on renal dialysis: Secondary | ICD-10-CM | POA: Diagnosis not present

## 2018-09-06 DIAGNOSIS — N186 End stage renal disease: Secondary | ICD-10-CM | POA: Diagnosis not present

## 2018-09-07 DIAGNOSIS — N186 End stage renal disease: Secondary | ICD-10-CM | POA: Diagnosis not present

## 2018-09-07 DIAGNOSIS — Z992 Dependence on renal dialysis: Secondary | ICD-10-CM | POA: Diagnosis not present

## 2018-09-08 DIAGNOSIS — N186 End stage renal disease: Secondary | ICD-10-CM | POA: Diagnosis not present

## 2018-09-08 DIAGNOSIS — Z992 Dependence on renal dialysis: Secondary | ICD-10-CM | POA: Diagnosis not present

## 2018-09-09 DIAGNOSIS — N186 End stage renal disease: Secondary | ICD-10-CM | POA: Diagnosis not present

## 2018-09-09 DIAGNOSIS — Z992 Dependence on renal dialysis: Secondary | ICD-10-CM | POA: Diagnosis not present

## 2018-09-10 DIAGNOSIS — Z992 Dependence on renal dialysis: Secondary | ICD-10-CM | POA: Diagnosis not present

## 2018-09-10 DIAGNOSIS — N186 End stage renal disease: Secondary | ICD-10-CM | POA: Diagnosis not present

## 2018-09-12 DIAGNOSIS — Z992 Dependence on renal dialysis: Secondary | ICD-10-CM | POA: Diagnosis not present

## 2018-09-12 DIAGNOSIS — N186 End stage renal disease: Secondary | ICD-10-CM | POA: Diagnosis not present

## 2018-09-13 DIAGNOSIS — N186 End stage renal disease: Secondary | ICD-10-CM | POA: Diagnosis not present

## 2018-09-13 DIAGNOSIS — Z992 Dependence on renal dialysis: Secondary | ICD-10-CM | POA: Diagnosis not present

## 2018-09-14 DIAGNOSIS — Z992 Dependence on renal dialysis: Secondary | ICD-10-CM | POA: Diagnosis not present

## 2018-09-14 DIAGNOSIS — N186 End stage renal disease: Secondary | ICD-10-CM | POA: Diagnosis not present

## 2018-09-15 DIAGNOSIS — Z992 Dependence on renal dialysis: Secondary | ICD-10-CM | POA: Diagnosis not present

## 2018-09-15 DIAGNOSIS — N186 End stage renal disease: Secondary | ICD-10-CM | POA: Diagnosis not present

## 2018-09-16 DIAGNOSIS — Z992 Dependence on renal dialysis: Secondary | ICD-10-CM | POA: Diagnosis not present

## 2018-09-16 DIAGNOSIS — N186 End stage renal disease: Secondary | ICD-10-CM | POA: Diagnosis not present

## 2018-09-17 DIAGNOSIS — Z992 Dependence on renal dialysis: Secondary | ICD-10-CM | POA: Diagnosis not present

## 2018-09-17 DIAGNOSIS — N186 End stage renal disease: Secondary | ICD-10-CM | POA: Diagnosis not present

## 2018-09-19 DIAGNOSIS — Z992 Dependence on renal dialysis: Secondary | ICD-10-CM | POA: Diagnosis not present

## 2018-09-19 DIAGNOSIS — N186 End stage renal disease: Secondary | ICD-10-CM | POA: Diagnosis not present

## 2018-09-20 DIAGNOSIS — Z992 Dependence on renal dialysis: Secondary | ICD-10-CM | POA: Diagnosis not present

## 2018-09-20 DIAGNOSIS — N186 End stage renal disease: Secondary | ICD-10-CM | POA: Diagnosis not present

## 2018-09-21 DIAGNOSIS — Z992 Dependence on renal dialysis: Secondary | ICD-10-CM | POA: Diagnosis not present

## 2018-09-21 DIAGNOSIS — N186 End stage renal disease: Secondary | ICD-10-CM | POA: Diagnosis not present

## 2018-09-22 DIAGNOSIS — Z992 Dependence on renal dialysis: Secondary | ICD-10-CM | POA: Diagnosis not present

## 2018-09-22 DIAGNOSIS — N186 End stage renal disease: Secondary | ICD-10-CM | POA: Diagnosis not present

## 2018-09-23 DIAGNOSIS — Z992 Dependence on renal dialysis: Secondary | ICD-10-CM | POA: Diagnosis not present

## 2018-09-23 DIAGNOSIS — N186 End stage renal disease: Secondary | ICD-10-CM | POA: Diagnosis not present

## 2018-09-24 DIAGNOSIS — Z992 Dependence on renal dialysis: Secondary | ICD-10-CM | POA: Diagnosis not present

## 2018-09-24 DIAGNOSIS — N186 End stage renal disease: Secondary | ICD-10-CM | POA: Diagnosis not present

## 2018-09-26 DIAGNOSIS — Z992 Dependence on renal dialysis: Secondary | ICD-10-CM | POA: Diagnosis not present

## 2018-09-26 DIAGNOSIS — N186 End stage renal disease: Secondary | ICD-10-CM | POA: Diagnosis not present

## 2018-09-27 DIAGNOSIS — N186 End stage renal disease: Secondary | ICD-10-CM | POA: Diagnosis not present

## 2018-09-27 DIAGNOSIS — Z992 Dependence on renal dialysis: Secondary | ICD-10-CM | POA: Diagnosis not present

## 2018-09-28 DIAGNOSIS — N186 End stage renal disease: Secondary | ICD-10-CM | POA: Diagnosis not present

## 2018-09-28 DIAGNOSIS — Z992 Dependence on renal dialysis: Secondary | ICD-10-CM | POA: Diagnosis not present

## 2018-09-29 DIAGNOSIS — N186 End stage renal disease: Secondary | ICD-10-CM | POA: Diagnosis not present

## 2018-09-29 DIAGNOSIS — Z992 Dependence on renal dialysis: Secondary | ICD-10-CM | POA: Diagnosis not present

## 2018-09-30 DIAGNOSIS — N186 End stage renal disease: Secondary | ICD-10-CM | POA: Diagnosis not present

## 2018-09-30 DIAGNOSIS — Z992 Dependence on renal dialysis: Secondary | ICD-10-CM | POA: Diagnosis not present

## 2018-10-01 DIAGNOSIS — N186 End stage renal disease: Secondary | ICD-10-CM | POA: Diagnosis not present

## 2018-10-01 DIAGNOSIS — Z992 Dependence on renal dialysis: Secondary | ICD-10-CM | POA: Diagnosis not present

## 2018-10-03 DIAGNOSIS — N186 End stage renal disease: Secondary | ICD-10-CM | POA: Diagnosis not present

## 2018-10-03 DIAGNOSIS — Z992 Dependence on renal dialysis: Secondary | ICD-10-CM | POA: Diagnosis not present

## 2018-10-04 DIAGNOSIS — N186 End stage renal disease: Secondary | ICD-10-CM | POA: Diagnosis not present

## 2018-10-04 DIAGNOSIS — Z992 Dependence on renal dialysis: Secondary | ICD-10-CM | POA: Diagnosis not present

## 2018-10-05 DIAGNOSIS — Z992 Dependence on renal dialysis: Secondary | ICD-10-CM | POA: Diagnosis not present

## 2018-10-05 DIAGNOSIS — N186 End stage renal disease: Secondary | ICD-10-CM | POA: Diagnosis not present

## 2018-10-06 DIAGNOSIS — N186 End stage renal disease: Secondary | ICD-10-CM | POA: Diagnosis not present

## 2018-10-06 DIAGNOSIS — Z992 Dependence on renal dialysis: Secondary | ICD-10-CM | POA: Diagnosis not present

## 2018-10-07 DIAGNOSIS — Z992 Dependence on renal dialysis: Secondary | ICD-10-CM | POA: Diagnosis not present

## 2018-10-07 DIAGNOSIS — N186 End stage renal disease: Secondary | ICD-10-CM | POA: Diagnosis not present

## 2018-10-08 DIAGNOSIS — N186 End stage renal disease: Secondary | ICD-10-CM | POA: Diagnosis not present

## 2018-10-08 DIAGNOSIS — Z992 Dependence on renal dialysis: Secondary | ICD-10-CM | POA: Diagnosis not present

## 2018-10-10 DIAGNOSIS — Z992 Dependence on renal dialysis: Secondary | ICD-10-CM | POA: Diagnosis not present

## 2018-10-10 DIAGNOSIS — N186 End stage renal disease: Secondary | ICD-10-CM | POA: Diagnosis not present

## 2018-10-11 DIAGNOSIS — N186 End stage renal disease: Secondary | ICD-10-CM | POA: Diagnosis not present

## 2018-10-11 DIAGNOSIS — Z992 Dependence on renal dialysis: Secondary | ICD-10-CM | POA: Diagnosis not present

## 2018-10-12 DIAGNOSIS — N186 End stage renal disease: Secondary | ICD-10-CM | POA: Diagnosis not present

## 2018-10-12 DIAGNOSIS — Z992 Dependence on renal dialysis: Secondary | ICD-10-CM | POA: Diagnosis not present

## 2018-10-13 DIAGNOSIS — N186 End stage renal disease: Secondary | ICD-10-CM | POA: Diagnosis not present

## 2018-10-13 DIAGNOSIS — Z992 Dependence on renal dialysis: Secondary | ICD-10-CM | POA: Diagnosis not present

## 2018-10-14 DIAGNOSIS — Z992 Dependence on renal dialysis: Secondary | ICD-10-CM | POA: Diagnosis not present

## 2018-10-14 DIAGNOSIS — N186 End stage renal disease: Secondary | ICD-10-CM | POA: Diagnosis not present

## 2018-10-15 DIAGNOSIS — Z992 Dependence on renal dialysis: Secondary | ICD-10-CM | POA: Diagnosis not present

## 2018-10-15 DIAGNOSIS — N186 End stage renal disease: Secondary | ICD-10-CM | POA: Diagnosis not present

## 2018-10-17 DIAGNOSIS — N186 End stage renal disease: Secondary | ICD-10-CM | POA: Diagnosis not present

## 2018-10-17 DIAGNOSIS — Z992 Dependence on renal dialysis: Secondary | ICD-10-CM | POA: Diagnosis not present

## 2018-10-18 DIAGNOSIS — Z992 Dependence on renal dialysis: Secondary | ICD-10-CM | POA: Diagnosis not present

## 2018-10-18 DIAGNOSIS — N186 End stage renal disease: Secondary | ICD-10-CM | POA: Diagnosis not present

## 2018-10-19 DIAGNOSIS — N186 End stage renal disease: Secondary | ICD-10-CM | POA: Diagnosis not present

## 2018-10-19 DIAGNOSIS — Z992 Dependence on renal dialysis: Secondary | ICD-10-CM | POA: Diagnosis not present

## 2018-10-20 DIAGNOSIS — N186 End stage renal disease: Secondary | ICD-10-CM | POA: Diagnosis not present

## 2018-10-20 DIAGNOSIS — Z992 Dependence on renal dialysis: Secondary | ICD-10-CM | POA: Diagnosis not present

## 2018-10-21 DIAGNOSIS — Z992 Dependence on renal dialysis: Secondary | ICD-10-CM | POA: Diagnosis not present

## 2018-10-21 DIAGNOSIS — N186 End stage renal disease: Secondary | ICD-10-CM | POA: Diagnosis not present

## 2018-10-22 DIAGNOSIS — Z992 Dependence on renal dialysis: Secondary | ICD-10-CM | POA: Diagnosis not present

## 2018-10-22 DIAGNOSIS — N186 End stage renal disease: Secondary | ICD-10-CM | POA: Diagnosis not present

## 2018-10-24 DIAGNOSIS — N186 End stage renal disease: Secondary | ICD-10-CM | POA: Diagnosis not present

## 2018-10-24 DIAGNOSIS — Z992 Dependence on renal dialysis: Secondary | ICD-10-CM | POA: Diagnosis not present

## 2018-10-25 DIAGNOSIS — N186 End stage renal disease: Secondary | ICD-10-CM | POA: Diagnosis not present

## 2018-10-25 DIAGNOSIS — Z992 Dependence on renal dialysis: Secondary | ICD-10-CM | POA: Diagnosis not present

## 2018-10-26 DIAGNOSIS — N186 End stage renal disease: Secondary | ICD-10-CM | POA: Diagnosis not present

## 2018-10-26 DIAGNOSIS — Z992 Dependence on renal dialysis: Secondary | ICD-10-CM | POA: Diagnosis not present

## 2018-10-27 DIAGNOSIS — Z992 Dependence on renal dialysis: Secondary | ICD-10-CM | POA: Diagnosis not present

## 2018-10-27 DIAGNOSIS — N186 End stage renal disease: Secondary | ICD-10-CM | POA: Diagnosis not present

## 2018-10-28 DIAGNOSIS — N186 End stage renal disease: Secondary | ICD-10-CM | POA: Diagnosis not present

## 2018-10-28 DIAGNOSIS — Z992 Dependence on renal dialysis: Secondary | ICD-10-CM | POA: Diagnosis not present

## 2018-10-29 DIAGNOSIS — N186 End stage renal disease: Secondary | ICD-10-CM | POA: Diagnosis not present

## 2018-10-29 DIAGNOSIS — Z992 Dependence on renal dialysis: Secondary | ICD-10-CM | POA: Diagnosis not present

## 2018-10-31 DIAGNOSIS — Z992 Dependence on renal dialysis: Secondary | ICD-10-CM | POA: Diagnosis not present

## 2018-10-31 DIAGNOSIS — N186 End stage renal disease: Secondary | ICD-10-CM | POA: Diagnosis not present

## 2018-11-01 DIAGNOSIS — N186 End stage renal disease: Secondary | ICD-10-CM | POA: Diagnosis not present

## 2018-11-01 DIAGNOSIS — Z992 Dependence on renal dialysis: Secondary | ICD-10-CM | POA: Diagnosis not present

## 2018-11-02 DIAGNOSIS — N186 End stage renal disease: Secondary | ICD-10-CM | POA: Diagnosis not present

## 2018-11-02 DIAGNOSIS — Z992 Dependence on renal dialysis: Secondary | ICD-10-CM | POA: Diagnosis not present

## 2018-11-03 DIAGNOSIS — N186 End stage renal disease: Secondary | ICD-10-CM | POA: Diagnosis not present

## 2018-11-03 DIAGNOSIS — Z992 Dependence on renal dialysis: Secondary | ICD-10-CM | POA: Diagnosis not present

## 2018-11-04 DIAGNOSIS — Z992 Dependence on renal dialysis: Secondary | ICD-10-CM | POA: Diagnosis not present

## 2018-11-04 DIAGNOSIS — N186 End stage renal disease: Secondary | ICD-10-CM | POA: Diagnosis not present

## 2018-11-05 DIAGNOSIS — Z992 Dependence on renal dialysis: Secondary | ICD-10-CM | POA: Diagnosis not present

## 2018-11-05 DIAGNOSIS — N186 End stage renal disease: Secondary | ICD-10-CM | POA: Diagnosis not present

## 2018-11-07 DIAGNOSIS — Z992 Dependence on renal dialysis: Secondary | ICD-10-CM | POA: Diagnosis not present

## 2018-11-07 DIAGNOSIS — N186 End stage renal disease: Secondary | ICD-10-CM | POA: Diagnosis not present

## 2018-11-08 DIAGNOSIS — N186 End stage renal disease: Secondary | ICD-10-CM | POA: Diagnosis not present

## 2018-11-08 DIAGNOSIS — Z992 Dependence on renal dialysis: Secondary | ICD-10-CM | POA: Diagnosis not present

## 2018-11-09 DIAGNOSIS — N186 End stage renal disease: Secondary | ICD-10-CM | POA: Diagnosis not present

## 2018-11-09 DIAGNOSIS — Z992 Dependence on renal dialysis: Secondary | ICD-10-CM | POA: Diagnosis not present

## 2018-11-12 DIAGNOSIS — Z992 Dependence on renal dialysis: Secondary | ICD-10-CM | POA: Diagnosis not present

## 2018-11-12 DIAGNOSIS — N186 End stage renal disease: Secondary | ICD-10-CM | POA: Diagnosis not present

## 2018-11-14 DIAGNOSIS — Z992 Dependence on renal dialysis: Secondary | ICD-10-CM | POA: Diagnosis not present

## 2018-11-14 DIAGNOSIS — N186 End stage renal disease: Secondary | ICD-10-CM | POA: Diagnosis not present

## 2018-11-15 DIAGNOSIS — N186 End stage renal disease: Secondary | ICD-10-CM | POA: Diagnosis not present

## 2018-11-15 DIAGNOSIS — Z992 Dependence on renal dialysis: Secondary | ICD-10-CM | POA: Diagnosis not present

## 2018-11-17 DIAGNOSIS — N186 End stage renal disease: Secondary | ICD-10-CM | POA: Diagnosis not present

## 2018-11-17 DIAGNOSIS — Z992 Dependence on renal dialysis: Secondary | ICD-10-CM | POA: Diagnosis not present

## 2018-11-18 DIAGNOSIS — Z992 Dependence on renal dialysis: Secondary | ICD-10-CM | POA: Diagnosis not present

## 2018-11-18 DIAGNOSIS — N186 End stage renal disease: Secondary | ICD-10-CM | POA: Diagnosis not present

## 2018-11-20 DIAGNOSIS — N186 End stage renal disease: Secondary | ICD-10-CM | POA: Diagnosis not present

## 2018-11-20 DIAGNOSIS — Z992 Dependence on renal dialysis: Secondary | ICD-10-CM | POA: Diagnosis not present

## 2018-11-21 DIAGNOSIS — Z992 Dependence on renal dialysis: Secondary | ICD-10-CM | POA: Diagnosis not present

## 2018-11-21 DIAGNOSIS — N186 End stage renal disease: Secondary | ICD-10-CM | POA: Diagnosis not present

## 2018-11-22 DIAGNOSIS — Z992 Dependence on renal dialysis: Secondary | ICD-10-CM | POA: Diagnosis not present

## 2018-11-22 DIAGNOSIS — N186 End stage renal disease: Secondary | ICD-10-CM | POA: Diagnosis not present

## 2018-11-23 DIAGNOSIS — N186 End stage renal disease: Secondary | ICD-10-CM | POA: Diagnosis not present

## 2018-11-23 DIAGNOSIS — Z992 Dependence on renal dialysis: Secondary | ICD-10-CM | POA: Diagnosis not present

## 2018-11-24 DIAGNOSIS — N186 End stage renal disease: Secondary | ICD-10-CM | POA: Diagnosis not present

## 2018-11-24 DIAGNOSIS — Z992 Dependence on renal dialysis: Secondary | ICD-10-CM | POA: Diagnosis not present

## 2018-11-25 DIAGNOSIS — N186 End stage renal disease: Secondary | ICD-10-CM | POA: Diagnosis not present

## 2018-11-25 DIAGNOSIS — Z992 Dependence on renal dialysis: Secondary | ICD-10-CM | POA: Diagnosis not present

## 2018-11-26 DIAGNOSIS — N186 End stage renal disease: Secondary | ICD-10-CM | POA: Diagnosis not present

## 2018-11-26 DIAGNOSIS — Z992 Dependence on renal dialysis: Secondary | ICD-10-CM | POA: Diagnosis not present

## 2018-11-28 DIAGNOSIS — Z992 Dependence on renal dialysis: Secondary | ICD-10-CM | POA: Diagnosis not present

## 2018-11-28 DIAGNOSIS — N186 End stage renal disease: Secondary | ICD-10-CM | POA: Diagnosis not present

## 2018-11-29 DIAGNOSIS — Z992 Dependence on renal dialysis: Secondary | ICD-10-CM | POA: Diagnosis not present

## 2018-11-29 DIAGNOSIS — N186 End stage renal disease: Secondary | ICD-10-CM | POA: Diagnosis not present

## 2018-11-30 DIAGNOSIS — N186 End stage renal disease: Secondary | ICD-10-CM | POA: Diagnosis not present

## 2018-11-30 DIAGNOSIS — Z992 Dependence on renal dialysis: Secondary | ICD-10-CM | POA: Diagnosis not present

## 2018-12-01 DIAGNOSIS — N186 End stage renal disease: Secondary | ICD-10-CM | POA: Diagnosis not present

## 2018-12-01 DIAGNOSIS — Z992 Dependence on renal dialysis: Secondary | ICD-10-CM | POA: Diagnosis not present

## 2018-12-02 DIAGNOSIS — Z992 Dependence on renal dialysis: Secondary | ICD-10-CM | POA: Diagnosis not present

## 2018-12-02 DIAGNOSIS — N186 End stage renal disease: Secondary | ICD-10-CM | POA: Diagnosis not present

## 2018-12-03 DIAGNOSIS — Z992 Dependence on renal dialysis: Secondary | ICD-10-CM | POA: Diagnosis not present

## 2018-12-03 DIAGNOSIS — N186 End stage renal disease: Secondary | ICD-10-CM | POA: Diagnosis not present

## 2018-12-05 DIAGNOSIS — N186 End stage renal disease: Secondary | ICD-10-CM | POA: Diagnosis not present

## 2018-12-05 DIAGNOSIS — Z992 Dependence on renal dialysis: Secondary | ICD-10-CM | POA: Diagnosis not present

## 2018-12-06 DIAGNOSIS — N186 End stage renal disease: Secondary | ICD-10-CM | POA: Diagnosis not present

## 2018-12-06 DIAGNOSIS — Z992 Dependence on renal dialysis: Secondary | ICD-10-CM | POA: Diagnosis not present

## 2018-12-07 DIAGNOSIS — Z992 Dependence on renal dialysis: Secondary | ICD-10-CM | POA: Diagnosis not present

## 2018-12-07 DIAGNOSIS — N186 End stage renal disease: Secondary | ICD-10-CM | POA: Diagnosis not present

## 2018-12-08 DIAGNOSIS — Z992 Dependence on renal dialysis: Secondary | ICD-10-CM | POA: Diagnosis not present

## 2018-12-08 DIAGNOSIS — N186 End stage renal disease: Secondary | ICD-10-CM | POA: Diagnosis not present

## 2018-12-09 DIAGNOSIS — Z992 Dependence on renal dialysis: Secondary | ICD-10-CM | POA: Diagnosis not present

## 2018-12-09 DIAGNOSIS — N186 End stage renal disease: Secondary | ICD-10-CM | POA: Diagnosis not present

## 2018-12-10 DIAGNOSIS — Z992 Dependence on renal dialysis: Secondary | ICD-10-CM | POA: Diagnosis not present

## 2018-12-10 DIAGNOSIS — N186 End stage renal disease: Secondary | ICD-10-CM | POA: Diagnosis not present

## 2018-12-12 DIAGNOSIS — Z992 Dependence on renal dialysis: Secondary | ICD-10-CM | POA: Diagnosis not present

## 2018-12-12 DIAGNOSIS — N186 End stage renal disease: Secondary | ICD-10-CM | POA: Diagnosis not present

## 2018-12-13 DIAGNOSIS — N186 End stage renal disease: Secondary | ICD-10-CM | POA: Diagnosis not present

## 2018-12-13 DIAGNOSIS — Z992 Dependence on renal dialysis: Secondary | ICD-10-CM | POA: Diagnosis not present

## 2018-12-14 ENCOUNTER — Emergency Department: Payer: BLUE CROSS/BLUE SHIELD

## 2018-12-14 ENCOUNTER — Emergency Department
Admission: EM | Admit: 2018-12-14 | Discharge: 2018-12-14 | Disposition: A | Discharge status: Home / Self Care | Payer: BLUE CROSS/BLUE SHIELD | Attending: Emergency Medicine | Admitting: Emergency Medicine

## 2018-12-14 ENCOUNTER — Encounter: Payer: Self-pay | Admitting: Emergency Medicine

## 2018-12-14 ENCOUNTER — Other Ambulatory Visit: Payer: Self-pay

## 2018-12-14 DIAGNOSIS — Z79899 Other long term (current) drug therapy: Secondary | ICD-10-CM | POA: Diagnosis not present

## 2018-12-14 DIAGNOSIS — R05 Cough: Secondary | ICD-10-CM | POA: Diagnosis not present

## 2018-12-14 DIAGNOSIS — N185 Chronic kidney disease, stage 5: Secondary | ICD-10-CM | POA: Diagnosis not present

## 2018-12-14 DIAGNOSIS — B349 Viral infection, unspecified: Secondary | ICD-10-CM | POA: Diagnosis not present

## 2018-12-14 DIAGNOSIS — R0602 Shortness of breath: Secondary | ICD-10-CM | POA: Insufficient documentation

## 2018-12-14 DIAGNOSIS — N186 End stage renal disease: Secondary | ICD-10-CM | POA: Diagnosis not present

## 2018-12-14 DIAGNOSIS — Z87891 Personal history of nicotine dependence: Secondary | ICD-10-CM | POA: Insufficient documentation

## 2018-12-14 DIAGNOSIS — Z992 Dependence on renal dialysis: Secondary | ICD-10-CM | POA: Diagnosis not present

## 2018-12-14 DIAGNOSIS — I120 Hypertensive chronic kidney disease with stage 5 chronic kidney disease or end stage renal disease: Secondary | ICD-10-CM | POA: Diagnosis not present

## 2018-12-14 DIAGNOSIS — R509 Fever, unspecified: Secondary | ICD-10-CM | POA: Diagnosis not present

## 2018-12-14 LAB — COMPREHENSIVE METABOLIC PANEL
ALBUMIN: 3.5 g/dL (ref 3.5–5.0)
ALK PHOS: 59 U/L (ref 38–126)
ALT: 36 U/L (ref 0–44)
AST: 50 U/L — AB (ref 15–41)
Anion gap: 7 (ref 5–15)
BILIRUBIN TOTAL: 1 mg/dL (ref 0.3–1.2)
BUN: 34 mg/dL — AB (ref 6–20)
CHLORIDE: 108 mmol/L (ref 98–111)
CO2: 19 mmol/L — AB (ref 22–32)
Calcium: 8.4 mg/dL — ABNORMAL LOW (ref 8.9–10.3)
Creatinine, Ser: 3.57 mg/dL — ABNORMAL HIGH (ref 0.61–1.24)
GFR calc Af Amer: 21 mL/min — ABNORMAL LOW (ref >60)
GFR calc non Af Amer: 18 mL/min — ABNORMAL LOW (ref >60)
GLUCOSE: 99 mg/dL (ref 70–99)
Potassium: 3.6 mmol/L (ref 3.5–5.1)
Sodium: 134 mmol/L — ABNORMAL LOW (ref 135–145)
Total Protein: 6.1 g/dL — ABNORMAL LOW (ref 6.5–8.1)

## 2018-12-14 LAB — CBC WITH DIFFERENTIAL/PLATELET
Abs Immature Granulocytes: 0.02 10*3/uL (ref 0.00–0.07)
BASOS ABS: 0.1 10*3/uL (ref 0.0–0.1)
Basophils Relative: 1 %
EOS PCT: 1 %
Eosinophils Absolute: 0.1 10*3/uL (ref 0.0–0.5)
HEMATOCRIT: 41.4 % (ref 39.0–52.0)
HEMOGLOBIN: 13.8 g/dL (ref 13.0–17.0)
IMMATURE GRANULOCYTES: 0 %
LYMPHS ABS: 1.9 10*3/uL (ref 0.7–4.0)
LYMPHS PCT: 26 %
MCH: 33.4 pg (ref 26.0–34.0)
MCHC: 33.3 g/dL (ref 30.0–36.0)
MCV: 100.2 fL — ABNORMAL HIGH (ref 80.0–100.0)
MONOS PCT: 8 %
Monocytes Absolute: 0.6 10*3/uL (ref 0.1–1.0)
Neutro Abs: 4.7 10*3/uL (ref 1.7–7.7)
Neutrophils Relative %: 64 %
Platelets: 243 10*3/uL (ref 150–400)
RBC: 4.13 MIL/uL — ABNORMAL LOW (ref 4.22–5.81)
RDW: 13 % (ref 11.5–15.5)
WBC: 7.4 10*3/uL (ref 4.0–10.5)
nRBC: 0 % (ref 0.0–0.2)

## 2018-12-14 LAB — URINALYSIS, COMPLETE (UACMP) WITH MICROSCOPIC
BACTERIA UA: NONE SEEN
Bilirubin Urine: NEGATIVE
Glucose, UA: 150 mg/dL — AB
KETONES UR: 5 mg/dL — AB
Leukocytes,Ua: NEGATIVE
Nitrite: NEGATIVE
Specific Gravity, Urine: 1.017 (ref 1.005–1.030)
pH: 7 (ref 5.0–8.0)

## 2018-12-14 LAB — INFLUENZA PANEL BY PCR (TYPE A & B)
INFLAPCR: NEGATIVE
INFLBPCR: NEGATIVE

## 2018-12-14 MED ORDER — SODIUM CHLORIDE 0.9 % IV SOLN
1.0000 g | Freq: Once | INTRAVENOUS | Status: AC
  Administered 2018-12-14: 1 g via INTRAVENOUS
  Filled 2018-12-14: qty 10

## 2018-12-14 MED ORDER — SODIUM CHLORIDE 0.9 % IV BOLUS
500.0000 mL | Freq: Once | INTRAVENOUS | Status: AC
  Administered 2018-12-14: 500 mL via INTRAVENOUS

## 2018-12-14 MED ORDER — ONDANSETRON HCL 4 MG PO TABS: 4.0000 mg | ORAL_TABLET | Freq: Three times a day (TID) | ORAL | 0 refills | Status: DC | PRN

## 2018-12-14 MED ORDER — ONDANSETRON HCL 4 MG/2ML IJ SOLN
4.0000 mg | Freq: Once | INTRAMUSCULAR | Status: AC
  Administered 2018-12-14: 4 mg via INTRAVENOUS
  Filled 2018-12-14: qty 2

## 2018-12-14 MED ORDER — HYDRALAZINE HCL 50 MG PO TABS: 50.0000 mg | ORAL_TABLET | Freq: Once | ORAL | Status: DC

## 2018-12-14 NOTE — ED Triage Notes (Signed)
Pt presents to ED via POV, sent by Dr. Elwyn Lade office for Temp >100 in office, cough, congestion, and nausea/emesis x2 days. Pt is on peritoneal dialysis, states no issues with dialysis treatments. BP elevated, pt states he took BP meds this morning but threw them back up.

## 2018-12-14 NOTE — Discharge Instructions (Signed)
You likely have a viral illness nonetheless we have given you antibiotics to cover you for the next 24 hours, if you have worsening symptoms including fever that is quite elevated despite Tylenol, persistent vomiting, worsening diarrhea, any pain in your abdomen, leading, trouble breathing or you feel worse in any way return to the emergency department.  Follow closely with your nephrologist tomorrow they will follow your blood cultures.  They will decide if you need further antibiotics.  Please tell your doctor that we talk to Dr. Juleen China and he will know what to do.  In addition, there is always a chance that you have been exposed to are suffering from the coronavirus.  For this reason we asked you to stay away especially from anybody who is likely to become significantly ill from this.  We ask you to exercise at least social distancing and you may consider quarantine for 2 weeks as well from the time of onset of symptoms given the input of your primary care doctor.  Consider also calling the coronavirus hotline at the following number [phone].

## 2018-12-14 NOTE — ED Provider Notes (Addendum)
[facility] Emergency Department Provider Note  ____________________________________________   I have reviewed the triage vital signs and the nursing notes. Where available I have reviewed prior notes and, if possible and indicated, outside hospital notes.    HISTORY  Chief Complaint Cough and Fever    HPI Larry [name] is a 53 y.o. male  With a history of peritoneal dialysis, though still make urine and.  Hypertension, chronic anemia presents today complaining of fever which he did not realize he had till he got his doctor's office.  It was listed as "over 100".  Patient has had no issues with his dialysis no abdominal pain, he has had no cloudy drainage etc.  Patient threw up his blood pressure medications this morning and he is not having a headache or chest pain.  He does have rhinorrhea, congestion, cough, loose stools, and vomiting.  This is been going on for 2 days.  No known sick contacts with coronavirus.  No recent travel no dysuria no urinary frequency.    Past Medical History:  Diagnosis Date  . Anemia   . Chronic kidney disease   . Hypertension   . Nephrotic syndrome     Patient Active Problem List   Diagnosis Date Noted  . Stage 5 chronic kidney disease not on chronic dialysis ([place]) 12/28/2017  . Hydronephrosis 10/15/2017  . AKI (acute kidney injury) ([place]) 09/30/2017  . even 1 per today and did not I would not with an order 02/07/2015    Past Surgical History:  Procedure Laterality Date  . CAPD INSERTION N/A 01/06/2018   Procedure: LAPAROSCOPIC INSERTION CONTINUOUS AMBULATORY PERITONEAL DIALYSIS  (CAPD) CATHETER;  Surgeon: Katha Cabal, MD;  Location: [facility];  Service: Vascular;  Laterality: N/A;  . CHOLECYSTECTOMY    . CYSTOSCOPY W/ URETERAL STENT REMOVAL Right 10/23/2017   Procedure: CYSTOSCOPY WITH STENT REPLACEMENT;  Surgeon: [name], MD;  Location: [facility];  Service: Urology;  Laterality: Right;  . CYSTOSCOPY WITH  STENT PLACEMENT Right 09/30/2017   Procedure: CYSTOSCOPY WITH STENT PLACEMENT;  Surgeon: [name], MD;  Location: [facility];  Service: Urology;  Laterality: Right;  . CYSTOSCOPY/RETROGRADE/URETEROSCOPY Right 10/23/2017   Procedure: CYSTOSCOPY/RETROGRADE/URETEROSCOPY;  Surgeon: [name], MD;  Location: [facility];  Service: Urology;  Laterality: Right;  . RENAL BIOPSY      Prior to Admission medications   Medication Sig Start Date End Date Taking? Authorizing Provider  famotidine (PEPCID) 20 MG tablet Take 1 tablet (20 mg total) by mouth 2 (two) times daily. Patient taking differently: Take 20 mg by mouth daily as needed for heartburn.  09/27/17 09/27/18  Darel Hong, MD  hydrALAZINE (APRESOLINE) 50 MG tablet Take 50 mg by mouth daily as needed (If diastolic bp is 90 or above).     [provider]  HYDROcodone-acetaminophen (NORCO) 5-325 MG tablet Take 1-2 tablets by mouth every 6 (six) hours as needed. Patient not taking: Reported on 01/21/2018 01/06/18   Schnier, Dolores Lory, MD  ibuprofen (ADVIL,MOTRIN) 200 MG tablet Take 400 mg by mouth daily as needed for headache or moderate pain.    [provider]  metoprolol succinate (TOPROL-XL) 100 MG 24 hr tablet Take 100 mg by mouth daily.    [provider]  nicotine (NICODERM CQ - DOSED IN MG/24 HOURS) 14 mg/24hr patch Place 14 mg onto the skin daily.    [provider]  oxybutynin (DITROPAN) 5 MG tablet Take by mouth. 01/18/18   [provider]  sertraline (ZOLOFT) 25 MG tablet Take 25 mg by mouth daily.  09/25/17   [provider]  sodium bicarbonate 650 MG tablet Take 1,300 mg by mouth 2 (two) times daily.     [provider]    Allergies Mushroom extract complex and Amoxicillin  Family History  Problem Relation Age of Onset  . Diabetes Mother   . Kidney disease Mother        mother on dialysis  . CAD Mother   . Diabetes Sister   . CAD Sister     Social History Social  History   Tobacco Use  . Smoking status: Former Smoker    Years: 20.00    Types: Cigarettes    Last attempt to quit: 08/22/2017    Years since quitting: 1.3  . Smokeless tobacco: Never Used  Substance Use Topics  . Alcohol use: Yes    Comment: occ. alcohol use  . Drug use: No    Review of Systems Constitutional: No fever/chills Eyes: No visual changes. ENT: No sore throat. No stiff neck no neck pain Cardiovascular: Denies chest pain. Respiratory: Denies shortness of breath. Gastrointestinal:   no vomiting.  No diarrhea.  No constipation. Genitourinary: Negative for dysuria. Musculoskeletal: Negative lower extremity swelling Skin: Negative for rash. Neurological: Negative for severe headaches, focal weakness or numbness.   ____________________________________________   PHYSICAL EXAM:  VITAL SIGNS: ED Triage Vitals  Enc Vitals Group     BP 12/14/18 1343 (!) 205/110     Pulse Rate 12/14/18 1343 77     Resp 12/14/18 1343 16     Temp 12/14/18 1343 98.4 F (36.9 C)     Temp Source 12/14/18 1343 Oral     SpO2 12/14/18 1343 100 %     Weight 12/14/18 1344 152 lb (68.9 kg)     Height 12/14/18 1344 5\' 6"  (1.676 m)     Head Circumference --      Peak Flow --      Pain Score 12/14/18 1344 0     Pain Loc --      Pain Edu? --      Excl. in [place]? --     Constitutional: Alert and oriented. Well appearing and in no acute distress. Eyes: Conjunctivae are normal Head: Atraumatic HEENT: No congestion/rhinnorhea. Mucous membranes are moist.  Oropharynx non-erythematous Neck:   Nontender with no meningismus, no masses, no stridor Cardiovascular: Normal rate, regular rhythm. Grossly normal heart sounds.  Good peripheral circulation. Respiratory: Normal respiratory effort.  No retractions. Lungs CTAB. Abdominal: Soft and nontender. No distention. No guarding no rebound Back:  There is no focal tenderness or step off.  there is no midline tenderness there are no lesions noted. there  is no CVA tenderness Musculoskeletal: No lower extremity tenderness, no upper extremity tenderness. No joint effusions, no DVT signs strong distal pulses no edema Neurologic:  Normal speech and language. No gross focal neurologic deficits are appreciated.  Skin:  Skin is warm, dry and intact. No rash noted. Psychiatric: Mood and affect are normal. Speech and behavior are normal.  ____________________________________________   LABS (all labs ordered are listed, but only abnormal results are displayed)  Labs Reviewed  CULTURE, BLOOD (ROUTINE X 2)  CULTURE, BLOOD (ROUTINE X 2)  CBC WITH DIFFERENTIAL/PLATELET  COMPREHENSIVE METABOLIC PANEL  URINALYSIS, COMPLETE (UACMP) WITH MICROSCOPIC  INFLUENZA PANEL BY PCR (TYPE A & B)    Pertinent labs  results that were available during my care of the patient  were reviewed by me and considered in my medical decision making (see chart for details). ____________________________________________  EKG  I personally interpreted any EKGs ordered by me or triage Sinus rhythm rate 64 bpm, no acute ST elevation or depression normal axis unremarkable EKG ____________________________________________  RADIOLOGY  Pertinent labs & imaging results that were available during my care of the patient were reviewed by me and considered in my medical decision making (see chart for details). If possible, patient and/or family made aware of any abnormal findings.  Dg Chest 1 View  Result Date: 12/14/2018 CLINICAL DATA:  Shortness of breath, cough, congestion EXAM: CHEST  1 VIEW COMPARISON:  03/09/2017 FINDINGS: The heart size and mediastinal contours are within normal limits. Both lungs are clear. The visualized skeletal structures are unremarkable. IMPRESSION: No active disease. Electronically Signed   By: Kathreen Devoid   On: 12/14/2018 14:11   ____________________________________________    PROCEDURES  Procedure(s) performed: None  Procedures  Critical Care  performed: None  ____________________________________________   INITIAL IMPRESSION / ASSESSMENT AND PLAN / ED COURSE  Pertinent labs & imaging results that were available during my care of the patient were reviewed by me and considered in my medical decision making (see chart for details).  With viral syndrome symptoms including cough URI rhinorrhea and nausea vomiting diarrhea.  Unfortunately in this day and age that could be in coronavirus.  However, there is no evidence at this time that he has peritoneal issues, his abdomen is completely benign and he is not having any tenderness there any reports no change in his peritoneal fluid. , spontaneous bacterial peritonitis does not cause rhinorrhea and cough which are his 2 major complaints.  Chest x-ray fortunately does not show a pneumonia.  And there is nothing to suggest ACS or PE or dissection on his presentation.  We will give him ginger IV fluids.  His blood pressure is elevated but a he is anxious and B.  He is without his blood pressure medications today.  I would prefer actually frankly his blood pressure be elevated then lower in the situation.  He did receive clonidine however immediately prior to coming in he has not used the medication I will avoid giving him other medications pending further evaluation of what that does to his pressures.  If I get him tolerating p.o. and his pressures do not bottom out from the clonidine I will consider giving him his home metoprolol.  However, I think his elevated blood pressures are relevant to his chief complaint.  If he does have the possibility of coronavirus there is no easy way for [place] to test its really nothing with testing that will come back in the next short period of time.  My last patient I tested on March 17 still does not have results.  Therefore it is clinically unuseful to test.  We will get blood cultures on him, and I will talk to nephrology about whether they want me to try to access his  peritoneal fluid.  ----------------------------------------- 4:02 PM on 12/14/2018 -----------------------------------------  Trending down, patient feels well at this time, he has been afebrile here, not tachycardic no evidence of sepsis white count is normal.  Very large hospital population of people who may have coronavirus it is likely safer for him to go home I advised him quarantine information and separation information.  Talk to Dr. Juleen China, who knows him from nephrology.  Very much appreciate consult.  He does not feel that is necessary at this time  to access the patient's peritoneal fluid.  He would like the patient to be discharged after a dose of Rocephin pending cultures.  He does not want him to continue on antibiotics, they will follow-up with the cultures in the clinic.  I think this is not unreasonable.  Patient is quite well-appearing and has been since he got here.  Very strongly feel this is most likely a viral syndrome given his possibility of infection we will send him with a dose of Rocephin.  The question really is in my mind whether this is coronavirus.  And unfortunately given that the nation has not yet established responsible testing we have no way of knowing.  Patient made aware of all this and he is very comfortable with plan to go home.   ____________________________________________   FINAL CLINICAL IMPRESSION(S) / ED DIAGNOSES  Final diagnoses:  SOB (shortness of breath)      This chart was dictated using voice recognition software.  Despite best efforts to proofread,  errors can occur which can change meaning.      Schuyler Amor, MD 12/14/18 1451    Schuyler Amor, MD 12/14/18 [phone]

## 2018-12-14 NOTE — ED Notes (Signed)
Pt verbalized understanding of d/c instructions, RX, and f/u care. No further questions at this time. Pt ambulatory to the exit with steady gait  

## 2018-12-15 DIAGNOSIS — N186 End stage renal disease: Secondary | ICD-10-CM | POA: Diagnosis not present

## 2018-12-15 DIAGNOSIS — Z992 Dependence on renal dialysis: Secondary | ICD-10-CM | POA: Diagnosis not present

## 2018-12-16 DIAGNOSIS — Z992 Dependence on renal dialysis: Secondary | ICD-10-CM | POA: Diagnosis not present

## 2018-12-16 DIAGNOSIS — N186 End stage renal disease: Secondary | ICD-10-CM | POA: Diagnosis not present

## 2018-12-17 DIAGNOSIS — N186 End stage renal disease: Secondary | ICD-10-CM | POA: Diagnosis not present

## 2018-12-17 DIAGNOSIS — Z992 Dependence on renal dialysis: Secondary | ICD-10-CM | POA: Diagnosis not present

## 2018-12-19 DIAGNOSIS — Z992 Dependence on renal dialysis: Secondary | ICD-10-CM | POA: Diagnosis not present

## 2018-12-19 DIAGNOSIS — N186 End stage renal disease: Secondary | ICD-10-CM | POA: Diagnosis not present

## 2018-12-19 LAB — CULTURE, BLOOD (ROUTINE X 2)
CULTURE: NO GROWTH
Culture: NO GROWTH
SPECIAL REQUESTS: ADEQUATE
Special Requests: ADEQUATE

## 2018-12-20 DIAGNOSIS — N186 End stage renal disease: Secondary | ICD-10-CM | POA: Diagnosis not present

## 2018-12-20 DIAGNOSIS — Z992 Dependence on renal dialysis: Secondary | ICD-10-CM | POA: Diagnosis not present

## 2018-12-21 DIAGNOSIS — N186 End stage renal disease: Secondary | ICD-10-CM | POA: Diagnosis not present

## 2018-12-21 DIAGNOSIS — Z992 Dependence on renal dialysis: Secondary | ICD-10-CM | POA: Diagnosis not present

## 2018-12-22 DIAGNOSIS — Z992 Dependence on renal dialysis: Secondary | ICD-10-CM | POA: Diagnosis not present

## 2018-12-22 DIAGNOSIS — N186 End stage renal disease: Secondary | ICD-10-CM | POA: Diagnosis not present

## 2018-12-23 DIAGNOSIS — Z992 Dependence on renal dialysis: Secondary | ICD-10-CM | POA: Diagnosis not present

## 2018-12-23 DIAGNOSIS — N186 End stage renal disease: Secondary | ICD-10-CM | POA: Diagnosis not present

## 2018-12-24 DIAGNOSIS — N186 End stage renal disease: Secondary | ICD-10-CM | POA: Diagnosis not present

## 2018-12-24 DIAGNOSIS — Z992 Dependence on renal dialysis: Secondary | ICD-10-CM | POA: Diagnosis not present

## 2018-12-26 DIAGNOSIS — N186 End stage renal disease: Secondary | ICD-10-CM | POA: Diagnosis not present

## 2018-12-26 DIAGNOSIS — Z992 Dependence on renal dialysis: Secondary | ICD-10-CM | POA: Diagnosis not present

## 2018-12-27 DIAGNOSIS — Z992 Dependence on renal dialysis: Secondary | ICD-10-CM | POA: Diagnosis not present

## 2018-12-27 DIAGNOSIS — N186 End stage renal disease: Secondary | ICD-10-CM | POA: Diagnosis not present

## 2018-12-28 DIAGNOSIS — N186 End stage renal disease: Secondary | ICD-10-CM | POA: Diagnosis not present

## 2018-12-28 DIAGNOSIS — Z992 Dependence on renal dialysis: Secondary | ICD-10-CM | POA: Diagnosis not present

## 2018-12-29 DIAGNOSIS — Z992 Dependence on renal dialysis: Secondary | ICD-10-CM | POA: Diagnosis not present

## 2018-12-29 DIAGNOSIS — N186 End stage renal disease: Secondary | ICD-10-CM | POA: Diagnosis not present

## 2018-12-30 DIAGNOSIS — N186 End stage renal disease: Secondary | ICD-10-CM | POA: Diagnosis not present

## 2018-12-30 DIAGNOSIS — Z992 Dependence on renal dialysis: Secondary | ICD-10-CM | POA: Diagnosis not present

## 2018-12-31 DIAGNOSIS — N186 End stage renal disease: Secondary | ICD-10-CM | POA: Diagnosis not present

## 2018-12-31 DIAGNOSIS — Z992 Dependence on renal dialysis: Secondary | ICD-10-CM | POA: Diagnosis not present

## 2019-01-02 DIAGNOSIS — N186 End stage renal disease: Secondary | ICD-10-CM | POA: Diagnosis not present

## 2019-01-02 DIAGNOSIS — Z992 Dependence on renal dialysis: Secondary | ICD-10-CM | POA: Diagnosis not present

## 2019-01-03 DIAGNOSIS — N186 End stage renal disease: Secondary | ICD-10-CM | POA: Diagnosis not present

## 2019-01-03 DIAGNOSIS — Z992 Dependence on renal dialysis: Secondary | ICD-10-CM | POA: Diagnosis not present

## 2019-01-04 DIAGNOSIS — Z992 Dependence on renal dialysis: Secondary | ICD-10-CM | POA: Diagnosis not present

## 2019-01-04 DIAGNOSIS — N186 End stage renal disease: Secondary | ICD-10-CM | POA: Diagnosis not present

## 2019-01-05 DIAGNOSIS — Z992 Dependence on renal dialysis: Secondary | ICD-10-CM | POA: Diagnosis not present

## 2019-01-05 DIAGNOSIS — N186 End stage renal disease: Secondary | ICD-10-CM | POA: Diagnosis not present

## 2019-01-06 DIAGNOSIS — N186 End stage renal disease: Secondary | ICD-10-CM | POA: Diagnosis not present

## 2019-01-06 DIAGNOSIS — Z992 Dependence on renal dialysis: Secondary | ICD-10-CM | POA: Diagnosis not present

## 2019-01-07 DIAGNOSIS — N186 End stage renal disease: Secondary | ICD-10-CM | POA: Diagnosis not present

## 2019-01-07 DIAGNOSIS — Z992 Dependence on renal dialysis: Secondary | ICD-10-CM | POA: Diagnosis not present

## 2019-01-09 DIAGNOSIS — N186 End stage renal disease: Secondary | ICD-10-CM | POA: Diagnosis not present

## 2019-01-09 DIAGNOSIS — Z992 Dependence on renal dialysis: Secondary | ICD-10-CM | POA: Diagnosis not present

## 2019-01-10 DIAGNOSIS — N186 End stage renal disease: Secondary | ICD-10-CM | POA: Diagnosis not present

## 2019-01-10 DIAGNOSIS — Z992 Dependence on renal dialysis: Secondary | ICD-10-CM | POA: Diagnosis not present

## 2019-01-11 DIAGNOSIS — Z992 Dependence on renal dialysis: Secondary | ICD-10-CM | POA: Diagnosis not present

## 2019-01-11 DIAGNOSIS — N186 End stage renal disease: Secondary | ICD-10-CM | POA: Diagnosis not present

## 2019-01-12 DIAGNOSIS — Z992 Dependence on renal dialysis: Secondary | ICD-10-CM | POA: Diagnosis not present

## 2019-01-12 DIAGNOSIS — N186 End stage renal disease: Secondary | ICD-10-CM | POA: Diagnosis not present

## 2019-01-13 DIAGNOSIS — Z992 Dependence on renal dialysis: Secondary | ICD-10-CM | POA: Diagnosis not present

## 2019-01-13 DIAGNOSIS — N186 End stage renal disease: Secondary | ICD-10-CM | POA: Diagnosis not present

## 2019-01-14 DIAGNOSIS — N186 End stage renal disease: Secondary | ICD-10-CM | POA: Diagnosis not present

## 2019-01-14 DIAGNOSIS — Z992 Dependence on renal dialysis: Secondary | ICD-10-CM | POA: Diagnosis not present

## 2019-01-16 DIAGNOSIS — N186 End stage renal disease: Secondary | ICD-10-CM | POA: Diagnosis not present

## 2019-01-16 DIAGNOSIS — Z992 Dependence on renal dialysis: Secondary | ICD-10-CM | POA: Diagnosis not present

## 2019-01-17 DIAGNOSIS — Z992 Dependence on renal dialysis: Secondary | ICD-10-CM | POA: Diagnosis not present

## 2019-01-17 DIAGNOSIS — N186 End stage renal disease: Secondary | ICD-10-CM | POA: Diagnosis not present

## 2019-01-18 DIAGNOSIS — N186 End stage renal disease: Secondary | ICD-10-CM | POA: Diagnosis not present

## 2019-01-18 DIAGNOSIS — Z992 Dependence on renal dialysis: Secondary | ICD-10-CM | POA: Diagnosis not present

## 2019-01-19 DIAGNOSIS — N186 End stage renal disease: Secondary | ICD-10-CM | POA: Diagnosis not present

## 2019-01-19 DIAGNOSIS — Z992 Dependence on renal dialysis: Secondary | ICD-10-CM | POA: Diagnosis not present

## 2019-01-20 DIAGNOSIS — Z992 Dependence on renal dialysis: Secondary | ICD-10-CM | POA: Diagnosis not present

## 2019-01-20 DIAGNOSIS — N186 End stage renal disease: Secondary | ICD-10-CM | POA: Diagnosis not present

## 2019-01-21 DIAGNOSIS — N186 End stage renal disease: Secondary | ICD-10-CM | POA: Diagnosis not present

## 2019-01-21 DIAGNOSIS — Z992 Dependence on renal dialysis: Secondary | ICD-10-CM | POA: Diagnosis not present

## 2019-01-23 DIAGNOSIS — N186 End stage renal disease: Secondary | ICD-10-CM | POA: Diagnosis not present

## 2019-01-23 DIAGNOSIS — Z992 Dependence on renal dialysis: Secondary | ICD-10-CM | POA: Diagnosis not present

## 2019-01-24 DIAGNOSIS — Z992 Dependence on renal dialysis: Secondary | ICD-10-CM | POA: Diagnosis not present

## 2019-01-24 DIAGNOSIS — N186 End stage renal disease: Secondary | ICD-10-CM | POA: Diagnosis not present

## 2019-01-25 DIAGNOSIS — N186 End stage renal disease: Secondary | ICD-10-CM | POA: Diagnosis not present

## 2019-01-25 DIAGNOSIS — Z992 Dependence on renal dialysis: Secondary | ICD-10-CM | POA: Diagnosis not present

## 2019-01-26 DIAGNOSIS — Z992 Dependence on renal dialysis: Secondary | ICD-10-CM | POA: Diagnosis not present

## 2019-01-26 DIAGNOSIS — N186 End stage renal disease: Secondary | ICD-10-CM | POA: Diagnosis not present

## 2019-01-27 DIAGNOSIS — Z992 Dependence on renal dialysis: Secondary | ICD-10-CM | POA: Diagnosis not present

## 2019-01-27 DIAGNOSIS — N186 End stage renal disease: Secondary | ICD-10-CM | POA: Diagnosis not present

## 2019-01-28 DIAGNOSIS — N186 End stage renal disease: Secondary | ICD-10-CM | POA: Diagnosis not present

## 2019-01-28 DIAGNOSIS — Z992 Dependence on renal dialysis: Secondary | ICD-10-CM | POA: Diagnosis not present

## 2019-01-30 DIAGNOSIS — Z992 Dependence on renal dialysis: Secondary | ICD-10-CM | POA: Diagnosis not present

## 2019-01-30 DIAGNOSIS — N186 End stage renal disease: Secondary | ICD-10-CM | POA: Diagnosis not present

## 2019-01-31 DIAGNOSIS — N186 End stage renal disease: Secondary | ICD-10-CM | POA: Diagnosis not present

## 2019-01-31 DIAGNOSIS — Z992 Dependence on renal dialysis: Secondary | ICD-10-CM | POA: Diagnosis not present

## 2019-02-01 DIAGNOSIS — Z992 Dependence on renal dialysis: Secondary | ICD-10-CM | POA: Diagnosis not present

## 2019-02-01 DIAGNOSIS — N186 End stage renal disease: Secondary | ICD-10-CM | POA: Diagnosis not present

## 2019-02-02 DIAGNOSIS — N186 End stage renal disease: Secondary | ICD-10-CM | POA: Diagnosis not present

## 2019-02-02 DIAGNOSIS — Z992 Dependence on renal dialysis: Secondary | ICD-10-CM | POA: Diagnosis not present

## 2019-02-03 DIAGNOSIS — Z992 Dependence on renal dialysis: Secondary | ICD-10-CM | POA: Diagnosis not present

## 2019-02-03 DIAGNOSIS — N186 End stage renal disease: Secondary | ICD-10-CM | POA: Diagnosis not present

## 2019-02-04 DIAGNOSIS — N186 End stage renal disease: Secondary | ICD-10-CM | POA: Diagnosis not present

## 2019-02-04 DIAGNOSIS — Z992 Dependence on renal dialysis: Secondary | ICD-10-CM | POA: Diagnosis not present

## 2019-02-06 DIAGNOSIS — N186 End stage renal disease: Secondary | ICD-10-CM | POA: Diagnosis not present

## 2019-02-06 DIAGNOSIS — Z992 Dependence on renal dialysis: Secondary | ICD-10-CM | POA: Diagnosis not present

## 2019-02-07 DIAGNOSIS — Z992 Dependence on renal dialysis: Secondary | ICD-10-CM | POA: Diagnosis not present

## 2019-02-07 DIAGNOSIS — N186 End stage renal disease: Secondary | ICD-10-CM | POA: Diagnosis not present

## 2019-02-08 DIAGNOSIS — Z992 Dependence on renal dialysis: Secondary | ICD-10-CM | POA: Diagnosis not present

## 2019-02-08 DIAGNOSIS — N186 End stage renal disease: Secondary | ICD-10-CM | POA: Diagnosis not present

## 2019-02-09 DIAGNOSIS — N186 End stage renal disease: Secondary | ICD-10-CM | POA: Diagnosis not present

## 2019-02-09 DIAGNOSIS — Z992 Dependence on renal dialysis: Secondary | ICD-10-CM | POA: Diagnosis not present

## 2019-02-10 DIAGNOSIS — Z992 Dependence on renal dialysis: Secondary | ICD-10-CM | POA: Diagnosis not present

## 2019-02-10 DIAGNOSIS — N186 End stage renal disease: Secondary | ICD-10-CM | POA: Diagnosis not present

## 2019-02-11 DIAGNOSIS — Z992 Dependence on renal dialysis: Secondary | ICD-10-CM | POA: Diagnosis not present

## 2019-02-11 DIAGNOSIS — N186 End stage renal disease: Secondary | ICD-10-CM | POA: Diagnosis not present

## 2019-02-13 DIAGNOSIS — Z992 Dependence on renal dialysis: Secondary | ICD-10-CM | POA: Diagnosis not present

## 2019-02-13 DIAGNOSIS — N186 End stage renal disease: Secondary | ICD-10-CM | POA: Diagnosis not present

## 2019-02-14 DIAGNOSIS — N186 End stage renal disease: Secondary | ICD-10-CM | POA: Diagnosis not present

## 2019-02-14 DIAGNOSIS — Z992 Dependence on renal dialysis: Secondary | ICD-10-CM | POA: Diagnosis not present

## 2019-02-15 DIAGNOSIS — N186 End stage renal disease: Secondary | ICD-10-CM | POA: Diagnosis not present

## 2019-02-15 DIAGNOSIS — Z992 Dependence on renal dialysis: Secondary | ICD-10-CM | POA: Diagnosis not present

## 2019-02-16 DIAGNOSIS — Z992 Dependence on renal dialysis: Secondary | ICD-10-CM | POA: Diagnosis not present

## 2019-02-16 DIAGNOSIS — N186 End stage renal disease: Secondary | ICD-10-CM | POA: Diagnosis not present

## 2019-02-17 DIAGNOSIS — Z992 Dependence on renal dialysis: Secondary | ICD-10-CM | POA: Diagnosis not present

## 2019-02-17 DIAGNOSIS — N186 End stage renal disease: Secondary | ICD-10-CM | POA: Diagnosis not present

## 2019-02-18 DIAGNOSIS — Z992 Dependence on renal dialysis: Secondary | ICD-10-CM | POA: Diagnosis not present

## 2019-02-18 DIAGNOSIS — N186 End stage renal disease: Secondary | ICD-10-CM | POA: Diagnosis not present

## 2019-02-20 DIAGNOSIS — Z992 Dependence on renal dialysis: Secondary | ICD-10-CM | POA: Diagnosis not present

## 2019-02-20 DIAGNOSIS — N186 End stage renal disease: Secondary | ICD-10-CM | POA: Diagnosis not present

## 2019-02-23 DIAGNOSIS — Z992 Dependence on renal dialysis: Secondary | ICD-10-CM | POA: Diagnosis not present

## 2019-02-23 DIAGNOSIS — N186 End stage renal disease: Secondary | ICD-10-CM | POA: Diagnosis not present

## 2019-02-24 DIAGNOSIS — N186 End stage renal disease: Secondary | ICD-10-CM | POA: Diagnosis not present

## 2019-02-24 DIAGNOSIS — Z992 Dependence on renal dialysis: Secondary | ICD-10-CM | POA: Diagnosis not present

## 2019-02-25 DIAGNOSIS — N186 End stage renal disease: Secondary | ICD-10-CM | POA: Diagnosis not present

## 2019-02-25 DIAGNOSIS — Z992 Dependence on renal dialysis: Secondary | ICD-10-CM | POA: Diagnosis not present

## 2019-02-27 DIAGNOSIS — N186 End stage renal disease: Secondary | ICD-10-CM | POA: Diagnosis not present

## 2019-02-27 DIAGNOSIS — Z992 Dependence on renal dialysis: Secondary | ICD-10-CM | POA: Diagnosis not present

## 2019-02-28 DIAGNOSIS — N186 End stage renal disease: Secondary | ICD-10-CM | POA: Diagnosis not present

## 2019-02-28 DIAGNOSIS — Z992 Dependence on renal dialysis: Secondary | ICD-10-CM | POA: Diagnosis not present

## 2019-03-01 DIAGNOSIS — Z992 Dependence on renal dialysis: Secondary | ICD-10-CM | POA: Diagnosis not present

## 2019-03-01 DIAGNOSIS — N186 End stage renal disease: Secondary | ICD-10-CM | POA: Diagnosis not present

## 2019-03-02 DIAGNOSIS — N186 End stage renal disease: Secondary | ICD-10-CM | POA: Diagnosis not present

## 2019-03-02 DIAGNOSIS — Z992 Dependence on renal dialysis: Secondary | ICD-10-CM | POA: Diagnosis not present

## 2019-03-03 DIAGNOSIS — Z992 Dependence on renal dialysis: Secondary | ICD-10-CM | POA: Diagnosis not present

## 2019-03-03 DIAGNOSIS — N186 End stage renal disease: Secondary | ICD-10-CM | POA: Diagnosis not present

## 2019-03-04 DIAGNOSIS — N186 End stage renal disease: Secondary | ICD-10-CM | POA: Diagnosis not present

## 2019-03-04 DIAGNOSIS — Z992 Dependence on renal dialysis: Secondary | ICD-10-CM | POA: Diagnosis not present

## 2019-03-06 DIAGNOSIS — Z992 Dependence on renal dialysis: Secondary | ICD-10-CM | POA: Diagnosis not present

## 2019-03-06 DIAGNOSIS — N186 End stage renal disease: Secondary | ICD-10-CM | POA: Diagnosis not present

## 2019-03-07 DIAGNOSIS — Z992 Dependence on renal dialysis: Secondary | ICD-10-CM | POA: Diagnosis not present

## 2019-03-07 DIAGNOSIS — N186 End stage renal disease: Secondary | ICD-10-CM | POA: Diagnosis not present

## 2019-03-08 DIAGNOSIS — N186 End stage renal disease: Secondary | ICD-10-CM | POA: Diagnosis not present

## 2019-03-08 DIAGNOSIS — Z992 Dependence on renal dialysis: Secondary | ICD-10-CM | POA: Diagnosis not present

## 2019-03-09 DIAGNOSIS — Z992 Dependence on renal dialysis: Secondary | ICD-10-CM | POA: Diagnosis not present

## 2019-03-09 DIAGNOSIS — N186 End stage renal disease: Secondary | ICD-10-CM | POA: Diagnosis not present

## 2019-03-10 DIAGNOSIS — N186 End stage renal disease: Secondary | ICD-10-CM | POA: Diagnosis not present

## 2019-03-10 DIAGNOSIS — Z992 Dependence on renal dialysis: Secondary | ICD-10-CM | POA: Diagnosis not present

## 2019-03-11 DIAGNOSIS — Z992 Dependence on renal dialysis: Secondary | ICD-10-CM | POA: Diagnosis not present

## 2019-03-11 DIAGNOSIS — N186 End stage renal disease: Secondary | ICD-10-CM | POA: Diagnosis not present

## 2019-03-13 DIAGNOSIS — Z992 Dependence on renal dialysis: Secondary | ICD-10-CM | POA: Diagnosis not present

## 2019-03-13 DIAGNOSIS — N186 End stage renal disease: Secondary | ICD-10-CM | POA: Diagnosis not present

## 2019-03-14 DIAGNOSIS — N186 End stage renal disease: Secondary | ICD-10-CM | POA: Diagnosis not present

## 2019-03-14 DIAGNOSIS — Z992 Dependence on renal dialysis: Secondary | ICD-10-CM | POA: Diagnosis not present

## 2019-03-15 DIAGNOSIS — Z992 Dependence on renal dialysis: Secondary | ICD-10-CM | POA: Diagnosis not present

## 2019-03-15 DIAGNOSIS — N186 End stage renal disease: Secondary | ICD-10-CM | POA: Diagnosis not present

## 2019-03-16 DIAGNOSIS — N186 End stage renal disease: Secondary | ICD-10-CM | POA: Diagnosis not present

## 2019-03-16 DIAGNOSIS — Z992 Dependence on renal dialysis: Secondary | ICD-10-CM | POA: Diagnosis not present

## 2019-03-17 DIAGNOSIS — Z992 Dependence on renal dialysis: Secondary | ICD-10-CM | POA: Diagnosis not present

## 2019-03-17 DIAGNOSIS — N186 End stage renal disease: Secondary | ICD-10-CM | POA: Diagnosis not present

## 2019-03-18 DIAGNOSIS — Z992 Dependence on renal dialysis: Secondary | ICD-10-CM | POA: Diagnosis not present

## 2019-03-18 DIAGNOSIS — N186 End stage renal disease: Secondary | ICD-10-CM | POA: Diagnosis not present

## 2019-03-20 DIAGNOSIS — Z992 Dependence on renal dialysis: Secondary | ICD-10-CM | POA: Diagnosis not present

## 2019-03-20 DIAGNOSIS — N186 End stage renal disease: Secondary | ICD-10-CM | POA: Diagnosis not present

## 2019-03-21 DIAGNOSIS — N186 End stage renal disease: Secondary | ICD-10-CM | POA: Diagnosis not present

## 2019-03-21 DIAGNOSIS — Z992 Dependence on renal dialysis: Secondary | ICD-10-CM | POA: Diagnosis not present

## 2019-03-22 DIAGNOSIS — N186 End stage renal disease: Secondary | ICD-10-CM | POA: Diagnosis not present

## 2019-03-22 DIAGNOSIS — Z992 Dependence on renal dialysis: Secondary | ICD-10-CM | POA: Diagnosis not present

## 2019-03-23 DIAGNOSIS — Z992 Dependence on renal dialysis: Secondary | ICD-10-CM | POA: Diagnosis not present

## 2019-03-23 DIAGNOSIS — N186 End stage renal disease: Secondary | ICD-10-CM | POA: Diagnosis not present

## 2019-03-24 DIAGNOSIS — Z992 Dependence on renal dialysis: Secondary | ICD-10-CM | POA: Diagnosis not present

## 2019-03-24 DIAGNOSIS — N186 End stage renal disease: Secondary | ICD-10-CM | POA: Diagnosis not present

## 2019-03-25 DIAGNOSIS — Z992 Dependence on renal dialysis: Secondary | ICD-10-CM | POA: Diagnosis not present

## 2019-03-25 DIAGNOSIS — N186 End stage renal disease: Secondary | ICD-10-CM | POA: Diagnosis not present

## 2019-03-27 DIAGNOSIS — Z992 Dependence on renal dialysis: Secondary | ICD-10-CM | POA: Diagnosis not present

## 2019-03-27 DIAGNOSIS — N186 End stage renal disease: Secondary | ICD-10-CM | POA: Diagnosis not present

## 2019-03-28 DIAGNOSIS — N186 End stage renal disease: Secondary | ICD-10-CM | POA: Diagnosis not present

## 2019-03-28 DIAGNOSIS — Z992 Dependence on renal dialysis: Secondary | ICD-10-CM | POA: Diagnosis not present

## 2019-03-29 DIAGNOSIS — N186 End stage renal disease: Secondary | ICD-10-CM | POA: Diagnosis not present

## 2019-03-29 DIAGNOSIS — Z992 Dependence on renal dialysis: Secondary | ICD-10-CM | POA: Diagnosis not present

## 2019-03-30 DIAGNOSIS — N186 End stage renal disease: Secondary | ICD-10-CM | POA: Diagnosis not present

## 2019-03-30 DIAGNOSIS — Z992 Dependence on renal dialysis: Secondary | ICD-10-CM | POA: Diagnosis not present

## 2019-03-31 DIAGNOSIS — N186 End stage renal disease: Secondary | ICD-10-CM | POA: Diagnosis not present

## 2019-03-31 DIAGNOSIS — Z992 Dependence on renal dialysis: Secondary | ICD-10-CM | POA: Diagnosis not present

## 2019-04-01 DIAGNOSIS — Z992 Dependence on renal dialysis: Secondary | ICD-10-CM | POA: Diagnosis not present

## 2019-04-01 DIAGNOSIS — N186 End stage renal disease: Secondary | ICD-10-CM | POA: Diagnosis not present

## 2019-04-03 DIAGNOSIS — N186 End stage renal disease: Secondary | ICD-10-CM | POA: Diagnosis not present

## 2019-04-03 DIAGNOSIS — Z992 Dependence on renal dialysis: Secondary | ICD-10-CM | POA: Diagnosis not present

## 2019-04-04 DIAGNOSIS — Z992 Dependence on renal dialysis: Secondary | ICD-10-CM | POA: Diagnosis not present

## 2019-04-04 DIAGNOSIS — N186 End stage renal disease: Secondary | ICD-10-CM | POA: Diagnosis not present

## 2019-04-05 DIAGNOSIS — Z992 Dependence on renal dialysis: Secondary | ICD-10-CM | POA: Diagnosis not present

## 2019-04-05 DIAGNOSIS — R509 Fever, unspecified: Secondary | ICD-10-CM | POA: Diagnosis not present

## 2019-04-05 DIAGNOSIS — N186 End stage renal disease: Secondary | ICD-10-CM | POA: Diagnosis not present

## 2019-04-05 DIAGNOSIS — R05 Cough: Secondary | ICD-10-CM | POA: Diagnosis not present

## 2019-04-06 DIAGNOSIS — Z992 Dependence on renal dialysis: Secondary | ICD-10-CM | POA: Diagnosis not present

## 2019-04-06 DIAGNOSIS — N186 End stage renal disease: Secondary | ICD-10-CM | POA: Diagnosis not present

## 2019-04-07 DIAGNOSIS — Z992 Dependence on renal dialysis: Secondary | ICD-10-CM | POA: Diagnosis not present

## 2019-04-07 DIAGNOSIS — N186 End stage renal disease: Secondary | ICD-10-CM | POA: Diagnosis not present

## 2019-04-08 DIAGNOSIS — Z992 Dependence on renal dialysis: Secondary | ICD-10-CM | POA: Diagnosis not present

## 2019-04-08 DIAGNOSIS — N186 End stage renal disease: Secondary | ICD-10-CM | POA: Diagnosis not present

## 2019-04-10 DIAGNOSIS — N186 End stage renal disease: Secondary | ICD-10-CM | POA: Diagnosis not present

## 2019-04-10 DIAGNOSIS — Z992 Dependence on renal dialysis: Secondary | ICD-10-CM | POA: Diagnosis not present

## 2019-04-11 DIAGNOSIS — Z992 Dependence on renal dialysis: Secondary | ICD-10-CM | POA: Diagnosis not present

## 2019-04-11 DIAGNOSIS — N186 End stage renal disease: Secondary | ICD-10-CM | POA: Diagnosis not present

## 2019-04-12 DIAGNOSIS — D509 Iron deficiency anemia, unspecified: Secondary | ICD-10-CM | POA: Diagnosis not present

## 2019-04-12 DIAGNOSIS — E878 Other disorders of electrolyte and fluid balance, not elsewhere classified: Secondary | ICD-10-CM | POA: Diagnosis not present

## 2019-04-12 DIAGNOSIS — N186 End stage renal disease: Secondary | ICD-10-CM | POA: Diagnosis not present

## 2019-04-12 DIAGNOSIS — Z992 Dependence on renal dialysis: Secondary | ICD-10-CM | POA: Diagnosis not present

## 2019-04-13 DIAGNOSIS — Z992 Dependence on renal dialysis: Secondary | ICD-10-CM | POA: Diagnosis not present

## 2019-04-13 DIAGNOSIS — N186 End stage renal disease: Secondary | ICD-10-CM | POA: Diagnosis not present

## 2019-04-14 DIAGNOSIS — N186 End stage renal disease: Secondary | ICD-10-CM | POA: Diagnosis not present

## 2019-04-14 DIAGNOSIS — Z992 Dependence on renal dialysis: Secondary | ICD-10-CM | POA: Diagnosis not present

## 2019-04-15 DIAGNOSIS — N186 End stage renal disease: Secondary | ICD-10-CM | POA: Diagnosis not present

## 2019-04-15 DIAGNOSIS — Z992 Dependence on renal dialysis: Secondary | ICD-10-CM | POA: Diagnosis not present

## 2019-04-17 DIAGNOSIS — N186 End stage renal disease: Secondary | ICD-10-CM | POA: Diagnosis not present

## 2019-04-17 DIAGNOSIS — Z992 Dependence on renal dialysis: Secondary | ICD-10-CM | POA: Diagnosis not present

## 2019-04-18 DIAGNOSIS — N186 End stage renal disease: Secondary | ICD-10-CM | POA: Diagnosis not present

## 2019-04-18 DIAGNOSIS — Z992 Dependence on renal dialysis: Secondary | ICD-10-CM | POA: Diagnosis not present

## 2019-04-19 DIAGNOSIS — N186 End stage renal disease: Secondary | ICD-10-CM | POA: Diagnosis not present

## 2019-04-19 DIAGNOSIS — Z992 Dependence on renal dialysis: Secondary | ICD-10-CM | POA: Diagnosis not present

## 2019-04-20 DIAGNOSIS — Z992 Dependence on renal dialysis: Secondary | ICD-10-CM | POA: Diagnosis not present

## 2019-04-20 DIAGNOSIS — N186 End stage renal disease: Secondary | ICD-10-CM | POA: Diagnosis not present

## 2019-04-21 DIAGNOSIS — Z992 Dependence on renal dialysis: Secondary | ICD-10-CM | POA: Diagnosis not present

## 2019-04-21 DIAGNOSIS — N186 End stage renal disease: Secondary | ICD-10-CM | POA: Diagnosis not present

## 2019-04-22 DIAGNOSIS — Z992 Dependence on renal dialysis: Secondary | ICD-10-CM | POA: Diagnosis not present

## 2019-04-22 DIAGNOSIS — N186 End stage renal disease: Secondary | ICD-10-CM | POA: Diagnosis not present

## 2019-04-24 DIAGNOSIS — Z992 Dependence on renal dialysis: Secondary | ICD-10-CM | POA: Diagnosis not present

## 2019-04-24 DIAGNOSIS — N186 End stage renal disease: Secondary | ICD-10-CM | POA: Diagnosis not present

## 2019-04-25 DIAGNOSIS — Z992 Dependence on renal dialysis: Secondary | ICD-10-CM | POA: Diagnosis not present

## 2019-04-25 DIAGNOSIS — N186 End stage renal disease: Secondary | ICD-10-CM | POA: Diagnosis not present

## 2019-04-26 DIAGNOSIS — Z992 Dependence on renal dialysis: Secondary | ICD-10-CM | POA: Diagnosis not present

## 2019-04-26 DIAGNOSIS — N186 End stage renal disease: Secondary | ICD-10-CM | POA: Diagnosis not present

## 2019-04-27 DIAGNOSIS — N186 End stage renal disease: Secondary | ICD-10-CM | POA: Diagnosis not present

## 2019-04-27 DIAGNOSIS — Z992 Dependence on renal dialysis: Secondary | ICD-10-CM | POA: Diagnosis not present

## 2019-04-28 DIAGNOSIS — Z992 Dependence on renal dialysis: Secondary | ICD-10-CM | POA: Diagnosis not present

## 2019-04-28 DIAGNOSIS — N186 End stage renal disease: Secondary | ICD-10-CM | POA: Diagnosis not present

## 2019-04-29 DIAGNOSIS — Z992 Dependence on renal dialysis: Secondary | ICD-10-CM | POA: Diagnosis not present

## 2019-04-29 DIAGNOSIS — N186 End stage renal disease: Secondary | ICD-10-CM | POA: Diagnosis not present

## 2019-05-01 DIAGNOSIS — N186 End stage renal disease: Secondary | ICD-10-CM | POA: Diagnosis not present

## 2019-05-01 DIAGNOSIS — Z992 Dependence on renal dialysis: Secondary | ICD-10-CM | POA: Diagnosis not present

## 2019-05-02 DIAGNOSIS — Z992 Dependence on renal dialysis: Secondary | ICD-10-CM | POA: Diagnosis not present

## 2019-05-02 DIAGNOSIS — N186 End stage renal disease: Secondary | ICD-10-CM | POA: Diagnosis not present

## 2019-05-02 DIAGNOSIS — D509 Iron deficiency anemia, unspecified: Secondary | ICD-10-CM | POA: Diagnosis not present

## 2019-05-02 DIAGNOSIS — E878 Other disorders of electrolyte and fluid balance, not elsewhere classified: Secondary | ICD-10-CM | POA: Diagnosis not present

## 2019-05-03 DIAGNOSIS — Z992 Dependence on renal dialysis: Secondary | ICD-10-CM | POA: Diagnosis not present

## 2019-05-03 DIAGNOSIS — N186 End stage renal disease: Secondary | ICD-10-CM | POA: Diagnosis not present

## 2019-05-04 DIAGNOSIS — Z992 Dependence on renal dialysis: Secondary | ICD-10-CM | POA: Diagnosis not present

## 2019-05-04 DIAGNOSIS — N186 End stage renal disease: Secondary | ICD-10-CM | POA: Diagnosis not present

## 2019-05-05 DIAGNOSIS — N186 End stage renal disease: Secondary | ICD-10-CM | POA: Diagnosis not present

## 2019-05-05 DIAGNOSIS — Z992 Dependence on renal dialysis: Secondary | ICD-10-CM | POA: Diagnosis not present

## 2019-05-06 DIAGNOSIS — Z992 Dependence on renal dialysis: Secondary | ICD-10-CM | POA: Diagnosis not present

## 2019-05-06 DIAGNOSIS — N186 End stage renal disease: Secondary | ICD-10-CM | POA: Diagnosis not present

## 2019-05-08 DIAGNOSIS — N186 End stage renal disease: Secondary | ICD-10-CM | POA: Diagnosis not present

## 2019-05-08 DIAGNOSIS — Z992 Dependence on renal dialysis: Secondary | ICD-10-CM | POA: Diagnosis not present

## 2019-05-09 DIAGNOSIS — N186 End stage renal disease: Secondary | ICD-10-CM | POA: Diagnosis not present

## 2019-05-09 DIAGNOSIS — Z992 Dependence on renal dialysis: Secondary | ICD-10-CM | POA: Diagnosis not present

## 2019-05-10 DIAGNOSIS — N186 End stage renal disease: Secondary | ICD-10-CM | POA: Diagnosis not present

## 2019-05-10 DIAGNOSIS — Z992 Dependence on renal dialysis: Secondary | ICD-10-CM | POA: Diagnosis not present

## 2019-05-11 DIAGNOSIS — Z992 Dependence on renal dialysis: Secondary | ICD-10-CM | POA: Diagnosis not present

## 2019-05-11 DIAGNOSIS — N186 End stage renal disease: Secondary | ICD-10-CM | POA: Diagnosis not present

## 2019-05-12 DIAGNOSIS — N186 End stage renal disease: Secondary | ICD-10-CM | POA: Diagnosis not present

## 2019-05-12 DIAGNOSIS — Z992 Dependence on renal dialysis: Secondary | ICD-10-CM | POA: Diagnosis not present

## 2019-05-13 DIAGNOSIS — Z992 Dependence on renal dialysis: Secondary | ICD-10-CM | POA: Diagnosis not present

## 2019-05-13 DIAGNOSIS — N186 End stage renal disease: Secondary | ICD-10-CM | POA: Diagnosis not present

## 2019-05-15 DIAGNOSIS — N186 End stage renal disease: Secondary | ICD-10-CM | POA: Diagnosis not present

## 2019-05-15 DIAGNOSIS — Z992 Dependence on renal dialysis: Secondary | ICD-10-CM | POA: Diagnosis not present

## 2019-05-16 DIAGNOSIS — Z992 Dependence on renal dialysis: Secondary | ICD-10-CM | POA: Diagnosis not present

## 2019-05-16 DIAGNOSIS — N186 End stage renal disease: Secondary | ICD-10-CM | POA: Diagnosis not present

## 2019-05-17 DIAGNOSIS — Z992 Dependence on renal dialysis: Secondary | ICD-10-CM | POA: Diagnosis not present

## 2019-05-17 DIAGNOSIS — N186 End stage renal disease: Secondary | ICD-10-CM | POA: Diagnosis not present

## 2019-05-18 DIAGNOSIS — N186 End stage renal disease: Secondary | ICD-10-CM | POA: Diagnosis not present

## 2019-05-18 DIAGNOSIS — Z992 Dependence on renal dialysis: Secondary | ICD-10-CM | POA: Diagnosis not present

## 2019-05-19 DIAGNOSIS — Z992 Dependence on renal dialysis: Secondary | ICD-10-CM | POA: Diagnosis not present

## 2019-05-19 DIAGNOSIS — N186 End stage renal disease: Secondary | ICD-10-CM | POA: Diagnosis not present

## 2019-05-20 DIAGNOSIS — N186 End stage renal disease: Secondary | ICD-10-CM | POA: Diagnosis not present

## 2019-05-20 DIAGNOSIS — Z992 Dependence on renal dialysis: Secondary | ICD-10-CM | POA: Diagnosis not present

## 2019-05-22 DIAGNOSIS — Z992 Dependence on renal dialysis: Secondary | ICD-10-CM | POA: Diagnosis not present

## 2019-05-22 DIAGNOSIS — N186 End stage renal disease: Secondary | ICD-10-CM | POA: Diagnosis not present

## 2019-05-23 DIAGNOSIS — Z992 Dependence on renal dialysis: Secondary | ICD-10-CM | POA: Diagnosis not present

## 2019-05-23 DIAGNOSIS — N186 End stage renal disease: Secondary | ICD-10-CM | POA: Diagnosis not present

## 2019-05-24 DIAGNOSIS — E878 Other disorders of electrolyte and fluid balance, not elsewhere classified: Secondary | ICD-10-CM | POA: Diagnosis not present

## 2019-05-24 DIAGNOSIS — Z992 Dependence on renal dialysis: Secondary | ICD-10-CM | POA: Diagnosis not present

## 2019-05-24 DIAGNOSIS — N186 End stage renal disease: Secondary | ICD-10-CM | POA: Diagnosis not present

## 2019-05-25 DIAGNOSIS — N186 End stage renal disease: Secondary | ICD-10-CM | POA: Diagnosis not present

## 2019-05-25 DIAGNOSIS — Z992 Dependence on renal dialysis: Secondary | ICD-10-CM | POA: Diagnosis not present

## 2019-05-26 DIAGNOSIS — N186 End stage renal disease: Secondary | ICD-10-CM | POA: Diagnosis not present

## 2019-05-26 DIAGNOSIS — Z992 Dependence on renal dialysis: Secondary | ICD-10-CM | POA: Diagnosis not present

## 2019-05-27 DIAGNOSIS — Z992 Dependence on renal dialysis: Secondary | ICD-10-CM | POA: Diagnosis not present

## 2019-05-27 DIAGNOSIS — N186 End stage renal disease: Secondary | ICD-10-CM | POA: Diagnosis not present

## 2019-05-29 DIAGNOSIS — Z992 Dependence on renal dialysis: Secondary | ICD-10-CM | POA: Diagnosis not present

## 2019-05-29 DIAGNOSIS — N186 End stage renal disease: Secondary | ICD-10-CM | POA: Diagnosis not present

## 2019-05-30 DIAGNOSIS — N186 End stage renal disease: Secondary | ICD-10-CM | POA: Diagnosis not present

## 2019-05-30 DIAGNOSIS — Z992 Dependence on renal dialysis: Secondary | ICD-10-CM | POA: Diagnosis not present

## 2019-05-31 DIAGNOSIS — N186 End stage renal disease: Secondary | ICD-10-CM | POA: Diagnosis not present

## 2019-05-31 DIAGNOSIS — Z992 Dependence on renal dialysis: Secondary | ICD-10-CM | POA: Diagnosis not present

## 2019-06-01 DIAGNOSIS — Z992 Dependence on renal dialysis: Secondary | ICD-10-CM | POA: Diagnosis not present

## 2019-06-01 DIAGNOSIS — N186 End stage renal disease: Secondary | ICD-10-CM | POA: Diagnosis not present

## 2019-06-02 DIAGNOSIS — Z992 Dependence on renal dialysis: Secondary | ICD-10-CM | POA: Diagnosis not present

## 2019-06-02 DIAGNOSIS — N186 End stage renal disease: Secondary | ICD-10-CM | POA: Diagnosis not present

## 2019-06-03 DIAGNOSIS — Z992 Dependence on renal dialysis: Secondary | ICD-10-CM | POA: Diagnosis not present

## 2019-06-03 DIAGNOSIS — N186 End stage renal disease: Secondary | ICD-10-CM | POA: Diagnosis not present

## 2019-06-05 DIAGNOSIS — Z992 Dependence on renal dialysis: Secondary | ICD-10-CM | POA: Diagnosis not present

## 2019-06-05 DIAGNOSIS — N186 End stage renal disease: Secondary | ICD-10-CM | POA: Diagnosis not present

## 2019-06-06 DIAGNOSIS — Z992 Dependence on renal dialysis: Secondary | ICD-10-CM | POA: Diagnosis not present

## 2019-06-06 DIAGNOSIS — N186 End stage renal disease: Secondary | ICD-10-CM | POA: Diagnosis not present

## 2019-06-07 DIAGNOSIS — N186 End stage renal disease: Secondary | ICD-10-CM | POA: Diagnosis not present

## 2019-06-07 DIAGNOSIS — Z992 Dependence on renal dialysis: Secondary | ICD-10-CM | POA: Diagnosis not present

## 2019-06-08 DIAGNOSIS — N186 End stage renal disease: Secondary | ICD-10-CM | POA: Diagnosis not present

## 2019-06-08 DIAGNOSIS — Z992 Dependence on renal dialysis: Secondary | ICD-10-CM | POA: Diagnosis not present

## 2019-06-09 DIAGNOSIS — N186 End stage renal disease: Secondary | ICD-10-CM | POA: Diagnosis not present

## 2019-06-09 DIAGNOSIS — Z992 Dependence on renal dialysis: Secondary | ICD-10-CM | POA: Diagnosis not present

## 2019-06-10 DIAGNOSIS — N186 End stage renal disease: Secondary | ICD-10-CM | POA: Diagnosis not present

## 2019-06-10 DIAGNOSIS — Z992 Dependence on renal dialysis: Secondary | ICD-10-CM | POA: Diagnosis not present

## 2019-06-12 DIAGNOSIS — N186 End stage renal disease: Secondary | ICD-10-CM | POA: Diagnosis not present

## 2019-06-12 DIAGNOSIS — Z992 Dependence on renal dialysis: Secondary | ICD-10-CM | POA: Diagnosis not present

## 2019-06-13 DIAGNOSIS — Z992 Dependence on renal dialysis: Secondary | ICD-10-CM | POA: Diagnosis not present

## 2019-06-13 DIAGNOSIS — N186 End stage renal disease: Secondary | ICD-10-CM | POA: Diagnosis not present

## 2019-06-14 DIAGNOSIS — N186 End stage renal disease: Secondary | ICD-10-CM | POA: Diagnosis not present

## 2019-06-14 DIAGNOSIS — Z992 Dependence on renal dialysis: Secondary | ICD-10-CM | POA: Diagnosis not present

## 2019-06-15 DIAGNOSIS — N186 End stage renal disease: Secondary | ICD-10-CM | POA: Diagnosis not present

## 2019-06-15 DIAGNOSIS — Z992 Dependence on renal dialysis: Secondary | ICD-10-CM | POA: Diagnosis not present

## 2019-06-16 DIAGNOSIS — Z992 Dependence on renal dialysis: Secondary | ICD-10-CM | POA: Diagnosis not present

## 2019-06-16 DIAGNOSIS — N186 End stage renal disease: Secondary | ICD-10-CM | POA: Diagnosis not present

## 2019-06-17 DIAGNOSIS — Z992 Dependence on renal dialysis: Secondary | ICD-10-CM | POA: Diagnosis not present

## 2019-06-17 DIAGNOSIS — N186 End stage renal disease: Secondary | ICD-10-CM | POA: Diagnosis not present

## 2019-06-19 DIAGNOSIS — N186 End stage renal disease: Secondary | ICD-10-CM | POA: Diagnosis not present

## 2019-06-19 DIAGNOSIS — Z992 Dependence on renal dialysis: Secondary | ICD-10-CM | POA: Diagnosis not present

## 2019-06-20 DIAGNOSIS — N186 End stage renal disease: Secondary | ICD-10-CM | POA: Diagnosis not present

## 2019-06-20 DIAGNOSIS — Z992 Dependence on renal dialysis: Secondary | ICD-10-CM | POA: Diagnosis not present

## 2019-06-21 DIAGNOSIS — N186 End stage renal disease: Secondary | ICD-10-CM | POA: Diagnosis not present

## 2019-06-21 DIAGNOSIS — Z992 Dependence on renal dialysis: Secondary | ICD-10-CM | POA: Diagnosis not present

## 2019-06-22 DIAGNOSIS — Z992 Dependence on renal dialysis: Secondary | ICD-10-CM | POA: Diagnosis not present

## 2019-06-22 DIAGNOSIS — N186 End stage renal disease: Secondary | ICD-10-CM | POA: Diagnosis not present

## 2019-06-23 DIAGNOSIS — Z23 Encounter for immunization: Secondary | ICD-10-CM | POA: Diagnosis not present

## 2019-06-23 DIAGNOSIS — Z992 Dependence on renal dialysis: Secondary | ICD-10-CM | POA: Diagnosis not present

## 2019-06-23 DIAGNOSIS — N186 End stage renal disease: Secondary | ICD-10-CM | POA: Diagnosis not present

## 2019-06-24 DIAGNOSIS — Z23 Encounter for immunization: Secondary | ICD-10-CM | POA: Diagnosis not present

## 2019-06-24 DIAGNOSIS — Z992 Dependence on renal dialysis: Secondary | ICD-10-CM | POA: Diagnosis not present

## 2019-06-24 DIAGNOSIS — N186 End stage renal disease: Secondary | ICD-10-CM | POA: Diagnosis not present

## 2019-06-26 DIAGNOSIS — Z992 Dependence on renal dialysis: Secondary | ICD-10-CM | POA: Diagnosis not present

## 2019-06-26 DIAGNOSIS — N186 End stage renal disease: Secondary | ICD-10-CM | POA: Diagnosis not present

## 2019-06-26 DIAGNOSIS — Z23 Encounter for immunization: Secondary | ICD-10-CM | POA: Diagnosis not present

## 2019-06-27 DIAGNOSIS — Z992 Dependence on renal dialysis: Secondary | ICD-10-CM | POA: Diagnosis not present

## 2019-06-27 DIAGNOSIS — E878 Other disorders of electrolyte and fluid balance, not elsewhere classified: Secondary | ICD-10-CM | POA: Diagnosis not present

## 2019-06-27 DIAGNOSIS — D509 Iron deficiency anemia, unspecified: Secondary | ICD-10-CM | POA: Diagnosis not present

## 2019-06-27 DIAGNOSIS — Z23 Encounter for immunization: Secondary | ICD-10-CM | POA: Diagnosis not present

## 2019-06-27 DIAGNOSIS — N186 End stage renal disease: Secondary | ICD-10-CM | POA: Diagnosis not present

## 2019-06-28 DIAGNOSIS — N186 End stage renal disease: Secondary | ICD-10-CM | POA: Diagnosis not present

## 2019-06-28 DIAGNOSIS — Z23 Encounter for immunization: Secondary | ICD-10-CM | POA: Diagnosis not present

## 2019-06-28 DIAGNOSIS — Z992 Dependence on renal dialysis: Secondary | ICD-10-CM | POA: Diagnosis not present

## 2019-06-29 DIAGNOSIS — N186 End stage renal disease: Secondary | ICD-10-CM | POA: Diagnosis not present

## 2019-06-29 DIAGNOSIS — Z23 Encounter for immunization: Secondary | ICD-10-CM | POA: Diagnosis not present

## 2019-06-29 DIAGNOSIS — Z992 Dependence on renal dialysis: Secondary | ICD-10-CM | POA: Diagnosis not present

## 2019-06-30 DIAGNOSIS — N186 End stage renal disease: Secondary | ICD-10-CM | POA: Diagnosis not present

## 2019-06-30 DIAGNOSIS — Z992 Dependence on renal dialysis: Secondary | ICD-10-CM | POA: Diagnosis not present

## 2019-07-01 DIAGNOSIS — N186 End stage renal disease: Secondary | ICD-10-CM | POA: Diagnosis not present

## 2019-07-01 DIAGNOSIS — Z992 Dependence on renal dialysis: Secondary | ICD-10-CM | POA: Diagnosis not present

## 2019-07-03 DIAGNOSIS — N186 End stage renal disease: Secondary | ICD-10-CM | POA: Diagnosis not present

## 2019-07-03 DIAGNOSIS — Z992 Dependence on renal dialysis: Secondary | ICD-10-CM | POA: Diagnosis not present

## 2019-07-04 DIAGNOSIS — Z992 Dependence on renal dialysis: Secondary | ICD-10-CM | POA: Diagnosis not present

## 2019-07-04 DIAGNOSIS — N186 End stage renal disease: Secondary | ICD-10-CM | POA: Diagnosis not present

## 2019-07-05 DIAGNOSIS — N186 End stage renal disease: Secondary | ICD-10-CM | POA: Diagnosis not present

## 2019-07-05 DIAGNOSIS — Z992 Dependence on renal dialysis: Secondary | ICD-10-CM | POA: Diagnosis not present

## 2019-07-06 DIAGNOSIS — N186 End stage renal disease: Secondary | ICD-10-CM | POA: Diagnosis not present

## 2019-07-06 DIAGNOSIS — Z992 Dependence on renal dialysis: Secondary | ICD-10-CM | POA: Diagnosis not present

## 2019-07-07 DIAGNOSIS — N186 End stage renal disease: Secondary | ICD-10-CM | POA: Diagnosis not present

## 2019-07-07 DIAGNOSIS — Z992 Dependence on renal dialysis: Secondary | ICD-10-CM | POA: Diagnosis not present

## 2019-07-08 DIAGNOSIS — Z992 Dependence on renal dialysis: Secondary | ICD-10-CM | POA: Diagnosis not present

## 2019-07-08 DIAGNOSIS — N186 End stage renal disease: Secondary | ICD-10-CM | POA: Diagnosis not present

## 2019-07-10 DIAGNOSIS — N186 End stage renal disease: Secondary | ICD-10-CM | POA: Diagnosis not present

## 2019-07-10 DIAGNOSIS — Z992 Dependence on renal dialysis: Secondary | ICD-10-CM | POA: Diagnosis not present

## 2019-07-11 DIAGNOSIS — N186 End stage renal disease: Secondary | ICD-10-CM | POA: Diagnosis not present

## 2019-07-11 DIAGNOSIS — Z992 Dependence on renal dialysis: Secondary | ICD-10-CM | POA: Diagnosis not present

## 2019-07-12 DIAGNOSIS — N186 End stage renal disease: Secondary | ICD-10-CM | POA: Diagnosis not present

## 2019-07-12 DIAGNOSIS — Z992 Dependence on renal dialysis: Secondary | ICD-10-CM | POA: Diagnosis not present

## 2019-07-13 DIAGNOSIS — N186 End stage renal disease: Secondary | ICD-10-CM | POA: Diagnosis not present

## 2019-07-13 DIAGNOSIS — Z992 Dependence on renal dialysis: Secondary | ICD-10-CM | POA: Diagnosis not present

## 2019-07-14 DIAGNOSIS — N186 End stage renal disease: Secondary | ICD-10-CM | POA: Diagnosis not present

## 2019-07-14 DIAGNOSIS — Z992 Dependence on renal dialysis: Secondary | ICD-10-CM | POA: Diagnosis not present

## 2019-07-15 DIAGNOSIS — Z992 Dependence on renal dialysis: Secondary | ICD-10-CM | POA: Diagnosis not present

## 2019-07-15 DIAGNOSIS — N186 End stage renal disease: Secondary | ICD-10-CM | POA: Diagnosis not present

## 2019-07-17 DIAGNOSIS — Z992 Dependence on renal dialysis: Secondary | ICD-10-CM | POA: Diagnosis not present

## 2019-07-17 DIAGNOSIS — N186 End stage renal disease: Secondary | ICD-10-CM | POA: Diagnosis not present

## 2019-07-18 DIAGNOSIS — Z992 Dependence on renal dialysis: Secondary | ICD-10-CM | POA: Diagnosis not present

## 2019-07-18 DIAGNOSIS — N186 End stage renal disease: Secondary | ICD-10-CM | POA: Diagnosis not present

## 2019-07-19 DIAGNOSIS — Z992 Dependence on renal dialysis: Secondary | ICD-10-CM | POA: Diagnosis not present

## 2019-07-19 DIAGNOSIS — N186 End stage renal disease: Secondary | ICD-10-CM | POA: Diagnosis not present

## 2019-07-20 DIAGNOSIS — N186 End stage renal disease: Secondary | ICD-10-CM | POA: Diagnosis not present

## 2019-07-20 DIAGNOSIS — Z992 Dependence on renal dialysis: Secondary | ICD-10-CM | POA: Diagnosis not present

## 2019-07-21 DIAGNOSIS — Z992 Dependence on renal dialysis: Secondary | ICD-10-CM | POA: Diagnosis not present

## 2019-07-21 DIAGNOSIS — N186 End stage renal disease: Secondary | ICD-10-CM | POA: Diagnosis not present

## 2019-07-22 DIAGNOSIS — N186 End stage renal disease: Secondary | ICD-10-CM | POA: Diagnosis not present

## 2019-07-22 DIAGNOSIS — Z992 Dependence on renal dialysis: Secondary | ICD-10-CM | POA: Diagnosis not present

## 2019-07-23 DIAGNOSIS — Z992 Dependence on renal dialysis: Secondary | ICD-10-CM | POA: Diagnosis not present

## 2019-07-23 DIAGNOSIS — N186 End stage renal disease: Secondary | ICD-10-CM | POA: Diagnosis not present

## 2019-07-24 DIAGNOSIS — Z992 Dependence on renal dialysis: Secondary | ICD-10-CM | POA: Diagnosis not present

## 2019-07-24 DIAGNOSIS — N186 End stage renal disease: Secondary | ICD-10-CM | POA: Diagnosis not present

## 2019-07-25 DIAGNOSIS — N186 End stage renal disease: Secondary | ICD-10-CM | POA: Diagnosis not present

## 2019-07-25 DIAGNOSIS — Z992 Dependence on renal dialysis: Secondary | ICD-10-CM | POA: Diagnosis not present

## 2019-07-26 DIAGNOSIS — N186 End stage renal disease: Secondary | ICD-10-CM | POA: Diagnosis not present

## 2019-07-26 DIAGNOSIS — Z992 Dependence on renal dialysis: Secondary | ICD-10-CM | POA: Diagnosis not present

## 2019-07-27 DIAGNOSIS — Z992 Dependence on renal dialysis: Secondary | ICD-10-CM | POA: Diagnosis not present

## 2019-07-27 DIAGNOSIS — N186 End stage renal disease: Secondary | ICD-10-CM | POA: Diagnosis not present

## 2019-07-28 DIAGNOSIS — N186 End stage renal disease: Secondary | ICD-10-CM | POA: Diagnosis not present

## 2019-07-28 DIAGNOSIS — Z992 Dependence on renal dialysis: Secondary | ICD-10-CM | POA: Diagnosis not present

## 2019-07-29 DIAGNOSIS — E878 Other disorders of electrolyte and fluid balance, not elsewhere classified: Secondary | ICD-10-CM | POA: Diagnosis not present

## 2019-07-29 DIAGNOSIS — N186 End stage renal disease: Secondary | ICD-10-CM | POA: Diagnosis not present

## 2019-07-29 DIAGNOSIS — Z992 Dependence on renal dialysis: Secondary | ICD-10-CM | POA: Diagnosis not present

## 2019-07-29 DIAGNOSIS — D509 Iron deficiency anemia, unspecified: Secondary | ICD-10-CM | POA: Diagnosis not present

## 2019-07-31 DIAGNOSIS — N186 End stage renal disease: Secondary | ICD-10-CM | POA: Diagnosis not present

## 2019-07-31 DIAGNOSIS — Z992 Dependence on renal dialysis: Secondary | ICD-10-CM | POA: Diagnosis not present

## 2019-08-01 DIAGNOSIS — N186 End stage renal disease: Secondary | ICD-10-CM | POA: Diagnosis not present

## 2019-08-01 DIAGNOSIS — Z992 Dependence on renal dialysis: Secondary | ICD-10-CM | POA: Diagnosis not present

## 2019-08-02 DIAGNOSIS — N186 End stage renal disease: Secondary | ICD-10-CM | POA: Diagnosis not present

## 2019-08-02 DIAGNOSIS — Z992 Dependence on renal dialysis: Secondary | ICD-10-CM | POA: Diagnosis not present

## 2019-08-03 DIAGNOSIS — Z992 Dependence on renal dialysis: Secondary | ICD-10-CM | POA: Diagnosis not present

## 2019-08-03 DIAGNOSIS — N186 End stage renal disease: Secondary | ICD-10-CM | POA: Diagnosis not present

## 2019-08-04 DIAGNOSIS — N186 End stage renal disease: Secondary | ICD-10-CM | POA: Diagnosis not present

## 2019-08-04 DIAGNOSIS — Z992 Dependence on renal dialysis: Secondary | ICD-10-CM | POA: Diagnosis not present

## 2019-08-05 DIAGNOSIS — N186 End stage renal disease: Secondary | ICD-10-CM | POA: Diagnosis not present

## 2019-08-05 DIAGNOSIS — Z992 Dependence on renal dialysis: Secondary | ICD-10-CM | POA: Diagnosis not present

## 2019-08-07 DIAGNOSIS — N186 End stage renal disease: Secondary | ICD-10-CM | POA: Diagnosis not present

## 2019-08-07 DIAGNOSIS — Z992 Dependence on renal dialysis: Secondary | ICD-10-CM | POA: Diagnosis not present

## 2019-08-08 DIAGNOSIS — Z992 Dependence on renal dialysis: Secondary | ICD-10-CM | POA: Diagnosis not present

## 2019-08-08 DIAGNOSIS — N186 End stage renal disease: Secondary | ICD-10-CM | POA: Diagnosis not present

## 2019-08-09 DIAGNOSIS — Z992 Dependence on renal dialysis: Secondary | ICD-10-CM | POA: Diagnosis not present

## 2019-08-09 DIAGNOSIS — N186 End stage renal disease: Secondary | ICD-10-CM | POA: Diagnosis not present

## 2019-08-10 DIAGNOSIS — Z992 Dependence on renal dialysis: Secondary | ICD-10-CM | POA: Diagnosis not present

## 2019-08-10 DIAGNOSIS — N186 End stage renal disease: Secondary | ICD-10-CM | POA: Diagnosis not present

## 2019-08-11 DIAGNOSIS — N186 End stage renal disease: Secondary | ICD-10-CM | POA: Diagnosis not present

## 2019-08-11 DIAGNOSIS — Z992 Dependence on renal dialysis: Secondary | ICD-10-CM | POA: Diagnosis not present

## 2019-08-12 DIAGNOSIS — N186 End stage renal disease: Secondary | ICD-10-CM | POA: Diagnosis not present

## 2019-08-12 DIAGNOSIS — Z992 Dependence on renal dialysis: Secondary | ICD-10-CM | POA: Diagnosis not present

## 2019-08-14 DIAGNOSIS — Z992 Dependence on renal dialysis: Secondary | ICD-10-CM | POA: Diagnosis not present

## 2019-08-14 DIAGNOSIS — N186 End stage renal disease: Secondary | ICD-10-CM | POA: Diagnosis not present

## 2019-08-15 DIAGNOSIS — N186 End stage renal disease: Secondary | ICD-10-CM | POA: Diagnosis not present

## 2019-08-15 DIAGNOSIS — Z992 Dependence on renal dialysis: Secondary | ICD-10-CM | POA: Diagnosis not present

## 2019-08-16 DIAGNOSIS — N186 End stage renal disease: Secondary | ICD-10-CM | POA: Diagnosis not present

## 2019-08-16 DIAGNOSIS — Z992 Dependence on renal dialysis: Secondary | ICD-10-CM | POA: Diagnosis not present

## 2019-08-17 DIAGNOSIS — Z992 Dependence on renal dialysis: Secondary | ICD-10-CM | POA: Diagnosis not present

## 2019-08-17 DIAGNOSIS — N186 End stage renal disease: Secondary | ICD-10-CM | POA: Diagnosis not present

## 2019-08-18 DIAGNOSIS — N186 End stage renal disease: Secondary | ICD-10-CM | POA: Diagnosis not present

## 2019-08-18 DIAGNOSIS — Z992 Dependence on renal dialysis: Secondary | ICD-10-CM | POA: Diagnosis not present

## 2019-08-19 DIAGNOSIS — N186 End stage renal disease: Secondary | ICD-10-CM | POA: Diagnosis not present

## 2019-08-19 DIAGNOSIS — Z992 Dependence on renal dialysis: Secondary | ICD-10-CM | POA: Diagnosis not present

## 2019-08-21 DIAGNOSIS — N186 End stage renal disease: Secondary | ICD-10-CM | POA: Diagnosis not present

## 2019-08-21 DIAGNOSIS — Z992 Dependence on renal dialysis: Secondary | ICD-10-CM | POA: Diagnosis not present

## 2019-08-22 DIAGNOSIS — N186 End stage renal disease: Secondary | ICD-10-CM | POA: Diagnosis not present

## 2019-08-22 DIAGNOSIS — Z992 Dependence on renal dialysis: Secondary | ICD-10-CM | POA: Diagnosis not present

## 2019-08-23 DIAGNOSIS — Z992 Dependence on renal dialysis: Secondary | ICD-10-CM | POA: Diagnosis not present

## 2019-08-23 DIAGNOSIS — N186 End stage renal disease: Secondary | ICD-10-CM | POA: Diagnosis not present

## 2019-08-24 DIAGNOSIS — N186 End stage renal disease: Secondary | ICD-10-CM | POA: Diagnosis not present

## 2019-08-24 DIAGNOSIS — Z992 Dependence on renal dialysis: Secondary | ICD-10-CM | POA: Diagnosis not present

## 2019-08-25 DIAGNOSIS — N186 End stage renal disease: Secondary | ICD-10-CM | POA: Diagnosis not present

## 2019-08-25 DIAGNOSIS — Z992 Dependence on renal dialysis: Secondary | ICD-10-CM | POA: Diagnosis not present

## 2019-08-26 DIAGNOSIS — N186 End stage renal disease: Secondary | ICD-10-CM | POA: Diagnosis not present

## 2019-08-26 DIAGNOSIS — Z992 Dependence on renal dialysis: Secondary | ICD-10-CM | POA: Diagnosis not present

## 2019-08-28 DIAGNOSIS — Z992 Dependence on renal dialysis: Secondary | ICD-10-CM | POA: Diagnosis not present

## 2019-08-28 DIAGNOSIS — N186 End stage renal disease: Secondary | ICD-10-CM | POA: Diagnosis not present

## 2019-08-29 DIAGNOSIS — D509 Iron deficiency anemia, unspecified: Secondary | ICD-10-CM | POA: Diagnosis not present

## 2019-08-29 DIAGNOSIS — E878 Other disorders of electrolyte and fluid balance, not elsewhere classified: Secondary | ICD-10-CM | POA: Diagnosis not present

## 2019-08-29 DIAGNOSIS — Z992 Dependence on renal dialysis: Secondary | ICD-10-CM | POA: Diagnosis not present

## 2019-08-29 DIAGNOSIS — N186 End stage renal disease: Secondary | ICD-10-CM | POA: Diagnosis not present

## 2019-08-30 DIAGNOSIS — N186 End stage renal disease: Secondary | ICD-10-CM | POA: Diagnosis not present

## 2019-08-30 DIAGNOSIS — Z992 Dependence on renal dialysis: Secondary | ICD-10-CM | POA: Diagnosis not present

## 2019-08-31 DIAGNOSIS — Z992 Dependence on renal dialysis: Secondary | ICD-10-CM | POA: Diagnosis not present

## 2019-08-31 DIAGNOSIS — N186 End stage renal disease: Secondary | ICD-10-CM | POA: Diagnosis not present

## 2019-09-01 DIAGNOSIS — Z992 Dependence on renal dialysis: Secondary | ICD-10-CM | POA: Diagnosis not present

## 2019-09-01 DIAGNOSIS — N186 End stage renal disease: Secondary | ICD-10-CM | POA: Diagnosis not present

## 2019-09-02 DIAGNOSIS — N186 End stage renal disease: Secondary | ICD-10-CM | POA: Diagnosis not present

## 2019-09-02 DIAGNOSIS — Z992 Dependence on renal dialysis: Secondary | ICD-10-CM | POA: Diagnosis not present

## 2019-09-04 DIAGNOSIS — Z992 Dependence on renal dialysis: Secondary | ICD-10-CM | POA: Diagnosis not present

## 2019-09-04 DIAGNOSIS — N186 End stage renal disease: Secondary | ICD-10-CM | POA: Diagnosis not present

## 2019-09-05 DIAGNOSIS — N186 End stage renal disease: Secondary | ICD-10-CM | POA: Diagnosis not present

## 2019-09-05 DIAGNOSIS — Z992 Dependence on renal dialysis: Secondary | ICD-10-CM | POA: Diagnosis not present

## 2019-09-06 DIAGNOSIS — Z992 Dependence on renal dialysis: Secondary | ICD-10-CM | POA: Diagnosis not present

## 2019-09-06 DIAGNOSIS — N186 End stage renal disease: Secondary | ICD-10-CM | POA: Diagnosis not present

## 2019-09-07 DIAGNOSIS — Z992 Dependence on renal dialysis: Secondary | ICD-10-CM | POA: Diagnosis not present

## 2019-09-07 DIAGNOSIS — N186 End stage renal disease: Secondary | ICD-10-CM | POA: Diagnosis not present

## 2019-09-08 DIAGNOSIS — Z992 Dependence on renal dialysis: Secondary | ICD-10-CM | POA: Diagnosis not present

## 2019-09-08 DIAGNOSIS — N186 End stage renal disease: Secondary | ICD-10-CM | POA: Diagnosis not present

## 2019-09-09 DIAGNOSIS — Z992 Dependence on renal dialysis: Secondary | ICD-10-CM | POA: Diagnosis not present

## 2019-09-09 DIAGNOSIS — N186 End stage renal disease: Secondary | ICD-10-CM | POA: Diagnosis not present

## 2019-09-11 DIAGNOSIS — N186 End stage renal disease: Secondary | ICD-10-CM | POA: Diagnosis not present

## 2019-09-11 DIAGNOSIS — Z992 Dependence on renal dialysis: Secondary | ICD-10-CM | POA: Diagnosis not present

## 2019-09-12 DIAGNOSIS — Z992 Dependence on renal dialysis: Secondary | ICD-10-CM | POA: Diagnosis not present

## 2019-09-12 DIAGNOSIS — N186 End stage renal disease: Secondary | ICD-10-CM | POA: Diagnosis not present

## 2019-09-13 DIAGNOSIS — N186 End stage renal disease: Secondary | ICD-10-CM | POA: Diagnosis not present

## 2019-09-13 DIAGNOSIS — Z992 Dependence on renal dialysis: Secondary | ICD-10-CM | POA: Diagnosis not present

## 2019-09-14 DIAGNOSIS — N186 End stage renal disease: Secondary | ICD-10-CM | POA: Diagnosis not present

## 2019-09-14 DIAGNOSIS — Z992 Dependence on renal dialysis: Secondary | ICD-10-CM | POA: Diagnosis not present

## 2019-09-15 DIAGNOSIS — Z992 Dependence on renal dialysis: Secondary | ICD-10-CM | POA: Diagnosis not present

## 2019-09-15 DIAGNOSIS — N186 End stage renal disease: Secondary | ICD-10-CM | POA: Diagnosis not present

## 2019-09-16 DIAGNOSIS — N186 End stage renal disease: Secondary | ICD-10-CM | POA: Diagnosis not present

## 2019-09-16 DIAGNOSIS — Z992 Dependence on renal dialysis: Secondary | ICD-10-CM | POA: Diagnosis not present

## 2019-09-18 DIAGNOSIS — Z992 Dependence on renal dialysis: Secondary | ICD-10-CM | POA: Diagnosis not present

## 2019-09-18 DIAGNOSIS — N186 End stage renal disease: Secondary | ICD-10-CM | POA: Diagnosis not present

## 2019-09-19 DIAGNOSIS — Z992 Dependence on renal dialysis: Secondary | ICD-10-CM | POA: Diagnosis not present

## 2019-09-19 DIAGNOSIS — N186 End stage renal disease: Secondary | ICD-10-CM | POA: Diagnosis not present

## 2019-09-20 DIAGNOSIS — N186 End stage renal disease: Secondary | ICD-10-CM | POA: Diagnosis not present

## 2019-09-20 DIAGNOSIS — Z992 Dependence on renal dialysis: Secondary | ICD-10-CM | POA: Diagnosis not present

## 2019-09-21 DIAGNOSIS — Z992 Dependence on renal dialysis: Secondary | ICD-10-CM | POA: Diagnosis not present

## 2019-09-21 DIAGNOSIS — N186 End stage renal disease: Secondary | ICD-10-CM | POA: Diagnosis not present

## 2019-09-22 DIAGNOSIS — Z992 Dependence on renal dialysis: Secondary | ICD-10-CM | POA: Diagnosis not present

## 2019-09-22 DIAGNOSIS — N186 End stage renal disease: Secondary | ICD-10-CM | POA: Diagnosis not present

## 2019-09-23 DIAGNOSIS — N186 End stage renal disease: Secondary | ICD-10-CM | POA: Diagnosis not present

## 2019-09-23 DIAGNOSIS — Z992 Dependence on renal dialysis: Secondary | ICD-10-CM | POA: Diagnosis not present

## 2019-09-25 DIAGNOSIS — N186 End stage renal disease: Secondary | ICD-10-CM | POA: Diagnosis not present

## 2019-09-25 DIAGNOSIS — Z992 Dependence on renal dialysis: Secondary | ICD-10-CM | POA: Diagnosis not present

## 2019-09-26 DIAGNOSIS — Z992 Dependence on renal dialysis: Secondary | ICD-10-CM | POA: Diagnosis not present

## 2019-09-26 DIAGNOSIS — E878 Other disorders of electrolyte and fluid balance, not elsewhere classified: Secondary | ICD-10-CM | POA: Diagnosis not present

## 2019-09-26 DIAGNOSIS — N186 End stage renal disease: Secondary | ICD-10-CM | POA: Diagnosis not present

## 2019-09-26 DIAGNOSIS — D509 Iron deficiency anemia, unspecified: Secondary | ICD-10-CM | POA: Diagnosis not present

## 2019-09-27 DIAGNOSIS — Z992 Dependence on renal dialysis: Secondary | ICD-10-CM | POA: Diagnosis not present

## 2019-09-27 DIAGNOSIS — N186 End stage renal disease: Secondary | ICD-10-CM | POA: Diagnosis not present

## 2019-09-28 DIAGNOSIS — N186 End stage renal disease: Secondary | ICD-10-CM | POA: Diagnosis not present

## 2019-09-28 DIAGNOSIS — Z992 Dependence on renal dialysis: Secondary | ICD-10-CM | POA: Diagnosis not present

## 2019-09-29 DIAGNOSIS — N186 End stage renal disease: Secondary | ICD-10-CM | POA: Diagnosis not present

## 2019-09-29 DIAGNOSIS — Z992 Dependence on renal dialysis: Secondary | ICD-10-CM | POA: Diagnosis not present

## 2019-09-30 DIAGNOSIS — Z992 Dependence on renal dialysis: Secondary | ICD-10-CM | POA: Diagnosis not present

## 2019-09-30 DIAGNOSIS — N186 End stage renal disease: Secondary | ICD-10-CM | POA: Diagnosis not present

## 2019-10-02 DIAGNOSIS — N186 End stage renal disease: Secondary | ICD-10-CM | POA: Diagnosis not present

## 2019-10-02 DIAGNOSIS — Z992 Dependence on renal dialysis: Secondary | ICD-10-CM | POA: Diagnosis not present

## 2019-10-03 DIAGNOSIS — Z992 Dependence on renal dialysis: Secondary | ICD-10-CM | POA: Diagnosis not present

## 2019-10-03 DIAGNOSIS — N186 End stage renal disease: Secondary | ICD-10-CM | POA: Diagnosis not present

## 2019-10-04 DIAGNOSIS — Z992 Dependence on renal dialysis: Secondary | ICD-10-CM | POA: Diagnosis not present

## 2019-10-04 DIAGNOSIS — N186 End stage renal disease: Secondary | ICD-10-CM | POA: Diagnosis not present

## 2019-10-05 DIAGNOSIS — N186 End stage renal disease: Secondary | ICD-10-CM | POA: Diagnosis not present

## 2019-10-05 DIAGNOSIS — Z992 Dependence on renal dialysis: Secondary | ICD-10-CM | POA: Diagnosis not present

## 2019-10-06 DIAGNOSIS — N186 End stage renal disease: Secondary | ICD-10-CM | POA: Diagnosis not present

## 2019-10-06 DIAGNOSIS — Z992 Dependence on renal dialysis: Secondary | ICD-10-CM | POA: Diagnosis not present

## 2019-10-07 DIAGNOSIS — N186 End stage renal disease: Secondary | ICD-10-CM | POA: Diagnosis not present

## 2019-10-07 DIAGNOSIS — Z992 Dependence on renal dialysis: Secondary | ICD-10-CM | POA: Diagnosis not present

## 2019-10-09 DIAGNOSIS — N186 End stage renal disease: Secondary | ICD-10-CM | POA: Diagnosis not present

## 2019-10-09 DIAGNOSIS — Z992 Dependence on renal dialysis: Secondary | ICD-10-CM | POA: Diagnosis not present

## 2019-10-10 DIAGNOSIS — Z992 Dependence on renal dialysis: Secondary | ICD-10-CM | POA: Diagnosis not present

## 2019-10-10 DIAGNOSIS — N186 End stage renal disease: Secondary | ICD-10-CM | POA: Diagnosis not present

## 2019-10-11 DIAGNOSIS — Z992 Dependence on renal dialysis: Secondary | ICD-10-CM | POA: Diagnosis not present

## 2019-10-11 DIAGNOSIS — N186 End stage renal disease: Secondary | ICD-10-CM | POA: Diagnosis not present

## 2019-10-12 DIAGNOSIS — N186 End stage renal disease: Secondary | ICD-10-CM | POA: Diagnosis not present

## 2019-10-12 DIAGNOSIS — Z992 Dependence on renal dialysis: Secondary | ICD-10-CM | POA: Diagnosis not present

## 2019-10-13 DIAGNOSIS — N186 End stage renal disease: Secondary | ICD-10-CM | POA: Diagnosis not present

## 2019-10-13 DIAGNOSIS — Z992 Dependence on renal dialysis: Secondary | ICD-10-CM | POA: Diagnosis not present

## 2019-10-14 DIAGNOSIS — N186 End stage renal disease: Secondary | ICD-10-CM | POA: Diagnosis not present

## 2019-10-14 DIAGNOSIS — Z992 Dependence on renal dialysis: Secondary | ICD-10-CM | POA: Diagnosis not present

## 2019-10-16 DIAGNOSIS — N186 End stage renal disease: Secondary | ICD-10-CM | POA: Diagnosis not present

## 2019-10-16 DIAGNOSIS — Z992 Dependence on renal dialysis: Secondary | ICD-10-CM | POA: Diagnosis not present

## 2019-10-17 DIAGNOSIS — Z992 Dependence on renal dialysis: Secondary | ICD-10-CM | POA: Diagnosis not present

## 2019-10-17 DIAGNOSIS — N186 End stage renal disease: Secondary | ICD-10-CM | POA: Diagnosis not present

## 2019-10-18 DIAGNOSIS — N186 End stage renal disease: Secondary | ICD-10-CM | POA: Diagnosis not present

## 2019-10-18 DIAGNOSIS — Z992 Dependence on renal dialysis: Secondary | ICD-10-CM | POA: Diagnosis not present

## 2019-10-19 DIAGNOSIS — N186 End stage renal disease: Secondary | ICD-10-CM | POA: Diagnosis not present

## 2019-10-19 DIAGNOSIS — Z992 Dependence on renal dialysis: Secondary | ICD-10-CM | POA: Diagnosis not present

## 2019-10-20 DIAGNOSIS — Z992 Dependence on renal dialysis: Secondary | ICD-10-CM | POA: Diagnosis not present

## 2019-10-20 DIAGNOSIS — N186 End stage renal disease: Secondary | ICD-10-CM | POA: Diagnosis not present

## 2019-10-21 DIAGNOSIS — N186 End stage renal disease: Secondary | ICD-10-CM | POA: Diagnosis not present

## 2019-10-21 DIAGNOSIS — Z992 Dependence on renal dialysis: Secondary | ICD-10-CM | POA: Diagnosis not present

## 2019-10-23 DIAGNOSIS — Z992 Dependence on renal dialysis: Secondary | ICD-10-CM | POA: Diagnosis not present

## 2019-10-23 DIAGNOSIS — N186 End stage renal disease: Secondary | ICD-10-CM | POA: Diagnosis not present

## 2019-10-24 DIAGNOSIS — Z992 Dependence on renal dialysis: Secondary | ICD-10-CM | POA: Diagnosis not present

## 2019-10-24 DIAGNOSIS — N186 End stage renal disease: Secondary | ICD-10-CM | POA: Diagnosis not present

## 2019-10-25 DIAGNOSIS — N186 End stage renal disease: Secondary | ICD-10-CM | POA: Diagnosis not present

## 2019-10-25 DIAGNOSIS — Z992 Dependence on renal dialysis: Secondary | ICD-10-CM | POA: Diagnosis not present

## 2019-10-26 DIAGNOSIS — Z992 Dependence on renal dialysis: Secondary | ICD-10-CM | POA: Diagnosis not present

## 2019-10-26 DIAGNOSIS — N186 End stage renal disease: Secondary | ICD-10-CM | POA: Diagnosis not present

## 2019-10-27 DIAGNOSIS — Z992 Dependence on renal dialysis: Secondary | ICD-10-CM | POA: Diagnosis not present

## 2019-10-27 DIAGNOSIS — N186 End stage renal disease: Secondary | ICD-10-CM | POA: Diagnosis not present

## 2019-10-28 DIAGNOSIS — N186 End stage renal disease: Secondary | ICD-10-CM | POA: Diagnosis not present

## 2019-10-28 DIAGNOSIS — Z992 Dependence on renal dialysis: Secondary | ICD-10-CM | POA: Diagnosis not present

## 2019-10-30 DIAGNOSIS — N186 End stage renal disease: Secondary | ICD-10-CM | POA: Diagnosis not present

## 2019-10-30 DIAGNOSIS — Z992 Dependence on renal dialysis: Secondary | ICD-10-CM | POA: Diagnosis not present

## 2019-10-31 DIAGNOSIS — N186 End stage renal disease: Secondary | ICD-10-CM | POA: Diagnosis not present

## 2019-10-31 DIAGNOSIS — Z992 Dependence on renal dialysis: Secondary | ICD-10-CM | POA: Diagnosis not present

## 2019-11-01 DIAGNOSIS — N186 End stage renal disease: Secondary | ICD-10-CM | POA: Diagnosis not present

## 2019-11-01 DIAGNOSIS — Z992 Dependence on renal dialysis: Secondary | ICD-10-CM | POA: Diagnosis not present

## 2019-11-02 DIAGNOSIS — Z992 Dependence on renal dialysis: Secondary | ICD-10-CM | POA: Diagnosis not present

## 2019-11-02 DIAGNOSIS — N186 End stage renal disease: Secondary | ICD-10-CM | POA: Diagnosis not present

## 2019-11-03 DIAGNOSIS — E878 Other disorders of electrolyte and fluid balance, not elsewhere classified: Secondary | ICD-10-CM | POA: Diagnosis not present

## 2019-11-03 DIAGNOSIS — N186 End stage renal disease: Secondary | ICD-10-CM | POA: Diagnosis not present

## 2019-11-03 DIAGNOSIS — D509 Iron deficiency anemia, unspecified: Secondary | ICD-10-CM | POA: Diagnosis not present

## 2019-11-03 DIAGNOSIS — Z992 Dependence on renal dialysis: Secondary | ICD-10-CM | POA: Diagnosis not present

## 2019-11-04 DIAGNOSIS — N186 End stage renal disease: Secondary | ICD-10-CM | POA: Diagnosis not present

## 2019-11-04 DIAGNOSIS — Z992 Dependence on renal dialysis: Secondary | ICD-10-CM | POA: Diagnosis not present

## 2019-11-06 DIAGNOSIS — N186 End stage renal disease: Secondary | ICD-10-CM | POA: Diagnosis not present

## 2019-11-06 DIAGNOSIS — Z992 Dependence on renal dialysis: Secondary | ICD-10-CM | POA: Diagnosis not present

## 2019-11-07 DIAGNOSIS — N186 End stage renal disease: Secondary | ICD-10-CM | POA: Diagnosis not present

## 2019-11-07 DIAGNOSIS — Z992 Dependence on renal dialysis: Secondary | ICD-10-CM | POA: Diagnosis not present

## 2019-11-08 DIAGNOSIS — N186 End stage renal disease: Secondary | ICD-10-CM | POA: Diagnosis not present

## 2019-11-08 DIAGNOSIS — Z992 Dependence on renal dialysis: Secondary | ICD-10-CM | POA: Diagnosis not present

## 2019-11-09 DIAGNOSIS — N186 End stage renal disease: Secondary | ICD-10-CM | POA: Diagnosis not present

## 2019-11-09 DIAGNOSIS — Z992 Dependence on renal dialysis: Secondary | ICD-10-CM | POA: Diagnosis not present

## 2019-11-10 DIAGNOSIS — N186 End stage renal disease: Secondary | ICD-10-CM | POA: Diagnosis not present

## 2019-11-10 DIAGNOSIS — Z992 Dependence on renal dialysis: Secondary | ICD-10-CM | POA: Diagnosis not present

## 2019-11-11 DIAGNOSIS — Z992 Dependence on renal dialysis: Secondary | ICD-10-CM | POA: Diagnosis not present

## 2019-11-11 DIAGNOSIS — N186 End stage renal disease: Secondary | ICD-10-CM | POA: Diagnosis not present

## 2019-11-13 DIAGNOSIS — N186 End stage renal disease: Secondary | ICD-10-CM | POA: Diagnosis not present

## 2019-11-13 DIAGNOSIS — Z992 Dependence on renal dialysis: Secondary | ICD-10-CM | POA: Diagnosis not present

## 2019-11-14 DIAGNOSIS — Z992 Dependence on renal dialysis: Secondary | ICD-10-CM | POA: Diagnosis not present

## 2019-11-14 DIAGNOSIS — N186 End stage renal disease: Secondary | ICD-10-CM | POA: Diagnosis not present

## 2019-11-15 DIAGNOSIS — Z992 Dependence on renal dialysis: Secondary | ICD-10-CM | POA: Diagnosis not present

## 2019-11-15 DIAGNOSIS — N186 End stage renal disease: Secondary | ICD-10-CM | POA: Diagnosis not present

## 2019-11-16 DIAGNOSIS — N186 End stage renal disease: Secondary | ICD-10-CM | POA: Diagnosis not present

## 2019-11-16 DIAGNOSIS — Z992 Dependence on renal dialysis: Secondary | ICD-10-CM | POA: Diagnosis not present

## 2019-11-17 DIAGNOSIS — N186 End stage renal disease: Secondary | ICD-10-CM | POA: Diagnosis not present

## 2019-11-17 DIAGNOSIS — Z992 Dependence on renal dialysis: Secondary | ICD-10-CM | POA: Diagnosis not present

## 2019-11-18 DIAGNOSIS — N186 End stage renal disease: Secondary | ICD-10-CM | POA: Diagnosis not present

## 2019-11-18 DIAGNOSIS — Z992 Dependence on renal dialysis: Secondary | ICD-10-CM | POA: Diagnosis not present

## 2019-11-20 DIAGNOSIS — N186 End stage renal disease: Secondary | ICD-10-CM | POA: Diagnosis not present

## 2019-11-20 DIAGNOSIS — Z992 Dependence on renal dialysis: Secondary | ICD-10-CM | POA: Diagnosis not present

## 2019-11-21 DIAGNOSIS — Z79899 Other long term (current) drug therapy: Secondary | ICD-10-CM | POA: Diagnosis not present

## 2019-11-21 DIAGNOSIS — D509 Iron deficiency anemia, unspecified: Secondary | ICD-10-CM | POA: Diagnosis not present

## 2019-11-21 DIAGNOSIS — Z992 Dependence on renal dialysis: Secondary | ICD-10-CM | POA: Diagnosis not present

## 2019-11-21 DIAGNOSIS — E878 Other disorders of electrolyte and fluid balance, not elsewhere classified: Secondary | ICD-10-CM | POA: Diagnosis not present

## 2019-11-21 DIAGNOSIS — N186 End stage renal disease: Secondary | ICD-10-CM | POA: Diagnosis not present

## 2019-11-22 DIAGNOSIS — N186 End stage renal disease: Secondary | ICD-10-CM | POA: Diagnosis not present

## 2019-11-22 DIAGNOSIS — Z992 Dependence on renal dialysis: Secondary | ICD-10-CM | POA: Diagnosis not present

## 2019-11-23 DIAGNOSIS — Z992 Dependence on renal dialysis: Secondary | ICD-10-CM | POA: Diagnosis not present

## 2019-11-23 DIAGNOSIS — N186 End stage renal disease: Secondary | ICD-10-CM | POA: Diagnosis not present

## 2019-11-24 DIAGNOSIS — N186 End stage renal disease: Secondary | ICD-10-CM | POA: Diagnosis not present

## 2019-11-24 DIAGNOSIS — Z992 Dependence on renal dialysis: Secondary | ICD-10-CM | POA: Diagnosis not present

## 2019-11-25 DIAGNOSIS — Z992 Dependence on renal dialysis: Secondary | ICD-10-CM | POA: Diagnosis not present

## 2019-11-25 DIAGNOSIS — N186 End stage renal disease: Secondary | ICD-10-CM | POA: Diagnosis not present

## 2019-11-27 DIAGNOSIS — Z992 Dependence on renal dialysis: Secondary | ICD-10-CM | POA: Diagnosis not present

## 2019-11-27 DIAGNOSIS — N186 End stage renal disease: Secondary | ICD-10-CM | POA: Diagnosis not present

## 2019-11-28 DIAGNOSIS — Z992 Dependence on renal dialysis: Secondary | ICD-10-CM | POA: Diagnosis not present

## 2019-11-28 DIAGNOSIS — N186 End stage renal disease: Secondary | ICD-10-CM | POA: Diagnosis not present

## 2019-11-29 DIAGNOSIS — N186 End stage renal disease: Secondary | ICD-10-CM | POA: Diagnosis not present

## 2019-11-29 DIAGNOSIS — Z992 Dependence on renal dialysis: Secondary | ICD-10-CM | POA: Diagnosis not present

## 2019-11-30 DIAGNOSIS — N186 End stage renal disease: Secondary | ICD-10-CM | POA: Diagnosis not present

## 2019-11-30 DIAGNOSIS — Z992 Dependence on renal dialysis: Secondary | ICD-10-CM | POA: Diagnosis not present

## 2019-12-01 DIAGNOSIS — N186 End stage renal disease: Secondary | ICD-10-CM | POA: Diagnosis not present

## 2019-12-01 DIAGNOSIS — Z992 Dependence on renal dialysis: Secondary | ICD-10-CM | POA: Diagnosis not present

## 2019-12-02 DIAGNOSIS — N186 End stage renal disease: Secondary | ICD-10-CM | POA: Diagnosis not present

## 2019-12-02 DIAGNOSIS — Z992 Dependence on renal dialysis: Secondary | ICD-10-CM | POA: Diagnosis not present

## 2019-12-04 DIAGNOSIS — N186 End stage renal disease: Secondary | ICD-10-CM | POA: Diagnosis not present

## 2019-12-04 DIAGNOSIS — Z992 Dependence on renal dialysis: Secondary | ICD-10-CM | POA: Diagnosis not present

## 2019-12-05 DIAGNOSIS — Z992 Dependence on renal dialysis: Secondary | ICD-10-CM | POA: Diagnosis not present

## 2019-12-05 DIAGNOSIS — N186 End stage renal disease: Secondary | ICD-10-CM | POA: Diagnosis not present

## 2019-12-06 DIAGNOSIS — Z992 Dependence on renal dialysis: Secondary | ICD-10-CM | POA: Diagnosis not present

## 2019-12-06 DIAGNOSIS — N186 End stage renal disease: Secondary | ICD-10-CM | POA: Diagnosis not present

## 2019-12-07 DIAGNOSIS — N186 End stage renal disease: Secondary | ICD-10-CM | POA: Diagnosis not present

## 2019-12-07 DIAGNOSIS — Z992 Dependence on renal dialysis: Secondary | ICD-10-CM | POA: Diagnosis not present

## 2019-12-08 DIAGNOSIS — N186 End stage renal disease: Secondary | ICD-10-CM | POA: Diagnosis not present

## 2019-12-08 DIAGNOSIS — Z992 Dependence on renal dialysis: Secondary | ICD-10-CM | POA: Diagnosis not present

## 2019-12-09 DIAGNOSIS — Z992 Dependence on renal dialysis: Secondary | ICD-10-CM | POA: Diagnosis not present

## 2019-12-09 DIAGNOSIS — N186 End stage renal disease: Secondary | ICD-10-CM | POA: Diagnosis not present

## 2019-12-11 DIAGNOSIS — N186 End stage renal disease: Secondary | ICD-10-CM | POA: Diagnosis not present

## 2019-12-11 DIAGNOSIS — Z992 Dependence on renal dialysis: Secondary | ICD-10-CM | POA: Diagnosis not present

## 2019-12-12 DIAGNOSIS — N186 End stage renal disease: Secondary | ICD-10-CM | POA: Diagnosis not present

## 2019-12-12 DIAGNOSIS — Z992 Dependence on renal dialysis: Secondary | ICD-10-CM | POA: Diagnosis not present

## 2019-12-13 DIAGNOSIS — Z992 Dependence on renal dialysis: Secondary | ICD-10-CM | POA: Diagnosis not present

## 2019-12-13 DIAGNOSIS — N186 End stage renal disease: Secondary | ICD-10-CM | POA: Diagnosis not present

## 2019-12-14 DIAGNOSIS — Z992 Dependence on renal dialysis: Secondary | ICD-10-CM | POA: Diagnosis not present

## 2019-12-14 DIAGNOSIS — N186 End stage renal disease: Secondary | ICD-10-CM | POA: Diagnosis not present

## 2019-12-15 DIAGNOSIS — N186 End stage renal disease: Secondary | ICD-10-CM | POA: Diagnosis not present

## 2019-12-15 DIAGNOSIS — Z992 Dependence on renal dialysis: Secondary | ICD-10-CM | POA: Diagnosis not present

## 2019-12-16 DIAGNOSIS — Z992 Dependence on renal dialysis: Secondary | ICD-10-CM | POA: Diagnosis not present

## 2019-12-16 DIAGNOSIS — N186 End stage renal disease: Secondary | ICD-10-CM | POA: Diagnosis not present

## 2019-12-18 DIAGNOSIS — Z992 Dependence on renal dialysis: Secondary | ICD-10-CM | POA: Diagnosis not present

## 2019-12-18 DIAGNOSIS — N186 End stage renal disease: Secondary | ICD-10-CM | POA: Diagnosis not present

## 2019-12-19 DIAGNOSIS — Z992 Dependence on renal dialysis: Secondary | ICD-10-CM | POA: Diagnosis not present

## 2019-12-19 DIAGNOSIS — N186 End stage renal disease: Secondary | ICD-10-CM | POA: Diagnosis not present

## 2019-12-20 DIAGNOSIS — Z992 Dependence on renal dialysis: Secondary | ICD-10-CM | POA: Diagnosis not present

## 2019-12-20 DIAGNOSIS — N186 End stage renal disease: Secondary | ICD-10-CM | POA: Diagnosis not present

## 2019-12-21 DIAGNOSIS — N186 End stage renal disease: Secondary | ICD-10-CM | POA: Diagnosis not present

## 2019-12-21 DIAGNOSIS — Z992 Dependence on renal dialysis: Secondary | ICD-10-CM | POA: Diagnosis not present

## 2019-12-22 DIAGNOSIS — Z992 Dependence on renal dialysis: Secondary | ICD-10-CM | POA: Diagnosis not present

## 2019-12-22 DIAGNOSIS — N186 End stage renal disease: Secondary | ICD-10-CM | POA: Diagnosis not present

## 2019-12-23 DIAGNOSIS — Z992 Dependence on renal dialysis: Secondary | ICD-10-CM | POA: Diagnosis not present

## 2019-12-23 DIAGNOSIS — N186 End stage renal disease: Secondary | ICD-10-CM | POA: Diagnosis not present

## 2019-12-25 DIAGNOSIS — Z992 Dependence on renal dialysis: Secondary | ICD-10-CM | POA: Diagnosis not present

## 2019-12-25 DIAGNOSIS — N186 End stage renal disease: Secondary | ICD-10-CM | POA: Diagnosis not present

## 2019-12-26 DIAGNOSIS — D509 Iron deficiency anemia, unspecified: Secondary | ICD-10-CM | POA: Diagnosis not present

## 2019-12-26 DIAGNOSIS — Z992 Dependence on renal dialysis: Secondary | ICD-10-CM | POA: Diagnosis not present

## 2019-12-26 DIAGNOSIS — E878 Other disorders of electrolyte and fluid balance, not elsewhere classified: Secondary | ICD-10-CM | POA: Diagnosis not present

## 2019-12-26 DIAGNOSIS — N186 End stage renal disease: Secondary | ICD-10-CM | POA: Diagnosis not present

## 2019-12-27 DIAGNOSIS — Z992 Dependence on renal dialysis: Secondary | ICD-10-CM | POA: Diagnosis not present

## 2019-12-27 DIAGNOSIS — N186 End stage renal disease: Secondary | ICD-10-CM | POA: Diagnosis not present

## 2019-12-28 DIAGNOSIS — Z992 Dependence on renal dialysis: Secondary | ICD-10-CM | POA: Diagnosis not present

## 2019-12-28 DIAGNOSIS — N186 End stage renal disease: Secondary | ICD-10-CM | POA: Diagnosis not present

## 2019-12-29 DIAGNOSIS — N186 End stage renal disease: Secondary | ICD-10-CM | POA: Diagnosis not present

## 2019-12-29 DIAGNOSIS — Z992 Dependence on renal dialysis: Secondary | ICD-10-CM | POA: Diagnosis not present

## 2019-12-30 DIAGNOSIS — Z992 Dependence on renal dialysis: Secondary | ICD-10-CM | POA: Diagnosis not present

## 2019-12-30 DIAGNOSIS — N186 End stage renal disease: Secondary | ICD-10-CM | POA: Diagnosis not present

## 2020-01-01 DIAGNOSIS — N186 End stage renal disease: Secondary | ICD-10-CM | POA: Diagnosis not present

## 2020-01-01 DIAGNOSIS — Z992 Dependence on renal dialysis: Secondary | ICD-10-CM | POA: Diagnosis not present

## 2020-01-02 DIAGNOSIS — Z01812 Encounter for preprocedural laboratory examination: Secondary | ICD-10-CM | POA: Diagnosis not present

## 2020-01-02 DIAGNOSIS — N186 End stage renal disease: Secondary | ICD-10-CM | POA: Diagnosis not present

## 2020-01-02 DIAGNOSIS — Z20822 Contact with and (suspected) exposure to covid-19: Secondary | ICD-10-CM | POA: Diagnosis not present

## 2020-01-02 DIAGNOSIS — Z992 Dependence on renal dialysis: Secondary | ICD-10-CM | POA: Diagnosis not present

## 2020-01-03 DIAGNOSIS — N186 End stage renal disease: Secondary | ICD-10-CM | POA: Diagnosis not present

## 2020-01-03 DIAGNOSIS — Z992 Dependence on renal dialysis: Secondary | ICD-10-CM | POA: Diagnosis not present

## 2020-01-04 DIAGNOSIS — Z87891 Personal history of nicotine dependence: Secondary | ICD-10-CM | POA: Diagnosis not present

## 2020-01-04 DIAGNOSIS — Z880 Allergy status to penicillin: Secondary | ICD-10-CM | POA: Diagnosis not present

## 2020-01-04 DIAGNOSIS — N2889 Other specified disorders of kidney and ureter: Secondary | ICD-10-CM | POA: Diagnosis not present

## 2020-01-04 DIAGNOSIS — N186 End stage renal disease: Secondary | ICD-10-CM | POA: Diagnosis not present

## 2020-01-04 DIAGNOSIS — E785 Hyperlipidemia, unspecified: Secondary | ICD-10-CM | POA: Diagnosis not present

## 2020-01-04 DIAGNOSIS — D4959 Neoplasm of unspecified behavior of other genitourinary organ: Secondary | ICD-10-CM | POA: Diagnosis not present

## 2020-01-04 DIAGNOSIS — I120 Hypertensive chronic kidney disease with stage 5 chronic kidney disease or end stage renal disease: Secondary | ICD-10-CM | POA: Diagnosis not present

## 2020-01-04 DIAGNOSIS — D413 Neoplasm of uncertain behavior of urethra: Secondary | ICD-10-CM | POA: Diagnosis not present

## 2020-01-04 DIAGNOSIS — Z992 Dependence on renal dialysis: Secondary | ICD-10-CM | POA: Diagnosis not present

## 2020-01-05 DIAGNOSIS — Z992 Dependence on renal dialysis: Secondary | ICD-10-CM | POA: Diagnosis not present

## 2020-01-05 DIAGNOSIS — N186 End stage renal disease: Secondary | ICD-10-CM | POA: Diagnosis not present

## 2020-01-06 DIAGNOSIS — N186 End stage renal disease: Secondary | ICD-10-CM | POA: Diagnosis not present

## 2020-01-06 DIAGNOSIS — Z992 Dependence on renal dialysis: Secondary | ICD-10-CM | POA: Diagnosis not present

## 2020-01-08 DIAGNOSIS — Z992 Dependence on renal dialysis: Secondary | ICD-10-CM | POA: Diagnosis not present

## 2020-01-08 DIAGNOSIS — N186 End stage renal disease: Secondary | ICD-10-CM | POA: Diagnosis not present

## 2020-01-09 DIAGNOSIS — N186 End stage renal disease: Secondary | ICD-10-CM | POA: Diagnosis not present

## 2020-01-09 DIAGNOSIS — Z992 Dependence on renal dialysis: Secondary | ICD-10-CM | POA: Diagnosis not present

## 2020-01-10 DIAGNOSIS — Z992 Dependence on renal dialysis: Secondary | ICD-10-CM | POA: Diagnosis not present

## 2020-01-10 DIAGNOSIS — N186 End stage renal disease: Secondary | ICD-10-CM | POA: Diagnosis not present

## 2020-01-11 ENCOUNTER — Telehealth: Payer: Self-pay

## 2020-01-11 DIAGNOSIS — N186 End stage renal disease: Secondary | ICD-10-CM | POA: Diagnosis not present

## 2020-01-11 DIAGNOSIS — Z992 Dependence on renal dialysis: Secondary | ICD-10-CM | POA: Diagnosis not present

## 2020-01-11 NOTE — Telephone Encounter (Signed)
Received office notes from [place] from [facility], [place] dated 01/02/20. Phone:  [phone]  Office notes reviewed by Gerarda Fraction, NP-C on 01/11/20:  "Reviewed 01/11/20 5/5 pages.  Covid testing preprocedure negative per Epic review.  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  No new ureteral tumor seen & does not need to continue surveillance."  AMD

## 2020-01-22 DIAGNOSIS — N186 End stage renal disease: Secondary | ICD-10-CM | POA: Diagnosis not present

## 2020-01-22 DIAGNOSIS — Z992 Dependence on renal dialysis: Secondary | ICD-10-CM | POA: Diagnosis not present

## 2020-01-23 DIAGNOSIS — N186 End stage renal disease: Secondary | ICD-10-CM | POA: Diagnosis not present

## 2020-01-23 DIAGNOSIS — Z992 Dependence on renal dialysis: Secondary | ICD-10-CM | POA: Diagnosis not present

## 2020-01-24 DIAGNOSIS — N186 End stage renal disease: Secondary | ICD-10-CM | POA: Diagnosis not present

## 2020-01-24 DIAGNOSIS — Z992 Dependence on renal dialysis: Secondary | ICD-10-CM | POA: Diagnosis not present

## 2020-01-25 DIAGNOSIS — N186 End stage renal disease: Secondary | ICD-10-CM | POA: Diagnosis not present

## 2020-01-25 DIAGNOSIS — Z992 Dependence on renal dialysis: Secondary | ICD-10-CM | POA: Diagnosis not present

## 2020-01-26 DIAGNOSIS — N186 End stage renal disease: Secondary | ICD-10-CM | POA: Diagnosis not present

## 2020-01-26 DIAGNOSIS — Z992 Dependence on renal dialysis: Secondary | ICD-10-CM | POA: Diagnosis not present

## 2020-01-27 DIAGNOSIS — N186 End stage renal disease: Secondary | ICD-10-CM | POA: Diagnosis not present

## 2020-01-27 DIAGNOSIS — Z992 Dependence on renal dialysis: Secondary | ICD-10-CM | POA: Diagnosis not present

## 2020-01-29 DIAGNOSIS — Z992 Dependence on renal dialysis: Secondary | ICD-10-CM | POA: Diagnosis not present

## 2020-01-29 DIAGNOSIS — N186 End stage renal disease: Secondary | ICD-10-CM | POA: Diagnosis not present

## 2020-01-30 DIAGNOSIS — Z992 Dependence on renal dialysis: Secondary | ICD-10-CM | POA: Diagnosis not present

## 2020-01-30 DIAGNOSIS — N186 End stage renal disease: Secondary | ICD-10-CM | POA: Diagnosis not present

## 2020-01-31 DIAGNOSIS — Z992 Dependence on renal dialysis: Secondary | ICD-10-CM | POA: Diagnosis not present

## 2020-01-31 DIAGNOSIS — N186 End stage renal disease: Secondary | ICD-10-CM | POA: Diagnosis not present

## 2020-02-01 DIAGNOSIS — Z992 Dependence on renal dialysis: Secondary | ICD-10-CM | POA: Diagnosis not present

## 2020-02-01 DIAGNOSIS — N186 End stage renal disease: Secondary | ICD-10-CM | POA: Diagnosis not present

## 2020-02-02 DIAGNOSIS — N186 End stage renal disease: Secondary | ICD-10-CM | POA: Diagnosis not present

## 2020-02-02 DIAGNOSIS — Z992 Dependence on renal dialysis: Secondary | ICD-10-CM | POA: Diagnosis not present

## 2020-02-03 DIAGNOSIS — N186 End stage renal disease: Secondary | ICD-10-CM | POA: Diagnosis not present

## 2020-02-03 DIAGNOSIS — Z992 Dependence on renal dialysis: Secondary | ICD-10-CM | POA: Diagnosis not present

## 2020-02-05 DIAGNOSIS — N186 End stage renal disease: Secondary | ICD-10-CM | POA: Diagnosis not present

## 2020-02-05 DIAGNOSIS — Z992 Dependence on renal dialysis: Secondary | ICD-10-CM | POA: Diagnosis not present

## 2020-02-06 DIAGNOSIS — Z992 Dependence on renal dialysis: Secondary | ICD-10-CM | POA: Diagnosis not present

## 2020-02-06 DIAGNOSIS — N186 End stage renal disease: Secondary | ICD-10-CM | POA: Diagnosis not present

## 2020-02-07 DIAGNOSIS — Z992 Dependence on renal dialysis: Secondary | ICD-10-CM | POA: Diagnosis not present

## 2020-02-07 DIAGNOSIS — N186 End stage renal disease: Secondary | ICD-10-CM | POA: Diagnosis not present

## 2020-02-08 DIAGNOSIS — N186 End stage renal disease: Secondary | ICD-10-CM | POA: Diagnosis not present

## 2020-02-08 DIAGNOSIS — Z992 Dependence on renal dialysis: Secondary | ICD-10-CM | POA: Diagnosis not present

## 2020-02-09 DIAGNOSIS — N186 End stage renal disease: Secondary | ICD-10-CM | POA: Diagnosis not present

## 2020-02-09 DIAGNOSIS — Z992 Dependence on renal dialysis: Secondary | ICD-10-CM | POA: Diagnosis not present

## 2020-02-10 DIAGNOSIS — N186 End stage renal disease: Secondary | ICD-10-CM | POA: Diagnosis not present

## 2020-02-10 DIAGNOSIS — Z992 Dependence on renal dialysis: Secondary | ICD-10-CM | POA: Diagnosis not present

## 2020-02-12 DIAGNOSIS — Z992 Dependence on renal dialysis: Secondary | ICD-10-CM | POA: Diagnosis not present

## 2020-02-12 DIAGNOSIS — N186 End stage renal disease: Secondary | ICD-10-CM | POA: Diagnosis not present

## 2020-02-13 DIAGNOSIS — Z992 Dependence on renal dialysis: Secondary | ICD-10-CM | POA: Diagnosis not present

## 2020-02-13 DIAGNOSIS — N186 End stage renal disease: Secondary | ICD-10-CM | POA: Diagnosis not present

## 2020-02-14 DIAGNOSIS — N186 End stage renal disease: Secondary | ICD-10-CM | POA: Diagnosis not present

## 2020-02-14 DIAGNOSIS — Z992 Dependence on renal dialysis: Secondary | ICD-10-CM | POA: Diagnosis not present

## 2020-02-15 DIAGNOSIS — Z992 Dependence on renal dialysis: Secondary | ICD-10-CM | POA: Diagnosis not present

## 2020-02-15 DIAGNOSIS — N186 End stage renal disease: Secondary | ICD-10-CM | POA: Diagnosis not present

## 2020-02-16 DIAGNOSIS — N186 End stage renal disease: Secondary | ICD-10-CM | POA: Diagnosis not present

## 2020-02-16 DIAGNOSIS — Z992 Dependence on renal dialysis: Secondary | ICD-10-CM | POA: Diagnosis not present

## 2020-02-17 DIAGNOSIS — N186 End stage renal disease: Secondary | ICD-10-CM | POA: Diagnosis not present

## 2020-02-17 DIAGNOSIS — Z992 Dependence on renal dialysis: Secondary | ICD-10-CM | POA: Diagnosis not present

## 2020-02-19 DIAGNOSIS — N186 End stage renal disease: Secondary | ICD-10-CM | POA: Diagnosis not present

## 2020-02-19 DIAGNOSIS — Z992 Dependence on renal dialysis: Secondary | ICD-10-CM | POA: Diagnosis not present

## 2020-02-20 DIAGNOSIS — N186 End stage renal disease: Secondary | ICD-10-CM | POA: Diagnosis not present

## 2020-02-20 DIAGNOSIS — Z992 Dependence on renal dialysis: Secondary | ICD-10-CM | POA: Diagnosis not present

## 2020-02-21 DIAGNOSIS — N186 End stage renal disease: Secondary | ICD-10-CM | POA: Diagnosis not present

## 2020-02-21 DIAGNOSIS — Z992 Dependence on renal dialysis: Secondary | ICD-10-CM | POA: Diagnosis not present

## 2020-02-22 DIAGNOSIS — Z992 Dependence on renal dialysis: Secondary | ICD-10-CM | POA: Diagnosis not present

## 2020-02-22 DIAGNOSIS — N186 End stage renal disease: Secondary | ICD-10-CM | POA: Diagnosis not present

## 2020-02-23 DIAGNOSIS — Z992 Dependence on renal dialysis: Secondary | ICD-10-CM | POA: Diagnosis not present

## 2020-02-23 DIAGNOSIS — N186 End stage renal disease: Secondary | ICD-10-CM | POA: Diagnosis not present

## 2020-02-24 ENCOUNTER — Emergency Department: Payer: Medicare Other

## 2020-02-24 ENCOUNTER — Emergency Department
Admission: EM | Admit: 2020-02-24 | Discharge: 2020-02-24 | Disposition: A | Discharge status: Home / Self Care | Payer: Medicare Other | Attending: Emergency Medicine | Admitting: Emergency Medicine

## 2020-02-24 ENCOUNTER — Other Ambulatory Visit: Payer: Self-pay

## 2020-02-24 DIAGNOSIS — S62232B Other displaced fracture of base of first metacarpal bone, left hand, initial encounter for open fracture: Secondary | ICD-10-CM | POA: Insufficient documentation

## 2020-02-24 DIAGNOSIS — N186 End stage renal disease: Secondary | ICD-10-CM | POA: Diagnosis not present

## 2020-02-24 DIAGNOSIS — M25532 Pain in left wrist: Secondary | ICD-10-CM | POA: Insufficient documentation

## 2020-02-24 DIAGNOSIS — Y9389 Activity, other specified: Secondary | ICD-10-CM | POA: Diagnosis not present

## 2020-02-24 DIAGNOSIS — Y999 Unspecified external cause status: Secondary | ICD-10-CM | POA: Insufficient documentation

## 2020-02-24 DIAGNOSIS — S6992XA Unspecified injury of left wrist, hand and finger(s), initial encounter: Secondary | ICD-10-CM | POA: Diagnosis present

## 2020-02-24 DIAGNOSIS — S61412A Laceration without foreign body of left hand, initial encounter: Secondary | ICD-10-CM | POA: Diagnosis not present

## 2020-02-24 DIAGNOSIS — W28XXXA Contact with powered lawn mower, initial encounter: Secondary | ICD-10-CM | POA: Insufficient documentation

## 2020-02-24 DIAGNOSIS — Y929 Unspecified place or not applicable: Secondary | ICD-10-CM | POA: Diagnosis not present

## 2020-02-24 DIAGNOSIS — S62232A Other displaced fracture of base of first metacarpal bone, left hand, initial encounter for closed fracture: Secondary | ICD-10-CM

## 2020-02-24 DIAGNOSIS — Z992 Dependence on renal dialysis: Secondary | ICD-10-CM | POA: Diagnosis not present

## 2020-02-24 MED ORDER — CEPHALEXIN 500 MG PO CAPS: 500.0000 mg | ORAL_CAPSULE | Freq: Three times a day (TID) | ORAL | 0 refills | Status: AC

## 2020-02-24 MED ORDER — OXYCODONE-ACETAMINOPHEN 5-325 MG PO TABS
1.0000 | ORAL_TABLET | Freq: Once | ORAL | Status: AC
  Administered 2020-02-24: 1 via ORAL
  Filled 2020-02-24: qty 1

## 2020-02-24 MED ORDER — LIDOCAINE HCL (PF) 1 % IJ SOLN
INTRAMUSCULAR | Status: AC
  Filled 2020-02-24: qty 5

## 2020-02-24 MED ORDER — ONDANSETRON 4 MG PO TBDP
4.0000 mg | ORAL_TABLET | Freq: Once | ORAL | Status: AC
  Administered 2020-02-24: 4 mg via ORAL
  Filled 2020-02-24: qty 1

## 2020-02-24 MED ORDER — LIDOCAINE HCL 1 % IJ SOLN
5.0000 mL | Freq: Once | INTRAMUSCULAR | Status: DC
  Filled 2020-02-24: qty 10

## 2020-02-24 MED ORDER — ONDANSETRON HCL 4 MG PO TABS: 4.0000 mg | ORAL_TABLET | Freq: Three times a day (TID) | ORAL | 0 refills | Status: AC | PRN

## 2020-02-24 MED ORDER — OXYCODONE-ACETAMINOPHEN 5-325 MG PO TABS: 1.0000 | ORAL_TABLET | Freq: Four times a day (QID) | ORAL | 0 refills | Status: AC | PRN

## 2020-02-24 NOTE — ED Triage Notes (Signed)
Pt arrives via POV for reports of hitting his left hand with the cord of a push lawn mower while trying to crank it. Pt states he is worried that the hand is broken. Pt can move fingers of left hand but states it is very painful. Pt has laceration to the left wrist area, bleeding controlled.

## 2020-02-24 NOTE — ED Provider Notes (Signed)
Emergency Department Provider Note  ____________________________________________  Time seen: Approximately 8:56 PM  I have reviewed the triage vital signs and the nursing notes.   HISTORY  Chief Complaint Laceration   Historian Patient    HPI Larry [name] is a 54 y.o. male presents to the emergency department with left thumb pain and left wrist pain.  Patient states that he was cranking a lawnmower and the lawnmower jerked awkwardly and struck his hand.  Patient has had decreased range of motion of left thumb since injury occurred.  No numbness or tingling.  Tetanus status is up-to-date.   Past Medical History:  Diagnosis Date  . Anemia   . Chronic kidney disease   . Hypertension   . Nephrotic syndrome      Immunizations up to date:  Yes.     Past Medical History:  Diagnosis Date  . Anemia   . Chronic kidney disease   . Hypertension   . Nephrotic syndrome     Patient Active Problem List   Diagnosis Date Noted  . Stage 5 chronic kidney disease not on chronic dialysis ([place]) 12/28/2017  . Hydronephrosis 10/15/2017  . AKI (acute kidney injury) ([place]) 09/30/2017  . even 1 per today and did not I would not with an order 02/07/2015    Past Surgical History:  Procedure Laterality Date  . CAPD INSERTION N/A 01/06/2018   Procedure: LAPAROSCOPIC INSERTION CONTINUOUS AMBULATORY PERITONEAL DIALYSIS  (CAPD) CATHETER;  Surgeon: Katha Cabal, MD;  Location: [facility] ORS;  Service: Vascular;  Laterality: N/A;  . CHOLECYSTECTOMY    . CYSTOSCOPY W/ URETERAL STENT REMOVAL Right 10/23/2017   Procedure: CYSTOSCOPY WITH STENT REPLACEMENT;  Surgeon: [name], MD;  Location: [facility] ORS;  Service: Urology;  Laterality: Right;  . CYSTOSCOPY WITH STENT PLACEMENT Right 09/30/2017   Procedure: CYSTOSCOPY WITH STENT PLACEMENT;  Surgeon: [name], MD;  Location: [facility] ORS;  Service: Urology;  Laterality: Right;  . CYSTOSCOPY/RETROGRADE/URETEROSCOPY Right 10/23/2017   Procedure: CYSTOSCOPY/RETROGRADE/URETEROSCOPY;  Surgeon: [name], MD;  Location: [facility] ORS;  Service: Urology;  Laterality: Right;  . RENAL BIOPSY      Prior to Admission medications   Medication Sig Start Date End Date Taking? Authorizing Provider  cephALEXin (KEFLEX) 500 MG capsule Take 1 capsule (500 mg total) by mouth 3 (three) times daily for 7 days. 02/24/20 03/02/20  Lannie Fields, PA-C  famotidine (PEPCID) 20 MG tablet Take 1 tablet (20 mg total) by mouth 2 (two) times daily. Patient taking differently: Take 20 mg by mouth daily as needed for heartburn.  09/27/17 09/27/18  Darel Hong, MD  hydrALAZINE (APRESOLINE) 50 MG tablet Take 50 mg by mouth daily as needed (If diastolic bp is 90 or above).     [provider]  HYDROcodone-acetaminophen (NORCO) 5-325 MG tablet Take 1-2 tablets by mouth every 6 (six) hours as needed. Patient not taking: Reported on 01/21/2018 01/06/18   Schnier, Dolores Lory, MD  ibuprofen (ADVIL,MOTRIN) 200 MG tablet Take 400 mg by mouth daily as needed for headache or moderate pain.    [provider]  metoprolol succinate (TOPROL-XL) 100 MG 24 hr tablet Take 100 mg by mouth daily.    [provider]  nicotine (NICODERM CQ - DOSED IN MG/24 HOURS) 14 mg/24hr patch Place 14 mg onto the skin daily.    [provider]  ondansetron (ZOFRAN) 4 MG tablet Take 1 tablet (4 mg total) by mouth every 8 (eight) hours as needed for up to  4 days for nausea or vomiting. 02/24/20 02/28/20  Lannie Fields, PA-C  oxybutynin (DITROPAN) 5 MG tablet Take by mouth. 01/18/18   [provider]  oxyCODONE-acetaminophen (PERCOCET/ROXICET) 5-325 MG tablet Take 1 tablet by mouth every 6 (six) hours as needed for up to 3 days. 02/24/20 02/27/20  Lannie Fields, PA-C  sertraline (ZOLOFT) 25 MG tablet Take 25 mg by mouth daily.  09/25/17   [provider]  sodium bicarbonate 650 MG tablet Take 1,300 mg by mouth 2 (two) times daily.     [provider]    Allergies Mushroom extract complex and Amoxicillin  Family History  Problem Relation Age of Onset  . Diabetes Mother   . Kidney disease Mother        mother on dialysis  . CAD Mother   . Diabetes Sister   . CAD Sister     Social History Social History   Tobacco Use  . Smoking status: Former Smoker    Years: 20.00    Types: Cigarettes    Quit date: 08/22/2017    Years since quitting: 2.5  . Smokeless tobacco: Never Used  Substance Use Topics  . Alcohol use: Yes    Comment: occ. alcohol use  . Drug use: No     Review of Systems  Constitutional: No fever/chills Eyes:  No discharge ENT: No upper respiratory complaints. Respiratory: no cough. No SOB/ use of accessory muscles to breath Gastrointestinal:   No nausea, no vomiting.  No diarrhea.  No constipation. Musculoskeletal: Patient has left hand pain.  Skin: Negative for rash, abrasions, lacerations, ecchymosis.    ____________________________________________   PHYSICAL EXAM:  VITAL SIGNS: ED Triage Vitals  Enc Vitals Group     BP 02/24/20 1839 (!) 198/103     Pulse Rate 02/24/20 1839 76     Resp 02/24/20 1839 18     Temp 02/24/20 1839 98.4 F (36.9 C)     Temp Source 02/24/20 1839 Oral     SpO2 02/24/20 1839 99 %     Weight 02/24/20 1833 137 lb (62.1 kg)     Height 02/24/20 1833 5\' 6"  (1.676 m)     Head Circumference --      Peak Flow --      Pain Score 02/24/20 1832 8     Pain Loc --      Pain Edu? --      Excl. in [place]? --      Constitutional: Alert and oriented. Well appearing and in no acute distress. Eyes: Conjunctivae are normal. PERRL. EOMI. Head: Atraumatic. Cardiovascular: Normal rate, regular rhythm. Normal S1 and S2.  Good peripheral circulation. Respiratory: Normal respiratory effort without tachypnea or retractions. Lungs CTAB. Good air entry to the bases with no decreased or absent breath sounds Gastrointestinal: Bowel sounds x 4 quadrants. Soft and nontender to  palpation. No guarding or rigidity. No distention. Musculoskeletal: Patient has deformity visualized at left thumb which was improved after reduction.  Palpable radial pulse on the left.  Capillary refill less than 2 seconds on the left. Neurologic:  Normal for age. No gross focal neurologic deficits are appreciated.  Skin: Patient has a 1 and half centimeter superficial laceration along the base of the left thumb. Psychiatric: Mood and affect are normal for age. Speech and behavior are normal.   ____________________________________________   LABS (all labs ordered are listed, but only abnormal results are displayed)  Labs Reviewed - No data to display ____________________________________________  EKG  ____________________________________________  [place], personally viewed and evaluated these images (plain radiographs) as part of my medical decision making, as well as reviewing the written report by the radiologist.  DG Wrist Complete Left  Result Date: 02/24/2020 CLINICAL DATA:  Left hand and wrist pain after injury. EXAM: LEFT WRIST - COMPLETE 3+ VIEW COMPARISON:  Hand radiographs performed concurrently. FINDINGS: Displaced thumb metacarpal fracture with mild apex ulnar angulation. No additional fracture of the wrist. Normal alignment and joint spaces. Mild degenerative carpal bone cysts. Soft tissue edema at the thumb fracture site. IMPRESSION: Displaced thumb metacarpal fracture with mild apex ulnar angulation. No additional fracture of the wrist. Electronically Signed   By: Keith Rake M.D.   On: 02/24/2020 19:27   DG Hand Complete Left  Result Date: 02/24/2020 CLINICAL DATA:  Left hand pain after injury. Struck hand on corner of a push on more. Pain and thumb. EXAM: LEFT HAND - COMPLETE 3+ VIEW COMPARISON:  None. FINDINGS: Transverse displaced fracture of the thumb metacarpal with mild apex ulnar angulation. No intra-articular extension. No additional acute  fracture of the hand. There is a remote fracture of the fifth metacarpal. Joint spaces and alignment are maintained. IMPRESSION: Transverse displaced fracture of the thumb metacarpal with mild apex ulnar angulation. Electronically Signed   By: Keith Rake M.D.   On: 02/24/2020 19:21    ____________________________________________    PROCEDURES  Procedure(s) performed:     Marland KitchenMarland KitchenLaceration Repair  Date/Time: 02/24/2020 9:03 PM Performed by: Lannie Fields, PA-C Authorized by: Lannie Fields, PA-C   Consent:    Consent obtained:  Verbal   Consent given by:  Patient Anesthesia (see MAR for exact dosages):    Anesthesia method:  None Laceration details:    Location:  Hand   Length (cm):  1.5 Repair type:    Repair type:  Simple Exploration:    Contaminated: no   Treatment:    Amount of cleaning:  Standard   Irrigation solution:  Sterile saline   Visualized foreign bodies/material removed: no   Skin repair:    Repair method:  Tissue adhesive Approximation:    Approximation:  Close Post-procedure details:    Dressing:  Open (no dressing)   Patient tolerance of procedure:  Tolerated well, no immediate complications       Medications  lidocaine (XYLOCAINE) 1 % (with pres) injection 5 mL (has no administration in time range)  lidocaine (PF) (XYLOCAINE) 1 % injection (has no administration in time range)  oxyCODONE-acetaminophen (PERCOCET/ROXICET) 5-325 MG per tablet 1 tablet (has no administration in time range)  oxyCODONE-acetaminophen (PERCOCET/ROXICET) 5-325 MG per tablet 1 tablet (1 tablet Oral Given 02/24/20 1908)  ondansetron (ZOFRAN-ODT) disintegrating tablet 4 mg (4 mg Oral Given 02/24/20 1908)     ____________________________________________   INITIAL IMPRESSION / ASSESSMENT AND PLAN / ED COURSE  Pertinent labs & imaging results that were available during my care of the patient were reviewed by me and considered in my medical decision making (see chart for  details).      Assessment and plan Metacarpal fracture Open fracture  54 year old male presents to the emergency department with acute left thumb and left wrist pain.  See HPI for more details.  Patient was hypertensive at triage but vital signs were otherwise reassuring.  On physical exam, patient had deformity at base of left thumb with overlying superficial laceration.  X-ray examination of the left wrist and left hand revealed a displaced and angulated left metacarpal fracture.  Given laceration, and concern for open fracture.  Consulted orthopedist on-call, Dr. Roland Rack.  Very much appreciate consult.  Dr. Roland Rack recommended reduction in the emergency department which occurred without complication using traction.  Patient was placed in a thumb spica splint.  Prior to splint placement, Dermabond was placed over laceration at base of left thumb.  Percocet was given in the emergency department for pain.  He was advised to follow-up with Dr. Roland Rack this week.  Return precautions were given to return to the emergency department with worsening pain or changes in sensation of the left thumb.  He voiced understanding and has easy access to the ED. ____________________________________________  FINAL CLINICAL IMPRESSION(S) / ED DIAGNOSES  Final diagnoses:  Closed displaced fracture of base of first metacarpal bone of left hand, unspecified fracture morphology, initial encounter      NEW MEDICATIONS STARTED DURING THIS VISIT:  ED Discharge Orders         Ordered    oxyCODONE-acetaminophen (PERCOCET/ROXICET) 5-325 MG tablet  Every 6 hours PRN     02/24/20 2048    ondansetron (ZOFRAN) 4 MG tablet  Every 8 hours PRN     02/24/20 2048    cephALEXin (KEFLEX) 500 MG capsule  3 times daily     02/24/20 2048              This chart was dictated using voice recognition software/Dragon. Despite best efforts to proofread, errors can occur which can change the meaning. Any change was purely  unintentional.     Lannie Fields, PA-C 02/24/20 2105    Nance Pear, MD 02/25/20 [phone]

## 2020-02-24 NOTE — ED Notes (Signed)
See triage note: pt has laceration on left hand and believes it might be broken after a lawn mower cord hit him in the hand as he was trying to crank it. Pt alert & oriented, nad noted.

## 2020-02-26 DIAGNOSIS — N186 End stage renal disease: Secondary | ICD-10-CM | POA: Diagnosis not present

## 2020-02-26 DIAGNOSIS — Z992 Dependence on renal dialysis: Secondary | ICD-10-CM | POA: Diagnosis not present

## 2020-02-27 DIAGNOSIS — N186 End stage renal disease: Secondary | ICD-10-CM | POA: Diagnosis not present

## 2020-02-27 DIAGNOSIS — Z992 Dependence on renal dialysis: Secondary | ICD-10-CM | POA: Diagnosis not present

## 2020-02-28 DIAGNOSIS — N186 End stage renal disease: Secondary | ICD-10-CM | POA: Diagnosis not present

## 2020-02-28 DIAGNOSIS — Z992 Dependence on renal dialysis: Secondary | ICD-10-CM | POA: Diagnosis not present

## 2020-02-29 DIAGNOSIS — N186 End stage renal disease: Secondary | ICD-10-CM | POA: Diagnosis not present

## 2020-02-29 DIAGNOSIS — S62512B Displaced fracture of proximal phalanx of left thumb, initial encounter for open fracture: Secondary | ICD-10-CM | POA: Insufficient documentation

## 2020-02-29 DIAGNOSIS — Z992 Dependence on renal dialysis: Secondary | ICD-10-CM | POA: Diagnosis not present

## 2020-03-01 DIAGNOSIS — N186 End stage renal disease: Secondary | ICD-10-CM | POA: Diagnosis not present

## 2020-03-01 DIAGNOSIS — Z992 Dependence on renal dialysis: Secondary | ICD-10-CM | POA: Diagnosis not present

## 2020-03-02 DIAGNOSIS — Z992 Dependence on renal dialysis: Secondary | ICD-10-CM | POA: Diagnosis not present

## 2020-03-02 DIAGNOSIS — N186 End stage renal disease: Secondary | ICD-10-CM | POA: Diagnosis not present

## 2020-03-04 DIAGNOSIS — N186 End stage renal disease: Secondary | ICD-10-CM | POA: Diagnosis not present

## 2020-03-04 DIAGNOSIS — Z992 Dependence on renal dialysis: Secondary | ICD-10-CM | POA: Diagnosis not present

## 2020-03-05 DIAGNOSIS — N186 End stage renal disease: Secondary | ICD-10-CM | POA: Diagnosis not present

## 2020-03-05 DIAGNOSIS — Z992 Dependence on renal dialysis: Secondary | ICD-10-CM | POA: Diagnosis not present

## 2020-03-06 DIAGNOSIS — N186 End stage renal disease: Secondary | ICD-10-CM | POA: Diagnosis not present

## 2020-03-06 DIAGNOSIS — Z992 Dependence on renal dialysis: Secondary | ICD-10-CM | POA: Diagnosis not present

## 2020-03-07 DIAGNOSIS — N186 End stage renal disease: Secondary | ICD-10-CM | POA: Diagnosis not present

## 2020-03-07 DIAGNOSIS — Z992 Dependence on renal dialysis: Secondary | ICD-10-CM | POA: Diagnosis not present

## 2020-03-08 DIAGNOSIS — Z992 Dependence on renal dialysis: Secondary | ICD-10-CM | POA: Diagnosis not present

## 2020-03-08 DIAGNOSIS — N186 End stage renal disease: Secondary | ICD-10-CM | POA: Diagnosis not present

## 2020-03-09 DIAGNOSIS — Z992 Dependence on renal dialysis: Secondary | ICD-10-CM | POA: Diagnosis not present

## 2020-03-09 DIAGNOSIS — N186 End stage renal disease: Secondary | ICD-10-CM | POA: Diagnosis not present

## 2020-03-11 DIAGNOSIS — Z992 Dependence on renal dialysis: Secondary | ICD-10-CM | POA: Diagnosis not present

## 2020-03-11 DIAGNOSIS — N186 End stage renal disease: Secondary | ICD-10-CM | POA: Diagnosis not present

## 2020-03-12 DIAGNOSIS — Z992 Dependence on renal dialysis: Secondary | ICD-10-CM | POA: Diagnosis not present

## 2020-03-12 DIAGNOSIS — N186 End stage renal disease: Secondary | ICD-10-CM | POA: Diagnosis not present

## 2020-03-13 DIAGNOSIS — Z992 Dependence on renal dialysis: Secondary | ICD-10-CM | POA: Diagnosis not present

## 2020-03-13 DIAGNOSIS — N186 End stage renal disease: Secondary | ICD-10-CM | POA: Diagnosis not present

## 2020-03-14 DIAGNOSIS — Z992 Dependence on renal dialysis: Secondary | ICD-10-CM | POA: Diagnosis not present

## 2020-03-14 DIAGNOSIS — N186 End stage renal disease: Secondary | ICD-10-CM | POA: Diagnosis not present

## 2020-03-15 DIAGNOSIS — N186 End stage renal disease: Secondary | ICD-10-CM | POA: Diagnosis not present

## 2020-03-15 DIAGNOSIS — Z992 Dependence on renal dialysis: Secondary | ICD-10-CM | POA: Diagnosis not present

## 2020-03-16 DIAGNOSIS — N186 End stage renal disease: Secondary | ICD-10-CM | POA: Diagnosis not present

## 2020-03-16 DIAGNOSIS — Z992 Dependence on renal dialysis: Secondary | ICD-10-CM | POA: Diagnosis not present

## 2020-03-18 DIAGNOSIS — N186 End stage renal disease: Secondary | ICD-10-CM | POA: Diagnosis not present

## 2020-03-18 DIAGNOSIS — Z992 Dependence on renal dialysis: Secondary | ICD-10-CM | POA: Diagnosis not present

## 2020-03-19 DIAGNOSIS — N186 End stage renal disease: Secondary | ICD-10-CM | POA: Diagnosis not present

## 2020-03-19 DIAGNOSIS — Z992 Dependence on renal dialysis: Secondary | ICD-10-CM | POA: Diagnosis not present

## 2020-03-20 DIAGNOSIS — Z992 Dependence on renal dialysis: Secondary | ICD-10-CM | POA: Diagnosis not present

## 2020-03-20 DIAGNOSIS — N186 End stage renal disease: Secondary | ICD-10-CM | POA: Diagnosis not present

## 2020-03-21 DIAGNOSIS — Z992 Dependence on renal dialysis: Secondary | ICD-10-CM | POA: Diagnosis not present

## 2020-03-21 DIAGNOSIS — N186 End stage renal disease: Secondary | ICD-10-CM | POA: Diagnosis not present

## 2020-03-22 DIAGNOSIS — Z992 Dependence on renal dialysis: Secondary | ICD-10-CM | POA: Diagnosis not present

## 2020-03-22 DIAGNOSIS — N186 End stage renal disease: Secondary | ICD-10-CM | POA: Diagnosis not present

## 2020-03-23 DIAGNOSIS — Z992 Dependence on renal dialysis: Secondary | ICD-10-CM | POA: Diagnosis not present

## 2020-03-23 DIAGNOSIS — N186 End stage renal disease: Secondary | ICD-10-CM | POA: Diagnosis not present

## 2020-03-25 DIAGNOSIS — N186 End stage renal disease: Secondary | ICD-10-CM | POA: Diagnosis not present

## 2020-03-25 DIAGNOSIS — Z992 Dependence on renal dialysis: Secondary | ICD-10-CM | POA: Diagnosis not present

## 2020-03-26 DIAGNOSIS — N186 End stage renal disease: Secondary | ICD-10-CM | POA: Diagnosis not present

## 2020-03-26 DIAGNOSIS — Z992 Dependence on renal dialysis: Secondary | ICD-10-CM | POA: Diagnosis not present

## 2020-03-27 DIAGNOSIS — N186 End stage renal disease: Secondary | ICD-10-CM | POA: Diagnosis not present

## 2020-03-27 DIAGNOSIS — Z992 Dependence on renal dialysis: Secondary | ICD-10-CM | POA: Diagnosis not present

## 2020-03-28 DIAGNOSIS — Z992 Dependence on renal dialysis: Secondary | ICD-10-CM | POA: Diagnosis not present

## 2020-03-28 DIAGNOSIS — I259 Chronic ischemic heart disease, unspecified: Secondary | ICD-10-CM | POA: Diagnosis not present

## 2020-03-28 DIAGNOSIS — E878 Other disorders of electrolyte and fluid balance, not elsewhere classified: Secondary | ICD-10-CM | POA: Diagnosis not present

## 2020-03-28 DIAGNOSIS — D509 Iron deficiency anemia, unspecified: Secondary | ICD-10-CM | POA: Diagnosis not present

## 2020-03-28 DIAGNOSIS — N186 End stage renal disease: Secondary | ICD-10-CM | POA: Diagnosis not present

## 2020-03-29 DIAGNOSIS — Z992 Dependence on renal dialysis: Secondary | ICD-10-CM | POA: Diagnosis not present

## 2020-03-29 DIAGNOSIS — N186 End stage renal disease: Secondary | ICD-10-CM | POA: Diagnosis not present

## 2020-03-30 DIAGNOSIS — N186 End stage renal disease: Secondary | ICD-10-CM | POA: Diagnosis not present

## 2020-03-30 DIAGNOSIS — Z992 Dependence on renal dialysis: Secondary | ICD-10-CM | POA: Diagnosis not present

## 2020-04-01 DIAGNOSIS — N186 End stage renal disease: Secondary | ICD-10-CM | POA: Diagnosis not present

## 2020-04-01 DIAGNOSIS — Z992 Dependence on renal dialysis: Secondary | ICD-10-CM | POA: Diagnosis not present

## 2020-04-02 DIAGNOSIS — N186 End stage renal disease: Secondary | ICD-10-CM | POA: Diagnosis not present

## 2020-04-02 DIAGNOSIS — Z992 Dependence on renal dialysis: Secondary | ICD-10-CM | POA: Diagnosis not present

## 2020-04-03 DIAGNOSIS — N186 End stage renal disease: Secondary | ICD-10-CM | POA: Diagnosis not present

## 2020-04-03 DIAGNOSIS — Z992 Dependence on renal dialysis: Secondary | ICD-10-CM | POA: Diagnosis not present

## 2020-04-04 DIAGNOSIS — Z992 Dependence on renal dialysis: Secondary | ICD-10-CM | POA: Diagnosis not present

## 2020-04-04 DIAGNOSIS — N186 End stage renal disease: Secondary | ICD-10-CM | POA: Diagnosis not present

## 2020-04-05 DIAGNOSIS — Z992 Dependence on renal dialysis: Secondary | ICD-10-CM | POA: Diagnosis not present

## 2020-04-05 DIAGNOSIS — N186 End stage renal disease: Secondary | ICD-10-CM | POA: Diagnosis not present

## 2020-04-06 DIAGNOSIS — Z992 Dependence on renal dialysis: Secondary | ICD-10-CM | POA: Diagnosis not present

## 2020-04-06 DIAGNOSIS — N186 End stage renal disease: Secondary | ICD-10-CM | POA: Diagnosis not present

## 2020-04-08 DIAGNOSIS — N186 End stage renal disease: Secondary | ICD-10-CM | POA: Diagnosis not present

## 2020-04-08 DIAGNOSIS — Z992 Dependence on renal dialysis: Secondary | ICD-10-CM | POA: Diagnosis not present

## 2020-04-09 DIAGNOSIS — N186 End stage renal disease: Secondary | ICD-10-CM | POA: Diagnosis not present

## 2020-04-09 DIAGNOSIS — Z992 Dependence on renal dialysis: Secondary | ICD-10-CM | POA: Diagnosis not present

## 2020-04-10 DIAGNOSIS — N186 End stage renal disease: Secondary | ICD-10-CM | POA: Diagnosis not present

## 2020-04-10 DIAGNOSIS — Z992 Dependence on renal dialysis: Secondary | ICD-10-CM | POA: Diagnosis not present

## 2020-04-11 DIAGNOSIS — N186 End stage renal disease: Secondary | ICD-10-CM | POA: Diagnosis not present

## 2020-04-11 DIAGNOSIS — Z992 Dependence on renal dialysis: Secondary | ICD-10-CM | POA: Diagnosis not present

## 2020-04-12 DIAGNOSIS — Z992 Dependence on renal dialysis: Secondary | ICD-10-CM | POA: Diagnosis not present

## 2020-04-12 DIAGNOSIS — N186 End stage renal disease: Secondary | ICD-10-CM | POA: Diagnosis not present

## 2020-04-13 DIAGNOSIS — Z992 Dependence on renal dialysis: Secondary | ICD-10-CM | POA: Diagnosis not present

## 2020-04-13 DIAGNOSIS — N186 End stage renal disease: Secondary | ICD-10-CM | POA: Diagnosis not present

## 2020-04-15 DIAGNOSIS — Z992 Dependence on renal dialysis: Secondary | ICD-10-CM | POA: Diagnosis not present

## 2020-04-15 DIAGNOSIS — N186 End stage renal disease: Secondary | ICD-10-CM | POA: Diagnosis not present

## 2020-04-16 DIAGNOSIS — N186 End stage renal disease: Secondary | ICD-10-CM | POA: Diagnosis not present

## 2020-04-16 DIAGNOSIS — Z992 Dependence on renal dialysis: Secondary | ICD-10-CM | POA: Diagnosis not present

## 2020-04-17 DIAGNOSIS — Z992 Dependence on renal dialysis: Secondary | ICD-10-CM | POA: Diagnosis not present

## 2020-04-17 DIAGNOSIS — N186 End stage renal disease: Secondary | ICD-10-CM | POA: Diagnosis not present

## 2020-04-18 DIAGNOSIS — Z992 Dependence on renal dialysis: Secondary | ICD-10-CM | POA: Diagnosis not present

## 2020-04-18 DIAGNOSIS — N186 End stage renal disease: Secondary | ICD-10-CM | POA: Diagnosis not present

## 2020-04-19 DIAGNOSIS — N186 End stage renal disease: Secondary | ICD-10-CM | POA: Diagnosis not present

## 2020-04-19 DIAGNOSIS — Z992 Dependence on renal dialysis: Secondary | ICD-10-CM | POA: Diagnosis not present

## 2020-04-20 DIAGNOSIS — Z992 Dependence on renal dialysis: Secondary | ICD-10-CM | POA: Diagnosis not present

## 2020-04-20 DIAGNOSIS — N186 End stage renal disease: Secondary | ICD-10-CM | POA: Diagnosis not present

## 2020-04-21 DIAGNOSIS — N186 End stage renal disease: Secondary | ICD-10-CM | POA: Diagnosis not present

## 2020-04-21 DIAGNOSIS — Z992 Dependence on renal dialysis: Secondary | ICD-10-CM | POA: Diagnosis not present

## 2020-04-22 DIAGNOSIS — N186 End stage renal disease: Secondary | ICD-10-CM | POA: Diagnosis not present

## 2020-04-22 DIAGNOSIS — Z992 Dependence on renal dialysis: Secondary | ICD-10-CM | POA: Diagnosis not present

## 2020-04-23 DIAGNOSIS — N186 End stage renal disease: Secondary | ICD-10-CM | POA: Diagnosis not present

## 2020-04-23 DIAGNOSIS — Z992 Dependence on renal dialysis: Secondary | ICD-10-CM | POA: Diagnosis not present

## 2020-04-24 DIAGNOSIS — Z992 Dependence on renal dialysis: Secondary | ICD-10-CM | POA: Diagnosis not present

## 2020-04-24 DIAGNOSIS — N186 End stage renal disease: Secondary | ICD-10-CM | POA: Diagnosis not present

## 2020-04-25 DIAGNOSIS — Z992 Dependence on renal dialysis: Secondary | ICD-10-CM | POA: Diagnosis not present

## 2020-04-25 DIAGNOSIS — N186 End stage renal disease: Secondary | ICD-10-CM | POA: Diagnosis not present

## 2020-04-26 DIAGNOSIS — Z992 Dependence on renal dialysis: Secondary | ICD-10-CM | POA: Diagnosis not present

## 2020-04-26 DIAGNOSIS — D509 Iron deficiency anemia, unspecified: Secondary | ICD-10-CM | POA: Diagnosis not present

## 2020-04-26 DIAGNOSIS — E878 Other disorders of electrolyte and fluid balance, not elsewhere classified: Secondary | ICD-10-CM | POA: Diagnosis not present

## 2020-04-26 DIAGNOSIS — N186 End stage renal disease: Secondary | ICD-10-CM | POA: Diagnosis not present

## 2020-04-27 DIAGNOSIS — N186 End stage renal disease: Secondary | ICD-10-CM | POA: Diagnosis not present

## 2020-04-27 DIAGNOSIS — Z992 Dependence on renal dialysis: Secondary | ICD-10-CM | POA: Diagnosis not present

## 2020-04-29 DIAGNOSIS — Z992 Dependence on renal dialysis: Secondary | ICD-10-CM | POA: Diagnosis not present

## 2020-04-29 DIAGNOSIS — N186 End stage renal disease: Secondary | ICD-10-CM | POA: Diagnosis not present

## 2020-04-30 DIAGNOSIS — N186 End stage renal disease: Secondary | ICD-10-CM | POA: Diagnosis not present

## 2020-04-30 DIAGNOSIS — Z992 Dependence on renal dialysis: Secondary | ICD-10-CM | POA: Diagnosis not present

## 2020-05-01 DIAGNOSIS — Z992 Dependence on renal dialysis: Secondary | ICD-10-CM | POA: Diagnosis not present

## 2020-05-01 DIAGNOSIS — N186 End stage renal disease: Secondary | ICD-10-CM | POA: Diagnosis not present

## 2020-05-02 DIAGNOSIS — N186 End stage renal disease: Secondary | ICD-10-CM | POA: Diagnosis not present

## 2020-05-02 DIAGNOSIS — Z992 Dependence on renal dialysis: Secondary | ICD-10-CM | POA: Diagnosis not present

## 2020-05-03 DIAGNOSIS — Z992 Dependence on renal dialysis: Secondary | ICD-10-CM | POA: Diagnosis not present

## 2020-05-03 DIAGNOSIS — N186 End stage renal disease: Secondary | ICD-10-CM | POA: Diagnosis not present

## 2020-05-04 DIAGNOSIS — N186 End stage renal disease: Secondary | ICD-10-CM | POA: Diagnosis not present

## 2020-05-04 DIAGNOSIS — Z992 Dependence on renal dialysis: Secondary | ICD-10-CM | POA: Diagnosis not present

## 2020-05-06 DIAGNOSIS — Z992 Dependence on renal dialysis: Secondary | ICD-10-CM | POA: Diagnosis not present

## 2020-05-06 DIAGNOSIS — N186 End stage renal disease: Secondary | ICD-10-CM | POA: Diagnosis not present

## 2020-05-07 DIAGNOSIS — N186 End stage renal disease: Secondary | ICD-10-CM | POA: Diagnosis not present

## 2020-05-07 DIAGNOSIS — Z992 Dependence on renal dialysis: Secondary | ICD-10-CM | POA: Diagnosis not present

## 2020-05-08 DIAGNOSIS — N186 End stage renal disease: Secondary | ICD-10-CM | POA: Diagnosis not present

## 2020-05-08 DIAGNOSIS — Z992 Dependence on renal dialysis: Secondary | ICD-10-CM | POA: Diagnosis not present

## 2020-05-09 DIAGNOSIS — N186 End stage renal disease: Secondary | ICD-10-CM | POA: Diagnosis not present

## 2020-05-09 DIAGNOSIS — Z992 Dependence on renal dialysis: Secondary | ICD-10-CM | POA: Diagnosis not present

## 2020-05-10 DIAGNOSIS — N186 End stage renal disease: Secondary | ICD-10-CM | POA: Diagnosis not present

## 2020-05-10 DIAGNOSIS — Z992 Dependence on renal dialysis: Secondary | ICD-10-CM | POA: Diagnosis not present

## 2020-05-11 DIAGNOSIS — N186 End stage renal disease: Secondary | ICD-10-CM | POA: Diagnosis not present

## 2020-05-11 DIAGNOSIS — Z992 Dependence on renal dialysis: Secondary | ICD-10-CM | POA: Diagnosis not present

## 2020-05-13 DIAGNOSIS — Z992 Dependence on renal dialysis: Secondary | ICD-10-CM | POA: Diagnosis not present

## 2020-05-13 DIAGNOSIS — N186 End stage renal disease: Secondary | ICD-10-CM | POA: Diagnosis not present

## 2020-05-14 DIAGNOSIS — Z992 Dependence on renal dialysis: Secondary | ICD-10-CM | POA: Diagnosis not present

## 2020-05-14 DIAGNOSIS — N186 End stage renal disease: Secondary | ICD-10-CM | POA: Diagnosis not present

## 2020-05-15 DIAGNOSIS — N186 End stage renal disease: Secondary | ICD-10-CM | POA: Diagnosis not present

## 2020-05-15 DIAGNOSIS — Z992 Dependence on renal dialysis: Secondary | ICD-10-CM | POA: Diagnosis not present

## 2020-05-16 DIAGNOSIS — N186 End stage renal disease: Secondary | ICD-10-CM | POA: Diagnosis not present

## 2020-05-16 DIAGNOSIS — Z992 Dependence on renal dialysis: Secondary | ICD-10-CM | POA: Diagnosis not present

## 2020-05-17 DIAGNOSIS — N186 End stage renal disease: Secondary | ICD-10-CM | POA: Diagnosis not present

## 2020-05-17 DIAGNOSIS — Z992 Dependence on renal dialysis: Secondary | ICD-10-CM | POA: Diagnosis not present

## 2020-05-18 DIAGNOSIS — N186 End stage renal disease: Secondary | ICD-10-CM | POA: Diagnosis not present

## 2020-05-18 DIAGNOSIS — Z992 Dependence on renal dialysis: Secondary | ICD-10-CM | POA: Diagnosis not present

## 2020-05-20 DIAGNOSIS — N186 End stage renal disease: Secondary | ICD-10-CM | POA: Diagnosis not present

## 2020-05-20 DIAGNOSIS — Z992 Dependence on renal dialysis: Secondary | ICD-10-CM | POA: Diagnosis not present

## 2020-05-21 DIAGNOSIS — N186 End stage renal disease: Secondary | ICD-10-CM | POA: Diagnosis not present

## 2020-05-21 DIAGNOSIS — Z992 Dependence on renal dialysis: Secondary | ICD-10-CM | POA: Diagnosis not present

## 2020-05-22 DIAGNOSIS — N186 End stage renal disease: Secondary | ICD-10-CM | POA: Diagnosis not present

## 2020-05-22 DIAGNOSIS — Z992 Dependence on renal dialysis: Secondary | ICD-10-CM | POA: Diagnosis not present

## 2020-05-23 DIAGNOSIS — Z992 Dependence on renal dialysis: Secondary | ICD-10-CM | POA: Diagnosis not present

## 2020-05-23 DIAGNOSIS — N186 End stage renal disease: Secondary | ICD-10-CM | POA: Diagnosis not present

## 2020-05-24 DIAGNOSIS — N186 End stage renal disease: Secondary | ICD-10-CM | POA: Diagnosis not present

## 2020-05-24 DIAGNOSIS — Z992 Dependence on renal dialysis: Secondary | ICD-10-CM | POA: Diagnosis not present

## 2020-05-25 DIAGNOSIS — N186 End stage renal disease: Secondary | ICD-10-CM | POA: Diagnosis not present

## 2020-05-25 DIAGNOSIS — Z992 Dependence on renal dialysis: Secondary | ICD-10-CM | POA: Diagnosis not present

## 2020-05-27 DIAGNOSIS — N186 End stage renal disease: Secondary | ICD-10-CM | POA: Diagnosis not present

## 2020-05-27 DIAGNOSIS — Z992 Dependence on renal dialysis: Secondary | ICD-10-CM | POA: Diagnosis not present

## 2020-05-28 DIAGNOSIS — N186 End stage renal disease: Secondary | ICD-10-CM | POA: Diagnosis not present

## 2020-05-28 DIAGNOSIS — Z992 Dependence on renal dialysis: Secondary | ICD-10-CM | POA: Diagnosis not present

## 2020-05-29 DIAGNOSIS — N186 End stage renal disease: Secondary | ICD-10-CM | POA: Diagnosis not present

## 2020-05-29 DIAGNOSIS — Z992 Dependence on renal dialysis: Secondary | ICD-10-CM | POA: Diagnosis not present

## 2020-05-30 DIAGNOSIS — Z992 Dependence on renal dialysis: Secondary | ICD-10-CM | POA: Diagnosis not present

## 2020-05-30 DIAGNOSIS — N186 End stage renal disease: Secondary | ICD-10-CM | POA: Diagnosis not present

## 2020-05-31 DIAGNOSIS — Z992 Dependence on renal dialysis: Secondary | ICD-10-CM | POA: Diagnosis not present

## 2020-05-31 DIAGNOSIS — N186 End stage renal disease: Secondary | ICD-10-CM | POA: Diagnosis not present

## 2020-06-01 DIAGNOSIS — Z992 Dependence on renal dialysis: Secondary | ICD-10-CM | POA: Diagnosis not present

## 2020-06-01 DIAGNOSIS — N186 End stage renal disease: Secondary | ICD-10-CM | POA: Diagnosis not present

## 2020-06-03 DIAGNOSIS — N186 End stage renal disease: Secondary | ICD-10-CM | POA: Diagnosis not present

## 2020-06-03 DIAGNOSIS — Z992 Dependence on renal dialysis: Secondary | ICD-10-CM | POA: Diagnosis not present

## 2020-06-04 DIAGNOSIS — N186 End stage renal disease: Secondary | ICD-10-CM | POA: Diagnosis not present

## 2020-06-04 DIAGNOSIS — Z992 Dependence on renal dialysis: Secondary | ICD-10-CM | POA: Diagnosis not present

## 2020-06-05 DIAGNOSIS — E878 Other disorders of electrolyte and fluid balance, not elsewhere classified: Secondary | ICD-10-CM | POA: Diagnosis not present

## 2020-06-05 DIAGNOSIS — D509 Iron deficiency anemia, unspecified: Secondary | ICD-10-CM | POA: Diagnosis not present

## 2020-06-05 DIAGNOSIS — N186 End stage renal disease: Secondary | ICD-10-CM | POA: Diagnosis not present

## 2020-06-05 DIAGNOSIS — Z992 Dependence on renal dialysis: Secondary | ICD-10-CM | POA: Diagnosis not present

## 2020-06-06 DIAGNOSIS — N186 End stage renal disease: Secondary | ICD-10-CM | POA: Diagnosis not present

## 2020-06-06 DIAGNOSIS — Z992 Dependence on renal dialysis: Secondary | ICD-10-CM | POA: Diagnosis not present

## 2020-06-07 DIAGNOSIS — Z992 Dependence on renal dialysis: Secondary | ICD-10-CM | POA: Diagnosis not present

## 2020-06-07 DIAGNOSIS — N186 End stage renal disease: Secondary | ICD-10-CM | POA: Diagnosis not present

## 2020-06-08 DIAGNOSIS — Z992 Dependence on renal dialysis: Secondary | ICD-10-CM | POA: Diagnosis not present

## 2020-06-08 DIAGNOSIS — N186 End stage renal disease: Secondary | ICD-10-CM | POA: Diagnosis not present

## 2020-06-10 DIAGNOSIS — Z992 Dependence on renal dialysis: Secondary | ICD-10-CM | POA: Diagnosis not present

## 2020-06-10 DIAGNOSIS — N186 End stage renal disease: Secondary | ICD-10-CM | POA: Diagnosis not present

## 2020-06-11 DIAGNOSIS — N186 End stage renal disease: Secondary | ICD-10-CM | POA: Diagnosis not present

## 2020-06-11 DIAGNOSIS — Z992 Dependence on renal dialysis: Secondary | ICD-10-CM | POA: Diagnosis not present

## 2020-06-12 DIAGNOSIS — N186 End stage renal disease: Secondary | ICD-10-CM | POA: Diagnosis not present

## 2020-06-12 DIAGNOSIS — Z992 Dependence on renal dialysis: Secondary | ICD-10-CM | POA: Diagnosis not present

## 2020-06-13 DIAGNOSIS — N186 End stage renal disease: Secondary | ICD-10-CM | POA: Diagnosis not present

## 2020-06-13 DIAGNOSIS — Z992 Dependence on renal dialysis: Secondary | ICD-10-CM | POA: Diagnosis not present

## 2020-06-14 DIAGNOSIS — Z992 Dependence on renal dialysis: Secondary | ICD-10-CM | POA: Diagnosis not present

## 2020-06-14 DIAGNOSIS — N186 End stage renal disease: Secondary | ICD-10-CM | POA: Diagnosis not present

## 2020-06-15 DIAGNOSIS — N186 End stage renal disease: Secondary | ICD-10-CM | POA: Diagnosis not present

## 2020-06-15 DIAGNOSIS — Z992 Dependence on renal dialysis: Secondary | ICD-10-CM | POA: Diagnosis not present

## 2020-06-17 DIAGNOSIS — N186 End stage renal disease: Secondary | ICD-10-CM | POA: Diagnosis not present

## 2020-06-17 DIAGNOSIS — Z992 Dependence on renal dialysis: Secondary | ICD-10-CM | POA: Diagnosis not present

## 2020-06-18 DIAGNOSIS — N186 End stage renal disease: Secondary | ICD-10-CM | POA: Diagnosis not present

## 2020-06-18 DIAGNOSIS — Z992 Dependence on renal dialysis: Secondary | ICD-10-CM | POA: Diagnosis not present

## 2020-06-19 DIAGNOSIS — Z992 Dependence on renal dialysis: Secondary | ICD-10-CM | POA: Diagnosis not present

## 2020-06-19 DIAGNOSIS — N186 End stage renal disease: Secondary | ICD-10-CM | POA: Diagnosis not present

## 2020-06-20 DIAGNOSIS — Z992 Dependence on renal dialysis: Secondary | ICD-10-CM | POA: Diagnosis not present

## 2020-06-20 DIAGNOSIS — N186 End stage renal disease: Secondary | ICD-10-CM | POA: Diagnosis not present

## 2020-06-21 DIAGNOSIS — Z992 Dependence on renal dialysis: Secondary | ICD-10-CM | POA: Diagnosis not present

## 2020-06-21 DIAGNOSIS — N186 End stage renal disease: Secondary | ICD-10-CM | POA: Diagnosis not present

## 2020-06-22 DIAGNOSIS — Z992 Dependence on renal dialysis: Secondary | ICD-10-CM | POA: Diagnosis not present

## 2020-06-22 DIAGNOSIS — N186 End stage renal disease: Secondary | ICD-10-CM | POA: Diagnosis not present

## 2020-06-24 DIAGNOSIS — N186 End stage renal disease: Secondary | ICD-10-CM | POA: Diagnosis not present

## 2020-06-24 DIAGNOSIS — Z992 Dependence on renal dialysis: Secondary | ICD-10-CM | POA: Diagnosis not present

## 2020-06-25 DIAGNOSIS — N186 End stage renal disease: Secondary | ICD-10-CM | POA: Diagnosis not present

## 2020-06-25 DIAGNOSIS — Z992 Dependence on renal dialysis: Secondary | ICD-10-CM | POA: Diagnosis not present

## 2020-06-26 DIAGNOSIS — Z992 Dependence on renal dialysis: Secondary | ICD-10-CM | POA: Diagnosis not present

## 2020-06-26 DIAGNOSIS — N186 End stage renal disease: Secondary | ICD-10-CM | POA: Diagnosis not present

## 2020-06-27 DIAGNOSIS — N186 End stage renal disease: Secondary | ICD-10-CM | POA: Diagnosis not present

## 2020-06-27 DIAGNOSIS — Z992 Dependence on renal dialysis: Secondary | ICD-10-CM | POA: Diagnosis not present

## 2020-06-28 DIAGNOSIS — Z992 Dependence on renal dialysis: Secondary | ICD-10-CM | POA: Diagnosis not present

## 2020-06-28 DIAGNOSIS — N186 End stage renal disease: Secondary | ICD-10-CM | POA: Diagnosis not present

## 2020-06-29 DIAGNOSIS — N186 End stage renal disease: Secondary | ICD-10-CM | POA: Diagnosis not present

## 2020-06-29 DIAGNOSIS — Z992 Dependence on renal dialysis: Secondary | ICD-10-CM | POA: Diagnosis not present

## 2020-07-01 DIAGNOSIS — N186 End stage renal disease: Secondary | ICD-10-CM | POA: Diagnosis not present

## 2020-07-01 DIAGNOSIS — Z992 Dependence on renal dialysis: Secondary | ICD-10-CM | POA: Diagnosis not present

## 2020-07-02 DIAGNOSIS — Z992 Dependence on renal dialysis: Secondary | ICD-10-CM | POA: Diagnosis not present

## 2020-07-02 DIAGNOSIS — E878 Other disorders of electrolyte and fluid balance, not elsewhere classified: Secondary | ICD-10-CM | POA: Diagnosis not present

## 2020-07-02 DIAGNOSIS — D509 Iron deficiency anemia, unspecified: Secondary | ICD-10-CM | POA: Diagnosis not present

## 2020-07-02 DIAGNOSIS — N186 End stage renal disease: Secondary | ICD-10-CM | POA: Diagnosis not present

## 2020-07-03 DIAGNOSIS — Z992 Dependence on renal dialysis: Secondary | ICD-10-CM | POA: Diagnosis not present

## 2020-07-03 DIAGNOSIS — N186 End stage renal disease: Secondary | ICD-10-CM | POA: Diagnosis not present

## 2020-07-04 DIAGNOSIS — N186 End stage renal disease: Secondary | ICD-10-CM | POA: Diagnosis not present

## 2020-07-04 DIAGNOSIS — Z992 Dependence on renal dialysis: Secondary | ICD-10-CM | POA: Diagnosis not present

## 2020-07-05 DIAGNOSIS — Z992 Dependence on renal dialysis: Secondary | ICD-10-CM | POA: Diagnosis not present

## 2020-07-05 DIAGNOSIS — N186 End stage renal disease: Secondary | ICD-10-CM | POA: Diagnosis not present

## 2020-07-06 DIAGNOSIS — Z992 Dependence on renal dialysis: Secondary | ICD-10-CM | POA: Diagnosis not present

## 2020-07-06 DIAGNOSIS — N186 End stage renal disease: Secondary | ICD-10-CM | POA: Diagnosis not present

## 2020-07-08 DIAGNOSIS — Z992 Dependence on renal dialysis: Secondary | ICD-10-CM | POA: Diagnosis not present

## 2020-07-08 DIAGNOSIS — N186 End stage renal disease: Secondary | ICD-10-CM | POA: Diagnosis not present

## 2020-07-09 DIAGNOSIS — Z992 Dependence on renal dialysis: Secondary | ICD-10-CM | POA: Diagnosis not present

## 2020-07-09 DIAGNOSIS — N186 End stage renal disease: Secondary | ICD-10-CM | POA: Diagnosis not present

## 2020-07-10 DIAGNOSIS — N186 End stage renal disease: Secondary | ICD-10-CM | POA: Diagnosis not present

## 2020-07-10 DIAGNOSIS — Z992 Dependence on renal dialysis: Secondary | ICD-10-CM | POA: Diagnosis not present

## 2020-07-11 DIAGNOSIS — Z992 Dependence on renal dialysis: Secondary | ICD-10-CM | POA: Diagnosis not present

## 2020-07-11 DIAGNOSIS — N186 End stage renal disease: Secondary | ICD-10-CM | POA: Diagnosis not present

## 2020-07-12 DIAGNOSIS — Z992 Dependence on renal dialysis: Secondary | ICD-10-CM | POA: Diagnosis not present

## 2020-07-12 DIAGNOSIS — N186 End stage renal disease: Secondary | ICD-10-CM | POA: Diagnosis not present

## 2020-07-13 DIAGNOSIS — N186 End stage renal disease: Secondary | ICD-10-CM | POA: Diagnosis not present

## 2020-07-13 DIAGNOSIS — Z992 Dependence on renal dialysis: Secondary | ICD-10-CM | POA: Diagnosis not present

## 2020-07-15 DIAGNOSIS — N186 End stage renal disease: Secondary | ICD-10-CM | POA: Diagnosis not present

## 2020-07-15 DIAGNOSIS — Z992 Dependence on renal dialysis: Secondary | ICD-10-CM | POA: Diagnosis not present

## 2020-07-16 DIAGNOSIS — N186 End stage renal disease: Secondary | ICD-10-CM | POA: Diagnosis not present

## 2020-07-16 DIAGNOSIS — Z992 Dependence on renal dialysis: Secondary | ICD-10-CM | POA: Diagnosis not present

## 2020-07-17 DIAGNOSIS — Z992 Dependence on renal dialysis: Secondary | ICD-10-CM | POA: Diagnosis not present

## 2020-07-17 DIAGNOSIS — N186 End stage renal disease: Secondary | ICD-10-CM | POA: Diagnosis not present

## 2020-07-18 DIAGNOSIS — Z992 Dependence on renal dialysis: Secondary | ICD-10-CM | POA: Diagnosis not present

## 2020-07-18 DIAGNOSIS — N186 End stage renal disease: Secondary | ICD-10-CM | POA: Diagnosis not present

## 2020-07-19 DIAGNOSIS — N186 End stage renal disease: Secondary | ICD-10-CM | POA: Diagnosis not present

## 2020-07-19 DIAGNOSIS — Z992 Dependence on renal dialysis: Secondary | ICD-10-CM | POA: Diagnosis not present

## 2020-07-20 DIAGNOSIS — N186 End stage renal disease: Secondary | ICD-10-CM | POA: Diagnosis not present

## 2020-07-20 DIAGNOSIS — Z992 Dependence on renal dialysis: Secondary | ICD-10-CM | POA: Diagnosis not present

## 2020-07-22 DIAGNOSIS — N186 End stage renal disease: Secondary | ICD-10-CM | POA: Diagnosis not present

## 2020-07-22 DIAGNOSIS — Z992 Dependence on renal dialysis: Secondary | ICD-10-CM | POA: Diagnosis not present

## 2020-07-23 DIAGNOSIS — Z23 Encounter for immunization: Secondary | ICD-10-CM | POA: Diagnosis not present

## 2020-07-23 DIAGNOSIS — N186 End stage renal disease: Secondary | ICD-10-CM | POA: Diagnosis not present

## 2020-07-23 DIAGNOSIS — Z992 Dependence on renal dialysis: Secondary | ICD-10-CM | POA: Diagnosis not present

## 2020-07-24 DIAGNOSIS — Z23 Encounter for immunization: Secondary | ICD-10-CM | POA: Diagnosis not present

## 2020-07-24 DIAGNOSIS — Z992 Dependence on renal dialysis: Secondary | ICD-10-CM | POA: Diagnosis not present

## 2020-07-24 DIAGNOSIS — N186 End stage renal disease: Secondary | ICD-10-CM | POA: Diagnosis not present

## 2020-07-25 DIAGNOSIS — N186 End stage renal disease: Secondary | ICD-10-CM | POA: Diagnosis not present

## 2020-07-25 DIAGNOSIS — Z992 Dependence on renal dialysis: Secondary | ICD-10-CM | POA: Diagnosis not present

## 2020-07-25 DIAGNOSIS — Z23 Encounter for immunization: Secondary | ICD-10-CM | POA: Diagnosis not present

## 2020-07-26 DIAGNOSIS — N186 End stage renal disease: Secondary | ICD-10-CM | POA: Diagnosis not present

## 2020-07-26 DIAGNOSIS — Z23 Encounter for immunization: Secondary | ICD-10-CM | POA: Diagnosis not present

## 2020-07-26 DIAGNOSIS — Z992 Dependence on renal dialysis: Secondary | ICD-10-CM | POA: Diagnosis not present

## 2020-07-27 DIAGNOSIS — N186 End stage renal disease: Secondary | ICD-10-CM | POA: Diagnosis not present

## 2020-07-27 DIAGNOSIS — Z992 Dependence on renal dialysis: Secondary | ICD-10-CM | POA: Diagnosis not present

## 2020-07-27 DIAGNOSIS — Z23 Encounter for immunization: Secondary | ICD-10-CM | POA: Diagnosis not present

## 2020-07-29 DIAGNOSIS — Z992 Dependence on renal dialysis: Secondary | ICD-10-CM | POA: Diagnosis not present

## 2020-07-29 DIAGNOSIS — Z23 Encounter for immunization: Secondary | ICD-10-CM | POA: Diagnosis not present

## 2020-07-29 DIAGNOSIS — N186 End stage renal disease: Secondary | ICD-10-CM | POA: Diagnosis not present

## 2020-07-30 DIAGNOSIS — N186 End stage renal disease: Secondary | ICD-10-CM | POA: Diagnosis not present

## 2020-07-30 DIAGNOSIS — Z992 Dependence on renal dialysis: Secondary | ICD-10-CM | POA: Diagnosis not present

## 2020-07-31 DIAGNOSIS — N186 End stage renal disease: Secondary | ICD-10-CM | POA: Diagnosis not present

## 2020-07-31 DIAGNOSIS — Z992 Dependence on renal dialysis: Secondary | ICD-10-CM | POA: Diagnosis not present

## 2020-08-01 DIAGNOSIS — N186 End stage renal disease: Secondary | ICD-10-CM | POA: Diagnosis not present

## 2020-08-01 DIAGNOSIS — Z992 Dependence on renal dialysis: Secondary | ICD-10-CM | POA: Diagnosis not present

## 2020-08-02 DIAGNOSIS — N186 End stage renal disease: Secondary | ICD-10-CM | POA: Diagnosis not present

## 2020-08-02 DIAGNOSIS — Z992 Dependence on renal dialysis: Secondary | ICD-10-CM | POA: Diagnosis not present

## 2020-08-03 DIAGNOSIS — N186 End stage renal disease: Secondary | ICD-10-CM | POA: Diagnosis not present

## 2020-08-03 DIAGNOSIS — Z992 Dependence on renal dialysis: Secondary | ICD-10-CM | POA: Diagnosis not present

## 2020-08-05 DIAGNOSIS — Z992 Dependence on renal dialysis: Secondary | ICD-10-CM | POA: Diagnosis not present

## 2020-08-05 DIAGNOSIS — N186 End stage renal disease: Secondary | ICD-10-CM | POA: Diagnosis not present

## 2020-08-06 DIAGNOSIS — N186 End stage renal disease: Secondary | ICD-10-CM | POA: Diagnosis not present

## 2020-08-06 DIAGNOSIS — Z992 Dependence on renal dialysis: Secondary | ICD-10-CM | POA: Diagnosis not present

## 2020-08-07 DIAGNOSIS — Z992 Dependence on renal dialysis: Secondary | ICD-10-CM | POA: Diagnosis not present

## 2020-08-07 DIAGNOSIS — N186 End stage renal disease: Secondary | ICD-10-CM | POA: Diagnosis not present

## 2020-08-08 DIAGNOSIS — Z992 Dependence on renal dialysis: Secondary | ICD-10-CM | POA: Diagnosis not present

## 2020-08-08 DIAGNOSIS — N186 End stage renal disease: Secondary | ICD-10-CM | POA: Diagnosis not present

## 2020-08-09 DIAGNOSIS — Z992 Dependence on renal dialysis: Secondary | ICD-10-CM | POA: Diagnosis not present

## 2020-08-09 DIAGNOSIS — N186 End stage renal disease: Secondary | ICD-10-CM | POA: Diagnosis not present

## 2020-08-10 DIAGNOSIS — Z992 Dependence on renal dialysis: Secondary | ICD-10-CM | POA: Diagnosis not present

## 2020-08-10 DIAGNOSIS — N186 End stage renal disease: Secondary | ICD-10-CM | POA: Diagnosis not present

## 2020-08-12 DIAGNOSIS — N186 End stage renal disease: Secondary | ICD-10-CM | POA: Diagnosis not present

## 2020-08-12 DIAGNOSIS — Z992 Dependence on renal dialysis: Secondary | ICD-10-CM | POA: Diagnosis not present

## 2020-08-13 DIAGNOSIS — N186 End stage renal disease: Secondary | ICD-10-CM | POA: Diagnosis not present

## 2020-08-13 DIAGNOSIS — Z992 Dependence on renal dialysis: Secondary | ICD-10-CM | POA: Diagnosis not present

## 2020-08-14 DIAGNOSIS — Z992 Dependence on renal dialysis: Secondary | ICD-10-CM | POA: Diagnosis not present

## 2020-08-14 DIAGNOSIS — N186 End stage renal disease: Secondary | ICD-10-CM | POA: Diagnosis not present

## 2020-08-15 DIAGNOSIS — N186 End stage renal disease: Secondary | ICD-10-CM | POA: Diagnosis not present

## 2020-08-15 DIAGNOSIS — Z992 Dependence on renal dialysis: Secondary | ICD-10-CM | POA: Diagnosis not present

## 2020-08-16 DIAGNOSIS — Z992 Dependence on renal dialysis: Secondary | ICD-10-CM | POA: Diagnosis not present

## 2020-08-16 DIAGNOSIS — N186 End stage renal disease: Secondary | ICD-10-CM | POA: Diagnosis not present

## 2020-08-17 DIAGNOSIS — N186 End stage renal disease: Secondary | ICD-10-CM | POA: Diagnosis not present

## 2020-08-17 DIAGNOSIS — Z992 Dependence on renal dialysis: Secondary | ICD-10-CM | POA: Diagnosis not present

## 2020-08-19 DIAGNOSIS — Z992 Dependence on renal dialysis: Secondary | ICD-10-CM | POA: Diagnosis not present

## 2020-08-19 DIAGNOSIS — N186 End stage renal disease: Secondary | ICD-10-CM | POA: Diagnosis not present

## 2020-08-20 DIAGNOSIS — Z992 Dependence on renal dialysis: Secondary | ICD-10-CM | POA: Diagnosis not present

## 2020-08-20 DIAGNOSIS — N186 End stage renal disease: Secondary | ICD-10-CM | POA: Diagnosis not present

## 2020-08-21 DIAGNOSIS — N186 End stage renal disease: Secondary | ICD-10-CM | POA: Diagnosis not present

## 2020-08-21 DIAGNOSIS — Z992 Dependence on renal dialysis: Secondary | ICD-10-CM | POA: Diagnosis not present

## 2020-08-22 DIAGNOSIS — N186 End stage renal disease: Secondary | ICD-10-CM | POA: Diagnosis not present

## 2020-08-22 DIAGNOSIS — Z992 Dependence on renal dialysis: Secondary | ICD-10-CM | POA: Diagnosis not present

## 2020-08-23 DIAGNOSIS — Z992 Dependence on renal dialysis: Secondary | ICD-10-CM | POA: Diagnosis not present

## 2020-08-23 DIAGNOSIS — N186 End stage renal disease: Secondary | ICD-10-CM | POA: Diagnosis not present

## 2020-08-24 DIAGNOSIS — N186 End stage renal disease: Secondary | ICD-10-CM | POA: Diagnosis not present

## 2020-08-24 DIAGNOSIS — Z992 Dependence on renal dialysis: Secondary | ICD-10-CM | POA: Diagnosis not present

## 2020-08-26 DIAGNOSIS — Z992 Dependence on renal dialysis: Secondary | ICD-10-CM | POA: Diagnosis not present

## 2020-08-26 DIAGNOSIS — N186 End stage renal disease: Secondary | ICD-10-CM | POA: Diagnosis not present

## 2020-08-27 DIAGNOSIS — Z992 Dependence on renal dialysis: Secondary | ICD-10-CM | POA: Diagnosis not present

## 2020-08-27 DIAGNOSIS — N186 End stage renal disease: Secondary | ICD-10-CM | POA: Diagnosis not present

## 2020-08-28 DIAGNOSIS — Z992 Dependence on renal dialysis: Secondary | ICD-10-CM | POA: Diagnosis not present

## 2020-08-28 DIAGNOSIS — N186 End stage renal disease: Secondary | ICD-10-CM | POA: Diagnosis not present

## 2020-08-29 DIAGNOSIS — Z992 Dependence on renal dialysis: Secondary | ICD-10-CM | POA: Diagnosis not present

## 2020-08-29 DIAGNOSIS — N186 End stage renal disease: Secondary | ICD-10-CM | POA: Diagnosis not present

## 2020-08-30 DIAGNOSIS — Z992 Dependence on renal dialysis: Secondary | ICD-10-CM | POA: Diagnosis not present

## 2020-08-30 DIAGNOSIS — N186 End stage renal disease: Secondary | ICD-10-CM | POA: Diagnosis not present

## 2020-08-31 DIAGNOSIS — Z992 Dependence on renal dialysis: Secondary | ICD-10-CM | POA: Diagnosis not present

## 2020-08-31 DIAGNOSIS — N186 End stage renal disease: Secondary | ICD-10-CM | POA: Diagnosis not present

## 2020-09-02 DIAGNOSIS — N186 End stage renal disease: Secondary | ICD-10-CM | POA: Diagnosis not present

## 2020-09-02 DIAGNOSIS — Z992 Dependence on renal dialysis: Secondary | ICD-10-CM | POA: Diagnosis not present

## 2020-09-03 DIAGNOSIS — N186 End stage renal disease: Secondary | ICD-10-CM | POA: Diagnosis not present

## 2020-09-03 DIAGNOSIS — Z992 Dependence on renal dialysis: Secondary | ICD-10-CM | POA: Diagnosis not present

## 2020-09-04 DIAGNOSIS — Z992 Dependence on renal dialysis: Secondary | ICD-10-CM | POA: Diagnosis not present

## 2020-09-04 DIAGNOSIS — N186 End stage renal disease: Secondary | ICD-10-CM | POA: Diagnosis not present

## 2020-09-05 DIAGNOSIS — Z992 Dependence on renal dialysis: Secondary | ICD-10-CM | POA: Diagnosis not present

## 2020-09-05 DIAGNOSIS — N186 End stage renal disease: Secondary | ICD-10-CM | POA: Diagnosis not present

## 2020-09-06 DIAGNOSIS — Z992 Dependence on renal dialysis: Secondary | ICD-10-CM | POA: Diagnosis not present

## 2020-09-06 DIAGNOSIS — N186 End stage renal disease: Secondary | ICD-10-CM | POA: Diagnosis not present

## 2020-09-07 DIAGNOSIS — N186 End stage renal disease: Secondary | ICD-10-CM | POA: Diagnosis not present

## 2020-09-07 DIAGNOSIS — Z992 Dependence on renal dialysis: Secondary | ICD-10-CM | POA: Diagnosis not present

## 2020-09-09 DIAGNOSIS — Z992 Dependence on renal dialysis: Secondary | ICD-10-CM | POA: Diagnosis not present

## 2020-09-09 DIAGNOSIS — N186 End stage renal disease: Secondary | ICD-10-CM | POA: Diagnosis not present

## 2020-09-10 DIAGNOSIS — N186 End stage renal disease: Secondary | ICD-10-CM | POA: Diagnosis not present

## 2020-09-10 DIAGNOSIS — Z992 Dependence on renal dialysis: Secondary | ICD-10-CM | POA: Diagnosis not present

## 2020-09-11 DIAGNOSIS — N186 End stage renal disease: Secondary | ICD-10-CM | POA: Diagnosis not present

## 2020-09-11 DIAGNOSIS — Z992 Dependence on renal dialysis: Secondary | ICD-10-CM | POA: Diagnosis not present

## 2020-09-12 DIAGNOSIS — N186 End stage renal disease: Secondary | ICD-10-CM | POA: Diagnosis not present

## 2020-09-12 DIAGNOSIS — D631 Anemia in chronic kidney disease: Secondary | ICD-10-CM | POA: Diagnosis not present

## 2020-09-12 DIAGNOSIS — Z992 Dependence on renal dialysis: Secondary | ICD-10-CM | POA: Diagnosis not present

## 2020-09-13 DIAGNOSIS — N186 End stage renal disease: Secondary | ICD-10-CM | POA: Diagnosis not present

## 2020-09-13 DIAGNOSIS — Z992 Dependence on renal dialysis: Secondary | ICD-10-CM | POA: Diagnosis not present

## 2020-09-13 DIAGNOSIS — D631 Anemia in chronic kidney disease: Secondary | ICD-10-CM | POA: Diagnosis not present

## 2020-09-14 DIAGNOSIS — Z992 Dependence on renal dialysis: Secondary | ICD-10-CM | POA: Diagnosis not present

## 2020-09-14 DIAGNOSIS — D631 Anemia in chronic kidney disease: Secondary | ICD-10-CM | POA: Diagnosis not present

## 2020-09-14 DIAGNOSIS — N186 End stage renal disease: Secondary | ICD-10-CM | POA: Diagnosis not present

## 2020-09-16 DIAGNOSIS — N186 End stage renal disease: Secondary | ICD-10-CM | POA: Diagnosis not present

## 2020-09-16 DIAGNOSIS — D631 Anemia in chronic kidney disease: Secondary | ICD-10-CM | POA: Diagnosis not present

## 2020-09-16 DIAGNOSIS — Z992 Dependence on renal dialysis: Secondary | ICD-10-CM | POA: Diagnosis not present

## 2020-09-17 DIAGNOSIS — Z992 Dependence on renal dialysis: Secondary | ICD-10-CM | POA: Diagnosis not present

## 2020-09-17 DIAGNOSIS — N186 End stage renal disease: Secondary | ICD-10-CM | POA: Diagnosis not present

## 2020-09-17 DIAGNOSIS — D631 Anemia in chronic kidney disease: Secondary | ICD-10-CM | POA: Diagnosis not present

## 2020-09-18 DIAGNOSIS — Z992 Dependence on renal dialysis: Secondary | ICD-10-CM | POA: Diagnosis not present

## 2020-09-18 DIAGNOSIS — N186 End stage renal disease: Secondary | ICD-10-CM | POA: Diagnosis not present

## 2020-09-18 DIAGNOSIS — D631 Anemia in chronic kidney disease: Secondary | ICD-10-CM | POA: Diagnosis not present

## 2020-09-19 DIAGNOSIS — D631 Anemia in chronic kidney disease: Secondary | ICD-10-CM | POA: Diagnosis not present

## 2020-09-19 DIAGNOSIS — Z992 Dependence on renal dialysis: Secondary | ICD-10-CM | POA: Diagnosis not present

## 2020-09-19 DIAGNOSIS — N186 End stage renal disease: Secondary | ICD-10-CM | POA: Diagnosis not present

## 2020-09-20 DIAGNOSIS — N186 End stage renal disease: Secondary | ICD-10-CM | POA: Diagnosis not present

## 2020-09-20 DIAGNOSIS — D631 Anemia in chronic kidney disease: Secondary | ICD-10-CM | POA: Diagnosis not present

## 2020-09-20 DIAGNOSIS — Z992 Dependence on renal dialysis: Secondary | ICD-10-CM | POA: Diagnosis not present

## 2020-09-21 DIAGNOSIS — Z992 Dependence on renal dialysis: Secondary | ICD-10-CM | POA: Diagnosis not present

## 2020-09-21 DIAGNOSIS — D631 Anemia in chronic kidney disease: Secondary | ICD-10-CM | POA: Diagnosis not present

## 2020-09-21 DIAGNOSIS — N186 End stage renal disease: Secondary | ICD-10-CM | POA: Diagnosis not present

## 2020-10-01 ENCOUNTER — Other Ambulatory Visit: Payer: Self-pay

## 2020-10-01 ENCOUNTER — Emergency Department: Payer: Medicare Other

## 2020-10-01 ENCOUNTER — Encounter: Payer: Self-pay | Admitting: Emergency Medicine

## 2020-10-01 ENCOUNTER — Emergency Department
Admission: EM | Admit: 2020-10-01 | Discharge: 2020-10-01 | Disposition: A | Discharge status: Home / Self Care | Payer: Medicare Other | Attending: Emergency Medicine | Admitting: Emergency Medicine

## 2020-10-01 DIAGNOSIS — Z992 Dependence on renal dialysis: Secondary | ICD-10-CM | POA: Insufficient documentation

## 2020-10-01 DIAGNOSIS — Z79899 Other long term (current) drug therapy: Secondary | ICD-10-CM | POA: Insufficient documentation

## 2020-10-01 DIAGNOSIS — I120 Hypertensive chronic kidney disease with stage 5 chronic kidney disease or end stage renal disease: Secondary | ICD-10-CM | POA: Diagnosis not present

## 2020-10-01 DIAGNOSIS — T85611A Breakdown (mechanical) of intraperitoneal dialysis catheter, initial encounter: Secondary | ICD-10-CM

## 2020-10-01 DIAGNOSIS — T829XXA Unspecified complication of cardiac and vascular prosthetic device, implant and graft, initial encounter: Secondary | ICD-10-CM | POA: Diagnosis not present

## 2020-10-01 DIAGNOSIS — N186 End stage renal disease: Secondary | ICD-10-CM | POA: Diagnosis not present

## 2020-10-01 DIAGNOSIS — D631 Anemia in chronic kidney disease: Secondary | ICD-10-CM | POA: Diagnosis not present

## 2020-10-01 DIAGNOSIS — I10 Essential (primary) hypertension: Secondary | ICD-10-CM | POA: Diagnosis not present

## 2020-10-01 DIAGNOSIS — N185 Chronic kidney disease, stage 5: Secondary | ICD-10-CM | POA: Diagnosis not present

## 2020-10-01 DIAGNOSIS — Z87891 Personal history of nicotine dependence: Secondary | ICD-10-CM | POA: Insufficient documentation

## 2020-10-01 DIAGNOSIS — Z20822 Contact with and (suspected) exposure to covid-19: Secondary | ICD-10-CM | POA: Insufficient documentation

## 2020-10-01 LAB — CBC
HCT: 27.3 % — ABNORMAL LOW (ref 39.0–52.0)
Hemoglobin: 8.9 g/dL — ABNORMAL LOW (ref 13.0–17.0)
MCH: 31.7 pg (ref 26.0–34.0)
MCHC: 32.6 g/dL (ref 30.0–36.0)
MCV: 97.2 fL (ref 80.0–100.0)
Platelets: 274 10*3/uL (ref 150–400)
RBC: 2.81 MIL/uL — ABNORMAL LOW (ref 4.22–5.81)
RDW: 14.9 % (ref 11.5–15.5)
WBC: 10.1 10*3/uL (ref 4.0–10.5)
nRBC: 0.4 % — ABNORMAL HIGH (ref 0.0–0.2)

## 2020-10-01 LAB — RESP PANEL BY RT-PCR (FLU A&B, COVID) ARPGX2
Influenza A by PCR: NEGATIVE
Influenza B by PCR: NEGATIVE
SARS Coronavirus 2 by RT PCR: NEGATIVE

## 2020-10-01 LAB — BASIC METABOLIC PANEL
Anion gap: 19 — ABNORMAL HIGH (ref 5–15)
BUN: 121 mg/dL — ABNORMAL HIGH (ref 6–20)
CO2: 14 mmol/L — ABNORMAL LOW (ref 22–32)
Calcium: 7.6 mg/dL — ABNORMAL LOW (ref 8.9–10.3)
Chloride: 104 mmol/L (ref 98–111)
Creatinine, Ser: 19.19 mg/dL — ABNORMAL HIGH (ref 0.61–1.24)
GFR, Estimated: 3 mL/min — ABNORMAL LOW (ref >60)
Glucose, Bld: 106 mg/dL — ABNORMAL HIGH (ref 70–99)
Potassium: 4.2 mmol/L (ref 3.5–5.1)
Sodium: 137 mmol/L (ref 135–145)

## 2020-10-01 MED ORDER — CLONIDINE HCL 0.1 MG PO TABS
0.1000 mg | ORAL_TABLET | Freq: Once | ORAL | Status: AC
  Administered 2020-10-01: 0.1 mg via ORAL
  Filled 2020-10-01: qty 1

## 2020-10-01 NOTE — ED Provider Notes (Signed)
[facility] Emergency Department Provider Note   ____________________________________________   I have reviewed the triage vital signs and the nursing notes.   HISTORY  Chief Complaint PD Dialysis Catheter Problem   History limited by: Not Limited   HPI Larry [name] is a 55 y.o. male who presents to the emergency department today because of concern for malfunctioning PD catheter. The patient states that he has had this catheter in for the past two years. Was last able to instill fluid and draw it out on Thursday. However on Friday started having difficulty with instilling the fluid, went to dialysis center where they were able to place the fluid but not get it out. The patient denies any shortness of breath. Denies any fever or abdominal pain.   Records reviewed. Per medical record review patient has a history of anemia, CKD, HTN, nephrotic syndrome.   Past Medical History:  Diagnosis Date  . Anemia   . Chronic kidney disease   . Hypertension   . Nephrotic syndrome     Patient Active Problem List   Diagnosis Date Noted  . Stage 5 chronic kidney disease not on chronic dialysis ([place]) 12/28/2017  . Hydronephrosis 10/15/2017  . AKI (acute kidney injury) ([place]) 09/30/2017  . even 1 per today and did not I would not with an order 02/07/2015    Past Surgical History:  Procedure Laterality Date  . CAPD INSERTION N/A 01/06/2018   Procedure: LAPAROSCOPIC INSERTION CONTINUOUS AMBULATORY PERITONEAL DIALYSIS  (CAPD) CATHETER;  Surgeon: Katha Cabal, MD;  Location: [facility] ORS;  Service: Vascular;  Laterality: N/A;  . CHOLECYSTECTOMY    . CYSTOSCOPY W/ URETERAL STENT REMOVAL Right 10/23/2017   Procedure: CYSTOSCOPY WITH STENT REPLACEMENT;  Surgeon: [name], MD;  Location: [facility] ORS;  Service: Urology;  Laterality: Right;  . CYSTOSCOPY WITH STENT PLACEMENT Right 09/30/2017   Procedure: CYSTOSCOPY WITH STENT PLACEMENT;  Surgeon: [name], MD;   Location: [facility] ORS;  Service: Urology;  Laterality: Right;  . CYSTOSCOPY/RETROGRADE/URETEROSCOPY Right 10/23/2017   Procedure: CYSTOSCOPY/RETROGRADE/URETEROSCOPY;  Surgeon: [name], MD;  Location: [facility] ORS;  Service: Urology;  Laterality: Right;  . RENAL BIOPSY      Prior to Admission medications   Medication Sig Start Date End Date Taking? Authorizing Provider  famotidine (PEPCID) 20 MG tablet Take 1 tablet (20 mg total) by mouth 2 (two) times daily. Patient taking differently: Take 20 mg by mouth daily as needed for heartburn.  09/27/17 09/27/18  Darel Hong, MD  hydrALAZINE (APRESOLINE) 50 MG tablet Take 50 mg by mouth daily as needed (If diastolic bp is 90 or above).     [provider]  HYDROcodone-acetaminophen (NORCO) 5-325 MG tablet Take 1-2 tablets by mouth every 6 (six) hours as needed. Patient not taking: Reported on 01/21/2018 01/06/18   Schnier, Dolores Lory, MD  ibuprofen (ADVIL,MOTRIN) 200 MG tablet Take 400 mg by mouth daily as needed for headache or moderate pain.    [provider]  metoprolol succinate (TOPROL-XL) 100 MG 24 hr tablet Take 100 mg by mouth daily.    [provider]  nicotine (NICODERM CQ - DOSED IN MG/24 HOURS) 14 mg/24hr patch Place 14 mg onto the skin daily.    [provider]  oxybutynin (DITROPAN) 5 MG tablet Take by mouth. 01/18/18   [provider]  sertraline (ZOLOFT) 25 MG tablet Take 25 mg by mouth daily.  09/25/17   [provider]  sodium bicarbonate 650 MG tablet  Take 1,300 mg by mouth 2 (two) times daily.     [provider]    Allergies Mushroom extract complex and Amoxicillin  Family History  Problem Relation Age of Onset  . Diabetes Mother   . Kidney disease Mother        mother on dialysis  . CAD Mother   . Diabetes Sister   . CAD Sister     Social History Social History   Tobacco Use  . Smoking status: Former Smoker    Years: 20.00    Types: Cigarettes    Quit  date: 08/22/2017    Years since quitting: 3.1  . Smokeless tobacco: Never Used  Vaping Use  . Vaping Use: Never used  Substance Use Topics  . Alcohol use: Yes    Comment: occ. alcohol use  . Drug use: No    Review of Systems Constitutional: No fever/chills Eyes: No visual changes. ENT: No sore throat. Cardiovascular: Denies chest pain. Respiratory: Denies shortness of breath. Gastrointestinal: No abdominal pain.  No nausea, no vomiting.  No diarrhea.   Genitourinary: Negative for dysuria. Musculoskeletal: Negative for back pain. Skin: Negative for rash. Neurological: Negative for headaches, focal weakness or numbness.  ____________________________________________   PHYSICAL EXAM:  VITAL SIGNS: ED Triage Vitals  Enc Vitals Group     BP 10/01/20 1108 (!) 199/100     Pulse Rate 10/01/20 1108 83     Resp 10/01/20 1108 18     Temp 10/01/20 1108 98.5 F (36.9 C)     Temp Source 10/01/20 1108 Oral     SpO2 10/01/20 1108 97 %     Weight 10/01/20 1110 143 lb 4.8 oz (65 kg)     Height 10/01/20 1110 5\' 6"  (1.676 m)     Head Circumference --      Peak Flow --      Pain Score 10/01/20 1115 0   Constitutional: Alert and oriented.  Eyes: Conjunctivae are normal.  ENT      Head: Normocephalic and atraumatic.      Nose: No congestion/rhinnorhea.      Mouth/Throat: Mucous membranes are moist.      Neck: No stridor. Hematological/Lymphatic/Immunilogical: No cervical lymphadenopathy. Cardiovascular: Normal rate, regular rhythm.  No murmurs, rubs, or gallops.  Respiratory: Normal respiratory effort without tachypnea nor retractions. Breath sounds are clear and equal bilaterally. No wheezes/rales/rhonchi. Gastrointestinal: Soft and non tender. Peritoneal catheter in place in abdomen.  Genitourinary: Deferred Musculoskeletal: Normal range of motion in all extremities. No lower extremity edema. Neurologic:  Normal speech and language. No gross focal neurologic deficits are  appreciated.  Skin:  Skin is warm, dry and intact. No rash noted. Psychiatric: Mood and affect are normal. Speech and behavior are normal. Patient exhibits appropriate insight and judgment.  ____________________________________________    LABS (pertinent positives/negatives)  CBC wbc 10.1, hgb 8.9, plt 274 BMP na 137, k 4.2, glu 106, cr 19.19, ca 7.6  ____________________________________________   EKG  None  ____________________________________________    RADIOLOGY  None  ____________________________________________   PROCEDURES  Procedures  ____________________________________________   INITIAL IMPRESSION / ASSESSMENT AND PLAN / ED COURSE  Pertinent labs & imaging results that were available during my care of the patient were reviewed by me and considered in my medical decision making (see chart for details).   Patient presented to the ED today because of concern for dialysis catheter dysfunction. Abd x-ray without concerning findings. Talked with Dr. Candiss Norse with nephrology. Felt patient could follow up  tomorrow in specials with vascular to further manage catheter.    ____________________________________________   FINAL CLINICAL IMPRESSION(S) / ED DIAGNOSES  Final diagnoses:  Peritoneal dialysis catheter dysfunction, initial encounter [facility])     Note: This dictation was prepared with Dragon dictation. Any transcriptional errors that result from this process are unintentional     Nance Pear, MD 10/01/20 1943

## 2020-10-01 NOTE — Discharge Instructions (Addendum)
Please return tomorrow to have your PD catheter further evaluated. You can contact Dr. Nino Parsley office in the morning for more information, but they would like you to come to [facility] center.

## 2020-10-01 NOTE — H&P (View-Only) (Signed)
[facility], [place] 10/01/20  Subjective:   LOS: 0 No intake/output data recorded. Patient known to our practice from outpatient dialysis.  Reports that he is unable to drain his PD fluid since January 6 which is Thursday.  Outpatient PD nurse tried TPA instillation and heparin solution without success.  He is now referred to the ER for further evaluation and management   Objective:  Vital signs in last 24 hours:  Temp:  [98.5 F (36.9 C)] 98.5 F (36.9 C) (01/10 1108) Pulse Rate:  [83] 83 (01/10 1108) Resp:  [18] 18 (01/10 1108) BP: (199)/(100) 199/100 (01/10 1108) SpO2:  [97 %] 97 % (01/10 1108) Weight:  [65 kg] 65 kg (01/10 1110)  Weight change:  Filed Weights   10/01/20 1110  Weight: 65 kg    Intake/Output:   No intake or output data in the 24 hours ending 10/01/20 1703  Physical Exam: General:  No acute distress, laying in the bed  HEENT  anicteric, moist oral mucous membrane  Pulm/lungs  normal breathing effort, lungs are clear to auscultation  CVS/Heart  regular rhythm, no rub or gallop  Abdomen:   Soft, nontender  Extremities:  No peripheral edema  Neurologic:  Alert, oriented, able to follow commands  Skin:  No acute rashes   Basic Metabolic Panel:  Recent Labs  Lab 10/01/20 1128  NA 137  K 4.2  CL 104  CO2 14*  GLUCOSE 106*  BUN 121*  CREATININE 19.19*  CALCIUM 7.6*     CBC: Recent Labs  Lab 10/01/20 1128  WBC 10.1  HGB 8.9*  HCT 27.3*  MCV 97.2  PLT 274     No results found for: HEPBSAG, HEPBSAB, HEPBIGM    Microbiology:  No results found for this or any previous visit (from the past 240 hour(s)).  Coagulation Studies: No results for input(s): LABPROT, INR in the last 72 hours.  Urinalysis: No results for input(s): COLORURINE, LABSPEC, PHURINE, GLUCOSEU, HGBUR, BILIRUBINUR, KETONESUR, PROTEINUR, UROBILINOGEN, NITRITE, LEUKOCYTESUR in the last 72 hours.  Invalid input(s): APPERANCEUR     Imaging: DG Abd Portable 2 Views  Result Date: 10/01/2020 CLINICAL DATA:  PD catheter evaluation EXAM: PORTABLE ABDOMEN - 2 VIEW COMPARISON:  02/12/2018 and prior. FINDINGS: Nonobstructive bowel gas pattern. Mild colonic stool burden. Peritoneal dialysis catheter loops within the midline pelvis. Sequela of cholecystectomy. No suspicious calcific densities. No acute osseous abnormality. IMPRESSION: Peritoneal dialysis catheter tip overlies the midline pelvis. Nonobstructive bowel gas pattern. Electronically Signed   By: Primitivo Gauze M.D.   On: 10/01/2020 16:37     Medications:       Assessment/ Plan:  55 y.o. male with end-stage renal disease on peritoneal dialysis since 2019, history of kidney stone, hypertension  Active Problems:   * No active hospital problems. *   #.  End-stage renal disease On peritoneal dialysis.  Unable to do PD because of dialysis device and catheter malfunction. TPA, heparin tried as outpatient without success Abdominal x-ray shows nonobstructive gas pattern.  Mild colonic stool burden.  PD catheter removed within the midline pelvis. -Case discussed with Dr. Delana Meyer by phone.  Patient can return to the specials area tomorrow morning for PD catheter TPA infusion. -Obtain Covid test tonight  #. Anemia of CKD  Lab Results  Component Value Date   HGB 8.9 (L) 10/01/2020    #. SHPTH  No results found for: PTH Lab Results  Component Value Date   PHOS 3.0 01/01/2015    #.  HTN with CKD : Blood pressure 199/100. Outpatient regimen include hydralazine 50 mg Toprol 100 g daily We will administer clonidine Po x 1    LOS: 0 Larry [name] 1/10/20225:03 PM  [facility] [place], [place]

## 2020-10-01 NOTE — ED Notes (Signed)
Pt given meal tray and drink. Given ok by MD

## 2020-10-01 NOTE — ED Triage Notes (Signed)
First Nurse Note:  Arrives with c/o PD catheter not functioning correctly.  States since last Thursday, able to put fluid in for dwell, but unable to drain.  Seen at [facility] on Friday where they tried to correct problem, unsuccessfully.  Patient is AAOx3.  Skin warm and dry. NAD

## 2020-10-01 NOTE — ED Triage Notes (Signed)
See first RN Note; pt states last full PD dialysis on 09/28/19. Pt states Dr. Holley Raring aware and directed him to ED. Pt states 2,014mL infused but never came out.

## 2020-10-01 NOTE — ED Notes (Signed)
Pt in hallway bed, no e signature tablet available. Pt educated on discharge instructions and verbalized understanding.

## 2020-10-01 NOTE — Progress Notes (Signed)
[facility], [place] 10/01/20  Subjective:   LOS: 0 No intake/output data recorded. Patient known to our practice from outpatient dialysis.  Reports that he is unable to drain his PD fluid since January 6 which is Thursday.  Outpatient PD nurse tried TPA instillation and heparin solution without success.  He is now referred to the ER for further evaluation and management   Objective:  Vital signs in last 24 hours:  Temp:  [98.5 F (36.9 C)] 98.5 F (36.9 C) (01/10 1108) Pulse Rate:  [83] 83 (01/10 1108) Resp:  [18] 18 (01/10 1108) BP: (199)/(100) 199/100 (01/10 1108) SpO2:  [97 %] 97 % (01/10 1108) Weight:  [65 kg] 65 kg (01/10 1110)  Weight change:  Filed Weights   10/01/20 1110  Weight: 65 kg    Intake/Output:   No intake or output data in the 24 hours ending 10/01/20 1703  Physical Exam: General:  No acute distress, laying in the bed  HEENT  anicteric, moist oral mucous membrane  Pulm/lungs  normal breathing effort, lungs are clear to auscultation  CVS/Heart  regular rhythm, no rub or gallop  Abdomen:   Soft, nontender  Extremities:  No peripheral edema  Neurologic:  Alert, oriented, able to follow commands  Skin:  No acute rashes   Basic Metabolic Panel:  Recent Labs  Lab 10/01/20 1128  NA 137  K 4.2  CL 104  CO2 14*  GLUCOSE 106*  BUN 121*  CREATININE 19.19*  CALCIUM 7.6*     CBC: Recent Labs  Lab 10/01/20 1128  WBC 10.1  HGB 8.9*  HCT 27.3*  MCV 97.2  PLT 274     No results found for: HEPBSAG, HEPBSAB, HEPBIGM    Microbiology:  No results found for this or any previous visit (from the past 240 hour(s)).  Coagulation Studies: No results for input(s): LABPROT, INR in the last 72 hours.  Urinalysis: No results for input(s): COLORURINE, LABSPEC, PHURINE, GLUCOSEU, HGBUR, BILIRUBINUR, KETONESUR, PROTEINUR, UROBILINOGEN, NITRITE, LEUKOCYTESUR in the last 72 hours.  Invalid input(s): APPERANCEUR     Imaging: DG Abd Portable 2 Views  Result Date: 10/01/2020 CLINICAL DATA:  PD catheter evaluation EXAM: PORTABLE ABDOMEN - 2 VIEW COMPARISON:  02/12/2018 and prior. FINDINGS: Nonobstructive bowel gas pattern. Mild colonic stool burden. Peritoneal dialysis catheter loops within the midline pelvis. Sequela of cholecystectomy. No suspicious calcific densities. No acute osseous abnormality. IMPRESSION: Peritoneal dialysis catheter tip overlies the midline pelvis. Nonobstructive bowel gas pattern. Electronically Signed   By: Primitivo Gauze M.D.   On: 10/01/2020 16:37     Medications:       Assessment/ Plan:  55 y.o. male with end-stage renal disease on peritoneal dialysis since 2019, history of kidney stone, hypertension  Active Problems:   * No active hospital problems. *   #.  End-stage renal disease On peritoneal dialysis.  Unable to do PD because of dialysis device and catheter malfunction. TPA, heparin tried as outpatient without success Abdominal x-ray shows nonobstructive gas pattern.  Mild colonic stool burden.  PD catheter removed within the midline pelvis. -Case discussed with Dr. Delana Meyer by phone.  Patient can return to the specials area tomorrow morning for PD catheter TPA infusion. -Obtain Covid test tonight  #. Anemia of CKD  Lab Results  Component Value Date   HGB 8.9 (L) 10/01/2020    #. SHPTH  No results found for: PTH Lab Results  Component Value Date   PHOS 3.0 01/01/2015    #.  HTN with CKD : Blood pressure 199/100. Outpatient regimen include hydralazine 50 mg Toprol 100 g daily We will administer clonidine Po x 1    LOS: 0 Larry [name] 1/10/20225:03 PM  [facility] [place], [place]

## 2020-10-02 ENCOUNTER — Inpatient Hospital Stay
Admission: RE | Admit: 2020-10-02 | Discharge: 2020-10-02 | Disposition: A | Discharge status: Home / Self Care | Payer: 59 | Source: Ambulatory Visit

## 2020-10-02 ENCOUNTER — Ambulatory Visit
Admission: RE | Admit: 2020-10-02 | Discharge: 2020-10-02 | Disposition: A | Discharge status: Home / Self Care | Payer: Medicare Other | Attending: Vascular Surgery | Admitting: Vascular Surgery

## 2020-10-02 ENCOUNTER — Telehealth (INDEPENDENT_AMBULATORY_CARE_PROVIDER_SITE_OTHER): Payer: Self-pay

## 2020-10-02 ENCOUNTER — Other Ambulatory Visit (INDEPENDENT_AMBULATORY_CARE_PROVIDER_SITE_OTHER): Payer: Self-pay | Admitting: Nurse Practitioner

## 2020-10-02 ENCOUNTER — Other Ambulatory Visit (INDEPENDENT_AMBULATORY_CARE_PROVIDER_SITE_OTHER): Payer: Self-pay | Admitting: Vascular Surgery

## 2020-10-02 DIAGNOSIS — Z992 Dependence on renal dialysis: Secondary | ICD-10-CM

## 2020-10-02 DIAGNOSIS — T829XXA Unspecified complication of cardiac and vascular prosthetic device, implant and graft, initial encounter: Secondary | ICD-10-CM | POA: Diagnosis not present

## 2020-10-02 DIAGNOSIS — T829XXS Unspecified complication of cardiac and vascular prosthetic device, implant and graft, sequela: Secondary | ICD-10-CM | POA: Diagnosis not present

## 2020-10-02 DIAGNOSIS — I120 Hypertensive chronic kidney disease with stage 5 chronic kidney disease or end stage renal disease: Secondary | ICD-10-CM | POA: Insufficient documentation

## 2020-10-02 DIAGNOSIS — Z87891 Personal history of nicotine dependence: Secondary | ICD-10-CM | POA: Insufficient documentation

## 2020-10-02 DIAGNOSIS — T82898A Other specified complication of vascular prosthetic devices, implants and grafts, initial encounter: Secondary | ICD-10-CM

## 2020-10-02 DIAGNOSIS — Z880 Allergy status to penicillin: Secondary | ICD-10-CM | POA: Insufficient documentation

## 2020-10-02 DIAGNOSIS — N186 End stage renal disease: Secondary | ICD-10-CM

## 2020-10-02 DIAGNOSIS — Y841 Kidney dialysis as the cause of abnormal reaction of the patient, or of later complication, without mention of misadventure at the time of the procedure: Secondary | ICD-10-CM | POA: Insufficient documentation

## 2020-10-02 DIAGNOSIS — T82868A Thrombosis of vascular prosthetic devices, implants and grafts, initial encounter: Secondary | ICD-10-CM | POA: Insufficient documentation

## 2020-10-02 SURGERY — DIALYSIS/PERMA CATHETER REPAIR

## 2020-10-02 MED ORDER — ALTEPLASE 2 MG IJ SOLR
4.0000 mg | Freq: Once | INTRAMUSCULAR | Status: AC
  Administered 2020-10-02: 4 mg

## 2020-10-02 MED ORDER — HEPARIN SOD (PORK) LOCK FLUSH 100 UNIT/ML IV SOLN
INTRAVENOUS | Status: AC
  Filled 2020-10-02: qty 5

## 2020-10-02 MED ORDER — ALTEPLASE 2 MG IJ SOLR
INTRAMUSCULAR | Status: AC
  Filled 2020-10-02: qty 4

## 2020-10-02 NOTE — Progress Notes (Signed)
Pt laying in bed comfortably. Pt has 4mg  of alteplase in 78ml of NS infusing through his peritoneal catheter. Pt is alert and oriented. No signs of distress. Pt is eating and drinking. VSS.

## 2020-10-02 NOTE — Pre-Procedure Instructions (Signed)
Unable to reach patient for pre op interview. Generic message left on voice mail.

## 2020-10-02 NOTE — Discharge Instructions (Signed)
Please try to run your peritoneal dialysis this evening. If you are unable to dialyze please call our office in the a.m. we will place you on the operating room schedule on Thursday.  Peritoneal Dialysis Information Dialysis is a procedure that does some of the work healthy kidneys do. Peritoneal dialysis uses the thin lining of the belly (peritoneum) and a fluid called dialysate to remove wastes, salt, and extra water from the blood. Tell a health care provider about:  Any allergies you have.  All medicines you are taking. This includes vitamins, herbs, eye drops, creams, and over-the-counter medicines.  Any problems you or family members have had with anesthetic medicines.  Any blood disorders you have.  Any surgeries you have.  Any medical conditions you have.  Whether you are pregnant or may be pregnant. What are the risks? Generally, this treatment is safe. But, problems may occur, including:  Infection of the lining of the belly.  Infection around the tube (catheter) that was inserted.  Pain in the area where the tube was inserted.  Weak muscles in the belly area. What happens before the procedure? Take steps to prevent infection. Your doctor will tell you what to do before treatment. You may have to:  Put on a mask.  Close doors and windows in your room.  Wash your hands before and during treatment. Anyone who touches you or the machine must also wash hands often.  Make sure that the machine and tubing do not have germs (are sterile).  Check the bag of dialysate. Make sure it is sealed and free of germs. What happens during the procedure? At the start of each treatment, your belly will be filled with dialysate. The dialysate pulls waste, salt, and water through the lining of the belly. At the end of each treatment, the dialysate, salt, and water will be drained from your body. The treatment will be done using one of these methods:  Continuous cycling peritoneal  dialysis (CCPD). In this type, a machine fills and drains your belly while you sleep.  Continuous ambulatory peritoneal dialysis (CAPD). This type is done up to 5 times a day. Each treatment takes about 30-40 minutes. You may do your normal activities between treatments. In some cases, both CCPD and CAPD are used.   What can I expect after the procedure?  Lab tests may be done to check how your treatment is working.  Change the bandage (dressing) around your tube as told by your doctor. Keep the bandage clean and dry.  Weigh yourself after the treatment and write down your weight. Follow these instructions at home: Eating and drinking Follow what your doctor tells you about eating and drinking. Your diet plan should include:  Talking with a food expert (dietitian).  Vitamin supplements.  Good proteins. This includes meat, poultry, fish, and eggs. You may need to eat a high-protein diet. Preventing constipation Take steps to prevent problems when pooping (constipation):  Eat foods that are high in fiber. This includes beans, whole grains, and fresh fruits and vegetables.  Limit foods that are high in fat and sugars. This includes fried or sweet foods.  Be active.  Go to the bathroom when you need to. Do not hold it in.  Take medicines to treat this problem only if your doctor tells you to. General instructions  Keep a strict schedule. The treatment must be done every day. Do not skip a day or a treatment. Make time for each treatment.  Keep all supplies  in a cool, clean, and dry place.  Take all medicines only as told by your doctor.  Weigh yourself every day. Sudden weight gain may be a sign of a problem.  Keep all follow-up visits. Where to find more information  [employer] of Diabetes and Digestive and Kidney Diseases: [employer]  National Kidney Foundation: www.kidney.org Contact a doctor if:  You have a fever or chills.  You vomit.  You feel  like you may vomit.  You have watery poop (diarrhea).  You have problems doing the treatment.  Your blood pressure goes up.  You gain weight in a short time.  You feel short of breath all of a sudden.  The tube in your belly seems loose or feels like it is coming out.  The fluid from your belly is pinkish or reddish. Women who are having their period do not need to get help if the fluid is only a little pink or a little red.  There are white strands in the dialysate. The strands can get stuck in your tubing. Get help right away if:  The area around the tube in your belly swells or gets red, tender, or painful.  There is pus coming from the area around the tube in your belly.  The fluid from your belly is cloudy.  You have more belly pain. Summary  Peritoneal dialysis does some of the work healthy kidneys do.  CAPD and CCPD are the two ways of doing dialysis. Your doctor will help you decide which type is best for you. This information is not intended to replace advice given to you by your health care provider. Make sure you discuss any questions you have with your health care provider. Document Revised: 04/26/2020 Document Reviewed: 04/26/2020 Elsevier Patient Education  2021 [employer].

## 2020-10-02 NOTE — Progress Notes (Signed)
Pt up to the bathroom

## 2020-10-02 NOTE — Op Note (Signed)
[facility]   OPERATIVE NOTE   PROCEDURE: Continuous infusion of TPA for nonfunctional peritoneal dialysis catheter  PRE-OPERATIVE DIAGNOSIS:  1. ESRD 2. Nonfunctional peritoneal dialysis catheter  POST-OPERATIVE DIAGNOSIS:  1. ESRD Nonfunctional peritoneal dialysis catheter  Physician Assistant: Hezzie Bump PA-C  SURGEON: Ella Jubilee, MD  ANESTHESIA: None  ESTIMATED BLOOD LOSS: Minimal  FINDING(S): 1.  Catheter flushed easily after infusion  SPECIMEN(S):  None  INDICATIONS:   Larry [name] is a 55 y.o. male who presents with a nonfunctional peritoneal dialysis catheter.  We are performing a TPA infusion to try to open the catheter and salvage this for use.  Risks and benefits were discussed.  DESCRIPTION: After obtaining full informed written consent, the patient is brought to the vascular and interventional radiology area. 4mg  of alteplase is infused over a four-hour period through the lumen of the peritoneal catheter. At the conclusion of the infusion, we sterilely withdrew flushed easily with sterile saline. A concentrated heparin solution was then placed in the lumen and a new sterile cap was placed. The patient tolerated the procedure well.  COMPLICATIONS: None  CONDITION: Stable  [name] [name]  10/02/2020, 12:49 PM

## 2020-10-02 NOTE — Telephone Encounter (Signed)
Spoke with the patient regarding his surgery on 10/03/20 with Dr. Lucky Cowboy for a PD cath revision. Patient was told to call same day surgery to get his arrival time for tomorrow and to be NPO after midnight tonight as well. Patient had a covid test on 10/01/20 and pre-op today.

## 2020-10-02 NOTE — H&P (Signed)
[facility] Admission History & Physical  MRN : [id number]  Larry [name] is a 55 y.o. (February 26, 1966) male who presents with chief complaint of scheduled peritoneal dialysis catheter tPA infusion.  History of Present Illness:  I am asked to evaluate the patient by the dialysis center / Dr. Candiss Norse. The patient was sent here because they were unable to dialyze appropriately through his peritoneal dialysis catheter. The patient states this is the first dialysis run to be missed. This problem is acute in onset and has been present for approximately 2 days. The patient is unaware of any other change. Patient denies pain or tenderness overlying the access.  There is no pain with dialysis.    Current Facility-Administered Medications  Medication Dose Route Frequency Provider Last Rate Last Admin  . alteplase (CATHFLO ACTIVASE) injection 4 mg  4 mg Intracatheter Once [employer], Janalyn Harder, PA-C       Facility-Administered Medications Ordered in Other Encounters  Medication Dose Route Frequency Provider Last Rate Last Admin  . 0.9 %  sodium chloride infusion   Intravenous Continuous [name], MD 10 mL/hr at 06/14/15 1412 New Bag at 06/14/15 1412   Past Medical History:  Diagnosis Date  . Anemia   . Chronic kidney disease   . Hypertension   . Nephrotic syndrome    Past Surgical History:  Procedure Laterality Date  . CAPD INSERTION N/A 01/06/2018   Procedure: LAPAROSCOPIC INSERTION CONTINUOUS AMBULATORY PERITONEAL DIALYSIS  (CAPD) CATHETER;  Surgeon: Katha Cabal, MD;  Location: [facility] ORS;  Service: Vascular;  Laterality: N/A;  . CHOLECYSTECTOMY    . CYSTOSCOPY W/ URETERAL STENT REMOVAL Right 10/23/2017   Procedure: CYSTOSCOPY WITH STENT REPLACEMENT;  Surgeon: [name], MD;  Location: [facility] ORS;  Service: Urology;  Laterality: Right;  . CYSTOSCOPY WITH STENT PLACEMENT Right 09/30/2017   Procedure: CYSTOSCOPY WITH STENT PLACEMENT;  Surgeon: [name], MD;  Location: [facility] ORS;  Service: Urology;  Laterality: Right;  . CYSTOSCOPY/RETROGRADE/URETEROSCOPY Right 10/23/2017   Procedure: CYSTOSCOPY/RETROGRADE/URETEROSCOPY;  Surgeon: [name], MD;  Location: [facility] ORS;  Service: Urology;  Laterality: Right;  . RENAL BIOPSY     Social History Social History   Tobacco Use  . Smoking status: Former Smoker    Years: 20.00    Types: Cigarettes    Quit date: 08/22/2017    Years since quitting: 3.1  . Smokeless tobacco: Never Used  Vaping Use  . Vaping Use: Never used  Substance Use Topics  . Alcohol use: Yes    Comment: occ. alcohol use  . Drug use: No   Family History Family History  Problem Relation Age of Onset  . Diabetes Mother   . Kidney disease Mother        mother on dialysis  . CAD Mother   . Diabetes Sister   . CAD Sister   No family history of bleeding or clotting disorders, autoimmune disease or porphyria.  Allergies  Allergen Reactions  . Mushroom Extract Complex     Headache, migraines  . Amoxicillin Nausea And Vomiting    Weakness  Has patient had a PCN reaction causing immediate rash, facial/tongue/throat swelling, SOB or lightheadedness with hypotension: No Has patient had a PCN reaction causing severe rash involving mucus membranes or skin necrosis: No Has patient had a PCN reaction that required hospitalization: No Has patient had a PCN reaction occurring within the last 10 years: No If all of the above answers are "NO", then may  proceed with Cephalosporin use.    REVIEW OF SYSTEMS (Negative unless checked)  Constitutional: [] Weight loss  [] Fever  [] Chills Cardiac: [] Chest pain   [] Chest pressure   [] Palpitations   [] Shortness of breath when laying flat   [] Shortness of breath at rest   [x] Shortness of breath with exertion. Vascular:  [] Pain in legs with walking   [] Pain in legs at rest   [] Pain in legs when laying flat   [] Claudication   [] Pain in feet when walking  [] Pain in feet at rest   [] Pain in feet when laying flat   [] History of DVT   [] Phlebitis   [] Swelling in legs   [] Varicose veins   [] Non-healing ulcers Pulmonary:   [] Uses home oxygen   [] Productive cough   [] Hemoptysis   [] Wheeze  [] COPD   [] Asthma Neurologic:  [] Dizziness  [] Blackouts   [] Seizures   [] History of stroke   [] History of TIA  [] Aphasia   [] Temporary blindness   [] Dysphagia   [] Weakness or numbness in arms   [] Weakness or numbness in legs Musculoskeletal:  [] Arthritis   [] Joint swelling   [] Joint pain   [] Low back pain Hematologic:  [] Easy bruising  [] Easy bleeding   [] Hypercoagulable state   [] Anemic  [] Hepatitis Gastrointestinal:  [] Blood in stool   [] Vomiting blood  [] Gastroesophageal reflux/heartburn   [] Difficulty swallowing. Genitourinary:  [x] Chronic kidney disease   [] Difficult urination  [] Frequent urination  [] Burning with urination   [] Blood in urine Skin:  [] Rashes   [] Ulcers   [] Wounds Psychological:  [] History of anxiety   []  History of major depression.  Physical Examination  There were no vitals filed for this visit. There is no height or weight on file to calculate BMI. Gen: WD/WN, NAD Head: [place]/AT, No temporalis wasting. Prominent temp pulse not noted. Ear/Nose/Throat: Hearing grossly intact, nares w/o erythema or drainage, oropharynx w/o Erythema/Exudate,  Eyes: Conjunctiva clear, sclera non-icteric Neck: Trachea midline.  No JVD.  Pulmonary:  Good air movement, respirations not labored, no use of accessory muscles.  Cardiac: RRR, normal S1, S2. Vascular:  Vessel Right Left  Radial Palpable Palpable  Ulnar Not Palpable Not Palpable  Brachial Palpable Palpable  Carotid Palpable, without bruit Palpable, without bruit   Peritoneal dialysis catheter: Intact.  Entrance site clean dry and intact.  No signs of infection noted.  Gastrointestinal: soft, non-tender/non-distended. No guarding/reflex.  Musculoskeletal: M/S 5/5 throughout.  Extremities without ischemic changes.  No  deformity or atrophy.  Neurologic: Sensation grossly intact in extremities.  Symmetrical.  Speech is fluent. Motor exam as listed above. Psychiatric: Judgment intact, Mood & affect appropriate for pt's clinical situation. Dermatologic: No rashes or ulcers noted.  No cellulitis or open wounds. Lymph : No Cervical, Axillary, or Inguinal lymphadenopathy.  CBC Lab Results  Component Value Date   WBC 10.1 10/01/2020   HGB 8.9 (L) 10/01/2020   HCT 27.3 (L) 10/01/2020   MCV 97.2 10/01/2020   PLT 274 10/01/2020   BMET    Component Value Date/Time   NA 137 10/01/2020 1128   NA 138 01/19/2015 0545   K 4.2 10/01/2020 1128   K 3.9 01/19/2015 0545   CL 104 10/01/2020 1128   CL 114 (H) 01/19/2015 0545   CO2 14 (L) 10/01/2020 1128   CO2 18 (L) 01/19/2015 0545   GLUCOSE 106 (H) 10/01/2020 1128   GLUCOSE 95 01/19/2015 0545   BUN 121 (H) 10/01/2020 1128   BUN 48 (H) 01/19/2015 0545   CREATININE 19.19 (H) 10/01/2020 1128  CREATININE 5.66 (H) 01/19/2015 0545   CALCIUM 7.6 (L) 10/01/2020 1128   CALCIUM 8.0 (L) 01/19/2015 0545   GFRNONAA 3 (L) 10/01/2020 1128   GFRNONAA 11 (L) 01/19/2015 0545   GFRAA 21 (L) 12/14/2018 1430   GFRAA 13 (L) 01/19/2015 0545   Estimated Creatinine Clearance: 4 mL/min (A) (by C-G formula based on SCr of 19.19 mg/dL (H)).  COAG Lab Results  Component Value Date   INR 0.86 12/31/2017   Radiology DG Abd Portable 2 Views  Result Date: 10/01/2020 CLINICAL DATA:  PD catheter evaluation EXAM: PORTABLE ABDOMEN - 2 VIEW COMPARISON:  02/12/2018 and prior. FINDINGS: Nonobstructive bowel gas pattern. Mild colonic stool burden. Peritoneal dialysis catheter loops within the midline pelvis. Sequela of cholecystectomy. No suspicious calcific densities. No acute osseous abnormality. IMPRESSION: Peritoneal dialysis catheter tip overlies the midline pelvis. Nonobstructive bowel gas pattern. Electronically Signed   By: Primitivo Gauze M.D.   On: 10/01/2020 16:37    Assessment/Plan 1.  Complication dialysis device with thrombosis AV access:  Patient's peritoneal dialysis catheter is thrombosed.  Patient underwent an abdominal x-ray which was notable for the end of the catheter to be in the pelvis.  Patient will undergo tPA to his peritoneal dialysis catheter line and attempt to restore function.  Procedure, risks and benefit was explained to the patient.  All questions were answered.  The patient wishes to proceed.  2.  End-stage renal disease requiring peritoneal dialysis:  Patient will continue dialysis therapy without further interruption if a successful tPA is not achieved then catheter will be replaced.   3.  Hypertension:  Patient will continue medical management; nephrology is following no changes in oral medications.  Discussed with Dr. Francene Castle, PA-C  10/02/2020 9:21 AM

## 2020-10-03 ENCOUNTER — Ambulatory Visit
Admission: RE | Admit: 2020-10-03 | Discharge: 2020-10-03 | Disposition: A | Discharge status: Home / Self Care | Payer: Medicare Other | Attending: Vascular Surgery | Admitting: Vascular Surgery

## 2020-10-03 ENCOUNTER — Other Ambulatory Visit: Payer: Self-pay

## 2020-10-03 ENCOUNTER — Ambulatory Visit: Payer: Medicare Other | Admitting: Anesthesiology

## 2020-10-03 ENCOUNTER — Encounter
Admission: RE | Disposition: A | Discharge status: Home / Self Care | Payer: Self-pay | Source: Home / Self Care | Attending: Vascular Surgery

## 2020-10-03 ENCOUNTER — Encounter: Payer: Self-pay | Admitting: Vascular Surgery

## 2020-10-03 DIAGNOSIS — D631 Anemia in chronic kidney disease: Secondary | ICD-10-CM | POA: Insufficient documentation

## 2020-10-03 DIAGNOSIS — Z87891 Personal history of nicotine dependence: Secondary | ICD-10-CM | POA: Diagnosis not present

## 2020-10-03 DIAGNOSIS — Z992 Dependence on renal dialysis: Secondary | ICD-10-CM | POA: Insufficient documentation

## 2020-10-03 DIAGNOSIS — Y832 Surgical operation with anastomosis, bypass or graft as the cause of abnormal reaction of the patient, or of later complication, without mention of misadventure at the time of the procedure: Secondary | ICD-10-CM | POA: Insufficient documentation

## 2020-10-03 DIAGNOSIS — T85611A Breakdown (mechanical) of intraperitoneal dialysis catheter, initial encounter: Secondary | ICD-10-CM | POA: Insufficient documentation

## 2020-10-03 DIAGNOSIS — I120 Hypertensive chronic kidney disease with stage 5 chronic kidney disease or end stage renal disease: Secondary | ICD-10-CM | POA: Diagnosis not present

## 2020-10-03 DIAGNOSIS — N186 End stage renal disease: Secondary | ICD-10-CM

## 2020-10-03 DIAGNOSIS — Z87442 Personal history of urinary calculi: Secondary | ICD-10-CM | POA: Insufficient documentation

## 2020-10-03 DIAGNOSIS — T82898A Other specified complication of vascular prosthetic devices, implants and grafts, initial encounter: Secondary | ICD-10-CM

## 2020-10-03 LAB — APTT: aPTT: 29 seconds (ref 24–36)

## 2020-10-03 LAB — POCT I-STAT, CHEM 8
BUN: >130 mg/dL — ABNORMAL HIGH (ref 6–20)
Calcium, Ion: 1.07 mmol/L — ABNORMAL LOW (ref 1.15–1.40)
Chloride: 107 mmol/L (ref 98–111)
Creatinine, Ser: >18 mg/dL — ABNORMAL HIGH (ref 0.61–1.24)
Glucose, Bld: 87 mg/dL (ref 70–99)
HCT: 27 % — ABNORMAL LOW (ref 39.0–52.0)
Hemoglobin: 9.2 g/dL — ABNORMAL LOW (ref 13.0–17.0)
Potassium: 3.9 mmol/L (ref 3.5–5.1)
Sodium: 138 mmol/L (ref 135–145)
TCO2: 15 mmol/L — ABNORMAL LOW (ref 22–32)

## 2020-10-03 LAB — TYPE AND SCREEN
ABO/RH(D): A POS
Antibody Screen: NEGATIVE

## 2020-10-03 LAB — PROTIME-INR
INR: 1.1 (ref 0.8–1.2)
Prothrombin Time: 13.7 seconds (ref 11.4–15.2)

## 2020-10-03 SURGERY — REVISION, CATHETER, CAPD, LAPAROSCOPIC
Anesthesia: General

## 2020-10-03 MED ORDER — SUCCINYLCHOLINE CHLORIDE 200 MG/10ML IV SOSY
PREFILLED_SYRINGE | INTRAVENOUS | Status: AC
  Filled 2020-10-03: qty 10

## 2020-10-03 MED ORDER — CHLORHEXIDINE GLUCONATE CLOTH 2 % EX PADS: 6.0000 | MEDICATED_PAD | Freq: Once | CUTANEOUS | Status: DC

## 2020-10-03 MED ORDER — DEXAMETHASONE SODIUM PHOSPHATE 10 MG/ML IJ SOLN
INTRAMUSCULAR | Status: DC | PRN
  Administered 2020-10-03: 10 mg via INTRAVENOUS

## 2020-10-03 MED ORDER — LABETALOL HCL 5 MG/ML IV SOLN
INTRAVENOUS | Status: AC
  Filled 2020-10-03: qty 4

## 2020-10-03 MED ORDER — GLYCOPYRROLATE 0.2 MG/ML IJ SOLN
INTRAMUSCULAR | Status: AC
  Filled 2020-10-03: qty 1

## 2020-10-03 MED ORDER — PROPOFOL 10 MG/ML IV BOLUS
INTRAVENOUS | Status: DC | PRN
  Administered 2020-10-03: 30 mg via INTRAVENOUS
  Administered 2020-10-03: 150 mg via INTRAVENOUS

## 2020-10-03 MED ORDER — NEOSTIGMINE METHYLSULFATE 10 MG/10ML IV SOLN
INTRAVENOUS | Status: DC | PRN
  Administered 2020-10-03: 4 mg via INTRAVENOUS

## 2020-10-03 MED ORDER — LIDOCAINE HCL (PF) 2 % IJ SOLN
INTRAMUSCULAR | Status: AC
  Filled 2020-10-03: qty 5

## 2020-10-03 MED ORDER — LORAZEPAM 2 MG/ML IJ SOLN: 1.0000 mg | Freq: Once | INTRAMUSCULAR | Status: DC | PRN

## 2020-10-03 MED ORDER — ROCURONIUM BROMIDE 10 MG/ML (PF) SYRINGE
PREFILLED_SYRINGE | INTRAVENOUS | Status: AC
  Filled 2020-10-03: qty 10

## 2020-10-03 MED ORDER — ORAL CARE MOUTH RINSE: 15.0000 mL | Freq: Once | OROMUCOSAL | Status: AC

## 2020-10-03 MED ORDER — CHLORHEXIDINE GLUCONATE 0.12 % MT SOLN
OROMUCOSAL | Status: AC
  Administered 2020-10-03: 15 mL via OROMUCOSAL
  Filled 2020-10-03: qty 15

## 2020-10-03 MED ORDER — NEOSTIGMINE METHYLSULFATE 10 MG/10ML IV SOLN
INTRAVENOUS | Status: AC
  Filled 2020-10-03: qty 1

## 2020-10-03 MED ORDER — OXYCODONE HCL 5 MG PO TABS: 5.0000 mg | ORAL_TABLET | Freq: Once | ORAL | Status: AC | PRN

## 2020-10-03 MED ORDER — LIDOCAINE HCL (CARDIAC) PF 100 MG/5ML IV SOSY
PREFILLED_SYRINGE | INTRAVENOUS | Status: DC | PRN
  Administered 2020-10-03: 100 mg via INTRAVENOUS

## 2020-10-03 MED ORDER — HYDROMORPHONE HCL 1 MG/ML IJ SOLN
INTRAMUSCULAR | Status: AC
  Administered 2020-10-03: 0.5 mg via INTRAVENOUS
  Filled 2020-10-03: qty 1

## 2020-10-03 MED ORDER — OXYCODONE HCL 5 MG PO TABS
ORAL_TABLET | ORAL | Status: AC
  Administered 2020-10-03: 5 mg via ORAL
  Filled 2020-10-03: qty 1

## 2020-10-03 MED ORDER — MIDAZOLAM HCL 2 MG/2ML IJ SOLN
INTRAMUSCULAR | Status: DC | PRN
  Administered 2020-10-03: 2 mg via INTRAVENOUS

## 2020-10-03 MED ORDER — PROPOFOL 10 MG/ML IV BOLUS
INTRAVENOUS | Status: AC
  Filled 2020-10-03: qty 20

## 2020-10-03 MED ORDER — DROPERIDOL 2.5 MG/ML IJ SOLN
0.6250 mg | Freq: Once | INTRAMUSCULAR | Status: DC | PRN
  Filled 2020-10-03: qty 2

## 2020-10-03 MED ORDER — OXYCODONE HCL 5 MG/5ML PO SOLN: 5.0000 mg | Freq: Once | ORAL | Status: AC | PRN

## 2020-10-03 MED ORDER — FAMOTIDINE 20 MG PO TABS: 20.0000 mg | ORAL_TABLET | Freq: Once | ORAL | Status: AC

## 2020-10-03 MED ORDER — GLYCOPYRROLATE 0.2 MG/ML IJ SOLN
INTRAMUSCULAR | Status: DC | PRN
  Administered 2020-10-03: .8 mg via INTRAVENOUS

## 2020-10-03 MED ORDER — ONDANSETRON HCL 4 MG/2ML IJ SOLN
4.0000 mg | Freq: Four times a day (QID) | INTRAMUSCULAR | Status: DC | PRN
  Administered 2020-10-03: 4 mg via INTRAVENOUS

## 2020-10-03 MED ORDER — MIDAZOLAM HCL 2 MG/2ML IJ SOLN
INTRAMUSCULAR | Status: AC
  Filled 2020-10-03: qty 2

## 2020-10-03 MED ORDER — MEPERIDINE HCL 50 MG/ML IJ SOLN: 6.2500 mg | INTRAMUSCULAR | Status: DC | PRN

## 2020-10-03 MED ORDER — HYDROMORPHONE HCL 1 MG/ML IJ SOLN
0.2500 mg | INTRAMUSCULAR | Status: DC | PRN
  Administered 2020-10-03 (×2): 0.5 mg via INTRAVENOUS

## 2020-10-03 MED ORDER — DEXAMETHASONE SODIUM PHOSPHATE 10 MG/ML IJ SOLN
INTRAMUSCULAR | Status: AC
  Filled 2020-10-03: qty 1

## 2020-10-03 MED ORDER — FENTANYL CITRATE (PF) 100 MCG/2ML IJ SOLN
INTRAMUSCULAR | Status: DC | PRN
  Administered 2020-10-03 (×2): 50 ug via INTRAVENOUS

## 2020-10-03 MED ORDER — VANCOMYCIN HCL IN DEXTROSE 1-5 GM/200ML-% IV SOLN
INTRAVENOUS | Status: AC
  Administered 2020-10-03: 1000 mg via INTRAVENOUS
  Filled 2020-10-03: qty 200

## 2020-10-03 MED ORDER — PROMETHAZINE HCL 25 MG/ML IJ SOLN: 6.2500 mg | INTRAMUSCULAR | Status: DC | PRN

## 2020-10-03 MED ORDER — PHENYLEPHRINE HCL (PRESSORS) 10 MG/ML IV SOLN
INTRAVENOUS | Status: DC | PRN
  Administered 2020-10-03 (×3): 100 ug via INTRAVENOUS

## 2020-10-03 MED ORDER — FENTANYL CITRATE (PF) 100 MCG/2ML IJ SOLN
INTRAMUSCULAR | Status: AC
  Filled 2020-10-03: qty 2

## 2020-10-03 MED ORDER — PROMETHAZINE HCL 25 MG/ML IJ SOLN
INTRAMUSCULAR | Status: AC
  Administered 2020-10-03: 6.25 mg via INTRAVENOUS
  Filled 2020-10-03: qty 1

## 2020-10-03 MED ORDER — HYDROMORPHONE HCL 1 MG/ML IJ SOLN: 1.0000 mg | Freq: Once | INTRAMUSCULAR | Status: DC | PRN

## 2020-10-03 MED ORDER — GLYCOPYRROLATE 0.2 MG/ML IJ SOLN
INTRAMUSCULAR | Status: AC
  Filled 2020-10-03: qty 3

## 2020-10-03 MED ORDER — SODIUM CHLORIDE 0.9 % IV SOLN: INTRAVENOUS | Status: DC

## 2020-10-03 MED ORDER — VANCOMYCIN HCL IN DEXTROSE 1-5 GM/200ML-% IV SOLN: 1000.0000 mg | INTRAVENOUS | Status: AC

## 2020-10-03 MED ORDER — SODIUM CHLORIDE 0.9 % IR SOLN
Status: DC | PRN
  Administered 2020-10-03: 500 mL

## 2020-10-03 MED ORDER — ONDANSETRON HCL 4 MG/2ML IJ SOLN
INTRAMUSCULAR | Status: AC
  Filled 2020-10-03: qty 2

## 2020-10-03 MED ORDER — ROCURONIUM BROMIDE 100 MG/10ML IV SOLN
INTRAVENOUS | Status: DC | PRN
  Administered 2020-10-03: 30 mg via INTRAVENOUS

## 2020-10-03 MED ORDER — FAMOTIDINE 20 MG PO TABS
ORAL_TABLET | ORAL | Status: AC
  Administered 2020-10-03: 20 mg via ORAL
  Filled 2020-10-03: qty 1

## 2020-10-03 MED ORDER — CHLORHEXIDINE GLUCONATE 0.12 % MT SOLN: 15.0000 mL | Freq: Once | OROMUCOSAL | Status: AC

## 2020-10-03 SURGICAL SUPPLY — 37 items
ADAPTER BETA CAP QUINTON DIALY (ADAPTER) IMPLANT
ADAPTER CATH DIALYSIS 18.75 (CATHETERS) ×1 IMPLANT
ADH SKN CLS APL DERMABOND .7 (GAUZE/BANDAGES/DRESSINGS) ×1
ADPR DLYS BCP STRL PRTNL ULTEM (ADAPTER)
APL PRP STRL LF DISP 70% ISPRP (MISCELLANEOUS) ×1
CANISTER SUCT 1200ML W/VALVE (MISCELLANEOUS) ×2 IMPLANT
CATH DLYS SWAN NECK 62.5CM (CATHETERS) ×1 IMPLANT
CHLORAPREP W/TINT 26 (MISCELLANEOUS) ×2 IMPLANT
COVER WAND RF STERILE (DRAPES) ×2 IMPLANT
DERMABOND ADVANCED (GAUZE/BANDAGES/DRESSINGS) ×1
DERMABOND ADVANCED .7 DNX12 (GAUZE/BANDAGES/DRESSINGS) ×1 IMPLANT
ELECT CAUTERY BLADE 6.4 (BLADE) ×2 IMPLANT
ELECT REM PT RETURN 9FT ADLT (ELECTROSURGICAL) ×2
ELECTRODE REM PT RTRN 9FT ADLT (ELECTROSURGICAL) ×1 IMPLANT
GLOVE INDICATOR 7.5 STRL GRN (GLOVE) ×2 IMPLANT
GLOVE SURG ENC MOIS LTX SZ7 (GLOVE) ×4 IMPLANT
GOWN STRL REUS W/ TWL LRG LVL3 (GOWN DISPOSABLE) ×2 IMPLANT
GOWN STRL REUS W/ TWL XL LVL3 (GOWN DISPOSABLE) ×1 IMPLANT
GOWN STRL REUS W/TWL LRG LVL3 (GOWN DISPOSABLE) ×4
GOWN STRL REUS W/TWL XL LVL3 (GOWN DISPOSABLE) ×2
IV NS 500ML (IV SOLUTION) ×2
IV NS 500ML BAXH (IV SOLUTION) ×1 IMPLANT
KIT TURNOVER KIT A (KITS) ×2 IMPLANT
LABEL OR SOLS (LABEL) ×2 IMPLANT
MANIFOLD NEPTUNE II (INSTRUMENTS) ×2 IMPLANT
MINICAP W/POVIDONE IODINE SOL (MISCELLANEOUS) ×2 IMPLANT
PACK LAP CHOLECYSTECTOMY (MISCELLANEOUS) ×2 IMPLANT
PENCIL ELECTRO HAND CTR (MISCELLANEOUS) ×2 IMPLANT
SET CYSTO W/LG BORE CLAMP LF (SET/KITS/TRAYS/PACK) ×2 IMPLANT
SET TRANSFER 6 W/TWIST CLAMP 5 (SET/KITS/TRAYS/PACK) ×1 IMPLANT
SET TUBE SMOKE EVAC HIGH FLOW (TUBING) ×2 IMPLANT
SPONGE DRAIN TRACH 4X4 STRL 2S (GAUZE/BANDAGES/DRESSINGS) ×2 IMPLANT
SUT MNCRL AB 4-0 PS2 18 (SUTURE) ×2 IMPLANT
SUT VIC AB 2-0 UR6 27 (SUTURE) ×2 IMPLANT
SUT VICRYL+ 3-0 36IN CT-1 (SUTURE) ×2 IMPLANT
TROCAR XCEL NON-BLD 11X100MML (ENDOMECHANICALS) ×2 IMPLANT
TROCAR XCEL NON-BLD 5MMX100MML (ENDOMECHANICALS) ×1 IMPLANT

## 2020-10-03 NOTE — Discharge Instructions (Signed)

## 2020-10-03 NOTE — Transfer of Care (Signed)
Immediate Anesthesia Transfer of Care Note  Patient: Larry [name]  Procedure(s) Performed: CONTINUOUS AMBULATORY PERITONEAL DIALYSIS (CAPD) CATHETER REVISION (N/A )  Patient Location: PACU  Anesthesia Type:General  Level of Consciousness: awake and alert   Airway & Oxygen Therapy: Patient Spontanous Breathing and Patient connected to face mask oxygen  Post-op Assessment: Report given to RN and Post -op Vital signs reviewed and stable  Post vital signs: Reviewed and stable  Last Vitals:  Vitals Value Taken Time  BP 143/82 10/03/20 1502  Temp    Pulse 88 10/03/20 1504  Resp 17 10/03/20 1504  SpO2 100 % 10/03/20 1504  Vitals shown include unvalidated device data.  Last Pain:  Vitals:   10/03/20 1321  TempSrc: Oral  PainSc: 0-No pain         Complications: No complications documented.

## 2020-10-03 NOTE — Interval H&P Note (Signed)
History and Physical Interval Note:  10/03/2020 1:52 PM  Larry [name]  has presented today for surgery, with the diagnosis of ESRD.  The various methods of treatment have been discussed with the patient and family. After consideration of risks, benefits and other options for treatment, the patient has consented to  Procedure(s): CONTINUOUS AMBULATORY PERITONEAL DIALYSIS (CAPD) CATHETER REVISION (N/A) as a surgical intervention.  The patient's history has been reviewed, patient examined, no change in status, stable for surgery.  I have reviewed the patient's chart and labs.  Questions were answered to the patient's satisfaction.     Leotis Pain

## 2020-10-03 NOTE — Anesthesia Preprocedure Evaluation (Addendum)
Anesthesia Evaluation  Patient identified by MRN, date of birth, ID band Patient awake    Reviewed: Allergy & Precautions, H&P , NPO status , Patient's Chart, lab work & pertinent test results  Airway Mallampati: III       Dental  (+) Poor Dentition   Pulmonary neg pulmonary ROS, former smoker,    Pulmonary exam normal breath sounds clear to auscultation       Cardiovascular hypertension, negative cardio ROS Normal cardiovascular exam Rhythm:Regular Rate:Normal     Neuro/Psych negative neurological ROS  negative psych ROS   GI/Hepatic negative GI ROS, Neg liver ROS,   Endo/Other  negative endocrine ROS  Renal/GU Renal disease  negative genitourinary   Musculoskeletal negative musculoskeletal ROS (+)   Abdominal   Peds negative pediatric ROS (+)  Hematology negative hematology ROS (+)   Anesthesia Other Findings Past Medical History: No date: Anemia No date: Chronic kidney disease No date: Hypertension No date: Nephrotic syndrome   Reproductive/Obstetrics negative OB ROS                            Anesthesia Physical Anesthesia Plan  ASA: III  Anesthesia Plan: General   Post-op Pain Management:    Induction: Intravenous  PONV Risk Score and Plan: 2 and Ondansetron and Dexamethasone  Airway Management Planned: Oral ETT  Additional Equipment:   Intra-op Plan:   Post-operative Plan: Extubation in OR  Informed Consent: I have reviewed the patients History and Physical, chart, labs and discussed the procedure including the risks, benefits and alternatives for the proposed anesthesia with the patient or authorized representative who has indicated his/her understanding and acceptance.       Plan Discussed with: CRNA, Anesthesiologist and Surgeon  Anesthesia Plan Comments:         Anesthesia Quick Evaluation

## 2020-10-03 NOTE — Anesthesia Procedure Notes (Signed)
Procedure Name: Intubation Performed by: Demetrius Charity, CRNA Pre-anesthesia Checklist: Patient identified, Patient being monitored, Timeout performed, Emergency Drugs available and Suction available Patient Re-evaluated:Patient Re-evaluated prior to induction Oxygen Delivery Method: Circle system utilized Preoxygenation: Pre-oxygenation with 100% oxygen Induction Type: IV induction Ventilation: Mask ventilation without difficulty Laryngoscope Size: 3 and McGraph Grade View: Grade I Tube type: Oral Tube size: 7.0 mm Number of attempts: 1 Airway Equipment and Method: Stylet and Video-laryngoscopy Placement Confirmation: ETT inserted through vocal cords under direct vision,  positive ETCO2 and breath sounds checked- equal and bilateral Secured at: 22 cm Tube secured with: Tape Dental Injury: Teeth and Oropharynx as per pre-operative assessment

## 2020-10-03 NOTE — Op Note (Signed)
  OPERATIVE NOTE   PROCEDURE: 1. Laparoscopic peritoneal dialysis catheter revision.  PRE-OPERATIVE DIAGNOSIS: 1. end-stage renal disease 2.  Nonfunctional peritoneal dialysis catheter  POST-OPERATIVE DIAGNOSIS: Same  SURGEON: Leotis Pain, MD  ASSISTANT(S): Hezzie Bump, PA-C  ANESTHESIA: general  ESTIMATED BLOOD LOSS: Minimal   FINDING(S): 1. The catheter in the pelvis had fibrinous material within the catheter and was twisted in the pelvis  SPECIMEN(S): None  INDICATIONS:  Patient presents with end-stage renal disease and a nonfunctional peritoneal dialysis catheter requiring revision. The patient has decided to do peritoneal dialysis for his long-term dialysis. Risks and benefits of placement were discussed and he is agreeable to proceed.  Differences between peritoneal dialysis and hemodialysis were discussed. An assistant was present during the procedure to help facilitate the exposure and expedite the procedure.  DESCRIPTION: After obtaining full informed written consent, the patient was brought back to the operating room and placed supine upon the operating table. The patient received IV antibiotics prior to induction. After obtaining adequate anesthesia, the abdomen was prepped and draped in the standard fashion. The assistant provided retraction and mobilization to help facilitate exposure and expedite the procedure throughout the entire procedure.  This included following suture, using retractors, and optimizing lighting. I then entered the peritoneum with an 109mm Optiview trocar placed in the right upper quadrant and insufflated the abdomen with carbon dioxide.  I then placed a 5 mm trocar in the left abdomen under direct laparoscopic guidance.  The catheter was visualized and appeared to be deep in the pelvis.  A 5 mm trocar was then used to gently retract the catheter from the pelvis where on removal it was clearly twisted in the pelvis.  There was a medium amount of  fibrinous material within the catheter that was then flushed under direct visualization with 20 cc of saline and began withdrawing fluid easily.  There was a significant amount of fluid in the pelvis. The appropriate distal connectors were placed, and I then placed 500 cc of saline through the catheter into the pelvis. The abdomen was desufflated. Immediately, all 500 cc of effluent returned through the catheter when the bag was placed to gravity. I took one more look with the camera to ensure that the catheter was in the pelvis and it was.  As the patient had previously put fluid in that had not completely do well, I elected to go ahead and drain more fluid out of the peritoneal cavity.  Another 400 to 500 cc of fluid was removed from the peritoneal cavity before the catheter was capped off.  The 59mm trocar was then removed. I then closed the incisions with 3-0 Vicryl and 4-0 Monocryl and placed Dermabond as dressing. Dry dressing was placed around the catheter exit site. The patient was then awakened from anesthesia and taken to the recovery room in stable condition having tolerated the procedure well.  COMPLICATIONS: None  CONDITION: None  Leotis Pain, MD 10/03/2020 2:58 PM   This note was created with Dragon Medical transcription system. Any errors in dictation are purely unintentional.

## 2020-10-04 NOTE — Anesthesia Postprocedure Evaluation (Signed)
Anesthesia Post Note  Patient: Larry [name]  Procedure(s) Performed: CONTINUOUS AMBULATORY PERITONEAL DIALYSIS (CAPD) CATHETER REVISION (N/A )  Patient location during evaluation: PACU Anesthesia Type: General Level of consciousness: awake Pain management: pain level controlled Vital Signs Assessment: post-procedure vital signs reviewed and stable Respiratory status: spontaneous breathing Cardiovascular status: stable and blood pressure returned to baseline Postop Assessment: no apparent nausea or vomiting Anesthetic complications: no   No complications documented.   Last Vitals:  Vitals:   10/03/20 1655 10/03/20 1700  BP: 103/65   Pulse: 73 71  Resp: (!) 21 14  Temp:    SpO2: 94% 96%    Last Pain:  Vitals:   10/03/20 1655  TempSrc:   PainSc: 1                  Neva Seat

## 2020-10-22 DIAGNOSIS — Z992 Dependence on renal dialysis: Secondary | ICD-10-CM | POA: Diagnosis not present

## 2020-10-22 DIAGNOSIS — N186 End stage renal disease: Secondary | ICD-10-CM | POA: Diagnosis not present

## 2020-10-24 ENCOUNTER — Ambulatory Visit (INDEPENDENT_AMBULATORY_CARE_PROVIDER_SITE_OTHER): Payer: BLUE CROSS/BLUE SHIELD | Admitting: Nurse Practitioner

## 2020-10-29 ENCOUNTER — Other Ambulatory Visit: Payer: Self-pay

## 2020-10-29 ENCOUNTER — Ambulatory Visit (INDEPENDENT_AMBULATORY_CARE_PROVIDER_SITE_OTHER): Payer: Medicare Other | Admitting: Nurse Practitioner

## 2020-10-29 VITALS — BP 212/115 | HR 77 | Ht 66.0 in | Wt 149.0 lb

## 2020-10-29 DIAGNOSIS — N186 End stage renal disease: Secondary | ICD-10-CM | POA: Insufficient documentation

## 2020-11-04 ENCOUNTER — Encounter (INDEPENDENT_AMBULATORY_CARE_PROVIDER_SITE_OTHER): Payer: Self-pay | Admitting: Nurse Practitioner

## 2020-11-04 NOTE — Progress Notes (Signed)
Subjective:    Patient ID: Larry [name], male    DOB: [birth year], 55 y.o.   MRN: [id number] Chief Complaint  Patient presents with  . Follow-up    3 wk post capd, wound recheck     Patient presents today for wound evaluation after peritoneal dialysis catheter exchange on 10/03/2020.  The patient was having difficulties with his previous placed peritoneal dialysis catheter.  Despite a TPA infusion the catheter was nonfunctional.  Ultimately it was replaced.  Since that replacement the patient notes that it has been functioning very well in fact it runs better than it previously has.  The patient's wounds have all scabbed over and there is no significant signs symptoms of infection.  Overall patient is doing very well.   Review of Systems  Gastrointestinal: Negative for abdominal distention, abdominal pain and constipation.  Skin: Positive for wound.  All other systems reviewed and are negative.      Objective:   Physical Exam Vitals reviewed.  HENT:     Head: Normocephalic.  Cardiovascular:     Rate and Rhythm: Normal rate.  Neurological:     Mental Status: He is alert and oriented to person, place, and time.  Psychiatric:        Mood and Affect: Mood normal.        Behavior: Behavior normal.        Thought Content: Thought content normal.        Judgment: Judgment normal.     BP (!) 212/115   Pulse 77   Ht '5\' 6"'$  (1.676 m)   Wt 149 lb (67.6 kg)   BMI 24.05 kg/m   Past Medical History:  Diagnosis Date  . Anemia   . Chronic kidney disease   . Hypertension   . Nephrotic syndrome     Social History   Socioeconomic History  . Marital status: Single    Spouse name: Not on file  . Number of children: Not on file  . Years of education: Not on file  . Highest education level: Not on file  Occupational History  . Not on file  Tobacco Use  . Smoking status: Former Smoker    Years: 20.00    Types: Cigarettes    Quit date: 08/22/2017    Years since quitting: 3.2   . Smokeless tobacco: Never Used  Vaping Use  . Vaping Use: Never used  Substance and Sexual Activity  . Alcohol use: Yes    Comment: occ. alcohol use  . Drug use: No  . Sexual activity: Not on file  Other Topics Concern  . Not on file  Social History Narrative  . Not on file   Social Determinants of Health   Financial Resource Strain: Not on file  Food Insecurity: Not on file  Transportation Needs: Not on file  Physical Activity: Not on file  Stress: Not on file  Social Connections: Not on file  Intimate Partner Violence: Not on file    Past Surgical History:  Procedure Laterality Date  . CAPD INSERTION N/A 01/06/2018   Procedure: LAPAROSCOPIC INSERTION CONTINUOUS AMBULATORY PERITONEAL DIALYSIS  (CAPD) CATHETER;  Surgeon: Katha Cabal, MD;  Location: [facility] ORS;  Service: Vascular;  Laterality: N/A;  . CHOLECYSTECTOMY    . CYSTOSCOPY W/ URETERAL STENT REMOVAL Right 10/23/2017   Procedure: CYSTOSCOPY WITH STENT REPLACEMENT;  Surgeon: [name], MD;  Location: [facility];  Service: Urology;  Laterality: Right;  . CYSTOSCOPY WITH STENT PLACEMENT Right 09/30/2017  Procedure: CYSTOSCOPY WITH STENT PLACEMENT;  Surgeon: [name], MD;  Location: [facility];  Service: Urology;  Laterality: Right;  . CYSTOSCOPY/RETROGRADE/URETEROSCOPY Right 10/23/2017   Procedure: CYSTOSCOPY/RETROGRADE/URETEROSCOPY;  Surgeon: [name], MD;  Location: [facility] OR[facility];  Service: Urology;  Laterality: Right;  . RENAL BIOPSY      Family History  Problem Relation Age of Onset  . Diabetes Mother   . Kidney disease Mother        mother on dialysis  . CAD Mother   . Diabetes Sister   . CAD Sister     Allergies  Allergen Reactions  . Mushroom Extract Complex     Headache, migraines  . Amoxicillin Nausea And Vomiting    Weakness  Has patient had a PCN reaction causing immediate rash, facial/tongue/throat swelling, SOB or lightheadedness with hypotension: No Has patient had a PCN  reaction causing severe rash involving mucus membranes or skin necrosis: No Has patient had a PCN reaction that required hospitalization: No Has patient had a PCN reaction occurring within the last 10 years: No If all of the above answers are "NO", then may proceed with Cephalosporin use.     CBC Latest Ref Rng & Units 10/03/2020 10/01/2020 12/14/2018  WBC 4.0 - 10.5 K/uL - 10.1 7.4  Hemoglobin 13.0 - 17.0 g/dL 9.2(L) 8.9(L) 13.8  Hematocrit 39.0 - 52.0 % 27.0(L) 27.3(L) 41.4  Platelets 150 - 400 K/uL - 274 243      CMP     Component Value Date/Time   NA 138 10/03/2020 1339   NA 138 01/19/2015 0545   K 3.9 10/03/2020 1339   K 3.9 01/19/2015 0545   CL 107 10/03/2020 1339   CL 114 (H) 01/19/2015 0545   CO2 14 (L) 10/01/2020 1128   CO2 18 (L) 01/19/2015 0545   GLUCOSE 87 10/03/2020 1339   GLUCOSE 95 01/19/2015 0545   BUN >130 (H) 10/03/2020 1339   BUN 48 (H) 01/19/2015 0545   CREATININE >18.00 (H) 10/03/2020 1339   CREATININE 5.66 (H) 01/19/2015 0545   CALCIUM 7.6 (L) 10/01/2020 1128   CALCIUM 8.0 (L) 01/19/2015 0545   PROT 6.1 (L) 12/14/2018 1430   PROT 6.0 (L) 01/01/2015 0748   ALBUMIN 3.5 12/14/2018 1430   ALBUMIN 2.6 (L) 01/01/2015 0748   AST 50 (H) 12/14/2018 1430   AST 23 01/01/2015 0748   ALT 36 12/14/2018 1430   ALT 18 01/01/2015 0748   ALKPHOS 59 12/14/2018 1430   ALKPHOS 65 01/01/2015 0748   BILITOT 1.0 12/14/2018 1430   BILITOT <0.1 (L) 01/01/2015 0748   GFRNONAA 3 (L) 10/01/2020 1128   GFRNONAA 11 (L) 01/19/2015 0545   GFRAA 21 (L) 12/14/2018 1430   GFRAA 13 (L) 01/19/2015 0545     No results found.     Assessment & Plan:   1. End stage renal disease [facility]) Patient notes that the new peritoneal dialysis catheter is working very well.  The wounds have healed very nicely as well.  Patient will continue to follow with dialysis center in regards to peritoneal dialysis.  We will follow up with [place] on an as-needed basis.   Current Outpatient Medications on  File Prior to Visit  Medication Sig Dispense Refill  . hydrALAZINE (APRESOLINE) 50 MG tablet Take 50 mg by mouth daily as needed (If diastolic bp is 90 or above).     Marland Kitchen HYDROcodone-acetaminophen (NORCO) 5-325 MG tablet Take 1-2 tablets by mouth every 6 (six) hours as needed. 35 tablet 0  .  ibuprofen (ADVIL,MOTRIN) 200 MG tablet Take 400 mg by mouth daily as needed for headache or moderate pain.    Marland Kitchen losartan (COZAAR) 50 MG tablet Take by mouth.    . metoprolol succinate (TOPROL-XL) 100 MG 24 hr tablet Take 100 mg by mouth daily.    . nicotine (NICODERM CQ - DOSED IN MG/24 HOURS) 14 mg/24hr patch Place 14 mg onto the skin daily.    Marland Kitchen oxybutynin (DITROPAN) 5 MG tablet Take by mouth.    . sertraline (ZOLOFT) 25 MG tablet Take 25 mg by mouth daily.   3  . sodium bicarbonate 650 MG tablet Take 1,300 mg by mouth 2 (two) times daily.     Marland Kitchen acetaminophen (TYLENOL) 500 MG tablet Take by mouth.    . famotidine (PEPCID) 20 MG tablet Take 1 tablet (20 mg total) by mouth 2 (two) times daily. (Patient taking differently: Take 20 mg by mouth daily as needed for heartburn. ) 60 tablet 0   Current Facility-Administered Medications on File Prior to Visit  Medication Dose Route Frequency Provider Last Rate Last Admin  . 0.9 %  sodium chloride infusion   Intravenous Continuous [name], MD 10 mL/hr at 06/14/15 1412 New Bag at 06/14/15 1412    There are no Patient Instructions on file for this visit. No follow-ups on file.   Kris Hartmann, NP

## 2020-12-05 DIAGNOSIS — I10 Essential (primary) hypertension: Secondary | ICD-10-CM | POA: Diagnosis not present

## 2020-12-05 DIAGNOSIS — R0789 Other chest pain: Secondary | ICD-10-CM | POA: Diagnosis not present

## 2020-12-05 DIAGNOSIS — R079 Chest pain, unspecified: Secondary | ICD-10-CM | POA: Diagnosis not present

## 2020-12-06 ENCOUNTER — Inpatient Hospital Stay
Admission: EM | Admit: 2020-12-06 | Discharge: 2020-12-09 | DRG: 682 | Disposition: A | Discharge status: Home / Self Care | Payer: Medicare Other | Attending: Internal Medicine | Admitting: Internal Medicine

## 2020-12-06 ENCOUNTER — Other Ambulatory Visit: Payer: Self-pay

## 2020-12-06 ENCOUNTER — Emergency Department: Payer: Medicare Other

## 2020-12-06 ENCOUNTER — Observation Stay (HOSPITAL_COMMUNITY)
Admit: 2020-12-06 | Discharge: 2020-12-06 | Disposition: A | Discharge status: Home / Self Care | Payer: Medicare Other | Attending: Internal Medicine | Admitting: Internal Medicine

## 2020-12-06 DIAGNOSIS — I10 Essential (primary) hypertension: Secondary | ICD-10-CM | POA: Diagnosis not present

## 2020-12-06 DIAGNOSIS — F17210 Nicotine dependence, cigarettes, uncomplicated: Secondary | ICD-10-CM | POA: Diagnosis present

## 2020-12-06 DIAGNOSIS — J069 Acute upper respiratory infection, unspecified: Secondary | ICD-10-CM | POA: Diagnosis not present

## 2020-12-06 DIAGNOSIS — I2510 Atherosclerotic heart disease of native coronary artery without angina pectoris: Secondary | ICD-10-CM | POA: Diagnosis present

## 2020-12-06 DIAGNOSIS — R079 Chest pain, unspecified: Secondary | ICD-10-CM

## 2020-12-06 DIAGNOSIS — T82898A Other specified complication of vascular prosthetic devices, implants and grafts, initial encounter: Secondary | ICD-10-CM | POA: Diagnosis not present

## 2020-12-06 DIAGNOSIS — I120 Hypertensive chronic kidney disease with stage 5 chronic kidney disease or end stage renal disease: Principal | ICD-10-CM | POA: Diagnosis present

## 2020-12-06 DIAGNOSIS — Z79899 Other long term (current) drug therapy: Secondary | ICD-10-CM | POA: Diagnosis not present

## 2020-12-06 DIAGNOSIS — N186 End stage renal disease: Secondary | ICD-10-CM | POA: Diagnosis not present

## 2020-12-06 DIAGNOSIS — Z841 Family history of disorders of kidney and ureter: Secondary | ICD-10-CM

## 2020-12-06 DIAGNOSIS — I959 Hypotension, unspecified: Secondary | ICD-10-CM | POA: Diagnosis present

## 2020-12-06 DIAGNOSIS — Z992 Dependence on renal dialysis: Secondary | ICD-10-CM | POA: Diagnosis not present

## 2020-12-06 DIAGNOSIS — R1110 Vomiting, unspecified: Secondary | ICD-10-CM | POA: Diagnosis not present

## 2020-12-06 DIAGNOSIS — Z8249 Family history of ischemic heart disease and other diseases of the circulatory system: Secondary | ICD-10-CM

## 2020-12-06 DIAGNOSIS — N19 Unspecified kidney failure: Secondary | ICD-10-CM | POA: Diagnosis not present

## 2020-12-06 DIAGNOSIS — Z91018 Allergy to other foods: Secondary | ICD-10-CM

## 2020-12-06 DIAGNOSIS — R778 Other specified abnormalities of plasma proteins: Secondary | ICD-10-CM | POA: Diagnosis not present

## 2020-12-06 DIAGNOSIS — F17200 Nicotine dependence, unspecified, uncomplicated: Secondary | ICD-10-CM

## 2020-12-06 DIAGNOSIS — I319 Disease of pericardium, unspecified: Secondary | ICD-10-CM

## 2020-12-06 DIAGNOSIS — K209 Esophagitis, unspecified without bleeding: Secondary | ICD-10-CM | POA: Diagnosis present

## 2020-12-06 DIAGNOSIS — D631 Anemia in chronic kidney disease: Secondary | ICD-10-CM | POA: Diagnosis not present

## 2020-12-06 DIAGNOSIS — N185 Chronic kidney disease, stage 5: Secondary | ICD-10-CM | POA: Diagnosis not present

## 2020-12-06 DIAGNOSIS — J439 Emphysema, unspecified: Secondary | ICD-10-CM | POA: Diagnosis present

## 2020-12-06 DIAGNOSIS — R7989 Other specified abnormal findings of blood chemistry: Secondary | ICD-10-CM | POA: Diagnosis not present

## 2020-12-06 DIAGNOSIS — I480 Paroxysmal atrial fibrillation: Secondary | ICD-10-CM | POA: Diagnosis not present

## 2020-12-06 DIAGNOSIS — E785 Hyperlipidemia, unspecified: Secondary | ICD-10-CM | POA: Diagnosis present

## 2020-12-06 DIAGNOSIS — I517 Cardiomegaly: Secondary | ICD-10-CM | POA: Diagnosis not present

## 2020-12-06 DIAGNOSIS — R109 Unspecified abdominal pain: Secondary | ICD-10-CM | POA: Diagnosis not present

## 2020-12-06 DIAGNOSIS — Z880 Allergy status to penicillin: Secondary | ICD-10-CM | POA: Diagnosis not present

## 2020-12-06 DIAGNOSIS — Z20822 Contact with and (suspected) exposure to covid-19: Secondary | ICD-10-CM | POA: Diagnosis not present

## 2020-12-06 DIAGNOSIS — R69 Illness, unspecified: Secondary | ICD-10-CM | POA: Diagnosis not present

## 2020-12-06 DIAGNOSIS — R112 Nausea with vomiting, unspecified: Secondary | ICD-10-CM | POA: Diagnosis present

## 2020-12-06 DIAGNOSIS — I309 Acute pericarditis, unspecified: Secondary | ICD-10-CM | POA: Diagnosis not present

## 2020-12-06 DIAGNOSIS — N2581 Secondary hyperparathyroidism of renal origin: Secondary | ICD-10-CM | POA: Diagnosis present

## 2020-12-06 DIAGNOSIS — R072 Precordial pain: Secondary | ICD-10-CM | POA: Diagnosis not present

## 2020-12-06 DIAGNOSIS — R0789 Other chest pain: Secondary | ICD-10-CM | POA: Diagnosis not present

## 2020-12-06 DIAGNOSIS — F17219 Nicotine dependence, cigarettes, with unspecified nicotine-induced disorders: Secondary | ICD-10-CM | POA: Diagnosis not present

## 2020-12-06 HISTORY — DX: End stage renal disease: N18.6

## 2020-12-06 LAB — ECHOCARDIOGRAM COMPLETE
AR max vel: 3.1 cm2
AV Area VTI: 3.3 cm2
AV Area mean vel: 2.97 cm2
AV Mean grad: 6 mmHg
AV Peak grad: 11.7 mmHg
Ao pk vel: 1.71 m/s
Area-P 1/2: 3.4 cm2
Height: 66 in
S' Lateral: 2.43 cm
Weight: 2400 oz

## 2020-12-06 LAB — HEPATIC FUNCTION PANEL
ALT: 11 U/L (ref 0–44)
AST: 13 U/L — ABNORMAL LOW (ref 15–41)
Albumin: 3.2 g/dL — ABNORMAL LOW (ref 3.5–5.0)
Alkaline Phosphatase: 59 U/L (ref 38–126)
Bilirubin, Direct: <0.1 mg/dL (ref 0.0–0.2)
Total Bilirubin: 0.8 mg/dL (ref 0.3–1.2)
Total Protein: 6 g/dL — ABNORMAL LOW (ref 6.5–8.1)

## 2020-12-06 LAB — CBC WITH DIFFERENTIAL/PLATELET
Abs Immature Granulocytes: 0.17 10*3/uL — ABNORMAL HIGH (ref 0.00–0.07)
Basophils Absolute: 0.1 10*3/uL (ref 0.0–0.1)
Basophils Relative: 0 %
Eosinophils Absolute: 0.4 10*3/uL (ref 0.0–0.5)
Eosinophils Relative: 3 %
HCT: 28.7 % — ABNORMAL LOW (ref 39.0–52.0)
Hemoglobin: 9.5 g/dL — ABNORMAL LOW (ref 13.0–17.0)
Immature Granulocytes: 1 %
Lymphocytes Relative: 11 %
Lymphs Abs: 1.6 10*3/uL (ref 0.7–4.0)
MCH: 30.3 pg (ref 26.0–34.0)
MCHC: 33.1 g/dL (ref 30.0–36.0)
MCV: 91.4 fL (ref 80.0–100.0)
Monocytes Absolute: 1.1 10*3/uL — ABNORMAL HIGH (ref 0.1–1.0)
Monocytes Relative: 8 %
Neutro Abs: 10.7 10*3/uL — ABNORMAL HIGH (ref 1.7–7.7)
Neutrophils Relative %: 77 %
Platelets: 242 10*3/uL (ref 150–400)
RBC: 3.14 MIL/uL — ABNORMAL LOW (ref 4.22–5.81)
RDW: 16 % — ABNORMAL HIGH (ref 11.5–15.5)
WBC: 14.1 10*3/uL — ABNORMAL HIGH (ref 4.0–10.5)
nRBC: 0 % (ref 0.0–0.2)

## 2020-12-06 LAB — TROPONIN I (HIGH SENSITIVITY)
Troponin I (High Sensitivity): 129 ng/L (ref <18)
Troponin I (High Sensitivity): 109 ng/L (ref <18)

## 2020-12-06 LAB — CREATININE, SERUM
Creatinine, Ser: 22.99 mg/dL — ABNORMAL HIGH (ref 0.61–1.24)
GFR, Estimated: 2 mL/min — ABNORMAL LOW (ref >60)

## 2020-12-06 LAB — CBC
HCT: 28.3 % — ABNORMAL LOW (ref 39.0–52.0)
Hemoglobin: 9.1 g/dL — ABNORMAL LOW (ref 13.0–17.0)
MCH: 30 pg (ref 26.0–34.0)
MCHC: 32.2 g/dL (ref 30.0–36.0)
MCV: 93.4 fL (ref 80.0–100.0)
Platelets: 239 10*3/uL (ref 150–400)
RBC: 3.03 MIL/uL — ABNORMAL LOW (ref 4.22–5.81)
RDW: 16 % — ABNORMAL HIGH (ref 11.5–15.5)
WBC: 16 10*3/uL — ABNORMAL HIGH (ref 4.0–10.5)
nRBC: 0 % (ref 0.0–0.2)

## 2020-12-06 LAB — LIPID PANEL
Cholesterol: 140 mg/dL (ref 0–200)
HDL: 27 mg/dL — ABNORMAL LOW (ref >40)
LDL Cholesterol: 82 mg/dL (ref 0–99)
Total CHOL/HDL Ratio: 5.2 RATIO
Triglycerides: 154 mg/dL — ABNORMAL HIGH (ref <150)
VLDL: 31 mg/dL (ref 0–40)

## 2020-12-06 LAB — BASIC METABOLIC PANEL
Anion gap: 20 — ABNORMAL HIGH (ref 5–15)
BUN: 139 mg/dL — ABNORMAL HIGH (ref 6–20)
CO2: 9 mmol/L — ABNORMAL LOW (ref 22–32)
Calcium: 7 mg/dL — ABNORMAL LOW (ref 8.9–10.3)
Chloride: 105 mmol/L (ref 98–111)
Creatinine, Ser: 23.62 mg/dL — ABNORMAL HIGH (ref 0.61–1.24)
GFR, Estimated: 2 mL/min — ABNORMAL LOW (ref >60)
Glucose, Bld: 128 mg/dL — ABNORMAL HIGH (ref 70–99)
Potassium: 4.2 mmol/L (ref 3.5–5.1)
Sodium: 134 mmol/L — ABNORMAL LOW (ref 135–145)

## 2020-12-06 LAB — HIV ANTIBODY (ROUTINE TESTING W REFLEX): HIV Screen 4th Generation wRfx: NONREACTIVE

## 2020-12-06 LAB — LIPASE, BLOOD: Lipase: 31 U/L (ref 11–51)

## 2020-12-06 LAB — RESP PANEL BY RT-PCR (FLU A&B, COVID) ARPGX2
Influenza A by PCR: NEGATIVE
Influenza B by PCR: NEGATIVE
SARS Coronavirus 2 by RT PCR: NEGATIVE

## 2020-12-06 LAB — HEMOGLOBIN A1C
Hgb A1c MFr Bld: 6 % — ABNORMAL HIGH (ref 4.8–5.6)
Mean Plasma Glucose: 125.5 mg/dL

## 2020-12-06 MED ORDER — HYDROMORPHONE HCL 1 MG/ML IJ SOLN
1.0000 mg | Freq: Once | INTRAMUSCULAR | Status: AC
  Administered 2020-12-06: 1 mg via INTRAVENOUS
  Filled 2020-12-06: qty 1

## 2020-12-06 MED ORDER — NITROGLYCERIN 0.4 MG SL SUBL
0.4000 mg | SUBLINGUAL_TABLET | SUBLINGUAL | Status: DC | PRN
Start: 2020-12-06 — End: 2020-12-09
  Administered 2020-12-06 – 2020-12-07 (×2): 0.4 mg via SUBLINGUAL
  Filled 2020-12-06 (×2): qty 1

## 2020-12-06 MED ORDER — TRAMADOL HCL 50 MG PO TABS
50.0000 mg | ORAL_TABLET | Freq: Once | ORAL | Status: AC
Start: 2020-12-06 — End: 2020-12-06
  Administered 2020-12-06: 50 mg via ORAL
  Filled 2020-12-06: qty 1

## 2020-12-06 MED ORDER — ASPIRIN EC 81 MG PO TBEC: 81.0000 mg | DELAYED_RELEASE_TABLET | Freq: Every day | ORAL | Status: DC

## 2020-12-06 MED ORDER — MORPHINE SULFATE (PF) 2 MG/ML IV SOLN: 2.0000 mg | INTRAVENOUS | Status: DC | PRN

## 2020-12-06 MED ORDER — ONDANSETRON HCL 4 MG/2ML IJ SOLN
4.0000 mg | Freq: Once | INTRAMUSCULAR | Status: AC
  Administered 2020-12-06: 4 mg via INTRAVENOUS
  Filled 2020-12-06: qty 2

## 2020-12-06 MED ORDER — MENTHOL 3 MG MT LOZG
1.0000 | LOZENGE | OROMUCOSAL | Status: DC | PRN
  Filled 2020-12-06: qty 9

## 2020-12-06 MED ORDER — LOSARTAN POTASSIUM 50 MG PO TABS
50.0000 mg | ORAL_TABLET | Freq: Every day | ORAL | Status: DC
  Administered 2020-12-06 – 2020-12-09 (×3): 50 mg via ORAL
  Filled 2020-12-06 (×3): qty 1

## 2020-12-06 MED ORDER — PANTOPRAZOLE SODIUM 40 MG IV SOLR
40.0000 mg | Freq: Two times a day (BID) | INTRAVENOUS | Status: DC
  Administered 2020-12-06 – 2020-12-09 (×7): 40 mg via INTRAVENOUS
  Filled 2020-12-06 (×7): qty 40

## 2020-12-06 MED ORDER — ACETAMINOPHEN 500 MG PO TABS
1000.0000 mg | ORAL_TABLET | Freq: Once | ORAL | Status: AC
  Administered 2020-12-06: 1000 mg via ORAL

## 2020-12-06 MED ORDER — TRAMADOL HCL 50 MG PO TABS
50.0000 mg | ORAL_TABLET | Freq: Two times a day (BID) | ORAL | Status: DC | PRN
  Administered 2020-12-06: 50 mg via ORAL
  Filled 2020-12-06: qty 1

## 2020-12-06 MED ORDER — ASPIRIN 81 MG PO CHEW
324.0000 mg | CHEWABLE_TABLET | Freq: Once | ORAL | Status: AC
  Administered 2020-12-06: 324 mg via ORAL
  Filled 2020-12-06: qty 4

## 2020-12-06 MED ORDER — FAMOTIDINE 20 MG PO TABS: 20.0000 mg | ORAL_TABLET | Freq: Two times a day (BID) | ORAL | Status: DC

## 2020-12-06 MED ORDER — NITROGLYCERIN 0.4 MG SL SUBL: 0.4000 mg | SUBLINGUAL_TABLET | SUBLINGUAL | Status: DC | PRN

## 2020-12-06 MED ORDER — ALUM & MAG HYDROXIDE-SIMETH 200-200-20 MG/5ML PO SUSP: 30.0000 mL | Freq: Once | ORAL | Status: DC

## 2020-12-06 MED ORDER — SERTRALINE HCL 50 MG PO TABS
25.0000 mg | ORAL_TABLET | Freq: Every day | ORAL | Status: DC
  Administered 2020-12-06 – 2020-12-09 (×4): 25 mg via ORAL
  Filled 2020-12-06 (×4): qty 1

## 2020-12-06 MED ORDER — ONDANSETRON HCL 4 MG/2ML IJ SOLN
4.0000 mg | Freq: Four times a day (QID) | INTRAMUSCULAR | Status: DC | PRN
  Administered 2020-12-06 – 2020-12-07 (×3): 4 mg via INTRAVENOUS
  Filled 2020-12-06 (×4): qty 2

## 2020-12-06 MED ORDER — ACETAMINOPHEN 325 MG PO TABS
650.0000 mg | ORAL_TABLET | ORAL | Status: DC | PRN
  Filled 2020-12-06: qty 2

## 2020-12-06 MED ORDER — FAMOTIDINE IN NACL 20-0.9 MG/50ML-% IV SOLN
20.0000 mg | Freq: Two times a day (BID) | INTRAVENOUS | Status: DC
  Administered 2020-12-06 – 2020-12-09 (×7): 20 mg via INTRAVENOUS
  Filled 2020-12-06 (×8): qty 50

## 2020-12-06 MED ORDER — IOHEXOL 350 MG/ML SOLN
75.0000 mL | Freq: Once | INTRAVENOUS | Status: AC | PRN
  Administered 2020-12-06: 75 mL via INTRAVENOUS

## 2020-12-06 MED ORDER — ALUM HYDROXIDE-MAG CARBONATE 95-358 MG/15ML PO SUSP: 30.0000 mL | ORAL | Status: DC | PRN

## 2020-12-06 MED ORDER — LIDOCAINE VISCOUS HCL 2 % MT SOLN
15.0000 mL | Freq: Once | OROMUCOSAL | Status: AC
  Administered 2020-12-06: 15 mL via ORAL
  Filled 2020-12-06: qty 15

## 2020-12-06 MED ORDER — ONDANSETRON 4 MG PO TBDP
4.0000 mg | ORAL_TABLET | Freq: Once | ORAL | Status: AC
  Administered 2020-12-06: 4 mg via ORAL
  Filled 2020-12-06: qty 1

## 2020-12-06 MED ORDER — LIDOCAINE VISCOUS HCL 2 % MT SOLN: 15.0000 mL | Freq: Once | OROMUCOSAL | Status: DC

## 2020-12-06 MED ORDER — NICOTINE 21 MG/24HR TD PT24
21.0000 mg | MEDICATED_PATCH | Freq: Every day | TRANSDERMAL | Status: DC
  Administered 2020-12-06 – 2020-12-09 (×4): 21 mg via TRANSDERMAL
  Filled 2020-12-06 (×4): qty 1

## 2020-12-06 MED ORDER — ALUM & MAG HYDROXIDE-SIMETH 200-200-20 MG/5ML PO SUSP
30.0000 mL | Freq: Once | ORAL | Status: AC
  Administered 2020-12-06: 30 mL via ORAL
  Filled 2020-12-06: qty 30

## 2020-12-06 MED ORDER — METOPROLOL SUCCINATE ER 100 MG PO TB24
100.0000 mg | ORAL_TABLET | Freq: Every day | ORAL | Status: DC
  Administered 2020-12-06 – 2020-12-09 (×3): 100 mg via ORAL
  Filled 2020-12-06: qty 2
  Filled 2020-12-06 (×2): qty 1

## 2020-12-06 MED ORDER — HYDROCOD POLST-CPM POLST ER 10-8 MG/5ML PO SUER
5.0000 mL | Freq: Two times a day (BID) | ORAL | Status: DC | PRN
  Administered 2020-12-06: 5 mL via ORAL
  Filled 2020-12-06: qty 5

## 2020-12-06 MED ORDER — HEPARIN SODIUM (PORCINE) 5000 UNIT/ML IJ SOLN
5000.0000 [IU] | Freq: Three times a day (TID) | INTRAMUSCULAR | Status: DC
  Administered 2020-12-06 – 2020-12-09 (×9): 5000 [IU] via SUBCUTANEOUS
  Filled 2020-12-06 (×10): qty 1

## 2020-12-06 MED ORDER — CALCIUM CARBONATE ANTACID 500 MG PO CHEW
2.0000 | CHEWABLE_TABLET | Freq: Four times a day (QID) | ORAL | Status: DC | PRN
  Administered 2020-12-06: 400 mg via ORAL
  Filled 2020-12-06: qty 2

## 2020-12-06 MED ORDER — HYOSCYAMINE SULFATE 0.125 MG SL SUBL
0.2500 mg | SUBLINGUAL_TABLET | Freq: Once | SUBLINGUAL | Status: AC
  Administered 2020-12-06: 0.25 mg via SUBLINGUAL
  Filled 2020-12-06: qty 2

## 2020-12-06 MED ORDER — MORPHINE SULFATE (PF) 4 MG/ML IV SOLN
4.0000 mg | Freq: Once | INTRAVENOUS | Status: AC
  Administered 2020-12-06: 4 mg via INTRAVENOUS
  Filled 2020-12-06: qty 1

## 2020-12-06 MED ORDER — HYDRALAZINE HCL 50 MG PO TABS: 50.0000 mg | ORAL_TABLET | Freq: Every day | ORAL | Status: DC | PRN

## 2020-12-06 NOTE — Progress Notes (Signed)
[facility] hospitalist Short progress note  55 year old male presenting to the hospital with chest pain.  He tells me he has been vomiting for about 5 days now.  He has a sore throat at this point.  He does smoke cigarettes and has a chronic cough as well.  He was recently started on doxycycline for a "head cold" and worsening cough and he took 2 doses of this yesterday.  Subjective: Asking to go home.  I was later notified by the nurse that the patient was having chest pain again.  Today's Vitals   12/06/20 0930 12/06/20 1000 12/06/20 1030 12/06/20 1500  BP:      Pulse: 79 82 87   Resp: '15 14 12   '$ Temp:      SpO2: 100% 99% 98%   Weight:      Height:      PainSc:    7    Body mass index is 24.21 kg/m.    Principal Problem:   Chest pain -This is GI related (due to vomiting)-  given GI cocktail- this caused burning as he swallowed it - add twice daily IV Protonix and twice daily IV Pepcid and follow to see if pain, nausea and vomiting improved  Active Problems: Vomiting x 5 days -Abdomen is nontender-question if he had gastroenteritis -follow    ESRD on peritoneal dialysis -   Anemia of chronic kidney failure, stage 5   -Have consulted nephrology to assist with management    HTN (hypertension) -Continue losartan, hydralazine and metoprolol    Nicotine dependence -NicoDerm patch ordered  Debbe Odea, MD Pager: [email or web address]

## 2020-12-06 NOTE — ED Notes (Signed)
MD Damita Dunnings notified of EKG completion. Also notified that patient is requesting a nicotine patch. RN awaiting response.

## 2020-12-06 NOTE — H&P (Signed)
History and Physical    Larry [name] [id number] DOB: June 30, 1966 DOA: 12/06/2020  PCP: Gillis Santa, MD   Patient coming from: Home  I have personally briefly reviewed patient's old medical records in [place]  Chief Complaint: Chest pain, vomiting  HPI: Larry [name] is a 55 y.o. male with medical history significant for hypertension, ESRD on PD since 2019, anemia of CKD,nicotine dependence, as well as family history of CAD who presented to the ED with a complaint of chest pain and vomiting.  Patient was seen at the urgent care the day prior and was diagnosed with a respiratory tract infection after complaining of mostly sinus congestion, for which she was started on doxycycline.  He now presents with complaint of anterior chest wall pain starting 3 hours prior to arrival.  Pain is severe, nonradiating, worse with deep inspiration, associated shortness of breath as well as nonbloody, nonbilious, noncoffee-ground vomiting.  Unrelieved with TUMS at home.He has a nonproductive cough and no fever or chills.  Has no lower extremity pain or swelling.  he denies abdominal pain or change in bowel habits. ED Course: On arrival, BP 141/75, pulse 101, respirations 16 with O2 sat 98% on room air.  Afebrile.  Blood work significant for WBC 14,000, hemoglobin 9.5 which is his baseline.  High anion gap of 20 with bicarb of 9 but this also appears to be not far from baseline.  Lipase 31.  Troponin 129>109 EKG as reviewed by me : Sinus rhythm at 93, nonspecific ST-T wave changes.  QT C prolongation Imaging: CTA chest, no evidence of PE, cardiomegaly with prominent coronary artery atherosclerosis, emphysema  Patient was treated with nitroglycerin without any relief.  Dilaudid did help with pain.  Hospitalist consulted for admission for chest pain rule out.  Review of Systems: As per HPI otherwise all other systems on review of systems negative.    Past Medical History:  Diagnosis Date  .  Anemia   . Chronic kidney disease   . Hypertension   . Nephrotic syndrome     Past Surgical History:  Procedure Laterality Date  . CAPD INSERTION N/A 01/06/2018   Procedure: LAPAROSCOPIC INSERTION CONTINUOUS AMBULATORY PERITONEAL DIALYSIS  (CAPD) CATHETER;  Surgeon: Katha Cabal, MD;  Location: [facility] ORS;  Service: Vascular;  Laterality: N/A;  . CHOLECYSTECTOMY    . CYSTOSCOPY W/ URETERAL STENT REMOVAL Right 10/23/2017   Procedure: CYSTOSCOPY WITH STENT REPLACEMENT;  Surgeon: [name], MD;  Location: [facility] OR[facility];  Service: Urology;  Laterality: Right;  . CYSTOSCOPY WITH STENT PLACEMENT Right 09/30/2017   Procedure: CYSTOSCOPY WITH STENT PLACEMENT;  Surgeon: [name], MD;  Location: [facility];  Service: Urology;  Laterality: Right;  . CYSTOSCOPY/RETROGRADE/URETEROSCOPY Right 10/23/2017   Procedure: CYSTOSCOPY/RETROGRADE/URETEROSCOPY;  Surgeon: [name], MD;  Location: [facility] OR[facility];  Service: Urology;  Laterality: Right;  . RENAL BIOPSY       reports that he quit smoking about 3 years ago. His smoking use included cigarettes. He quit after 20.00 years of use. He has never used smokeless tobacco. He reports current alcohol use. He reports that he does not use drugs.  Allergies  Allergen Reactions  . Mushroom Extract Complex     Headache, migraines  . Amoxicillin Nausea And Vomiting    Weakness  Has patient had a PCN reaction causing immediate rash, facial/tongue/throat swelling, SOB or lightheadedness with hypotension: No Has patient had a PCN reaction causing severe rash involving mucus membranes or skin necrosis: No Has  patient had a PCN reaction that required hospitalization: No Has patient had a PCN reaction occurring within the last 10 years: No If all of the above answers are "NO", then may proceed with Cephalosporin use.     Family History  Problem Relation Age of Onset  . Diabetes Mother   . Kidney disease Mother        mother on dialysis  . CAD Mother   .  Diabetes Sister   . CAD Sister       Prior to Admission medications   Medication Sig Start Date End Date Taking? Authorizing Provider  calcium acetate (PHOSLO) 667 MG capsule Take 1,334 mg by mouth 3 (three) times daily. 12/03/20  Yes [provider]  doxycycline (VIBRAMYCIN) 100 MG capsule Take 100 mg by mouth 2 (two) times daily. 12/05/20  Yes [provider]  hydrALAZINE (APRESOLINE) 50 MG tablet Take 50 mg by mouth daily as needed (If diastolic bp is 90 or above).    Yes [provider]  ibuprofen (ADVIL,MOTRIN) 200 MG tablet Take 400 mg by mouth daily as needed for headache or moderate pain.   Yes [provider]  losartan (COZAAR) 50 MG tablet Take 50 mg by mouth daily. 12/25/19  Yes [provider]  metoprolol succinate (TOPROL-XL) 100 MG 24 hr tablet Take 100 mg by mouth daily.   Yes [provider]  ondansetron (ZOFRAN-ODT) 4 MG disintegrating tablet Take 4 mg by mouth every 8 (eight) hours as needed. 12/05/20  Yes [provider]  oxybutynin (DITROPAN) 5 MG tablet Take 5 mg by mouth 2 (two) times daily. 01/18/18  Yes [provider]  sertraline (ZOLOFT) 25 MG tablet Take 25 mg by mouth daily.  09/25/17  Yes [provider]  sodium bicarbonate 650 MG tablet Take 1,300 mg by mouth 2 (two) times daily.    Yes [provider]  acetaminophen (TYLENOL) 500 MG tablet Take by mouth.    [provider]  famotidine (PEPCID) 20 MG tablet Take 1 tablet (20 mg total) by mouth 2 (two) times daily. Patient taking differently: Take 20 mg by mouth daily as needed for heartburn.  09/27/17 09/27/18  Darel Hong, MD  nicotine (NICODERM CQ - DOSED IN MG/24 HOURS) 14 mg/24hr patch Place 14 mg onto the skin daily.    [provider]    Physical Exam: Vitals:   12/06/20 0230 12/06/20 0240 12/06/20 0337 12/06/20 0400  BP: 113/61 (!) 121/107 127/68 133/79  Pulse: 86 86 90 (!) 59  Resp: 17 (!) 23 (!) 31 (!)  22  Temp:      SpO2: 97% 97% 99% 100%  Weight:      Height:         Vitals:   12/06/20 0230 12/06/20 0240 12/06/20 0337 12/06/20 0400  BP: 113/61 (!) 121/107 127/68 133/79  Pulse: 86 86 90 (!) 59  Resp: 17 (!) 23 (!) 31 (!) 22  Temp:      SpO2: 97% 97% 99% 100%  Weight:      Height:          Constitutional:  Alert and oriented x 3 . Not in any apparent distress HEENT:      Head: Normocephalic and atraumatic.         Eyes: PERLA, EOMI, Conjunctivae are normal. Sclera is non-icteric.       Mouth/Throat: Mucous membranes are moist.       Neck: Supple with no signs of meningismus. Cardiovascular: Regular  rate and rhythm. No murmurs, gallops, or rubs. 2+ symmetrical distal pulses are present . No JVD. No LE edema Respiratory: Respiratory effort normal .Lungs sounds clear bilaterally. No wheezes, crackles, or rhonchi.  Gastrointestinal: Soft, non tender, and non distended with positive bowel sounds.  Genitourinary: No CVA tenderness. Musculoskeletal: Nontender with normal range of motion in all extremities. No cyanosis, or erythema of extremities. Neurologic:  Face is symmetric. Moving all extremities. No gross focal neurologic deficits . Skin: Skin is warm, dry.  No rash or ulcers Psychiatric: Mood and affect are normal    Labs on Admission: I have personally reviewed following labs and imaging studies  CBC: Recent Labs  Lab 12/06/20 0019  WBC 14.1*  NEUTROABS 10.7*  HGB 9.5*  HCT 28.7*  MCV 91.4  PLT [id number]   Basic Metabolic Panel: Recent Labs  Lab 12/06/20 0019  NA 134*  K 4.2  CL 105  CO2 9*  GLUCOSE 128*  BUN 139*  CREATININE 23.62*  CALCIUM 7.0*   GFR: Estimated Creatinine Clearance: 3.2 mL/min (A) (by C-G formula based on SCr of 23.62 mg/dL (H)). Liver Function Tests: Recent Labs  Lab 12/06/20 0019  AST 13*  ALT 11  ALKPHOS 59  BILITOT 0.8  PROT 6.0*  ALBUMIN 3.2*   Recent Labs  Lab 12/06/20 0019  LIPASE 31   No results for input(s):  AMMONIA in the last 168 hours. Coagulation Profile: No results for input(s): INR, PROTIME in the last 168 hours. Cardiac Enzymes: No results for input(s): CKTOTAL, CKMB, CKMBINDEX, TROPONINI in the last 168 hours. BNP (last 3 results) No results for input(s): PROBNP in the last 8760 hours. HbA1C: No results for input(s): HGBA1C in the last 72 hours. CBG: No results for input(s): GLUCAP in the last 168 hours. Lipid Profile: No results for input(s): CHOL, HDL, LDLCALC, TRIG, CHOLHDL, LDLDIRECT in the last 72 hours. Thyroid Function Tests: No results for input(s): TSH, T4TOTAL, FREET4, T3FREE, THYROIDAB in the last 72 hours. Anemia Panel: No results for input(s): VITAMINB12, FOLATE, FERRITIN, TIBC, IRON, RETICCTPCT in the last 72 hours. Urine analysis:    Component Value Date/Time   COLORURINE YELLOW (A) 12/14/2018 1613   APPEARANCEUR CLEAR (A) 12/14/2018 1613   APPEARANCEUR Clear 10/15/2017 1512   LABSPEC 1.017 12/14/2018 1613   LABSPEC 1.011 01/18/2015 1423   PHURINE 7.0 12/14/2018 1613   GLUCOSEU 150 (A) 12/14/2018 1613   GLUCOSEU >=500 01/18/2015 1423   HGBUR SMALL (A) 12/14/2018 1613   BILIRUBINUR NEGATIVE 12/14/2018 1613   BILIRUBINUR Negative 10/15/2017 1512   BILIRUBINUR Negative 01/18/2015 1423   KETONESUR 5 (A) 12/14/2018 1613   PROTEINUR >=300 (A) 12/14/2018 1613   NITRITE NEGATIVE 12/14/2018 1613   LEUKOCYTESUR NEGATIVE 12/14/2018 1613   LEUKOCYTESUR Negative 01/18/2015 1423    Radiological Exams on Admission: CT Angio Chest PE W and/or Wo Contrast  Result Date: 12/06/2020 CLINICAL DATA:  Chest pain, a respiratory infection, emesis EXAM: CT ANGIOGRAPHY CHEST WITH CONTRAST TECHNIQUE: Multidetector CT imaging of the chest was performed using the standard protocol during bolus administration of intravenous contrast. Multiplanar CT image reconstructions and MIPs were obtained to evaluate the vascular anatomy. CONTRAST:  47m OMNIPAQUE IOHEXOL 350 MG/ML SOLN COMPARISON:   12/06/2020, 03/07/2015 FINDINGS: Cardiovascular: This is a technically adequate evaluation of the pulmonary vasculature. No filling defects or pulmonary emboli. The heart is enlarged without pericardial effusion. Extensive atherosclerosis within the coronary vasculature greatest in the LAD and circumflex distributions. No evidence of thoracic aortic aneurysm or dissection. Mediastinum/Nodes: No  enlarged mediastinal, hilar, or axillary lymph nodes. Thyroid gland, trachea, and esophagus demonstrate no significant findings. Lungs/Pleura: Mild emphysematous changes at the apices. Hypoventilatory changes at the lung bases. No airspace disease, effusion, or pneumothorax. The central airways are patent. Upper Abdomen: No acute abnormality. Musculoskeletal: No acute or destructive bony lesions. Reconstructed images demonstrate no additional findings. Review of the MIP images confirms the above findings. IMPRESSION: 1. No evidence of pulmonary embolus. 2. Cardiomegaly, with prominent coronary artery atherosclerosis. 3.  Emphysema (ICD10-J43.9). Electronically Signed   By: Randa Ngo M.D.   On: 12/06/2020 03:57   DG Chest Port 1 View  Result Date: 12/06/2020 CLINICAL DATA:  Chest pain, upper respiratory infection EXAM: PORTABLE CHEST 1 VIEW COMPARISON:  12/14/2018 FINDINGS: The heart size and mediastinal contours are within normal limits. Both lungs are clear. The visualized skeletal structures are unremarkable. IMPRESSION: No active disease. Electronically Signed   By: Randa Ngo M.D.   On: 12/06/2020 01:42     Assessment/Plan 55 year old male with history of hypertension, ESRD on PD since 2019, anemia of CKD, nicotine dependence, as well as family history of CAD in 88s presenting with chest pain with both typical and atypical features, as well as vomiting in the setting of URI/sinus symptoms    Chest pain -Patient with CAD risk factors presents with chest pain, in the setting URI symptoms.  EKG nonacute.   Troponin elevated at 129>109 the setting of ESRD -CTA chest without evidence of PE.  Shows cardiomegaly with prominent coronary artery atherosclerosis and emphysema --Had a negative stress test at [facility] 10/30/2017 -Pain relieved with morphine, not nitroglycerin -Patient does have CAD risk factors, but pain is worsened with taking a deep breath and may be related to coughing from his URI -Echocardiogram. -Cardiology consult for further recommendations  URI -Patient has sinus congestion, with coughing, nausea -Was started on doxycycline by urgent care    Anemia of chronic kidney failure, stage 5 ([place]) -Hemoglobin of 9.5 which is baseline    ESRD on peritoneal dialysis [facility]) -nephrology consult    HTN (hypertension) -BP controlled.  Continue home meds    Nicotine Dependence -smokes a pack a day -Nicotine patch    DVT prophylaxis: Heparin subcu Code Status: full code  Family Communication:  none  Disposition Plan: Back to previous home environment Consults called: Cardiology Status: Observation    Athena Masse MD [facility]     12/06/2020, 4:44 AM

## 2020-12-06 NOTE — ED Triage Notes (Signed)
EMS brings pt in from home for c/o ; seen at [facility] yesterday, dx with URI; c/o CP, cough, vomiting

## 2020-12-06 NOTE — ED Provider Notes (Signed)
[facility] Emergency Department Provider Note  ____________________________________________   Event Date/Time   First MD Initiated Contact with Patient 12/06/20 0119     (approximate)  I have reviewed the triage vital signs and the nursing notes.   HISTORY  Chief Complaint Chest Pain    HPI Larry [name] is a 55 y.o. male with history of end-stage renal disease on peritoneal dialysis, tobacco use, family history of CAD who presents to the emergency department with complaints of central chest pressure, sharp pain that is worse with deep inspiration.  Has associated shortness of breath, diaphoresis, nausea and vomiting.  No history of ACS that he is aware of, PE or DVT.  He does report he was seen in urgent care recently for sinus congestion and was diagnosed with a URI.  He denies any fever or productive cough.  He has never had pain like this before.  States it started 3 hours prior to arrival.  Describes as severe in nature without radiation.  He denies any missed dialysis.  States he does peritoneal dialysis 6 days a week.  He denies any abdominal pain.        Past Medical History:  Diagnosis Date  . Anemia   . Chronic kidney disease   . Hypertension   . Nephrotic syndrome     Patient Active Problem List   Diagnosis Date Noted  . Chest pain 12/06/2020  . ESRD on peritoneal dialysis ([place]) 10/29/2020  . Renal dialysis device, implant, or graft complication [id number]  . Open displaced fracture of proximal phalanx of left thumb 02/29/2020  . Stage 5 chronic kidney disease not on chronic dialysis ([place]) 12/28/2017  . Hydronephrosis 10/15/2017  . AKI (acute kidney injury) ([place]) 09/30/2017  . even 1 per today and did not I would not with an order 02/07/2015    Past Surgical History:  Procedure Laterality Date  . CAPD INSERTION N/A 01/06/2018   Procedure: LAPAROSCOPIC INSERTION CONTINUOUS AMBULATORY PERITONEAL DIALYSIS  (CAPD) CATHETER;  Surgeon:  Katha Cabal, MD;  Location: [facility] ORS;  Service: Vascular;  Laterality: N/A;  . CHOLECYSTECTOMY    . CYSTOSCOPY W/ URETERAL STENT REMOVAL Right 10/23/2017   Procedure: CYSTOSCOPY WITH STENT REPLACEMENT;  Surgeon: [name], MD;  Location: [facility] ORS;  Service: Urology;  Laterality: Right;  . CYSTOSCOPY WITH STENT PLACEMENT Right 09/30/2017   Procedure: CYSTOSCOPY WITH STENT PLACEMENT;  Surgeon: [name], MD;  Location: [facility] ORS;  Service: Urology;  Laterality: Right;  . CYSTOSCOPY/RETROGRADE/URETEROSCOPY Right 10/23/2017   Procedure: CYSTOSCOPY/RETROGRADE/URETEROSCOPY;  Surgeon: [name], MD;  Location: [facility] ORS;  Service: Urology;  Laterality: Right;  . RENAL BIOPSY      Prior to Admission medications   Medication Sig Start Date End Date Taking? Authorizing Provider  calcium acetate (PHOSLO) 667 MG capsule Take 1,334 mg by mouth 3 (three) times daily. 12/03/20  Yes [provider]  doxycycline (VIBRAMYCIN) 100 MG capsule Take 100 mg by mouth 2 (two) times daily. 12/05/20  Yes [provider]  hydrALAZINE (APRESOLINE) 50 MG tablet Take 50 mg by mouth daily as needed (If diastolic bp is 90 or above).    Yes [provider]  ibuprofen (ADVIL,MOTRIN) 200 MG tablet Take 400 mg by mouth daily as needed for headache or moderate pain.   Yes [provider]  losartan (COZAAR) 50 MG tablet Take 50 mg by mouth daily. 12/25/19  Yes [provider]  metoprolol succinate (TOPROL-XL) 100 MG 24 hr  tablet Take 100 mg by mouth daily.   Yes [provider]  ondansetron (ZOFRAN-ODT) 4 MG disintegrating tablet Take 4 mg by mouth every 8 (eight) hours as needed. 12/05/20  Yes [provider]  oxybutynin (DITROPAN) 5 MG tablet Take 5 mg by mouth 2 (two) times daily. 01/18/18  Yes [provider]  sertraline (ZOLOFT) 25 MG tablet Take 25 mg by mouth daily.  09/25/17  Yes [provider]  sodium bicarbonate 650 MG tablet Take  1,300 mg by mouth 2 (two) times daily.    Yes [provider]  acetaminophen (TYLENOL) 500 MG tablet Take by mouth.    [provider]  famotidine (PEPCID) 20 MG tablet Take 1 tablet (20 mg total) by mouth 2 (two) times daily. Patient taking differently: Take 20 mg by mouth daily as needed for heartburn.  09/27/17 09/27/18  Darel Hong, MD  nicotine (NICODERM CQ - DOSED IN MG/24 HOURS) 14 mg/24hr patch Place 14 mg onto the skin daily.    [provider]    Allergies Mushroom extract complex and Amoxicillin  Family History  Problem Relation Age of Onset  . Diabetes Mother   . Kidney disease Mother        mother on dialysis  . CAD Mother   . Diabetes Sister   . CAD Sister     Social History Social History   Tobacco Use  . Smoking status: Former Smoker    Years: 20.00    Types: Cigarettes    Quit date: 08/22/2017    Years since quitting: 3.2  . Smokeless tobacco: Never Used  Vaping Use  . Vaping Use: Never used  Substance Use Topics  . Alcohol use: Yes    Comment: occ. alcohol use  . Drug use: No    Review of Systems Constitutional: No fever. Eyes: No visual changes. ENT: No sore throat. Cardiovascular: +chest pain. Respiratory: +shortness of breath. Gastrointestinal: + nausea, vomiting Genitourinary: Negative for dysuria. Musculoskeletal: Negative for back pain. Skin: Negative for rash. Neurological: Negative for focal weakness or numbness.  ____________________________________________   PHYSICAL EXAM:  VITAL SIGNS: ED Triage Vitals  Enc Vitals Group     BP 12/06/20 0018 (!) 141/75     Pulse Rate 12/06/20 0018 (!) 101     Resp 12/06/20 0018 16     Temp 12/06/20 0018 98 F (36.7 C)     Temp src --      SpO2 12/06/20 0011 98 %     Weight 12/06/20 0017 150 lb (68 kg)     Height 12/06/20 0017 '5\' 6"'$  (1.676 m)     Head Circumference --      Peak Flow --      Pain Score 12/06/20 0017 10     Pain Loc --      Pain Edu? --       Excl. in [place]? --    CONSTITUTIONAL: Alert and oriented and responds appropriately to questions.  Appears older than stated age.  Chronically ill-appearing.  Appears very uncomfortable.  Actively vomiting. HEAD: Normocephalic EYES: Conjunctivae clear, pupils appear equal, EOM appear intact ENT: normal nose; moist mucous membranes NECK: Supple, normal ROM CARD: RRR; S1 and S2 appreciated; no murmurs, no clicks, no rubs, no gallops CHEST:  Chest wall is very minimally tender to palpation over the sternum.  No crepitus, ecchymosis, erythema, warmth, rash or other lesions present.   RESP: Normal chest excursion without splinting or tachypnea; breath sounds clear and equal  bilaterally; no wheezes, no rhonchi, no rales, no hypoxia or respiratory distress, speaking full sentences ABD/GI: Normal bowel sounds; non-distended; soft, non-tender, no rebound, no guarding, no peritoneal signs, no hepatosplenomegaly BACK: The back appears normal EXT: Normal ROM in all joints; no deformity noted, no edema; no cyanosis, no calf tenderness or calf swelling SKIN: Normal color for age and race; warm; no rash on exposed skin NEURO: Moves all extremities equally PSYCH: The patient's mood and manner are appropriate.  ____________________________________________   LABS (all labs ordered are listed, but only abnormal results are displayed)  Labs Reviewed  CBC WITH DIFFERENTIAL/PLATELET - Abnormal; Notable for the following components:      Result Value   WBC 14.1 (*)    RBC 3.14 (*)    Hemoglobin 9.5 (*)    HCT 28.7 (*)    RDW 16.0 (*)    Neutro Abs 10.7 (*)    Monocytes Absolute 1.1 (*)    Abs Immature Granulocytes 0.17 (*)    All other components within normal limits  BASIC METABOLIC PANEL - Abnormal; Notable for the following components:   Sodium 134 (*)    CO2 9 (*)    Glucose, Bld 128 (*)    BUN 139 (*)    Creatinine, Ser 23.62 (*)    Calcium 7.0 (*)    GFR, Estimated 2 (*)    Anion gap 20 (*)     All other components within normal limits  HEPATIC FUNCTION PANEL - Abnormal; Notable for the following components:   Total Protein 6.0 (*)    Albumin 3.2 (*)    AST 13 (*)    All other components within normal limits  TROPONIN I (HIGH SENSITIVITY) - Abnormal; Notable for the following components:   Troponin I (High Sensitivity) 129 (*)    All other components within normal limits  TROPONIN I (HIGH SENSITIVITY) - Abnormal; Notable for the following components:   Troponin I (High Sensitivity) 109 (*)    All other components within normal limits  RESP PANEL BY RT-PCR (FLU A&B, COVID) ARPGX2  LIPASE, BLOOD   ____________________________________________  EKG   EKG Interpretation  Date/Time:  Thursday December 06 2020 01:45:19 EDT Ventricular Rate:  93 PR Interval:  130 QRS Duration: 90 QT Interval:  410 QTC Calculation: 510 R Axis:   38 Text Interpretation: Sinus rhythm Probable left atrial enlargement Probable left ventricular hypertrophy Prolonged QT interval Baseline wander in lead(s) V3 Confirmed by Pryor Curia [phone]) on 12/06/2020 1:52:09 AM       ____________________________________________  RADIOLOGY Jessie Foot Lavada Langsam, personally viewed and evaluated these images (plain radiographs) as part of my medical decision making, as well as reviewing the written report by the radiologist.  ED MD interpretation: Chest x-ray clear.  CT chest shows no PE.  Official radiology report(s): CT Angio Chest PE W and/or Wo Contrast  Result Date: 12/06/2020 CLINICAL DATA:  Chest pain, a respiratory infection, emesis EXAM: CT ANGIOGRAPHY CHEST WITH CONTRAST TECHNIQUE: Multidetector CT imaging of the chest was performed using the standard protocol during bolus administration of intravenous contrast. Multiplanar CT image reconstructions and MIPs were obtained to evaluate the vascular anatomy. CONTRAST:  6m OMNIPAQUE IOHEXOL 350 MG/ML SOLN COMPARISON:  12/06/2020, 03/07/2015 FINDINGS:  Cardiovascular: This is a technically adequate evaluation of the pulmonary vasculature. No filling defects or pulmonary emboli. The heart is enlarged without pericardial effusion. Extensive atherosclerosis within the coronary vasculature greatest in the LAD and circumflex distributions. No evidence of thoracic aortic aneurysm or dissection.  Mediastinum/Nodes: No enlarged mediastinal, hilar, or axillary lymph nodes. Thyroid gland, trachea, and esophagus demonstrate no significant findings. Lungs/Pleura: Mild emphysematous changes at the apices. Hypoventilatory changes at the lung bases. No airspace disease, effusion, or pneumothorax. The central airways are patent. Upper Abdomen: No acute abnormality. Musculoskeletal: No acute or destructive bony lesions. Reconstructed images demonstrate no additional findings. Review of the MIP images confirms the above findings. IMPRESSION: 1. No evidence of pulmonary embolus. 2. Cardiomegaly, with prominent coronary artery atherosclerosis. 3.  Emphysema (ICD10-J43.9). Electronically Signed   By: Randa Ngo M.D.   On: 12/06/2020 03:57   DG Chest Port 1 View  Result Date: 12/06/2020 CLINICAL DATA:  Chest pain, upper respiratory infection EXAM: PORTABLE CHEST 1 VIEW COMPARISON:  12/14/2018 FINDINGS: The heart size and mediastinal contours are within normal limits. Both lungs are clear. The visualized skeletal structures are unremarkable. IMPRESSION: No active disease. Electronically Signed   By: Randa Ngo M.D.   On: 12/06/2020 01:42    ____________________________________________   PROCEDURES  Procedure(s) performed (including Critical Care):  Procedures   ____________________________________________   INITIAL IMPRESSION / ASSESSMENT AND PLAN / ED COURSE  As part of my medical decision making, I reviewed the following data within the [place] notes reviewed and incorporated, Labs reviewed , EKG interpreted , Old EKG reviewed,  Old chart reviewed, Radiograph reviewed , Discussed with admitting physician  and Notes from prior ED visits         Patient here with complaints of chest pain.  There are some typical and atypical components.  He appears to be in significant amount of discomfort at this time is actively vomiting.  Will give morphine, Zofran, aspirin.  His first troponin has come back elevated at 129 with no old for comparison and this is in the setting of end-stage renal disease.  EKG shows no new ischemic change compared to previous.  Will obtain CTA of the chest given he reports pleuritic pain.  Differential includes PE, dissection, pneumothorax, pneumonia.  ED PROGRESS  Imaging shows no pneumothorax, edema or infiltrate.  CT shows no PE, dissection.  Patient had no improvement with nitroglycerin.  Pain significantly improved after Dilaudid.  Second troponin is improving slightly but still elevated.  This seems atypical for ACS but given his elevated troponins with no old for conspiracy and, I have recommended admission for observation.  Patient comfortable with this plan.  4:35 AM Discussed patient's case with hospitalist, Dr. Damita Dunnings.  I have recommended admission and patient (and family if present) agree with this plan. Admitting physician will place admission orders.   I reviewed all nursing notes, vitals, pertinent previous records and reviewed/interpreted all EKGs, lab and urine results, imaging (as available).   ____________________________________________   FINAL CLINICAL IMPRESSION(S) / ED DIAGNOSES  Final diagnoses:  Elevated troponin  Nonspecific chest pain     ED Discharge Orders    None      *Please note:  Larry [name] was evaluated in Emergency Department on 12/06/2020 for the symptoms described in the history of present illness. He was evaluated in the context of the global COVID-19 pandemic, which necessitated consideration that the patient might be at risk for infection with the  SARS-CoV-2 virus that causes COVID-19. Institutional protocols and algorithms that pertain to the evaluation of patients at risk for COVID-19 are in a state of rapid change based on information released by regulatory bodies including the CDC and federal and state organizations. These policies and algorithms were  followed during the patient's care in the ED.  Some ED evaluations and interventions may be delayed as a result of limited staffing during and the pandemic.*   Note:  This document was prepared using Dragon voice recognition software and may include unintentional dictation errors.   Latoiya Maradiaga, Delice Bison, DO 12/06/20 [phone]

## 2020-12-06 NOTE — ED Triage Notes (Signed)
Pt to ED via EMS from home. Pt states 3 hours ago he started having chest pain, pt was seen [facility] yesterday and dx with URI. Pt has no productive cough, denies fevers at home, Pt also c/o emesis

## 2020-12-06 NOTE — ED Notes (Signed)
Lab reports troponin 129; acuity level changed; pt taken to room 26 via w/c and assisted into hosp gown & on card monitor; pt noted to have peritoneal dialysis cath in place; st last used tonight; Dr Leonides Schanz and care nurse [facility] notified of pt and trop results

## 2020-12-06 NOTE — ED Notes (Signed)
Patient reporting thick post nasal drip causing throat discomfort. [place] MD notified, awaiting further orders.

## 2020-12-06 NOTE — Progress Notes (Signed)
*  PRELIMINARY RESULTS* Echocardiogram 2D Echocardiogram has been performed.  Larry [name] 12/06/2020, 1:03 PM

## 2020-12-06 NOTE — Progress Notes (Signed)
[facility], [place] 12/06/20  Subjective:   LOS: 0   Patient known to our practice from outpatient dialysis. He is followed by Dr. Holley Raring for peritoneal dialysis as outpatient He presents for midepigastric chest pain and nausea and vomiting When seen earlier today, he states that his chest pain is resolved and he would like to be discharged   Objective:  Vital signs in last 24 hours:  Temp:  [98 F (36.7 C)] 98 F (36.7 C) (03/17 0018) Pulse Rate:  [59-101] 87 (03/17 1030) Resp:  [12-31] 12 (03/17 1030) BP: (104-141)/(54-107) 137/70 (03/17 0730) SpO2:  [97 %-100 %] 98 % (03/17 1030) Weight:  [68 kg] 68 kg (03/17 0017)  Weight change:  Filed Weights   12/06/20 0017  Weight: 68 kg    Intake/Output:   No intake or output data in the 24 hours ending 12/06/20 1542  Physical Exam: General:  No acute distress, laying in the bed  HEENT  anicteric, moist oral mucous membrane  Pulm/lungs  normal breathing effort, lungs are clear to auscultation  CVS/Heart  regular rhythm, no rub or gallop  Abdomen:   Soft, nontender  Extremities:  No peripheral edema  Neurologic:  Alert, oriented, able to follow commands  Skin:  No acute rashes    Basic Metabolic Panel:  Recent Labs  Lab 12/06/20 0019 12/06/20 0520  NA 134*  --   K 4.2  --   CL 105  --   CO2 9*  --   GLUCOSE 128*  --   BUN 139*  --   CREATININE 23.62* 22.99*  CALCIUM 7.0*  --      CBC: Recent Labs  Lab 12/06/20 0019 12/06/20 0520  WBC 14.1* 16.0*  NEUTROABS 10.7*  --   HGB 9.5* 9.1*  HCT 28.7* 28.3*  MCV 91.4 93.4  PLT 242 239     No results found for: HEPBSAG, HEPBSAB, HEPBIGM    Microbiology:  Recent Results (from the past 240 hour(s))  Resp Panel by RT-PCR (Flu A&B, Covid) Nasopharyngeal Swab     Status: None   Collection Time: 12/06/20  1:49 AM   Specimen: Nasopharyngeal Swab; Nasopharyngeal(NP) swabs in vial transport medium  Result Value Ref Range Status    SARS Coronavirus 2 by RT PCR NEGATIVE NEGATIVE Final    Comment: (NOTE) SARS-CoV-2 target nucleic acids are NOT DETECTED.  The SARS-CoV-2 RNA is generally detectable in upper respiratory specimens during the acute phase of infection. The lowest concentration of SARS-CoV-2 viral copies this assay can detect is 138 copies/mL. A negative result does not preclude SARS-Cov-2 infection and should not be used as the sole basis for treatment or other patient management decisions. A negative result may occur with  improper specimen collection/handling, submission of specimen other than nasopharyngeal swab, presence of viral mutation(s) within the areas targeted by this assay, and inadequate number of viral copies(<138 copies/mL). A negative result must be combined with clinical observations, patient history, and epidemiological information. The expected result is Negative.  Fact Sheet for Patients:  EntrepreneurPulse.com.au  Fact Sheet for Healthcare Providers:  IncredibleEmployment.be  This test is no t yet approved or cleared by the [place] FDA and  has been authorized for detection and/or diagnosis of SARS-CoV-2 by FDA under an Emergency Use Authorization (EUA). This EUA will remain  in effect (meaning this test can be used) for the duration of the COVID-19 declaration under Section 564(b)(1) of the Act, 21 U.S.C.section 360bbb-3(b)(1), unless the authorization is terminated  or revoked sooner.       Influenza A by PCR NEGATIVE NEGATIVE Final   Influenza B by PCR NEGATIVE NEGATIVE Final    Comment: (NOTE) The Xpert Xpress SARS-CoV-2/FLU/RSV plus assay is intended as an aid in the diagnosis of influenza from Nasopharyngeal swab specimens and should not be used as a sole basis for treatment. Nasal washings and aspirates are unacceptable for Xpert Xpress SARS-CoV-2/FLU/RSV testing.  Fact Sheet for  Patients: EntrepreneurPulse.com.au  Fact Sheet for Healthcare Providers: IncredibleEmployment.be  This test is not yet approved or cleared by the [place] FDA and has been authorized for detection and/or diagnosis of SARS-CoV-2 by FDA under an Emergency Use Authorization (EUA). This EUA will remain in effect (meaning this test can be used) for the duration of the COVID-19 declaration under Section 564(b)(1) of the Act, 21 U.S.C. section 360bbb-3(b)(1), unless the authorization is terminated or revoked.  Performed at [facility], [address] [place],  [address]     Coagulation Studies: No results for input(s): LABPROT, INR in the last 72 hours.  Urinalysis: No results for input(s): COLORURINE, LABSPEC, PHURINE, GLUCOSEU, HGBUR, BILIRUBINUR, KETONESUR, PROTEINUR, UROBILINOGEN, NITRITE, LEUKOCYTESUR in the last 72 hours.  Invalid input(s): APPERANCEUR    Imaging: CT Angio Chest PE W and/or Wo Contrast  Result Date: 12/06/2020 CLINICAL DATA:  Chest pain, a respiratory infection, emesis EXAM: CT ANGIOGRAPHY CHEST WITH CONTRAST TECHNIQUE: Multidetector CT imaging of the chest was performed using the standard protocol during bolus administration of intravenous contrast. Multiplanar CT image reconstructions and MIPs were obtained to evaluate the vascular anatomy. CONTRAST:  76m OMNIPAQUE IOHEXOL 350 MG/ML SOLN COMPARISON:  12/06/2020, 03/07/2015 FINDINGS: Cardiovascular: This is a technically adequate evaluation of the pulmonary vasculature. No filling defects or pulmonary emboli. The heart is enlarged without pericardial effusion. Extensive atherosclerosis within the coronary vasculature greatest in the LAD and circumflex distributions. No evidence of thoracic aortic aneurysm or dissection. Mediastinum/Nodes: No enlarged mediastinal, hilar, or axillary lymph nodes. Thyroid gland, trachea, and esophagus demonstrate no significant  findings. Lungs/Pleura: Mild emphysematous changes at the apices. Hypoventilatory changes at the lung bases. No airspace disease, effusion, or pneumothorax. The central airways are patent. Upper Abdomen: No acute abnormality. Musculoskeletal: No acute or destructive bony lesions. Reconstructed images demonstrate no additional findings. Review of the MIP images confirms the above findings. IMPRESSION: 1. No evidence of pulmonary embolus. 2. Cardiomegaly, with prominent coronary artery atherosclerosis. 3.  Emphysema (ICD10-J43.9). Electronically Signed   By: MRanda NgoM.D.   On: 12/06/2020 03:57   DG Chest Port 1 View  Result Date: 12/06/2020 CLINICAL DATA:  Chest pain, upper respiratory infection EXAM: PORTABLE CHEST 1 VIEW COMPARISON:  12/14/2018 FINDINGS: The heart size and mediastinal contours are within normal limits. Both lungs are clear. The visualized skeletal structures are unremarkable. IMPRESSION: No active disease. Electronically Signed   By: MRanda NgoM.D.   On: 12/06/2020 01:42   ECHOCARDIOGRAM COMPLETE  Result Date: 12/06/2020    ECHOCARDIOGRAM REPORT   Patient Name:   Larry SOLDANDate of Exam: 12/06/2020 Medical Rec #:  [id number]      Height:       66.0 in Accession #:    [id number]     Weight:       150.0 lb Date of Birth:  3Oct 04, 1967       BSA:          1.770 m Patient Age:    89years  BP:           150/90 mmHg Patient Gender: M               HR:           86 bpm. Exam Location:  [facility] Procedure: 2D Echo, Color Doppler, Cardiac Doppler and Strain Analysis Indications:     Chest pain R07.9  History:         Patient has no prior history of Echocardiogram examinations.                  Risk Factors:Hypertension. CKD.  Sonographer:     Sherrie Sport RDCS (AE) Referring Phys:  [id number] Athena Masse Diagnosing Phys: Ida Rogue MD  Sonographer Comments: Global longitudinal strain was attempted. IMPRESSIONS  1. Left ventricular ejection fraction, by estimation, is 55 to 60%.  The left ventricle has normal function. The left ventricle has no regional wall motion abnormalities. There is moderate left ventricular hypertrophy. Left ventricular diastolic parameters are consistent with Grade II diastolic dysfunction (pseudonormalization). The average left ventricular global longitudinal strain is -9.4 %. The global longitudinal strain is abnormal.  2. Right ventricular systolic function is normal. The right ventricular size is normal. FINDINGS  Left Ventricle: Left ventricular ejection fraction, by estimation, is 55 to 60%. The left ventricle has normal function. The left ventricle has no regional wall motion abnormalities. The average left ventricular global longitudinal strain is -9.4 %. The  global longitudinal strain is abnormal. The left ventricular internal cavity size was normal in size. There is moderate left ventricular hypertrophy. Left ventricular diastolic parameters are consistent with Grade II diastolic dysfunction (pseudonormalization). Right Ventricle: The right ventricular size is normal. No increase in right ventricular wall thickness. Right ventricular systolic function is normal. Left Atrium: Left atrial size was normal in size. Right Atrium: Right atrial size was normal in size. Pericardium: There is no evidence of pericardial effusion. Mitral Valve: The mitral valve is normal in structure. No evidence of mitral valve regurgitation. No evidence of mitral valve stenosis. Tricuspid Valve: The tricuspid valve is normal in structure. Tricuspid valve regurgitation is mild . No evidence of tricuspid stenosis. Aortic Valve: The aortic valve was not well visualized. Aortic valve regurgitation is not visualized. No aortic stenosis is present. Aortic valve mean gradient measures 6.0 mmHg. Aortic valve peak gradient measures 11.7 mmHg. Aortic valve area, by VTI measures 3.30 cm. Pulmonic Valve: The pulmonic valve was normal in structure. Pulmonic valve regurgitation is not  visualized. No evidence of pulmonic stenosis. Aorta: The aortic root is normal in size and structure. Venous: The inferior vena cava is normal in size with greater than 50% respiratory variability, suggesting right atrial pressure of 3 mmHg. IAS/Shunts: No atrial level shunt detected by color flow Doppler.  LEFT VENTRICLE PLAX 2D LVIDd:         4.35 cm  Diastology LVIDs:         2.43 cm  LV e' medial:    6.20 cm/s LV PW:         1.45 cm  LV E/e' medial:  14.9 LV IVS:        1.08 cm  LV e' lateral:   6.64 cm/s LVOT diam:     2.00 cm  LV E/e' lateral: 13.9 LV SV:         78 LV SV Index:   44       2D Longitudinal Strain LVOT Area:     3.14 cm 2D  Strain GLS Avg:     -9.4 %                          3D Volume EF:                         3D EF:        53 %                         LV EDV:       193 ml                         LV ESV:       91 ml                         LV SV:        102 ml RIGHT VENTRICLE RV Basal diam:  3.96 cm RV S prime:     20.20 cm/s TAPSE (M-mode): 4.0 cm LEFT ATRIUM             Index       RIGHT ATRIUM           Index LA diam:        3.80 cm 2.15 cm/m  RA Area:     17.80 cm LA Vol (A2C):   98.9 ml 55.89 ml/m RA Volume:   53.60 ml  30.29 ml/m LA Vol (A4C):   92.5 ml 52.27 ml/m LA Biplane Vol: 97.1 ml 54.87 ml/m  AORTIC VALVE                    PULMONIC VALVE AV Area (Vmax):    3.10 cm     PV Vmax:        1.10 m/s AV Area (Vmean):   2.97 cm     PV Peak grad:   4.8 mmHg AV Area (VTI):     3.30 cm     RVOT Peak grad: 5 mmHg AV Vmax:           171.00 cm/s AV Vmean:          113.000 cm/s AV VTI:            0.235 m AV Peak Grad:      11.7 mmHg AV Mean Grad:      6.0 mmHg LVOT Vmax:         169.00 cm/s LVOT Vmean:        107.000 cm/s LVOT VTI:          0.247 m LVOT/AV VTI ratio: 1.05  AORTA Ao Root diam: 3.20 cm MITRAL VALVE               TRICUSPID VALVE MV Area (PHT): 3.40 cm    TR Peak grad:   18.7 mmHg MV Decel Time: 223 msec    TR Vmax:        216.00 cm/s MV E velocity: 92.10 cm/s MV A  velocity: 71.60 cm/s  SHUNTS MV E/A ratio:  1.29        Systemic VTI:  0.25 m                            Systemic Diam: 2.00 cm Ida Rogue MD Electronically signed by Ida Rogue MD  Signature Date/Time: 12/06/2020/3:31:49 PM    Final      Medications:   . famotidine (PEPCID) IV 20 mg (12/06/20 1459)   . [START ON 12/07/2020] aspirin EC  81 mg Oral Daily  . heparin  5,000 Units Subcutaneous Q8H  . losartan  50 mg Oral Daily  . metoprolol succinate  100 mg Oral Daily  . nicotine  21 mg Transdermal Daily  . pantoprazole (PROTONIX) IV  40 mg Intravenous Q12H  . sertraline  25 mg Oral Daily   acetaminophen, hydrALAZINE, menthol-cetylpyridinium, nitroGLYCERIN, ondansetron (ZOFRAN) IV  Assessment/ Plan:  55 y.o. male with end-stage renal disease, hypertension was admitted on 12/06/2020 for  Principal Problem:   Chest pain Active Problems:   Anemia of chronic kidney failure, stage 5 (HCC)   ESRD on peritoneal dialysis (HCC)   HTN (hypertension)   Nicotine dependence   Intractable vomiting with nausea  Chest pain [R07.9] Intractable vomiting with nausea [R11.2]  #Chest pain Cardiac work-up is in progress   #. ESRD Outpatient PD regimen: 2000 cc fill volume x4 cycles If patient ends up getting admitted, will arrange for peritoneal dialysis today  #. Anemia of CKD  Lab Results  Component Value Date   HGB 9.1 (L) 12/06/2020  Epogen to be continued as outpatient per protocol   #. Secondary hyperparathyroidism of renal origin N 25.81   No results found for: PTH Lab Results  Component Value Date   PHOS 3.0 01/01/2015   Monitor calcium and phos level during this admission     LOS: 0 Larry [name] 3/17/20223:42 [place], [place]

## 2020-12-06 NOTE — Consult Note (Signed)
Cardiology Consultation:   Patient ID: Larry [name]; [id number]; 08/18/1966   Admit date: 12/06/2020 Date of Consult: 12/06/2020  Primary Care Provider: Gillis Santa, MD Primary Cardiologist: New to [facility] - consult by [facility] Primary Electrophysiologist:  None   Patient Profile:   Larry [name] is a 55 y.o. male with a hx of ESRD on PD since 2019, anemia of chronic disease, emphysema with ongoing tobacco use at at least 1 pack/day, and HTN who is being seen today for the evaluation of elevated troponin at the request of Dr. Damita [name].  History of Present Illness:   Larry [name] has no known cardiac history prior to this admission.  He was recently diagnosed with a URI with symptoms including fever, chills, myalgias, arthralgias, postnasal drip, sinus congestion, sore throat, and a nonproductive cough and was seen at a local urgent care for this, being treated with doxycycline.  Last evening, after starting his doxycycline he developed chest discomfort that began 3 hours prior to arrival and was worse with coughing, belching, and deep inspiration.  No exertional symptoms.  In the ED he was found to be afebrile and have stable vital signs.  Blood work was notable for white blood cell count of 14, hemoglobin 9.5, and an initial troponin of 129 with a delta troponin of 109.  BUN 139, serum creatinine 23.62.  EKG showed sinus tachycardia with LVH and no acute ST-T changes.  CTA of the chest showed no evidence of PE with significant coronary artery calcification as well as emphysema.  Chest x-ray nonacute.  Nitroglycerin did not help with his chest pain.  He did note symptomatic improvement with morphine and Dilaudid.  He has also received Tylenol and aspirin.  At time of cardiology consultation his main complaint is a sore throat and he does continue to have chest discomfort that again is worse with coughing, belching, and deep inspiration.  Past Medical History:  Diagnosis Date  . Anemia   .  ESRD on peritoneal dialysis ([place])   . Hypertension   . Nephrotic syndrome     Past Surgical History:  Procedure Laterality Date  . CAPD INSERTION N/A 01/06/2018   Procedure: LAPAROSCOPIC INSERTION CONTINUOUS AMBULATORY PERITONEAL DIALYSIS  (CAPD) CATHETER;  Surgeon: Larry Cabal, MD;  Location: [facility] ORS;  Service: Vascular;  Laterality: N/A;  . CHOLECYSTECTOMY    . CYSTOSCOPY W/ URETERAL STENT REMOVAL Right 10/23/2017   Procedure: CYSTOSCOPY WITH STENT REPLACEMENT;  Surgeon: Larry Sons, MD;  Location: [facility] ORS;  Service: Urology;  Laterality: Right;  . CYSTOSCOPY WITH STENT PLACEMENT Right 09/30/2017   Procedure: CYSTOSCOPY WITH STENT PLACEMENT;  Surgeon: Larry Sons, MD;  Location: [facility] ORS;  Service: Urology;  Laterality: Right;  . CYSTOSCOPY/RETROGRADE/URETEROSCOPY Right 10/23/2017   Procedure: CYSTOSCOPY/RETROGRADE/URETEROSCOPY;  Surgeon: Larry Sons, MD;  Location: [facility] ORS;  Service: Urology;  Laterality: Right;  . RENAL BIOPSY       Home Meds: Prior to Admission medications   Medication Sig Start Date End Date Taking? Authorizing Provider  calcium acetate (PHOSLO) 667 MG capsule Take 1,334 mg by mouth 3 (three) times daily. 12/03/20  Yes [provider]  doxycycline (VIBRAMYCIN) 100 MG capsule Take 100 mg by mouth 2 (two) times daily. 12/05/20  Yes [provider]  hydrALAZINE (APRESOLINE) 50 MG tablet Take 50 mg by mouth daily as needed (If diastolic bp is 90 or above).    Yes [provider]  ibuprofen (ADVIL,MOTRIN) 200 MG tablet Take 400 mg  by mouth daily as needed for headache or moderate pain.   Yes [provider]  losartan (COZAAR) 50 MG tablet Take 50 mg by mouth daily. 12/25/19  Yes [provider]  metoprolol succinate (TOPROL-XL) 100 MG 24 hr tablet Take 100 mg by mouth daily.   Yes [provider]  ondansetron (ZOFRAN-ODT) 4 MG disintegrating tablet Take 4 mg by mouth every 8 (eight) hours as needed.  12/05/20  Yes [provider]  oxybutynin (DITROPAN) 5 MG tablet Take 5 mg by mouth 2 (two) times daily. 01/18/18  Yes [provider]  sertraline (ZOLOFT) 25 MG tablet Take 25 mg by mouth daily.  09/25/17  Yes [provider]  sodium bicarbonate 650 MG tablet Take 1,300 mg by mouth 2 (two) times daily.    Yes [provider]  acetaminophen (TYLENOL) 500 MG tablet Take by mouth.    [provider]  famotidine (PEPCID) 20 MG tablet Take 1 tablet (20 mg total) by mouth 2 (two) times daily. Patient taking differently: Take 20 mg by mouth daily as needed for heartburn.  09/27/17 09/27/18  Larry Hong, MD  nicotine (NICODERM CQ - DOSED IN MG/24 HOURS) 14 mg/24hr patch Place 14 mg onto the skin daily.    [provider]    Inpatient Medications: Scheduled Meds: . [START ON 12/07/2020] aspirin EC  81 mg Oral Daily  . heparin  5,000 Units Subcutaneous Q8H  . hyoscyamine  0.25 mg Sublingual Once  . losartan  50 mg Oral Daily  . metoprolol succinate  100 mg Oral Daily  . nicotine  21 mg Transdermal Daily  . pantoprazole (PROTONIX) IV  40 mg Intravenous Q12H  . sertraline  25 mg Oral Daily   Continuous Infusions: . famotidine (PEPCID) IV     PRN Meds: acetaminophen, hydrALAZINE, menthol-cetylpyridinium, nitroGLYCERIN, ondansetron (ZOFRAN) IV  Allergies:   Allergies  Allergen Reactions  . Mushroom Extract Complex     Headache, migraines  . Amoxicillin Nausea And Vomiting    Weakness  Has patient had a PCN reaction causing immediate rash, facial/tongue/throat swelling, SOB or lightheadedness with hypotension: No Has patient had a PCN reaction causing severe rash involving mucus membranes or skin necrosis: No Has patient had a PCN reaction that required hospitalization: No Has patient had a PCN reaction occurring within the last 10 years: No If all of the above answers are "NO", then may proceed with Cephalosporin use.     Social History:    Social History   Socioeconomic History  . Marital status: Single    Spouse name: Not on file  . Number of children: Not on file  . Years of education: Not on file  . Highest education level: Not on file  Occupational History  . Not on file  Tobacco Use  . Smoking status: Heavy Tobacco Smoker    Packs/day: 1.00    Years: 20.00    Pack years: 20.00    Types: Cigarettes    Last attempt to quit: 08/22/2017    Years since quitting: 3.2  . Smokeless tobacco: Never Used  Vaping Use  . Vaping Use: Never used  Substance and Sexual Activity  . Alcohol use: Yes    Comment: occ. alcohol use  . Drug use: No  . Sexual activity: Not on file  Other Topics Concern  . Not on file  Social History Narrative  . Not on file   Social Determinants of Health   Financial Resource Strain: Not on file  Food Insecurity: Not on file  Transportation Needs: Not on file  Physical Activity: Not on file  Stress: Not on file  Social Connections: Not on file  Intimate Partner Violence: Not on file     Family History:   Family History  Problem Relation Age of Onset  . Diabetes Mother   . Kidney disease Mother        mother on dialysis  . CAD Mother   . Diabetes Sister   . CAD Sister     ROS:  Review of Systems  Constitutional: Positive for chills, fever and malaise/fatigue. Negative for diaphoresis and weight loss.  HENT: Positive for congestion, sinus pain and sore throat.   Eyes: Negative for discharge and redness.  Respiratory: Positive for cough, shortness of breath and wheezing. Negative for hemoptysis and sputum production.   Cardiovascular: Positive for chest pain. Negative for palpitations, orthopnea, claudication, leg swelling and PND.  Gastrointestinal: Positive for heartburn. Negative for abdominal pain, nausea and vomiting.  Musculoskeletal: Positive for myalgias. Negative for falls.       Arthralgias   Skin: Negative for rash.  Neurological: Positive for weakness. Negative for  dizziness, tingling, tremors, sensory change, speech change, focal weakness and loss of consciousness.  Endo/Heme/Allergies: Does not bruise/bleed easily.  Psychiatric/Behavioral: Negative for substance abuse. The patient is not nervous/anxious.   All other systems reviewed and are negative.     Physical Exam/Data:   Vitals:   12/06/20 0900 12/06/20 0930 12/06/20 1000 12/06/20 1030  BP:      Pulse: 81 79 82 87  Resp: '17 15 14 12  '$ Temp:      SpO2: 99% 100% 99% 98%  Weight:      Height:       No intake or output data in the 24 hours ending 12/06/20 1430 Filed Weights   12/06/20 0017  Weight: 68 kg   Body mass index is 24.21 kg/m.   Physical Exam: General: Well developed, well nourished, in no acute distress. Head: Normocephalic, atraumatic, sclera non-icteric, no xanthomas, nares without discharge.  Neck: Negative for carotid bruits. JVD not elevated. Lungs: Poor respiratory effort with coarse breath sounds bilaterally. Breathing is unlabored. Heart: RRR with S1 S2. II/VI systolic murmur LSB, no rubs, or gallops appreciated. Abdomen: Soft, non-tender, non-distended with normoactive bowel sounds. No hepatomegaly. No rebound/guarding. No obvious abdominal masses. Msk:  Strength and tone appear normal for age. Extremities: No clubbing or cyanosis. No edema. Distal pedal pulses are 2+ and equal bilaterally. Neuro: Alert and oriented X 3. No facial asymmetry. No focal deficit. Moves all extremities spontaneously. Psych:  Responds to questions appropriately with a normal affect.   EKG:  The EKG was personally reviewed and demonstrates: Sinus tachycardia, 102 bpm, LVH, no acute ST-T changes Telemetry:  Telemetry was personally reviewed and demonstrates: SR  Weights: [employer]   12/06/20 0017  Weight: 68 kg    Relevant CV Studies:  2D echo pending  Laboratory Data:  Chemistry Recent Labs  Lab 12/06/20 0019 12/06/20 0520  NA 134*  --   K 4.2  --   CL 105  --    CO2 9*  --   GLUCOSE 128*  --   BUN 139*  --   CREATININE 23.62* 22.99*  CALCIUM 7.0*  --   GFRNONAA 2* 2*  ANIONGAP 20*  --     Recent Labs  Lab 12/06/20 0019  PROT 6.0*  ALBUMIN 3.2*  AST 13*  ALT 11  ALKPHOS 59  BILITOT 0.8   Hematology Recent Labs  Lab 12/06/20 0019 12/06/20 0520  WBC 14.1* 16.0*  RBC 3.14* 3.03*  HGB 9.5* 9.1*  HCT 28.7* 28.3*  MCV 91.4 93.4  MCH 30.3 30.0  MCHC 33.1 32.2  RDW 16.0* 16.0*  PLT 242 239   Cardiac EnzymesNo results for input(s): TROPONINI in the last 168 hours. No results for input(s): TROPIPOC in the last 168 hours.  BNPNo results for input(s): BNP, PROBNP in the last 168 hours.  DDimer No results for input(s): DDIMER in the last 168 hours.  Radiology/Studies:  CT Angio Chest PE W and/or Wo Contrast  Result Date: 12/06/2020 IMPRESSION: 1. No evidence of pulmonary embolus. 2. Cardiomegaly, with prominent coronary artery atherosclerosis. 3.  Emphysema (ICD10-J43.9). Electronically Signed   By: Randa Ngo M.D.   On: 12/06/2020 03:57   DG Chest Port 1 View  Result Date: 12/06/2020 IMPRESSION: No active disease. Electronically Signed   By: Randa Ngo M.D.   On: 12/06/2020 01:42    Assessment and Plan:   1.  Elevated troponin with coronary artery calcification: -Minimally elevated and downtrending, not consistent with ACS -Symptoms are overall atypical though he does have significant risk factors including documented coronary artery calcification, ongoing tobacco use, male status of age 4 -No indication for heparin drip at this time -Await echo, if this demonstrates preserved LV systolic function could consider outpatient Lexiscan MPI to evaluate for high risk ischemia -If echo demonstrates a newly found cardiomyopathy or WMA suggestive of ischemia we would need to consider cardiac cath -Recommend aggressive risk factor modification including complete smoking cessation -Check A1c and lipid panel for further risk  stratification  2.  Cardiac murmur: -Echo pending  3.  ESRD: -Dialysis per nephrology  4.  HTN: -Blood pressure is reasonably controlled -Continue current medical therapy  5.  Anemia of chronic disease: -Stable  6.  URI: -Management per primary service -Will need outpatient follow-up with PCP  7.  Emphysema with ongoing tobacco use: -Complete cessation of tobacco is recommended -Nicotine patches   For questions or updates, please contact [place] Please consult [email or web address] for contact info under Cardiology/STEMI.   Signed, Christell Faith, PA-C [place] Pager: [phone] 12/06/2020, 2:30 PM

## 2020-12-07 ENCOUNTER — Inpatient Hospital Stay: Payer: Medicare Other

## 2020-12-07 ENCOUNTER — Encounter
Admission: EM | Disposition: A | Discharge status: Home / Self Care | Payer: Self-pay | Source: Home / Self Care | Attending: Internal Medicine

## 2020-12-07 DIAGNOSIS — Z992 Dependence on renal dialysis: Secondary | ICD-10-CM | POA: Diagnosis not present

## 2020-12-07 DIAGNOSIS — R1110 Vomiting, unspecified: Secondary | ICD-10-CM

## 2020-12-07 DIAGNOSIS — F17219 Nicotine dependence, cigarettes, with unspecified nicotine-induced disorders: Secondary | ICD-10-CM

## 2020-12-07 DIAGNOSIS — T82898A Other specified complication of vascular prosthetic devices, implants and grafts, initial encounter: Secondary | ICD-10-CM | POA: Diagnosis not present

## 2020-12-07 DIAGNOSIS — N186 End stage renal disease: Secondary | ICD-10-CM | POA: Diagnosis not present

## 2020-12-07 DIAGNOSIS — R079 Chest pain, unspecified: Secondary | ICD-10-CM | POA: Diagnosis not present

## 2020-12-07 DIAGNOSIS — R778 Other specified abnormalities of plasma proteins: Secondary | ICD-10-CM

## 2020-12-07 DIAGNOSIS — I309 Acute pericarditis, unspecified: Secondary | ICD-10-CM

## 2020-12-07 DIAGNOSIS — N185 Chronic kidney disease, stage 5: Secondary | ICD-10-CM

## 2020-12-07 HISTORY — PX: TEMPORARY DIALYSIS CATHETER: CATH118312

## 2020-12-07 LAB — HEPATITIS B SURFACE ANTIGEN: Hepatitis B Surface Ag: NONREACTIVE

## 2020-12-07 SURGERY — TEMPORARY DIALYSIS CATHETER
Anesthesia: LOCAL

## 2020-12-07 MED ORDER — CHLORHEXIDINE GLUCONATE CLOTH 2 % EX PADS
6.0000 | MEDICATED_PAD | Freq: Every day | CUTANEOUS | Status: DC
  Administered 2020-12-08 – 2020-12-09 (×2): 6 via TOPICAL

## 2020-12-07 MED ORDER — METHYLPREDNISOLONE 4 MG PO TBPK
8.0000 mg | ORAL_TABLET | Freq: Every evening | ORAL | Status: AC
  Administered 2020-12-07: 8 mg via ORAL

## 2020-12-07 MED ORDER — METHYLPREDNISOLONE 4 MG PO TBPK
4.0000 mg | ORAL_TABLET | Freq: Four times a day (QID) | ORAL | Status: DC
  Administered 2020-12-09: 4 mg via ORAL

## 2020-12-07 MED ORDER — METHYLPREDNISOLONE 4 MG PO TBPK
8.0000 mg | ORAL_TABLET | Freq: Every morning | ORAL | Status: AC
  Administered 2020-12-07: 8 mg via ORAL
  Filled 2020-12-07: qty 21

## 2020-12-07 MED ORDER — METHYLPREDNISOLONE 4 MG PO TBPK
4.0000 mg | ORAL_TABLET | ORAL | Status: AC
  Administered 2020-12-07: 4 mg via ORAL

## 2020-12-07 MED ORDER — ATORVASTATIN CALCIUM 20 MG PO TABS
40.0000 mg | ORAL_TABLET | Freq: Every day | ORAL | Status: DC
  Administered 2020-12-07 – 2020-12-09 (×3): 40 mg via ORAL
  Filled 2020-12-07 (×3): qty 2

## 2020-12-07 MED ORDER — METHYLPREDNISOLONE 4 MG PO TBPK: 4.0000 mg | ORAL_TABLET | ORAL | Status: AC

## 2020-12-07 MED ORDER — METHYLPREDNISOLONE 4 MG PO TBPK
8.0000 mg | ORAL_TABLET | Freq: Every evening | ORAL | Status: AC
  Administered 2020-12-08: 8 mg via ORAL

## 2020-12-07 MED ORDER — AMIODARONE HCL IN DEXTROSE 360-4.14 MG/200ML-% IV SOLN
INTRAVENOUS | Status: AC
  Filled 2020-12-07: qty 200

## 2020-12-07 MED ORDER — AMIODARONE LOAD VIA INFUSION
150.0000 mg | Freq: Once | INTRAVENOUS | Status: AC
  Administered 2020-12-07: 150 mg via INTRAVENOUS
  Filled 2020-12-07: qty 83.34

## 2020-12-07 MED ORDER — SODIUM CHLORIDE 0.9 % IV SOLN
25.0000 mg | Freq: Once | INTRAVENOUS | Status: AC
  Administered 2020-12-07: 25 mg via INTRAVENOUS
  Filled 2020-12-07: qty 1

## 2020-12-07 MED ORDER — LIDOCAINE HCL (PF) 1 % IJ SOLN
INTRAMUSCULAR | Status: DC | PRN
  Administered 2020-12-07: 30 mL

## 2020-12-07 MED ORDER — SODIUM CHLORIDE 0.9 % IV BOLUS
250.0000 mL | Freq: Once | INTRAVENOUS | Status: AC
  Administered 2020-12-07: 250 mL via INTRAVENOUS

## 2020-12-07 MED ORDER — METHYLPREDNISOLONE 4 MG PO TBPK
4.0000 mg | ORAL_TABLET | Freq: Three times a day (TID) | ORAL | Status: AC
  Administered 2020-12-08 (×2): 4 mg via ORAL
  Filled 2020-12-07: qty 21

## 2020-12-07 SURGICAL SUPPLY — 4 items
COVER PROBE U/S 5X48 (MISCELLANEOUS) ×1 IMPLANT
FORCEPS HALSTEAD CVD 5IN STRL (INSTRUMENTS) ×1 IMPLANT
KIT DIALYSIS CATH TRI 30X13 (CATHETERS) ×1 IMPLANT
PACK ANGIOGRAPHY (CUSTOM PROCEDURE TRAY) ×2 IMPLANT

## 2020-12-07 NOTE — Consult Note (Signed)
[name] Vascular Consult Note  MRN : [id number]  Larry [name] is a 55 y.o. ([birth year]) male who presents with chief complaint of  Chief Complaint  Patient presents with  . Chest Pain   History of Present Illness: Larry [name] is a 55 year old male with medical history significant for hypertension, ESRD on PD since 2019, anemia of CKD,nicotine dependence, as well as family history of CAD who presented to the ED with a complaint of chest pain and vomiting.    Patient was seen at an urgent care the day prior and was diagnosed with a respiratory tract infection after complaining of mostly sinus congestion, for which he was started on doxycycline. He now presents with complaint of anterior chest wall pain starting 3 hours prior to arrival. Pain is severe, nonradiating, worse with deep inspiration, associated shortness of breath as well as nonbloody, nonbilious, noncoffee-ground vomiting.  Unrelieved with TUMS at home.He has a nonproductive cough and no fever or chills.  Has no lower extremity pain or swelling. He denies abdominal pain or change in bowel habits.  Patient found to have pericarditis.   Vascular Surgery was consulted by Dr. Candiss Norse for peritoneal dialysis catheter removal.  Current Facility-Administered Medications  Medication Dose Route Frequency Provider Last Rate Last Admin  . acetaminophen (TYLENOL) tablet 650 mg  650 mg Oral Q4H PRN Athena Masse, MD      . atorvastatin (LIPITOR) tablet 40 mg  40 mg Oral Daily Christell Faith M, PA-C   40 mg at 12/07/20 1113  . calcium carbonate (TUMS - dosed in mg elemental calcium) chewable tablet 400 mg of elemental calcium  2 tablet Oral QID PRN Debbe Odea, MD   400 mg of elemental calcium at 12/06/20 1942  . [START ON 12/08/2020] Chlorhexidine Gluconate Cloth 2 % PADS 6 each  6 each Topical Q0600 Singh, Harmeet, MD      . chlorpheniramine-HYDROcodone (TUSSIONEX) 10-8 MG/5ML suspension 5 mL  5 mL Oral Q12H PRN  Lang Snow, NP   5 mL at 12/06/20 2229  . famotidine (PEPCID) IVPB 20 mg premix  20 mg Intravenous Q12H Rizwan, Eunice Blase, MD 100 mL/hr at 12/07/20 1110 20 mg at 12/07/20 1110  . heparin injection 5,000 Units  5,000 Units Subcutaneous Q8H Judd Gaudier V, MD   5,000 Units at 12/07/20 1515  . hydrALAZINE (APRESOLINE) tablet 50 mg  50 mg Oral Daily PRN Debbe Odea, MD      . losartan (COZAAR) tablet 50 mg  50 mg Oral Daily Debbe Odea, MD   50 mg at 12/07/20 1113  . menthol-cetylpyridinium (CEPACOL) lozenge 3 mg  1 lozenge Oral PRN Murlean Iba, MD      . methylPREDNISolone (MEDROL DOSEPAK) tablet 4 mg  4 mg Oral PC lunch Kathlyn Sacramento A, MD      . methylPREDNISolone (MEDROL DOSEPAK) tablet 4 mg  4 mg Oral PC supper Wellington Hampshire, MD      . Derrill Memo ON 12/08/2020] methylPREDNISolone (MEDROL DOSEPAK) tablet 4 mg  4 mg Oral 3 x daily with food Wellington Hampshire, MD      . Derrill Memo ON 12/09/2020] methylPREDNISolone (MEDROL DOSEPAK) tablet 4 mg  4 mg Oral 4X daily taper Kathlyn Sacramento A, MD      . methylPREDNISolone (MEDROL DOSEPAK) tablet 8 mg  8 mg Oral Nightly Wellington Hampshire, MD      . Derrill Memo ON 12/08/2020] methylPREDNISolone (MEDROL DOSEPAK) tablet 8 mg  8 mg Oral Nightly  Wellington Hampshire, MD      . metoprolol succinate (TOPROL-XL) 24 hr tablet 100 mg  100 mg Oral Daily Rizwan, Eunice Blase, MD   100 mg at 12/07/20 1113  . nicotine (NICODERM CQ - dosed in mg/24 hours) patch 21 mg  21 mg Transdermal Daily Athena Masse, MD   21 mg at 12/07/20 1111  . nitroGLYCERIN (NITROSTAT) SL tablet 0.4 mg  0.4 mg Sublingual Q5 min PRN Ward, Kristen N, DO   0.4 mg at 12/06/20 0158  . ondansetron (ZOFRAN) injection 4 mg  4 mg Intravenous Q6H PRN Athena Masse, MD   4 mg at 12/07/20 1105  . pantoprazole (PROTONIX) injection 40 mg  40 mg Intravenous Q12H Debbe Odea, MD   40 mg at 12/07/20 1104  . sertraline (ZOLOFT) tablet 25 mg  25 mg Oral Daily Debbe Odea, MD   25 mg at 12/07/20 1113  .  traMADol (ULTRAM) tablet 50 mg  50 mg Oral Q12H PRN Lang Snow, NP   50 mg at 12/06/20 2105   Facility-Administered Medications Ordered in Other Encounters  Medication Dose Route Frequency Provider Last Rate Last Admin  . 0.9 %  sodium chloride infusion   Intravenous Continuous [name], MD 10 mL/hr at 06/14/15 1412 New Bag at 06/14/15 1412   Past Medical History:  Diagnosis Date  . Anemia   . ESRD on peritoneal dialysis ([place])   . Hypertension   . Nephrotic syndrome    Past Surgical History:  Procedure Laterality Date  . CAPD INSERTION N/A 01/06/2018   Procedure: LAPAROSCOPIC INSERTION CONTINUOUS AMBULATORY PERITONEAL DIALYSIS  (CAPD) CATHETER;  Surgeon: Katha Cabal, MD;  Location: [facility] ORS;  Service: Vascular;  Laterality: N/A;  . CHOLECYSTECTOMY    . CYSTOSCOPY W/ URETERAL STENT REMOVAL Right 10/23/2017   Procedure: CYSTOSCOPY WITH STENT REPLACEMENT;  Surgeon: [name], MD;  Location: [facility] ORS;  Service: Urology;  Laterality: Right;  . CYSTOSCOPY WITH STENT PLACEMENT Right 09/30/2017   Procedure: CYSTOSCOPY WITH STENT PLACEMENT;  Surgeon: [name], MD;  Location: [facility] ORS;  Service: Urology;  Laterality: Right;  . CYSTOSCOPY/RETROGRADE/URETEROSCOPY Right 10/23/2017   Procedure: CYSTOSCOPY/RETROGRADE/URETEROSCOPY;  Surgeon: [name], MD;  Location: [facility] ORS;  Service: Urology;  Laterality: Right;  . RENAL BIOPSY     Social History Social History   Tobacco Use  . Smoking status: Heavy Tobacco Smoker    Packs/day: 1.00    Years: 20.00    Pack years: 20.00    Types: Cigarettes    Last attempt to quit: 08/22/2017    Years since quitting: 3.2  . Smokeless tobacco: Never Used  Vaping Use  . Vaping Use: Never used  Substance Use Topics  . Alcohol use: Yes    Comment: occ. alcohol use  . Drug use: No   Family History Family History  Problem Relation Age of Onset  . Diabetes Mother   . Kidney disease Mother        mother on  dialysis  . CAD Mother   . Diabetes Sister   . CAD Sister   Denies family history of peripheral artery disease, venous disease.  Patient's mother was on dialysis.  Allergies  Allergen Reactions  . Mushroom Extract Complex     Headache, migraines  . Amoxicillin Nausea And Vomiting    Weakness  Has patient had a PCN reaction causing immediate rash, facial/tongue/throat swelling, SOB or lightheadedness with hypotension: No Has patient had a PCN reaction causing  severe rash involving mucus membranes or skin necrosis: No Has patient had a PCN reaction that required hospitalization: No Has patient had a PCN reaction occurring within the last 10 years: No If all of the above answers are "NO", then may proceed with Cephalosporin use.    REVIEW OF SYSTEMS (Negative unless checked)  Constitutional: '[]'$ Weight loss  '[]'$ Fever  '[]'$ Chills Cardiac: '[x]'$ Chest pain   '[x]'$ Chest pressure   '[x]'$ Palpitations   '[x]'$ Shortness of breath when laying flat   '[x]'$ Shortness of breath at rest   '[x]'$ Shortness of breath with exertion. Vascular:  '[]'$ Pain in legs with walking   '[]'$ Pain in legs at rest   '[]'$ Pain in legs when laying flat   '[]'$ Claudication   '[]'$ Pain in feet when walking  '[]'$ Pain in feet at rest  '[]'$ Pain in feet when laying flat   '[]'$ History of DVT   '[]'$ Phlebitis   '[]'$ Swelling in legs   '[]'$ Varicose veins   '[]'$ Non-healing ulcers Pulmonary:   '[]'$ Uses home oxygen   '[]'$ Productive cough   '[]'$ Hemoptysis   '[]'$ Wheeze  '[]'$ COPD   '[]'$ Asthma Neurologic:  '[]'$ Dizziness  '[]'$ Blackouts   '[]'$ Seizures   '[]'$ History of stroke   '[]'$ History of TIA  '[]'$ Aphasia   '[]'$ Temporary blindness   '[]'$ Dysphagia   '[]'$ Weakness or numbness in arms   '[]'$ Weakness or numbness in legs Musculoskeletal:  '[]'$ Arthritis   '[]'$ Joint swelling   '[]'$ Joint pain   '[]'$ Low back pain Hematologic:  '[]'$ Easy bruising  '[]'$ Easy bleeding   '[]'$ Hypercoagulable state   '[]'$ Anemic  '[]'$ Hepatitis Gastrointestinal:  '[]'$ Blood in stool   '[]'$ Vomiting blood  '[]'$ Gastroesophageal reflux/heartburn   '[]'$ Difficulty  swallowing. Genitourinary:  '[x]'$ Chronic kidney disease   '[]'$ Difficult urination  '[]'$ Frequent urination  '[]'$ Burning with urination   '[]'$ Blood in urine Skin:  '[]'$ Rashes   '[]'$ Ulcers   '[]'$ Wounds Psychological:  '[]'$ History of anxiety   '[]'$  History of major depression.  Physical Examination  Vitals:   12/07/20 0729 12/07/20 1131 12/07/20 1329 12/07/20 1410  BP: (!) 105/55 (!) 106/58 (!) 109/59 110/61  Pulse: 79 79 84 84  Resp: '19 19 17 15  '$ Temp: 98.1 F (36.7 C) 98.2 F (36.8 C) 98.2 F (36.8 C)   TempSrc: Oral Oral Oral   SpO2: 99% 99% 98% 98%  Weight:      Height:       Body mass index is 24.34 kg/m. Gen:  WD/WN, NAD Head: [place]/AT, No temporalis wasting. Prominent temp pulse not noted. Ear/Nose/Throat: Hearing grossly intact, nares w/o erythema or drainage, oropharynx w/o Erythema/Exudate Eyes: Sclera non-icteric, conjunctiva clear Neck: Trachea midline.  No JVD.  Pulmonary:  Good air movement, respirations not labored, equal bilaterally.  Cardiac: RRR, normal S1, S2. Vascular:  Vessel Right Left  Radial Palpable Palpable  Ulnar Palpable Palpable                               Gastrointestinal: soft, non-tender/non-distended. No guarding/reflex.  Musculoskeletal: M/S 5/5 throughout.  Extremities without ischemic changes.  No deformity or atrophy. No edema. Neurologic: Sensation grossly intact in extremities.  Symmetrical.  Speech is fluent. Motor exam as listed above. Psychiatric: Judgment intact, Mood & affect appropriate for pt's clinical situation. Dermatologic: No rashes or ulcers noted.  No cellulitis or open wounds. Lymph : No Cervical, Axillary, or Inguinal lymphadenopathy.  CBC Lab Results  Component Value Date   WBC 16.0 (H) 12/06/2020   HGB 9.1 (L) 12/06/2020   HCT 28.3 (L) 12/06/2020   MCV 93.4 12/06/2020   PLT 239 12/06/2020  BMET    Component Value Date/Time   NA 134 (L) 12/06/2020 0019   NA 138 01/19/2015 0545   K 4.2 12/06/2020 0019   K 3.9 01/19/2015  0545   CL 105 12/06/2020 0019   CL 114 (H) 01/19/2015 0545   CO2 9 (L) 12/06/2020 0019   CO2 18 (L) 01/19/2015 0545   GLUCOSE 128 (H) 12/06/2020 0019   GLUCOSE 95 01/19/2015 0545   BUN 139 (H) 12/06/2020 0019   BUN 48 (H) 01/19/2015 0545   CREATININE 22.99 (H) 12/06/2020 0520   CREATININE 5.66 (H) 01/19/2015 0545   CALCIUM 7.0 (L) 12/06/2020 0019   CALCIUM 8.0 (L) 01/19/2015 0545   GFRNONAA 2 (L) 12/06/2020 0520   GFRNONAA 11 (L) 01/19/2015 0545   GFRAA 21 (L) 12/14/2018 1430   GFRAA 13 (L) 01/19/2015 0545   Estimated Creatinine Clearance: 3.3 mL/min (A) (by C-G formula based on SCr of 22.99 mg/dL (H)).  COAG Lab Results  Component Value Date   INR 1.1 10/03/2020   INR 0.86 12/31/2017   Radiology CT Angio Chest PE W and/or Wo Contrast  Result Date: 12/06/2020 CLINICAL DATA:  Chest pain, a respiratory infection, emesis EXAM: CT ANGIOGRAPHY CHEST WITH CONTRAST TECHNIQUE: Multidetector CT imaging of the chest was performed using the standard protocol during bolus administration of intravenous contrast. Multiplanar CT image reconstructions and MIPs were obtained to evaluate the vascular anatomy. CONTRAST:  36m OMNIPAQUE IOHEXOL 350 MG/ML SOLN COMPARISON:  12/06/2020, 03/07/2015 FINDINGS: Cardiovascular: This is a technically adequate evaluation of the pulmonary vasculature. No filling defects or pulmonary emboli. The heart is enlarged without pericardial effusion. Extensive atherosclerosis within the coronary vasculature greatest in the LAD and circumflex distributions. No evidence of thoracic aortic aneurysm or dissection. Mediastinum/Nodes: No enlarged mediastinal, hilar, or axillary lymph nodes. Thyroid gland, trachea, and esophagus demonstrate no significant findings. Lungs/Pleura: Mild emphysematous changes at the apices. Hypoventilatory changes at the lung bases. No airspace disease, effusion, or pneumothorax. The central airways are patent. Upper Abdomen: No acute abnormality.  Musculoskeletal: No acute or destructive bony lesions. Reconstructed images demonstrate no additional findings. Review of the MIP images confirms the above findings. IMPRESSION: 1. No evidence of pulmonary embolus. 2. Cardiomegaly, with prominent coronary artery atherosclerosis. 3.  Emphysema (ICD10-J43.9). Electronically Signed   By: MRanda NgoM.D.   On: 12/06/2020 03:57   DG Chest Port 1 View  Result Date: 12/06/2020 CLINICAL DATA:  Chest pain, upper respiratory infection EXAM: PORTABLE CHEST 1 VIEW COMPARISON:  12/14/2018 FINDINGS: The heart size and mediastinal contours are within normal limits. Both lungs are clear. The visualized skeletal structures are unremarkable. IMPRESSION: No active disease. Electronically Signed   By: MRanda NgoM.D.   On: 12/06/2020 01:42   ECHOCARDIOGRAM COMPLETE  Result Date: 12/06/2020    ECHOCARDIOGRAM REPORT   Patient Name:   BWYLER SOUTHWOODDate of Exam: 12/06/2020 Medical Rec #:  [id number]      Height:       66.0 in Accession #:    [id number]     Weight:       150.0 lb Date of Birth:  305-Sep-1967       BSA:          1.770 m Patient Age:    89years        BP:           150/90 mmHg Patient Gender: M  HR:           86 bpm. Exam Location:  [facility] Procedure: 2D Echo, Color Doppler, Cardiac Doppler and Strain Analysis Indications:     Chest pain R07.9  History:         Patient has no prior history of Echocardiogram examinations.                  Risk Factors:Hypertension. CKD.  Sonographer:     Sherrie Sport RDCS (AE) Referring Phys:  [id number] Athena Masse Diagnosing Phys: Ida Rogue MD  Sonographer Comments: Global longitudinal strain was attempted. IMPRESSIONS  1. Left ventricular ejection fraction, by estimation, is 55 to 60%. The left ventricle has normal function. The left ventricle has no regional wall motion abnormalities. There is moderate left ventricular hypertrophy. Left ventricular diastolic parameters are consistent with Grade II diastolic  dysfunction (pseudonormalization). The average left ventricular global longitudinal strain is -9.4 %. The global longitudinal strain is abnormal.  2. Right ventricular systolic function is normal. The right ventricular size is normal. FINDINGS  Left Ventricle: Left ventricular ejection fraction, by estimation, is 55 to 60%. The left ventricle has normal function. The left ventricle has no regional wall motion abnormalities. The average left ventricular global longitudinal strain is -9.4 %. The  global longitudinal strain is abnormal. The left ventricular internal cavity size was normal in size. There is moderate left ventricular hypertrophy. Left ventricular diastolic parameters are consistent with Grade II diastolic dysfunction (pseudonormalization). Right Ventricle: The right ventricular size is normal. No increase in right ventricular wall thickness. Right ventricular systolic function is normal. Left Atrium: Left atrial size was normal in size. Right Atrium: Right atrial size was normal in size. Pericardium: There is no evidence of pericardial effusion. Mitral Valve: The mitral valve is normal in structure. No evidence of mitral valve regurgitation. No evidence of mitral valve stenosis. Tricuspid Valve: The tricuspid valve is normal in structure. Tricuspid valve regurgitation is mild . No evidence of tricuspid stenosis. Aortic Valve: The aortic valve was not well visualized. Aortic valve regurgitation is not visualized. No aortic stenosis is present. Aortic valve mean gradient measures 6.0 mmHg. Aortic valve peak gradient measures 11.7 mmHg. Aortic valve area, by VTI measures 3.30 cm. Pulmonic Valve: The pulmonic valve was normal in structure. Pulmonic valve regurgitation is not visualized. No evidence of pulmonic stenosis. Aorta: The aortic root is normal in size and structure. Venous: The inferior vena cava is normal in size with greater than 50% respiratory variability, suggesting right atrial pressure of 3  mmHg. IAS/Shunts: No atrial level shunt detected by color flow Doppler.  LEFT VENTRICLE PLAX 2D LVIDd:         4.35 cm  Diastology LVIDs:         2.43 cm  LV e' medial:    6.20 cm/s LV PW:         1.45 cm  LV E/e' medial:  14.9 LV IVS:        1.08 cm  LV e' lateral:   6.64 cm/s LVOT diam:     2.00 cm  LV E/e' lateral: 13.9 LV SV:         78 LV SV Index:   44       2D Longitudinal Strain LVOT Area:     3.14 cm 2D Strain GLS Avg:     -9.4 %  3D Volume EF:                         3D EF:        53 %                         LV EDV:       193 ml                         LV ESV:       91 ml                         LV SV:        102 ml RIGHT VENTRICLE RV Basal diam:  3.96 cm RV S prime:     20.20 cm/s TAPSE (M-mode): 4.0 cm LEFT ATRIUM             Index       RIGHT ATRIUM           Index LA diam:        3.80 cm 2.15 cm/m  RA Area:     17.80 cm LA Vol (A2C):   98.9 ml 55.89 ml/m RA Volume:   53.60 ml  30.29 ml/m LA Vol (A4C):   92.5 ml 52.27 ml/m LA Biplane Vol: 97.1 ml 54.87 ml/m  AORTIC VALVE                    PULMONIC VALVE AV Area (Vmax):    3.10 cm     PV Vmax:        1.10 m/s AV Area (Vmean):   2.97 cm     PV Peak grad:   4.8 mmHg AV Area (VTI):     3.30 cm     RVOT Peak grad: 5 mmHg AV Vmax:           171.00 cm/s AV Vmean:          113.000 cm/s AV VTI:            0.235 m AV Peak Grad:      11.7 mmHg AV Mean Grad:      6.0 mmHg LVOT Vmax:         169.00 cm/s LVOT Vmean:        107.000 cm/s LVOT VTI:          0.247 m LVOT/AV VTI ratio: 1.05  AORTA Ao Root diam: 3.20 cm MITRAL VALVE               TRICUSPID VALVE MV Area (PHT): 3.40 cm    TR Peak grad:   18.7 mmHg MV Decel Time: 223 msec    TR Vmax:        216.00 cm/s MV E velocity: 92.10 cm/s MV A velocity: 71.60 cm/s  SHUNTS MV E/A ratio:  1.29        Systemic VTI:  0.25 m                            Systemic Diam: 2.00 cm Ida Rogue MD Electronically signed by Ida Rogue MD Signature Date/Time: 12/06/2020/3:31:49 PM    Final     Assessment/Plan Damacio Claverie is a 55 year old male with medical history significant for hypertension, ESRD on PD since 2019, anemia of CKD,nicotine  dependence, as well as family history of CAD who presented to the [facility] emergency department and found to have pericarditis  1.  End-stage Renal Disease in the setting of Pericarditis: Patient with known history of end-stage renal disease currently maintained via peritoneal dialysis. Patient recently diagnosed with acute pericarditis which could possibly be due to to patient's uremia. Unsure how compliant the patient is regarding his peritoneal dialysis. At this time, in an attempt to allow for more aggressive dialysis and as per the recommendation of nephrology will place a temporary dialysis catheter. Procedure, risks and benefits were explained to the patient. All questions were answered. The patient wished to proceed.  2.  Hypertension: On appropriate medications Encouraged good control as its slows the progression of atherosclerotic disease  3.  Tobacco abuse: We had a discussion for approximately three minutes regarding the absolute need for smoking cessation due to the deleterious nature of tobacco on the vascular system. We discussed the tobacco use would diminish patency of any intervention, and likely significantly worsen progression of disease. We discussed multiple agents for quitting including replacement therapy or medications to reduce cravings such as Chantix. The patient voices their understanding of the importance of smoking cessation.  Discussed with Dr. Francene Castle, PA-C  12/07/2020 3:39 PM  This note was created with Dragon medical transcription system.  Any error is purely unintentional.

## 2020-12-07 NOTE — Plan of Care (Signed)

## 2020-12-07 NOTE — Progress Notes (Signed)
Progress Note  Patient Name: Larry [name] Date of Encounter: 12/07/2020  Primary Cardiologist: New to [facility] - consult by Gollan  Subjective   Still notes nasal congestion, sore throat and chest discomfort that is associated with coughing and deep inspiration. Echo showed a preserved LVSF with normal wall motion.   Inpatient Medications    Scheduled Meds: . heparin  5,000 Units Subcutaneous Q8H  . losartan  50 mg Oral Daily  . metoprolol succinate  100 mg Oral Daily  . nicotine  21 mg Transdermal Daily  . pantoprazole (PROTONIX) IV  40 mg Intravenous Q12H  . sertraline  25 mg Oral Daily   Continuous Infusions: . famotidine (PEPCID) IV 20 mg (12/06/20 2041)   PRN Meds: acetaminophen, calcium carbonate, chlorpheniramine-HYDROcodone, hydrALAZINE, menthol-cetylpyridinium, nitroGLYCERIN, ondansetron (ZOFRAN) IV, traMADol   Vital Signs    Vitals:   12/06/20 1750 12/06/20 1926 12/07/20 0617 12/07/20 0729  BP: 129/65 131/69 109/61 (!) 105/55  Pulse: 87 91 83 79  Resp: 18 (!) '23 20 19  '$ Temp: 98.5 F (36.9 C) 97.7 F (36.5 C) 98.5 F (36.9 C) 98.1 F (36.7 C)  TempSrc: Oral Oral Oral Oral  SpO2: 98%  98% 99%  Weight:   68.4 kg   Height:       No intake or output data in the 24 hours ending 12/07/20 0735 Filed Weights   12/06/20 0017 12/07/20 0617  Weight: 68 kg 68.4 kg    Telemetry    SR - Personally Reviewed  ECG    No new tracings - Personally Reviewed  Physical Exam   GEN: No acute distress.   Neck: No JVD. Cardiac: RRR, II/VI systolic murmur, no rubs, or gallops.  Respiratory: Diminished breath sounds bilaterally with expiratory wheezing.  GI: Soft, nontender, non-distended.   MS: No edema; No deformity. Neuro:  Alert and oriented x 3; Nonfocal.  Psych: Normal affect.  Labs    Chemistry Recent Labs  Lab 12/06/20 0019 12/06/20 0520  NA 134*  --   K 4.2  --   CL 105  --   CO2 9*  --   GLUCOSE 128*  --   BUN 139*  --   CREATININE 23.62*  22.99*  CALCIUM 7.0*  --   PROT 6.0*  --   ALBUMIN 3.2*  --   AST 13*  --   ALT 11  --   ALKPHOS 59  --   BILITOT 0.8  --   GFRNONAA 2* 2*  ANIONGAP 20*  --      Hematology Recent Labs  Lab 12/06/20 0019 12/06/20 0520  WBC 14.1* 16.0*  RBC 3.14* 3.03*  HGB 9.5* 9.1*  HCT 28.7* 28.3*  MCV 91.4 93.4  MCH 30.3 30.0  MCHC 33.1 32.2  RDW 16.0* 16.0*  PLT 242 239    Cardiac EnzymesNo results for input(s): TROPONINI in the last 168 hours. No results for input(s): TROPIPOC in the last 168 hours.   BNPNo results for input(s): BNP, PROBNP in the last 168 hours.   DDimer No results for input(s): DDIMER in the last 168 hours.   Radiology    CT Angio Chest PE W and/or Wo Contrast  Result Date: 12/06/2020 IMPRESSION: 1. No evidence of pulmonary embolus. 2. Cardiomegaly, with prominent coronary artery atherosclerosis. 3.  Emphysema (ICD10-J43.9). Electronically Signed   By: Randa Ngo M.D.   On: 12/06/2020 03:57   DG Chest Port 1 View  Result Date: 12/06/2020 IMPRESSION: No active disease. Electronically Signed  By: Randa Ngo M.D.   On: 12/06/2020 01:42   Cardiac Studies   2D echo 12/06/2020: 1. Left ventricular ejection fraction, by estimation, is 55 to 60%. The  left ventricle has normal function. The left ventricle has no regional  wall motion abnormalities. There is moderate left ventricular hypertrophy.  Left ventricular diastolic  parameters are consistent with Grade II diastolic dysfunction  (pseudonormalization). The average left ventricular global longitudinal  strain is -9.4 %. The global longitudinal strain is abnormal.  2. Right ventricular systolic function is normal. The right ventricular  size is normal.   Patient Profile     55 y.o. male with history of ESRD on PD since 2019, anemia of chronic disease, emphysema with ongoing tobacco use at at least 1 pack/day, and HTN who is being seen today for the evaluation of elevated troponin at the request  of Dr. Damita Dunnings.  Assessment & Plan    1. Elevated troponin with coronary artery calcification: -Minimally elevated and downtrending, not consistent with ACS -Symptoms are overall atypical though he does have significant risk factors including documented coronary artery calcification, ongoing tobacco use, male status of age 72 -No indication for heparin drip at this time -Reproducible to palpation  -Echo with preserved LVSF and normal wall motion -Plan for outpatient Lexiscan MPI to evaluate for high risk ischemia once his acute illness is improved -Recommend aggressive risk factor modification including complete smoking cessation -A1c 6.0  2.  ESRD: -Dialysis per nephrology  3.  HTN: -Blood pressure is reasonably controlled -Continue current medical therapy  4.  Anemia of chronic disease: -Stable  5.  URI: -Management per primary service -Will need outpatient follow-up with PCP  6.  Emphysema with ongoing tobacco use: -Complete cessation of tobacco is recommended -Nicotine patches  7. HLD: -LDL 82 this admission with goal being < 70 with noted coronary artery calcium on imaging  -Add Lipitor 40 mg daily -Follow up fasting lipid panel an ALT in ~ 8 weeks   For questions or updates, please contact [facility] Please consult [email or web address] for contact info under Cardiology/STEMI.    Signed, Christell Faith, PA-C [place] Pager: [phone] 12/07/2020, 7:35 AM

## 2020-12-07 NOTE — Op Note (Signed)
  OPERATIVE NOTE   PROCEDURE: 1. Insertion of temporary dialysis catheter catheter right femoral approach.  PRE-OPERATIVE DIAGNOSIS: End-stage renal disease on peritoneal hemodialysis; complication of dialysis with inability to obtain clearance using PD  POST-OPERATIVE DIAGNOSIS: Same  SURGEON: Katha Cabal M.D.  ANESTHESIA: 1% lidocaine local infiltration  ESTIMATED BLOOD LOSS: Minimal cc  INDICATIONS:   Larry [name] is a 55 y.o. male who presents with inefficient peritoneal dialysis requiring transition to hemodialysis for a short course.  Risk and benefits for catheter insertion were reviewed all questions were answered patient agrees to proceed.  DESCRIPTION: After obtaining full informed written consent, the patient was positioned supine. The right groin was prepped and draped in a sterile fashion. Ultrasound was placed in a sterile sleeve. Ultrasound was utilized to identify the right common femoral vein which is noted to be echolucent and compressible indicating patency. Images recorded for the permanent record. Under real-time visualization a Seldinger needle is inserted into the vein and the guidewires advanced without difficulty. Small counterincision was made at the wire insertion site. Dilator is passed over the wire and the temporary dialysis catheter catheter is fed over the wire without difficulty.  All lumens aspirate and flush easily and are packed with heparin saline. Catheter secured to the skin of the right thigh with 2-0 silk. A sterile dressing is applied with Biopatch.  COMPLICATIONS: None  CONDITION: Unchanged  Hortencia Pilar Office:  [phone] 12/07/2020, 6:26 PM

## 2020-12-07 NOTE — Progress Notes (Signed)
[facility], [place] 12/07/20  Subjective:   LOS: 1   Patient known to our practice from outpatient dialysis. He is followed by Dr. Holley Raring for peritoneal dialysis as outpatient He presents for midepigastric chest pain and nausea and vomiting Patient reports he was nauseous earlier in the morning.  Eating very little.  Wife at bedside.  Diagnosed with acute pericarditis.  May be getting suboptimal PD.   Objective:  Vital signs in last 24 hours:  Temp:  [97.7 F (36.5 C)-98.5 F (36.9 C)] 98.2 F (36.8 C) (03/18 1131) Pulse Rate:  [79-93] 79 (03/18 1131) Resp:  [18-25] 19 (03/18 1131) BP: (105-131)/(55-69) 106/58 (03/18 1131) SpO2:  [98 %-99 %] 99 % (03/18 1131) Weight:  [68.4 kg] 68.4 kg (03/18 0617)  Weight change: 0.36 kg Filed Weights   12/06/20 0017 12/07/20 0617  Weight: 68 kg 68.4 kg    Intake/Output:   No intake or output data in the 24 hours ending 12/07/20 1247  Physical Exam: General:  No acute distress, laying in the bed  HEENT  anicteric, moist oral mucous membrane  Pulm/lungs  normal breathing effort, lungs are clear to auscultation  CVS/Heart  regular rhythm, ?  Pericardial rub  Abdomen:   Soft, nontender  Extremities:  No peripheral edema  Neurologic:  Alert, oriented, able to follow commands  Skin:  No acute rashes    Basic Metabolic Panel:  Recent Labs  Lab 12/06/20 0019 12/06/20 0520  NA 134*  --   K 4.2  --   CL 105  --   CO2 9*  --   GLUCOSE 128*  --   BUN 139*  --   CREATININE 23.62* 22.99*  CALCIUM 7.0*  --      CBC: Recent Labs  Lab 12/06/20 0019 12/06/20 0520  WBC 14.1* 16.0*  NEUTROABS 10.7*  --   HGB 9.5* 9.1*  HCT 28.7* 28.3*  MCV 91.4 93.4  PLT 242 239     No results found for: HEPBSAG, HEPBSAB, HEPBIGM    Microbiology:  Recent Results (from the past 240 hour(s))  Resp Panel by RT-PCR (Flu A&B, Covid) Nasopharyngeal Swab     Status: None   Collection Time: 12/06/20  1:49 AM    Specimen: Nasopharyngeal Swab; Nasopharyngeal(NP) swabs in vial transport medium  Result Value Ref Range Status   SARS Coronavirus 2 by RT PCR NEGATIVE NEGATIVE Final    Comment: (NOTE) SARS-CoV-2 target nucleic acids are NOT DETECTED.  The SARS-CoV-2 RNA is generally detectable in upper respiratory specimens during the acute phase of infection. The lowest concentration of SARS-CoV-2 viral copies this assay can detect is 138 copies/mL. A negative result does not preclude SARS-Cov-2 infection and should not be used as the sole basis for treatment or other patient management decisions. A negative result may occur with  improper specimen collection/handling, submission of specimen other than nasopharyngeal swab, presence of viral mutation(s) within the areas targeted by this assay, and inadequate number of viral copies(<138 copies/mL). A negative result must be combined with clinical observations, patient history, and epidemiological information. The expected result is Negative.  Fact Sheet for Patients:  EntrepreneurPulse.com.au  Fact Sheet for Healthcare Providers:  IncredibleEmployment.be  This test is no t yet approved or cleared by the [place] FDA and  has been authorized for detection and/or diagnosis of SARS-CoV-2 by FDA under an Emergency Use Authorization (EUA). This EUA will remain  in effect (meaning this test can be used) for the duration of  the COVID-19 declaration under Section 564(b)(1) of the Act, 21 U.S.C.section 360bbb-3(b)(1), unless the authorization is terminated  or revoked sooner.       Influenza A by PCR NEGATIVE NEGATIVE Final   Influenza B by PCR NEGATIVE NEGATIVE Final    Comment: (NOTE) The Xpert Xpress SARS-CoV-2/FLU/RSV plus assay is intended as an aid in the diagnosis of influenza from Nasopharyngeal swab specimens and should not be used as a sole basis for treatment. Nasal washings and aspirates are  unacceptable for Xpert Xpress SARS-CoV-2/FLU/RSV testing.  Fact Sheet for Patients: EntrepreneurPulse.com.au  Fact Sheet for Healthcare Providers: IncredibleEmployment.be  This test is not yet approved or cleared by the [place] FDA and has been authorized for detection and/or diagnosis of SARS-CoV-2 by FDA under an Emergency Use Authorization (EUA). This EUA will remain in effect (meaning this test can be used) for the duration of the COVID-19 declaration under Section 564(b)(1) of the Act, 21 U.S.C. section 360bbb-3(b)(1), unless the authorization is terminated or revoked.  Performed at [facility], [address] [place], [place] [address]     Coagulation Studies: No results for input(s): LABPROT, INR in the last 72 hours.  Urinalysis: No results for input(s): COLORURINE, LABSPEC, PHURINE, GLUCOSEU, HGBUR, BILIRUBINUR, KETONESUR, PROTEINUR, UROBILINOGEN, NITRITE, LEUKOCYTESUR in the last 72 hours.  Invalid input(s): APPERANCEUR    Imaging: CT Angio Chest PE W and/or Wo Contrast  Result Date: 12/06/2020 CLINICAL DATA:  Chest pain, a respiratory infection, emesis EXAM: CT ANGIOGRAPHY CHEST WITH CONTRAST TECHNIQUE: Multidetector CT imaging of the chest was performed using the standard protocol during bolus administration of intravenous contrast. Multiplanar CT image reconstructions and MIPs were obtained to evaluate the vascular anatomy. CONTRAST:  101m OMNIPAQUE IOHEXOL 350 MG/ML SOLN COMPARISON:  12/06/2020, 03/07/2015 FINDINGS: Cardiovascular: This is a technically adequate evaluation of the pulmonary vasculature. No filling defects or pulmonary emboli. The heart is enlarged without pericardial effusion. Extensive atherosclerosis within the coronary vasculature greatest in the LAD and circumflex distributions. No evidence of thoracic aortic aneurysm or dissection. Mediastinum/Nodes: No enlarged mediastinal, hilar, or axillary  lymph nodes. Thyroid gland, trachea, and esophagus demonstrate no significant findings. Lungs/Pleura: Mild emphysematous changes at the apices. Hypoventilatory changes at the lung bases. No airspace disease, effusion, or pneumothorax. The central airways are patent. Upper Abdomen: No acute abnormality. Musculoskeletal: No acute or destructive bony lesions. Reconstructed images demonstrate no additional findings. Review of the MIP images confirms the above findings. IMPRESSION: 1. No evidence of pulmonary embolus. 2. Cardiomegaly, with prominent coronary artery atherosclerosis. 3.  Emphysema (ICD10-J43.9). Electronically Signed   By: MRanda NgoM.D.   On: 12/06/2020 03:57   DG Chest Port 1 View  Result Date: 12/06/2020 CLINICAL DATA:  Chest pain, upper respiratory infection EXAM: PORTABLE CHEST 1 VIEW COMPARISON:  12/14/2018 FINDINGS: The heart size and mediastinal contours are within normal limits. Both lungs are clear. The visualized skeletal structures are unremarkable. IMPRESSION: No active disease. Electronically Signed   By: MRanda NgoM.D.   On: 12/06/2020 01:42   ECHOCARDIOGRAM COMPLETE  Result Date: 12/06/2020    ECHOCARDIOGRAM REPORT   Patient Name:   BDEMETRIES ARNAUDate of Exam: 12/06/2020 Medical Rec #:  [id number]      Height:       66.0 in Accession #:    [id number]     Weight:       150.0 lb Date of Birth:  [birth year]       BSA:  1.770 m Patient Age:    59 years        BP:           150/90 mmHg Patient Gender: M               HR:           86 bpm. Exam Location:  [facility] Procedure: 2D Echo, Color Doppler, Cardiac Doppler and Strain Analysis Indications:     Chest pain R07.9  History:         Patient has no prior history of Echocardiogram examinations.                  Risk Factors:Hypertension. CKD.  Sonographer:     Sherrie Sport RDCS (AE) Referring Phys:  [id number] Athena Masse Diagnosing Phys: Ida Rogue MD  Sonographer Comments: Global longitudinal strain was attempted.  IMPRESSIONS  1. Left ventricular ejection fraction, by estimation, is 55 to 60%. The left ventricle has normal function. The left ventricle has no regional wall motion abnormalities. There is moderate left ventricular hypertrophy. Left ventricular diastolic parameters are consistent with Grade II diastolic dysfunction (pseudonormalization). The average left ventricular global longitudinal strain is -9.4 %. The global longitudinal strain is abnormal.  2. Right ventricular systolic function is normal. The right ventricular size is normal. FINDINGS  Left Ventricle: Left ventricular ejection fraction, by estimation, is 55 to 60%. The left ventricle has normal function. The left ventricle has no regional wall motion abnormalities. The average left ventricular global longitudinal strain is -9.4 %. The  global longitudinal strain is abnormal. The left ventricular internal cavity size was normal in size. There is moderate left ventricular hypertrophy. Left ventricular diastolic parameters are consistent with Grade II diastolic dysfunction (pseudonormalization). Right Ventricle: The right ventricular size is normal. No increase in right ventricular wall thickness. Right ventricular systolic function is normal. Left Atrium: Left atrial size was normal in size. Right Atrium: Right atrial size was normal in size. Pericardium: There is no evidence of pericardial effusion. Mitral Valve: The mitral valve is normal in structure. No evidence of mitral valve regurgitation. No evidence of mitral valve stenosis. Tricuspid Valve: The tricuspid valve is normal in structure. Tricuspid valve regurgitation is mild . No evidence of tricuspid stenosis. Aortic Valve: The aortic valve was not well visualized. Aortic valve regurgitation is not visualized. No aortic stenosis is present. Aortic valve mean gradient measures 6.0 mmHg. Aortic valve peak gradient measures 11.7 mmHg. Aortic valve area, by VTI measures 3.30 cm. Pulmonic Valve: The  pulmonic valve was normal in structure. Pulmonic valve regurgitation is not visualized. No evidence of pulmonic stenosis. Aorta: The aortic root is normal in size and structure. Venous: The inferior vena cava is normal in size with greater than 50% respiratory variability, suggesting right atrial pressure of 3 mmHg. IAS/Shunts: No atrial level shunt detected by color flow Doppler.  LEFT VENTRICLE PLAX 2D LVIDd:         4.35 cm  Diastology LVIDs:         2.43 cm  LV e' medial:    6.20 cm/s LV PW:         1.45 cm  LV E/e' medial:  14.9 LV IVS:        1.08 cm  LV e' lateral:   6.64 cm/s LVOT diam:     2.00 cm  LV E/e' lateral: 13.9 LV SV:         78 LV SV Index:   44  2D Longitudinal Strain LVOT Area:     3.14 cm 2D Strain GLS Avg:     -9.4 %                          3D Volume EF:                         3D EF:        53 %                         LV EDV:       193 ml                         LV ESV:       91 ml                         LV SV:        102 ml RIGHT VENTRICLE RV Basal diam:  3.96 cm RV S prime:     20.20 cm/s TAPSE (M-mode): 4.0 cm LEFT ATRIUM             Index       RIGHT ATRIUM           Index LA diam:        3.80 cm 2.15 cm/m  RA Area:     17.80 cm LA Vol (A2C):   98.9 ml 55.89 ml/m RA Volume:   53.60 ml  30.29 ml/m LA Vol (A4C):   92.5 ml 52.27 ml/m LA Biplane Vol: 97.1 ml 54.87 ml/m  AORTIC VALVE                    PULMONIC VALVE AV Area (Vmax):    3.10 cm     PV Vmax:        1.10 m/s AV Area (Vmean):   2.97 cm     PV Peak grad:   4.8 mmHg AV Area (VTI):     3.30 cm     RVOT Peak grad: 5 mmHg AV Vmax:           171.00 cm/s AV Vmean:          113.000 cm/s AV VTI:            0.235 m AV Peak Grad:      11.7 mmHg AV Mean Grad:      6.0 mmHg LVOT Vmax:         169.00 cm/s LVOT Vmean:        107.000 cm/s LVOT VTI:          0.247 m LVOT/AV VTI ratio: 1.05  AORTA Ao Root diam: 3.20 cm MITRAL VALVE               TRICUSPID VALVE MV Area (PHT): 3.40 cm    TR Peak grad:   18.7 mmHg MV Decel Time:  223 msec    TR Vmax:        216.00 cm/s MV E velocity: 92.10 cm/s MV A velocity: 71.60 cm/s  SHUNTS MV E/A ratio:  1.29        Systemic VTI:  0.25 m                            Systemic  Diam: 2.00 cm Ida Rogue MD Electronically signed by Ida Rogue MD Signature Date/Time: 12/06/2020/3:31:49 PM    Final      Medications:   . famotidine (PEPCID) IV 20 mg (12/07/20 1110)   . atorvastatin  40 mg Oral Daily  . heparin  5,000 Units Subcutaneous Q8H  . losartan  50 mg Oral Daily  . methylPREDNISolone  4 mg Oral PC lunch  . methylPREDNISolone  4 mg Oral PC supper  . [START ON 12/08/2020] methylPREDNISolone  4 mg Oral 3 x daily with food  . [START ON 12/09/2020] methylPREDNISolone  4 mg Oral 4X daily taper  . methylPREDNISolone  8 mg Oral Nightly  . [START ON 12/08/2020] methylPREDNISolone  8 mg Oral Nightly  . metoprolol succinate  100 mg Oral Daily  . nicotine  21 mg Transdermal Daily  . pantoprazole (PROTONIX) IV  40 mg Intravenous Q12H  . sertraline  25 mg Oral Daily   acetaminophen, calcium carbonate, chlorpheniramine-HYDROcodone, hydrALAZINE, menthol-cetylpyridinium, nitroGLYCERIN, ondansetron (ZOFRAN) IV, traMADol  Assessment/ Plan:  55 y.o. male with end-stage renal disease, hypertension was admitted on 12/06/2020 for  Principal Problem:   Chest pain Active Problems:   Anemia of chronic kidney failure, stage 5 (HCC)   ESRD on peritoneal dialysis (HCC)   HTN (hypertension)   Nicotine dependence   Intractable vomiting with nausea  Elevated troponin [R77.8] Chest pain [R07.9] Nonspecific chest pain [R07.9] Intractable vomiting with nausea [R11.2]  #Chest pain Cardiac work-up is in progress Possibly related to acute pericarditis-likely cause under dialysis and uremia -Treatment with IV steroids -Daily hemodialysis   #. ESRD Outpatient PD regimen: 2000 cc fill volume x4 cycles Discussed with patient to switch to daily hemodialysis for the next few days to correct  uremia.  Patient has agreed to proceed.  Vascular surgery has been consulted for temporary dialysis catheter placement. Plan for daily dialysis for the next few days to correct uremia Outpatient PD regimen will have to be modified for more aggressive HD   #. Anemia of CKD  Lab Results  Component Value Date   HGB 9.1 (L) 12/06/2020  Epogen to be continued as outpatient per protocol We will give subcu dose of Epogen as inpatient.   #. Secondary hyperparathyroidism of renal origin N 25.81   No results found for: PTH Lab Results  Component Value Date   PHOS 3.0 01/01/2015   Monitor calcium and phos level during this admission     LOS: [place] 3/18/202212:47 [place], [place]

## 2020-12-07 NOTE — Progress Notes (Signed)
Patient in dialysis with first hemodialysis treatment. blood pressure dropped. UF turned off. Heart rate tachycardia and A-fib. Dr. Candiss Norse gave order to turned down BFR 300-150 and DFR 600-300. Ten minutes later patient complained off feeling light headed and nausea. Dr. Candiss Norse gave order to discontinue hemodialysis treatment, give 225m normal saline bolus. Patient stated, that he felt a little better. Patient's  nurse KMaggie Schwalbe RN made aware of signs, and symptoms, patient's complaints and early termination of hemodialysis.

## 2020-12-07 NOTE — Progress Notes (Signed)
PROGRESS NOTE    Larry [name]   [id number]  DOB: Oct 27, 1965  DOA: 12/06/2020 PCP: Gillis Santa, MD   Brief Narrative:  Gilman Buttner Quant 55 year old male with ESRD on HD, smoker, HTN presenting to the hospital with chest pain.  He tells me he has been vomiting for about 5 days now.  He has a sore throat at this point.  He does smoke cigarettes and has a chronic cough as well.  He was recently started on doxycycline for a "head cold" and worsening cough and he took 2 doses of the day prior to admission   Subjective: Vomiting again at 3 AM. Not tolerating food. Still has on and off chest pain. No diarrhea.     Assessment & Plan:   Principal Problem:   Chest pain - cardiology feels it may be pericarditis due to uremia- has been started on steroids - plan discussed with cardiology - using Tramadol PRN  Active Problems:   Intractable vomiting with nausea x 5 days prior to admission - not much better- check xray abd- not tender though or distended -  cont PPI and Pepcid BID- may be uremic and improve after better dialysis  - current NPO     ESRD on peritoneal dialysis -   Anemia of chronic kidney failure, stage 5   -Have consulted nephrology to assist with management    HTN (hypertension) -Continue losartan, hydralazine and metoprolol    Nicotine dependence -NicoDerm patch ordered   Time spent in minutes: 35 DVT prophylaxis: heparin injection 5,000 Units Start: 12/06/20 0600  Code Status: Full code Family Communication:  Level of Care: Level of care: Med-Surg Disposition Plan:  Status is: Inpatient  Remains inpatient appropriate because:Inpatient level of care appropriate due to severity of illness   Dispo: The patient is from: Home              Anticipated d/c is to: Home              Patient currently is not medically stable to d/c.   Difficult to place patient No      Consultants:   Cardiology  nephrology Procedures:   none Antimicrobials:   Anti-infectives (From admission, onward)   None       Objective: Vitals:   12/07/20 0617 12/07/20 0729 12/07/20 1131 12/07/20 1329  BP: 109/61 (!) 105/55 (!) 106/58 (!) 109/59  Pulse: 83 79 79 84  Resp: '20 19 19 17  '$ Temp: 98.5 F (36.9 C) 98.1 F (36.7 C) 98.2 F (36.8 C) 98.2 F (36.8 C)  TempSrc: Oral Oral Oral Oral  SpO2: 98% 99% 99% 98%  Weight: 68.4 kg     Height:       No intake or output data in the 24 hours ending 12/07/20 1336 Filed Weights   12/06/20 0017 12/07/20 0617  Weight: 68 kg 68.4 kg    Examination: General exam: Appears comfortable  HEENT: PERRLA, oral mucosa moist, no sclera icterus or thrush Respiratory system: Clear to auscultation. Respiratory effort normal. Cardiovascular system: S1 & S2 heard, RRR.   Gastrointestinal system: Abdomen soft, non-tender, nondistended. Normal bowel sounds. Central nervous system: Alert and oriented. No focal neurological deficits. Extremities: No cyanosis, clubbing or edema Skin: No rashes or ulcers Psychiatry:  Mood & affect appropriate.     Data Reviewed: I have personally reviewed following labs and imaging studies  CBC: Recent Labs  Lab 12/06/20 0019 12/06/20 0520  WBC 14.1* 16.0*  NEUTROABS 10.7*  --  HGB 9.5* 9.1*  HCT 28.7* 28.3*  MCV 91.4 93.4  PLT 242 [id number]   Basic Metabolic Panel: Recent Labs  Lab 12/06/20 0019 12/06/20 0520  NA 134*  --   K 4.2  --   CL 105  --   CO2 9*  --   GLUCOSE 128*  --   BUN 139*  --   CREATININE 23.62* 22.99*  CALCIUM 7.0*  --    GFR: Estimated Creatinine Clearance: 3.3 mL/min (A) (by C-G formula based on SCr of 22.99 mg/dL (H)). Liver Function Tests: Recent Labs  Lab 12/06/20 0019  AST 13*  ALT 11  ALKPHOS 59  BILITOT 0.8  PROT 6.0*  ALBUMIN 3.2*   Recent Labs  Lab 12/06/20 0019  LIPASE 31   No results for input(s): AMMONIA in the last 168 hours. Coagulation Profile: No results for input(s): INR, PROTIME in the last 168 hours. Cardiac  Enzymes: No results for input(s): CKTOTAL, CKMB, CKMBINDEX, TROPONINI in the last 168 hours. BNP (last 3 results) No results for input(s): PROBNP in the last 8760 hours. HbA1C: Recent Labs    12/06/20 0520  HGBA1C 6.0*   CBG: No results for input(s): GLUCAP in the last 168 hours. Lipid Profile: Recent Labs    12/06/20 0520  CHOL 140  HDL 27*  LDLCALC 82  TRIG 154*  CHOLHDL 5.2   Thyroid Function Tests: No results for input(s): TSH, T4TOTAL, FREET4, T3FREE, THYROIDAB in the last 72 hours. Anemia Panel: No results for input(s): VITAMINB12, FOLATE, FERRITIN, TIBC, IRON, RETICCTPCT in the last 72 hours. Urine analysis:    Component Value Date/Time   COLORURINE YELLOW (A) 12/14/2018 1613   APPEARANCEUR CLEAR (A) 12/14/2018 1613   APPEARANCEUR Clear 10/15/2017 1512   LABSPEC 1.017 12/14/2018 1613   LABSPEC 1.011 01/18/2015 1423   PHURINE 7.0 12/14/2018 1613   GLUCOSEU 150 (A) 12/14/2018 1613   GLUCOSEU >=500 01/18/2015 1423   HGBUR SMALL (A) 12/14/2018 1613   BILIRUBINUR NEGATIVE 12/14/2018 1613   BILIRUBINUR Negative 10/15/2017 1512   BILIRUBINUR Negative 01/18/2015 1423   KETONESUR 5 (A) 12/14/2018 1613   PROTEINUR >=300 (A) 12/14/2018 1613   NITRITE NEGATIVE 12/14/2018 1613   LEUKOCYTESUR NEGATIVE 12/14/2018 1613   LEUKOCYTESUR Negative 01/18/2015 1423   Sepsis Labs: '@LABRCNTIP'$ (procalcitonin:4,lacticidven:4) ) Recent Results (from the past 240 hour(s))  Resp Panel by RT-PCR (Flu A&B, Covid) Nasopharyngeal Swab     Status: None   Collection Time: 12/06/20  1:49 AM   Specimen: Nasopharyngeal Swab; Nasopharyngeal(NP) swabs in vial transport medium  Result Value Ref Range Status   SARS Coronavirus 2 by RT PCR NEGATIVE NEGATIVE Final    Comment: (NOTE) SARS-CoV-2 target nucleic acids are NOT DETECTED.  The SARS-CoV-2 RNA is generally detectable in upper respiratory specimens during the acute phase of infection. The lowest concentration of SARS-CoV-2 viral copies  this assay can detect is 138 copies/mL. A negative result does not preclude SARS-Cov-2 infection and should not be used as the sole basis for treatment or other patient management decisions. A negative result may occur with  improper specimen collection/handling, submission of specimen other than nasopharyngeal swab, presence of viral mutation(s) within the areas targeted by this assay, and inadequate number of viral copies(<138 copies/mL). A negative result must be combined with clinical observations, patient history, and epidemiological information. The expected result is Negative.  Fact Sheet for Patients:  EntrepreneurPulse.com.au  Fact Sheet for Healthcare Providers:  IncredibleEmployment.be  This test is no t yet approved or cleared by the  [place] [employer]ural [employer] and  has been authorized for detection and/or diagnosis of SARS-CoV-2 by FDA under an Print production planner (EUA). This EUA will remain  in effect (meaning this test can be used) for the duration of the COVID-19 declaration under Section 564(b)(1) of the Act, 21 U.S.C.section 360bbb-3(b)(1), unless the authorization is terminated  or revoked sooner.       Influenza A by PCR NEGATIVE NEGATIVE Final   Influenza B by PCR NEGATIVE NEGATIVE Final    Comment: (NOTE) The Xpert Xpress SARS-CoV-2/FLU/RSV plus assay is intended as an aid in the diagnosis of influenza from Nasopharyngeal swab specimens and should not be used as a sole basis for treatment. Nasal washings and aspirates are unacceptable for Xpert Xpress SARS-CoV-2/FLU/RSV testing.  Fact Sheet for Patients: EntrepreneurPulse.com.au  Fact Sheet for Healthcare Providers: IncredibleEmployment.be  This test is not yet approved or cleared by the [place] FDA and has been authorized for detection and/or diagnosis of SARS-CoV-2 by FDA under an Emergency Use Authorization (EUA). This EUA  will remain in effect (meaning this test can be used) for the duration of the COVID-19 declaration under Section 564(b)(1) of the Act, 21 U.S.C. section 360bbb-3(b)(1), unless the authorization is terminated or revoked.  Performed at [facility], [address], [place], [place] [address]          Radiology Studies: CT Angio Chest PE W and/or Wo Contrast  Result Date: 12/06/2020 CLINICAL DATA:  Chest pain, a respiratory infection, emesis EXAM: CT ANGIOGRAPHY CHEST WITH CONTRAST TECHNIQUE: Multidetector CT imaging of the chest was performed using the standard protocol during bolus administration of intravenous contrast. Multiplanar CT image reconstructions and MIPs were obtained to evaluate the vascular anatomy. CONTRAST:  45m OMNIPAQUE IOHEXOL 350 MG/ML SOLN COMPARISON:  12/06/2020, 03/07/2015 FINDINGS: Cardiovascular: This is a technically adequate evaluation of the pulmonary vasculature. No filling defects or pulmonary emboli. The heart is enlarged without pericardial effusion. Extensive atherosclerosis within the coronary vasculature greatest in the LAD and circumflex distributions. No evidence of thoracic aortic aneurysm or dissection. Mediastinum/Nodes: No enlarged mediastinal, hilar, or axillary lymph nodes. Thyroid gland, trachea, and esophagus demonstrate no significant findings. Lungs/Pleura: Mild emphysematous changes at the apices. Hypoventilatory changes at the lung bases. No airspace disease, effusion, or pneumothorax. The central airways are patent. Upper Abdomen: No acute abnormality. Musculoskeletal: No acute or destructive bony lesions. Reconstructed images demonstrate no additional findings. Review of the MIP images confirms the above findings. IMPRESSION: 1. No evidence of pulmonary embolus. 2. Cardiomegaly, with prominent coronary artery atherosclerosis. 3.  Emphysema (ICD10-J43.9). Electronically Signed   By: MRanda NgoM.D.   On: 12/06/2020 03:57   DG Chest  Port 1 View  Result Date: 12/06/2020 CLINICAL DATA:  Chest pain, upper respiratory infection EXAM: PORTABLE CHEST 1 VIEW COMPARISON:  12/14/2018 FINDINGS: The heart size and mediastinal contours are within normal limits. Both lungs are clear. The visualized skeletal structures are unremarkable. IMPRESSION: No active disease. Electronically Signed   By: MRanda NgoM.D.   On: 12/06/2020 01:42   ECHOCARDIOGRAM COMPLETE  Result Date: 12/06/2020    ECHOCARDIOGRAM REPORT   Patient Name:   Larry GECKDate of Exam: 12/06/2020 Medical Rec #:  [id number]      Height:       66.0 in Accession #:    [id number]     Weight:       150.0 lb Date of Birth:  3Mar 26, 1967       BSA:  1.770 m Patient Age:    5 years        BP:           150/90 mmHg Patient Gender: M               HR:           86 bpm. Exam Location:  [facility] Procedure: 2D Echo, Color Doppler, Cardiac Doppler and Strain Analysis Indications:     Chest pain R07.9  History:         Patient has no prior history of Echocardiogram examinations.                  Risk Factors:Hypertension. CKD.  Sonographer:     Sherrie Sport RDCS (AE) Referring Phys:  [id number] Athena Masse Diagnosing Phys: Ida Rogue MD  Sonographer Comments: Global longitudinal strain was attempted. IMPRESSIONS  1. Left ventricular ejection fraction, by estimation, is 55 to 60%. The left ventricle has normal function. The left ventricle has no regional wall motion abnormalities. There is moderate left ventricular hypertrophy. Left ventricular diastolic parameters are consistent with Grade II diastolic dysfunction (pseudonormalization). The average left ventricular global longitudinal strain is -9.4 %. The global longitudinal strain is abnormal.  2. Right ventricular systolic function is normal. The right ventricular size is normal. FINDINGS  Left Ventricle: Left ventricular ejection fraction, by estimation, is 55 to 60%. The left ventricle has normal function. The left ventricle has no  regional wall motion abnormalities. The average left ventricular global longitudinal strain is -9.4 %. The  global longitudinal strain is abnormal. The left ventricular internal cavity size was normal in size. There is moderate left ventricular hypertrophy. Left ventricular diastolic parameters are consistent with Grade II diastolic dysfunction (pseudonormalization). Right Ventricle: The right ventricular size is normal. No increase in right ventricular wall thickness. Right ventricular systolic function is normal. Left Atrium: Left atrial size was normal in size. Right Atrium: Right atrial size was normal in size. Pericardium: There is no evidence of pericardial effusion. Mitral Valve: The mitral valve is normal in structure. No evidence of mitral valve regurgitation. No evidence of mitral valve stenosis. Tricuspid Valve: The tricuspid valve is normal in structure. Tricuspid valve regurgitation is mild . No evidence of tricuspid stenosis. Aortic Valve: The aortic valve was not well visualized. Aortic valve regurgitation is not visualized. No aortic stenosis is present. Aortic valve mean gradient measures 6.0 mmHg. Aortic valve peak gradient measures 11.7 mmHg. Aortic valve area, by VTI measures 3.30 cm. Pulmonic Valve: The pulmonic valve was normal in structure. Pulmonic valve regurgitation is not visualized. No evidence of pulmonic stenosis. Aorta: The aortic root is normal in size and structure. Venous: The inferior vena cava is normal in size with greater than 50% respiratory variability, suggesting right atrial pressure of 3 mmHg. IAS/Shunts: No atrial level shunt detected by color flow Doppler.  LEFT VENTRICLE PLAX 2D LVIDd:         4.35 cm  Diastology LVIDs:         2.43 cm  LV e' medial:    6.20 cm/s LV PW:         1.45 cm  LV E/e' medial:  14.9 LV IVS:        1.08 cm  LV e' lateral:   6.64 cm/s LVOT diam:     2.00 cm  LV E/e' lateral: 13.9 LV SV:         78 LV SV Index:   44  2D Longitudinal Strain  LVOT Area:     3.14 cm 2D Strain GLS Avg:     -9.4 %                          3D Volume EF:                         3D EF:        53 %                         LV EDV:       193 ml                         LV ESV:       91 ml                         LV SV:        102 ml RIGHT VENTRICLE RV Basal diam:  3.96 cm RV S prime:     20.20 cm/s TAPSE (M-mode): 4.0 cm LEFT ATRIUM             Index       RIGHT ATRIUM           Index LA diam:        3.80 cm 2.15 cm/m  RA Area:     17.80 cm LA Vol (A2C):   98.9 ml 55.89 ml/m RA Volume:   53.60 ml  30.29 ml/m LA Vol (A4C):   92.5 ml 52.27 ml/m LA Biplane Vol: 97.1 ml 54.87 ml/m  AORTIC VALVE                    PULMONIC VALVE AV Area (Vmax):    3.10 cm     PV Vmax:        1.10 m/s AV Area (Vmean):   2.97 cm     PV Peak grad:   4.8 mmHg AV Area (VTI):     3.30 cm     RVOT Peak grad: 5 mmHg AV Vmax:           171.00 cm/s AV Vmean:          113.000 cm/s AV VTI:            0.235 m AV Peak Grad:      11.7 mmHg AV Mean Grad:      6.0 mmHg LVOT Vmax:         169.00 cm/s LVOT Vmean:        107.000 cm/s LVOT VTI:          0.247 m LVOT/AV VTI ratio: 1.05  AORTA Ao Root diam: 3.20 cm MITRAL VALVE               TRICUSPID VALVE MV Area (PHT): 3.40 cm    TR Peak grad:   18.7 mmHg MV Decel Time: 223 msec    TR Vmax:        216.00 cm/s MV E velocity: 92.10 cm/s MV A velocity: 71.60 cm/s  SHUNTS MV E/A ratio:  1.29        Systemic VTI:  0.25 m                            Systemic  Diam: 2.00 cm Ida Rogue MD Electronically signed by Ida Rogue MD Signature Date/Time: 12/06/2020/3:31:49 PM    Final       Scheduled Meds: . atorvastatin  40 mg Oral Daily  . [START ON 12/08/2020] Chlorhexidine Gluconate Cloth  6 each Topical Q0600  . heparin  5,000 Units Subcutaneous Q8H  . losartan  50 mg Oral Daily  . methylPREDNISolone  4 mg Oral PC lunch  . methylPREDNISolone  4 mg Oral PC supper  . [START ON 12/08/2020] methylPREDNISolone  4 mg Oral 3 x daily with food  . [START ON  12/09/2020] methylPREDNISolone  4 mg Oral 4X daily taper  . methylPREDNISolone  8 mg Oral Nightly  . [START ON 12/08/2020] methylPREDNISolone  8 mg Oral Nightly  . metoprolol succinate  100 mg Oral Daily  . nicotine  21 mg Transdermal Daily  . pantoprazole (PROTONIX) IV  40 mg Intravenous Q12H  . sertraline  25 mg Oral Daily   Continuous Infusions: . famotidine (PEPCID) IV 20 mg (12/07/20 1110)     LOS: 1 day      Debbe Odea, MD [facility] Pager: [email or web address] 12/07/2020, 1:36 PM

## 2020-12-08 DIAGNOSIS — I480 Paroxysmal atrial fibrillation: Secondary | ICD-10-CM

## 2020-12-08 DIAGNOSIS — R072 Precordial pain: Secondary | ICD-10-CM

## 2020-12-08 LAB — RENAL FUNCTION PANEL
Albumin: 2.6 g/dL — ABNORMAL LOW (ref 3.5–5.0)
Anion gap: 19 — ABNORMAL HIGH (ref 5–15)
BUN: 125 mg/dL — ABNORMAL HIGH (ref 6–20)
CO2: 14 mmol/L — ABNORMAL LOW (ref 22–32)
Calcium: 8.1 mg/dL — ABNORMAL LOW (ref 8.9–10.3)
Chloride: 99 mmol/L (ref 98–111)
Creatinine, Ser: 19.42 mg/dL — ABNORMAL HIGH (ref 0.61–1.24)
GFR, Estimated: 3 mL/min — ABNORMAL LOW (ref >60)
Glucose, Bld: 113 mg/dL — ABNORMAL HIGH (ref 70–99)
Phosphorus: 11.5 mg/dL — ABNORMAL HIGH (ref 2.5–4.6)
Potassium: 4.5 mmol/L (ref 3.5–5.1)
Sodium: 132 mmol/L — ABNORMAL LOW (ref 135–145)

## 2020-12-08 LAB — TSH: TSH: 1.151 u[IU]/mL (ref 0.350–4.500)

## 2020-12-08 LAB — MAGNESIUM: Magnesium: 2.1 mg/dL (ref 1.7–2.4)

## 2020-12-08 MED ORDER — GENTAMICIN SULFATE 0.1 % EX CREA
1.0000 "application " | TOPICAL_CREAM | Freq: Every day | CUTANEOUS | Status: DC
  Administered 2020-12-09: 1 via TOPICAL
  Filled 2020-12-08: qty 15

## 2020-12-08 MED ORDER — DELFLEX-LC/1.5% DEXTROSE 344 MOSM/L IP SOLN
INTRAPERITONEAL | Status: DC
  Filled 2020-12-08 (×2): qty 3000

## 2020-12-08 MED ORDER — AMIODARONE HCL 200 MG PO TABS
400.0000 mg | ORAL_TABLET | Freq: Two times a day (BID) | ORAL | Status: DC
  Administered 2020-12-08 – 2020-12-09 (×2): 400 mg via ORAL
  Filled 2020-12-08 (×2): qty 2

## 2020-12-08 NOTE — Progress Notes (Signed)
PROGRESS NOTE    DONAVON GERHOLD   [id number]  DOB: 1966-05-24  DOA: 12/06/2020 PCP: Gillis Santa, MD   Brief Narrative:  Gilman Buttner Erker 55 year old male with ESRD on HD, smoker, HTN presenting to the hospital with chest pain.  He tells me he has been vomiting for about 5 days now.  He has a sore throat at this point.  He does smoke cigarettes and has a chronic cough as well.  He was recently started on doxycycline for a "head cold" and worsening cough and he took 2 doses of the day prior to admission   Subjective: No new complaints.  He is hungry and wants to eat.  Assessment & Plan:   Principal Problem:   Chest pain - cardiology feels it may be pericarditis due to uremia- has been started on a Medrol Dosepak -I also feel is partly related to days of vomiting and probable esophagitis as a result - plan discussed with cardiology - using Tramadol PRN, PPI and Pepcid  Active Problems:   Intractable vomiting with nausea x 5 days prior to admission - not much better- check xray abd- not tender though or distended -  cont PPI and Pepcid BID- may be uremic and improve after better dialysis  -We will start clear liquids and follow     ESRD on peritoneal dialysis -   Anemia of chronic kidney failure, stage 5   -Have consulted nephrology to assist with management -Dialysis catheter placed yesterday and dialysis started yesterday  New onset atrial fibrillation with RVR-paroxysmal -The patient tells me today that he developed an irregular heart rate during dialysis yesterday associated with low blood pressures and dialysis was stopped-she received 1-1/2 hours total of dialysis -The patient was placed on an amiodarone infusion yesterday evening-he converted to normal sinus rhythm -Continue Toprol 100 mg daily and amiodarone 400 mg twice daily per cardiology-no plan to anticoagulate unless he develops another episode of A. fib during this hospital stay -TSH 1.151    HTN  (hypertension) -Continue losartan, hydralazine and metoprolol and follow BP    Nicotine dependence -NicoDerm patch ordered   Time spent in minutes: 35 DVT prophylaxis: heparin injection 5,000 Units Start: 12/06/20 0600  Code Status: Full code Family Communication:  Level of Care: Level of care: Med-Surg Disposition Plan:  Status is: Inpatient  Remains inpatient appropriate because:Inpatient level of care appropriate due to severity of illness   Dispo: The patient is from: Home              Anticipated d/c is to: Home              Patient currently is not medically stable to d/c.   Difficult to place patient No      Consultants:   Cardiology  nephrology Procedures:   none Antimicrobials:  Anti-infectives (From admission, onward)   None       Objective: Vitals:   12/08/20 0329 12/08/20 0605 12/08/20 0816 12/08/20 1128  BP: 113/64  121/75 129/82  Pulse: 77  76 78  Resp: '16  19 19  '$ Temp: 98.5 F (36.9 C)  98.4 F (36.9 C) 97.8 F (36.6 C)  TempSrc: Oral  Oral Oral  SpO2: 98%  97% 97%  Weight:  69.3 kg    Height:       No intake or output data in the 24 hours ending 12/08/20 1203 Filed Weights   12/06/20 0017 12/07/20 0617 12/08/20 0605  Weight: 68 kg 68.4 kg 69.3 kg  Examination: General exam: Appears comfortable  HEENT: PERRLA, oral mucosa moist, no sclera icterus or thrush Respiratory system: Clear to auscultation. Respiratory effort normal. Cardiovascular system: S1 & S2 heard, regular rate and rhythm Gastrointestinal system: Abdomen soft, non-tender, nondistended. Normal bowel sounds   Central nervous system: Alert and oriented. No focal neurological deficits. Extremities: No cyanosis, clubbing or edema Skin: No rashes or ulcers Psychiatry:  Mood & affect appropriate.     Data Reviewed: I have personally reviewed following labs and imaging studies  CBC: Recent Labs  Lab 12/06/20 0019 12/06/20 0520  WBC 14.1* 16.0*  NEUTROABS  10.7*  --   HGB 9.5* 9.1*  HCT 28.7* 28.3*  MCV 91.4 93.4  PLT 242 [id number]   Basic Metabolic Panel: Recent Labs  Lab 12/06/20 0019 12/06/20 0520 12/08/20 0449  NA 134*  --  132*  K 4.2  --  4.5  CL 105  --  99  CO2 9*  --  14*  GLUCOSE 128*  --  113*  BUN 139*  --  125*  CREATININE 23.62* 22.99* 19.42*  CALCIUM 7.0*  --  8.1*  MG  --   --  2.1  PHOS  --   --  11.5*   GFR: Estimated Creatinine Clearance: 3.9 mL/min (A) (by C-G formula based on SCr of 19.42 mg/dL (H)). Liver Function Tests: Recent Labs  Lab 12/06/20 0019 12/08/20 0449  AST 13*  --   ALT 11  --   ALKPHOS 59  --   BILITOT 0.8  --   PROT 6.0*  --   ALBUMIN 3.2* 2.6*   Recent Labs  Lab 12/06/20 0019  LIPASE 31   No results for input(s): AMMONIA in the last 168 hours. Coagulation Profile: No results for input(s): INR, PROTIME in the last 168 hours. Cardiac Enzymes: No results for input(s): CKTOTAL, CKMB, CKMBINDEX, TROPONINI in the last 168 hours. BNP (last 3 results) No results for input(s): PROBNP in the last 8760 hours. HbA1C: Recent Labs    12/06/20 0520  HGBA1C 6.0*   CBG: No results for input(s): GLUCAP in the last 168 hours. Lipid Profile: Recent Labs    12/06/20 0520  CHOL 140  HDL 27*  LDLCALC 82  TRIG 154*  CHOLHDL 5.2   Thyroid Function Tests: Recent Labs    12/08/20 0449  TSH 1.151   Anemia Panel: No results for input(s): VITAMINB12, FOLATE, FERRITIN, TIBC, IRON, RETICCTPCT in the last 72 hours. Urine analysis:    Component Value Date/Time   COLORURINE YELLOW (A) 12/14/2018 1613   APPEARANCEUR CLEAR (A) 12/14/2018 1613   APPEARANCEUR Clear 10/15/2017 1512   LABSPEC 1.017 12/14/2018 1613   LABSPEC 1.011 01/18/2015 1423   PHURINE 7.0 12/14/2018 1613   GLUCOSEU 150 (A) 12/14/2018 1613   GLUCOSEU >=500 01/18/2015 1423   HGBUR SMALL (A) 12/14/2018 1613   BILIRUBINUR NEGATIVE 12/14/2018 1613   BILIRUBINUR Negative 10/15/2017 1512   BILIRUBINUR Negative 01/18/2015  1423   KETONESUR 5 (A) 12/14/2018 1613   PROTEINUR >=300 (A) 12/14/2018 1613   NITRITE NEGATIVE 12/14/2018 1613   LEUKOCYTESUR NEGATIVE 12/14/2018 1613   LEUKOCYTESUR Negative 01/18/2015 1423   Sepsis Labs: '@LABRCNTIP'$ (procalcitonin:4,lacticidven:4) ) Recent Results (from the past 240 hour(s))  Resp Panel by RT-PCR (Flu A&B, Covid) Nasopharyngeal Swab     Status: None   Collection Time: 12/06/20  1:49 AM   Specimen: Nasopharyngeal Swab; Nasopharyngeal(NP) swabs in vial transport medium  Result Value Ref Range Status   SARS Coronavirus 2 by RT  PCR NEGATIVE NEGATIVE Final    Comment: (NOTE) SARS-CoV-2 target nucleic acids are NOT DETECTED.  The SARS-CoV-2 RNA is generally detectable in upper respiratory specimens during the acute phase of infection. The lowest concentration of SARS-CoV-2 viral copies this assay can detect is 138 copies/mL. A negative result does not preclude SARS-Cov-2 infection and should not be used as the sole basis for treatment or other patient management decisions. A negative result may occur with  improper specimen collection/handling, submission of specimen other than nasopharyngeal swab, presence of viral mutation(s) within the areas targeted by this assay, and inadequate number of viral copies(<138 copies/mL). A negative result must be combined with clinical observations, patient history, and epidemiological information. The expected result is Negative.  Fact Sheet for Patients:  EntrepreneurPulse.com.au  Fact Sheet for Healthcare Providers:  IncredibleEmployment.be  This test is no t yet approved or cleared by the [place] FDA and  has been authorized for detection and/or diagnosis of SARS-CoV-2 by FDA under an Emergency Use Authorization (EUA). This EUA will remain  in effect (meaning this test can be used) for the duration of the COVID-19 declaration under Section 564(b)(1) of the Act, 21 U.S.C.section  360bbb-3(b)(1), unless the authorization is terminated  or revoked sooner.       Influenza A by PCR NEGATIVE NEGATIVE Final   Influenza B by PCR NEGATIVE NEGATIVE Final    Comment: (NOTE) The Xpert Xpress SARS-CoV-2/FLU/RSV plus assay is intended as an aid in the diagnosis of influenza from Nasopharyngeal swab specimens and should not be used as a sole basis for treatment. Nasal washings and aspirates are unacceptable for Xpert Xpress SARS-CoV-2/FLU/RSV testing.  Fact Sheet for Patients: EntrepreneurPulse.com.au  Fact Sheet for Healthcare Providers: IncredibleEmployment.be  This test is not yet approved or cleared by the [place] FDA and has been authorized for detection and/or diagnosis of SARS-CoV-2 by FDA under an Emergency Use Authorization (EUA). This EUA will remain in effect (meaning this test can be used) for the duration of the COVID-19 declaration under Section 564(b)(1) of the Act, 21 U.S.C. section 360bbb-3(b)(1), unless the authorization is terminated or revoked.  Performed at [facility], [address], [place], [place] [address]          Radiology Studies: PERIPHERAL VASCULAR CATHETERIZATION  Result Date: 12/07/2020 See Op Note  DG Abd Portable 2V  Result Date: 12/07/2020 CLINICAL DATA:  Vomiting for 1 week with chest pain. End-stage renal disease. Hypertension. EXAM: PORTABLE ABDOMEN - 2 VIEW COMPARISON:  10/01/2020 FINDINGS: Indistinct left basilar airspace opacity potentially from atelectasis or pneumonia. Unremarkable bowel gas pattern. Peritoneal dialysis catheter projects over the left abdomen. No findings of free intraperitoneal gas or abnormal air-fluid levels. IMPRESSION: 1. Unremarkable bowel gas pattern. 2. Indistinct left basilar airspace opacity potentially from atelectasis or pneumonia. Electronically Signed   By: Van Clines M.D.   On: 12/07/2020 16:11   ECHOCARDIOGRAM  COMPLETE  Result Date: 12/06/2020    ECHOCARDIOGRAM REPORT   Patient Name:   EMMETTE KROG Date of Exam: 12/06/2020 Medical Rec #:  [id number]       Height:       66.0 in Accession #:    [id number]      Weight:       150.0 lb Date of Birth:  September 17, 1966        BSA:          1.770 m Patient Age:    42 years        BP:  150/90 mmHg Patient Gender: M               HR:           86 bpm. Exam Location:  [facility] Procedure: 2D Echo, Color Doppler, Cardiac Doppler and Strain Analysis Indications:     Chest pain R07.9  History:         Patient has no prior history of Echocardiogram examinations.                  Risk Factors:Hypertension. CKD.  Sonographer:     Sherrie Sport RDCS (AE) Referring Phys:  [id number] Athena Masse Diagnosing Phys: Ida Rogue MD  Sonographer Comments: Global longitudinal strain was attempted. IMPRESSIONS  1. Left ventricular ejection fraction, by estimation, is 55 to 60%. The left ventricle has normal function. The left ventricle has no regional wall motion abnormalities. There is moderate left ventricular hypertrophy. Left ventricular diastolic parameters are consistent with Grade II diastolic dysfunction (pseudonormalization). The average left ventricular global longitudinal strain is -9.4 %. The global longitudinal strain is abnormal.  2. Right ventricular systolic function is normal. The right ventricular size is normal. FINDINGS  Left Ventricle: Left ventricular ejection fraction, by estimation, is 55 to 60%. The left ventricle has normal function. The left ventricle has no regional wall motion abnormalities. The average left ventricular global longitudinal strain is -9.4 %. The  global longitudinal strain is abnormal. The left ventricular internal cavity size was normal in size. There is moderate left ventricular hypertrophy. Left ventricular diastolic parameters are consistent with Grade II diastolic dysfunction (pseudonormalization). Right Ventricle: The right ventricular size is  normal. No increase in right ventricular wall thickness. Right ventricular systolic function is normal. Left Atrium: Left atrial size was normal in size. Right Atrium: Right atrial size was normal in size. Pericardium: There is no evidence of pericardial effusion. Mitral Valve: The mitral valve is normal in structure. No evidence of mitral valve regurgitation. No evidence of mitral valve stenosis. Tricuspid Valve: The tricuspid valve is normal in structure. Tricuspid valve regurgitation is mild . No evidence of tricuspid stenosis. Aortic Valve: The aortic valve was not well visualized. Aortic valve regurgitation is not visualized. No aortic stenosis is present. Aortic valve mean gradient measures 6.0 mmHg. Aortic valve peak gradient measures 11.7 mmHg. Aortic valve area, by VTI measures 3.30 cm. Pulmonic Valve: The pulmonic valve was normal in structure. Pulmonic valve regurgitation is not visualized. No evidence of pulmonic stenosis. Aorta: The aortic root is normal in size and structure. Venous: The inferior vena cava is normal in size with greater than 50% respiratory variability, suggesting right atrial pressure of 3 mmHg. IAS/Shunts: No atrial level shunt detected by color flow Doppler.  LEFT VENTRICLE PLAX 2D LVIDd:         4.35 cm  Diastology LVIDs:         2.43 cm  LV e' medial:    6.20 cm/s LV PW:         1.45 cm  LV E/e' medial:  14.9 LV IVS:        1.08 cm  LV e' lateral:   6.64 cm/s LVOT diam:     2.00 cm  LV E/e' lateral: 13.9 LV SV:         78 LV SV Index:   44       2D Longitudinal Strain LVOT Area:     3.14 cm 2D Strain GLS Avg:     -9.4 %  3D Volume EF:                         3D EF:        53 %                         LV EDV:       193 ml                         LV ESV:       91 ml                         LV SV:        102 ml RIGHT VENTRICLE RV Basal diam:  3.96 cm RV S prime:     20.20 cm/s TAPSE (M-mode): 4.0 cm LEFT ATRIUM             Index       RIGHT ATRIUM            Index LA diam:        3.80 cm 2.15 cm/m  RA Area:     17.80 cm LA Vol (A2C):   98.9 ml 55.89 ml/m RA Volume:   53.60 ml  30.29 ml/m LA Vol (A4C):   92.5 ml 52.27 ml/m LA Biplane Vol: 97.1 ml 54.87 ml/m  AORTIC VALVE                    PULMONIC VALVE AV Area (Vmax):    3.10 cm     PV Vmax:        1.10 m/s AV Area (Vmean):   2.97 cm     PV Peak grad:   4.8 mmHg AV Area (VTI):     3.30 cm     RVOT Peak grad: 5 mmHg AV Vmax:           171.00 cm/s AV Vmean:          113.000 cm/s AV VTI:            0.235 m AV Peak Grad:      11.7 mmHg AV Mean Grad:      6.0 mmHg LVOT Vmax:         169.00 cm/s LVOT Vmean:        107.000 cm/s LVOT VTI:          0.247 m LVOT/AV VTI ratio: 1.05  AORTA Ao Root diam: 3.20 cm MITRAL VALVE               TRICUSPID VALVE MV Area (PHT): 3.40 cm    TR Peak grad:   18.7 mmHg MV Decel Time: 223 msec    TR Vmax:        216.00 cm/s MV E velocity: 92.10 cm/s MV A velocity: 71.60 cm/s  SHUNTS MV E/A ratio:  1.29        Systemic VTI:  0.25 m                            Systemic Diam: 2.00 cm Ida Rogue MD Electronically signed by Ida Rogue MD Signature Date/Time: 12/06/2020/3:31:49 PM    Final       Scheduled Meds: . amiodarone  400 mg Oral BID  . atorvastatin  40 mg Oral Daily  . Chlorhexidine  Gluconate Cloth  6 each Topical Q0600  . heparin  5,000 Units Subcutaneous Q8H  . losartan  50 mg Oral Daily  . methylPREDNISolone  4 mg Oral PC lunch  . methylPREDNISolone  4 mg Oral 3 x daily with food  . [START ON 12/09/2020] methylPREDNISolone  4 mg Oral 4X daily taper  . methylPREDNISolone  8 mg Oral Nightly  . metoprolol succinate  100 mg Oral Daily  . nicotine  21 mg Transdermal Daily  . pantoprazole (PROTONIX) IV  40 mg Intravenous Q12H  . sertraline  25 mg Oral Daily   Continuous Infusions: . famotidine (PEPCID) IV 20 mg (12/08/20 0934)     LOS: 2 days      Debbe Odea, MD [facility] Pager: [email or web address] 12/08/2020, 12:03 PM

## 2020-12-08 NOTE — Progress Notes (Signed)
Progress Note  Patient Name: Larry [name] Date of Encounter: 12/08/2020  Primary Cardiologist: New to [facility] - consult by Gollan  Subjective   Felt to have acute pericarditis and started on steroids (unable to use colchicine given renal failure) possibly viral in etiology given recent URI vs uremic. Historically, he is a PD patient, and has been transitioned to HD for now to improve uremia, though HD was shorted on 3/18 with the development of new onset Afib with RVR with associated hypotension and was treated with NS and IV amiodarone which persisted until ~ 23:02. With this arrhythmia, his BP was soft and he noted significant palpitations. Currently in sinus rhythm. Able to lay fully supine without pain. Chest discomfort is improving, though is still noted when he belches.   Inpatient Medications    Scheduled Meds: . atorvastatin  40 mg Oral Daily  . Chlorhexidine Gluconate Cloth  6 each Topical Q0600  . heparin  5,000 Units Subcutaneous Q8H  . losartan  50 mg Oral Daily  . methylPREDNISolone  4 mg Oral PC lunch  . methylPREDNISolone  4 mg Oral 3 x daily with food  . [START ON 12/09/2020] methylPREDNISolone  4 mg Oral 4X daily taper  . methylPREDNISolone  8 mg Oral Nightly  . metoprolol succinate  100 mg Oral Daily  . nicotine  21 mg Transdermal Daily  . pantoprazole (PROTONIX) IV  40 mg Intravenous Q12H  . sertraline  25 mg Oral Daily   Continuous Infusions: . famotidine (PEPCID) IV 20 mg (12/07/20 2130)   PRN Meds: acetaminophen, calcium carbonate, chlorpheniramine-HYDROcodone, hydrALAZINE, menthol-cetylpyridinium, nitroGLYCERIN, ondansetron (ZOFRAN) IV, traMADol   Vital Signs    Vitals:   12/07/20 2300 12/08/20 0329 12/08/20 0605 12/08/20 0816  BP: (!) 102/57 113/64  121/75  Pulse: 78 77  76  Resp: '17 16  19  '$ Temp: 98.5 F (36.9 C) 98.5 F (36.9 C)  98.4 F (36.9 C)  TempSrc:  Oral  Oral  SpO2: 98% 98%  97%  Weight:   69.3 kg   Height:        Intake/Output  Summary (Last 24 hours) at 12/08/2020 0823 Last data filed at 12/07/2020 1110 Gross per 24 hour  Intake 100 ml  Output --  Net 100 ml   Filed Weights   12/06/20 0017 12/07/20 0617 12/08/20 0605  Weight: 68 kg 68.4 kg 69.3 kg    Telemetry    Afib with RVR developing prior to 17:00 on 3/18 which persisted until ~ 23:00 with conversion to sinus rhythm with frequent PACs - Personally Reviewed  ECG    No new tracings - Personally Reviewed  Physical Exam   GEN: No acute distress.   Neck: No JVD. Cardiac: RRR, + rub, no murmur or gallops.  Respiratory: Diminished breath sounds bilaterally with expiratory wheezing.  GI: Soft, nontender, non-distended.   MS: No edema; No deformity. Neuro:  Alert and oriented x 3; Nonfocal.  Psych: Normal affect.  Labs    Chemistry Recent Labs  Lab 12/06/20 0019 12/06/20 0520 12/08/20 0449  NA 134*  --  132*  K 4.2  --  4.5  CL 105  --  99  CO2 9*  --  14*  GLUCOSE 128*  --  113*  BUN 139*  --  125*  CREATININE 23.62* 22.99* 19.42*  CALCIUM 7.0*  --  8.1*  PROT 6.0*  --   --   ALBUMIN 3.2*  --  2.6*  AST 13*  --   --  ALT 11  --   --   ALKPHOS 59  --   --   BILITOT 0.8  --   --   GFRNONAA 2* 2* 3*  ANIONGAP 20*  --  19*     Hematology Recent Labs  Lab 12/06/20 0019 12/06/20 0520  WBC 14.1* 16.0*  RBC 3.14* 3.03*  HGB 9.5* 9.1*  HCT 28.7* 28.3*  MCV 91.4 93.4  MCH 30.3 30.0  MCHC 33.1 32.2  RDW 16.0* 16.0*  PLT 242 239    Cardiac EnzymesNo results for input(s): TROPONINI in the last 168 hours. No results for input(s): TROPIPOC in the last 168 hours.   BNPNo results for input(s): BNP, PROBNP in the last 168 hours.   DDimer No results for input(s): DDIMER in the last 168 hours.   Radiology    CT Angio Chest PE W and/or Wo Contrast  Result Date: 12/06/2020 IMPRESSION: 1. No evidence of pulmonary embolus. 2. Cardiomegaly, with prominent coronary artery atherosclerosis. 3.  Emphysema (ICD10-J43.9). Electronically  Signed   By: Randa Ngo M.D.   On: 12/06/2020 03:57   DG Chest Port 1 View  Result Date: 12/06/2020 IMPRESSION: No active disease. Electronically Signed   By: Randa Ngo M.D.   On: 12/06/2020 01:42   Cardiac Studies   2D echo 12/06/2020: 1. Left ventricular ejection fraction, by estimation, is 55 to 60%. The  left ventricle has normal function. The left ventricle has no regional  wall motion abnormalities. There is moderate left ventricular hypertrophy.  Left ventricular diastolic  parameters are consistent with Grade II diastolic dysfunction  (pseudonormalization). The average left ventricular global longitudinal  strain is -9.4 %. The global longitudinal strain is abnormal.  2. Right ventricular systolic function is normal. The right ventricular  size is normal.   Patient Profile     55 y.o. male with history of ESRD on PD since 2019, anemia of chronic disease, emphysema with ongoing tobacco use at at least 1 pack/day, and HTN who is being seen today for the evaluation of elevated troponin at the request of Dr. Damita Dunnings.  Assessment & Plan    1. Acute pericarditis: -Possibly viral given recent URI symptoms vs uremic given significantly elevated BUN and SCr, possibly in the setting of under diuresis  -Unable to use colchicine given renal failure and in this setting, he has been started on steroids  -Case has been discussed between Drs. Ileene Patrick with plans to change PD to HD for the next couple of days to correct uremia   2. Elevated troponin with coronary artery calcification: -Minimally elevated and downtrending, not consistent with ACS -Symptoms are overall atypical though he does have significant risk factors including documented coronary artery calcification, ongoing tobacco use, male status of age 29 -Felt to be supply demand ischemia -Reproducible to palpation  -Echo with preserved LVSF and normal wall motion -Plan for outpatient Lexiscan MPI to evaluate for  high risk ischemia once his acute illness is improved -Recommend aggressive risk factor modification including complete smoking cessation -A1c 6.0  3. New onset Afib with RVR: -Likely in the setting of his acute illness -With his arrhythmia, he was hypotensive  -Converted to sinus rhythm with IV amiodarone with heart rates currently in the 80s bpm -Continue Toprol XL 100 mg, if needed, could decrease losartan to allow for added BP room to titrate beta blocker -Long term amiodarone is not ideal given his age, after discussion with MD we will place him on oral amiodarone 400  mg twice daily for 1 week followed by 20 mg twice daily for 1 week followed by 2 mg daily thereafter in the acute setting though would look to discontinue this down the road given his age -For now, we will defer adding [place] unless he has recurrent A. fib during this admission -Potassium at goal -Check TSH and magnesium  -CHADS2VASc at least 2 (HTN, vascular disease)  4.  ESRD with uremia: -Dialysis shortened on 3/18 with the development of Afib with RVR with associated hypotension  -Dialysis per nephrology  5.  HTN: -Blood pressure is reasonably controlled -Continue current medical therapy  6.  Anemia of chronic disease: -Stable on last check  7.  URI: -Management per primary service -Will need outpatient follow-up with PCP  8.  Emphysema with ongoing tobacco use: -Complete cessation of tobacco is recommended -Nicotine patches  9. HLD: -LDL 82 this admission with goal being < 70 with noted coronary artery calcium on imaging  -Lipitor 40 mg daily -Follow up fasting lipid panel an ALT in ~ 8 weeks   For questions or updates, please contact [facility] Please consult [email or web address] for contact info under Cardiology/STEMI.    Signed, Christell Faith, PA-C [place] Pager: [phone] 12/08/2020, 8:23 AM

## 2020-12-08 NOTE — Progress Notes (Signed)
   12/07/20 1929  Assess: MEWS Score  Temp (!) 97.5 F (36.4 C)  BP (!) 72/55  Pulse Rate 93  Level of Consciousness Alert  SpO2 98 %  O2 Device Room Air  Assess: MEWS Score  MEWS Temp 0  MEWS Systolic 2  MEWS Pulse 0  MEWS RR 0  MEWS LOC 0  MEWS Score 2  MEWS Score Color Yellow  Assess: if the MEWS score is Yellow or Red  Were vital signs taken at a resting state? Yes  Focused Assessment Change from prior assessment (see assessment flowsheet)  Early Detection of Sepsis Score *See Row Information* Medium  MEWS guidelines implemented *See Row Information* Yes  Treat  MEWS Interventions Escalated (See documentation below)  Pain Scale 0-10  Pain Score 8  Pain Type Acute pain  Pain Location Chest  Pain Orientation Mid  Pain Radiating Towards back  Pain Descriptors / Indicators Restless;Sharp  Pain Frequency Constant  Patients Stated Pain Goal 2  Take Vital Signs  Increase Vital Sign Frequency  Yellow: Q 2hr X 2 then Q 4hr X 2, if remains yellow, continue Q 4hrs  Escalate  MEWS: Escalate Yellow: discuss with charge nurse/RN and consider discussing with provider and RRT  Notify: Charge Nurse/RN  Name of Charge Nurse/RN Notified Levester Fresh, RN  Date Charge Nurse/RN Notified 12/07/20  Time Charge Nurse/RN Notified 1930   Rufina Falco NP notified of Afib RVR, new orders for NS 269m bolus and  Amio bolus only.  Will cont. To monitor vitals,

## 2020-12-08 NOTE — Progress Notes (Signed)
[facility], [place] 12/08/20  Subjective:   LOS: 2   Patient seen resting in bed. He reports feeling 'Ok' now, but he had an episode of irregular heart rate and palpitations yesterday, found that he was in Afib. It was after his HD session. He is requesting to hold HD today.  Objective:  Vital signs in last 24 hours:  Temp:  [97.5 F (36.4 C)-98.6 F (37 C)] 97.8 F (36.6 C) (03/19 1128) Pulse Rate:  [42-135] 78 (03/19 1128) Resp:  [16-25] 19 (03/19 1128) BP: (72-129)/(55-84) 129/82 (03/19 1128) SpO2:  [95 %-100 %] 97 % (03/19 1128) Weight:  [69.3 kg] 69.3 kg (03/19 0605)  Weight change: 0.864 kg Filed Weights   12/06/20 0017 12/07/20 0617 12/08/20 0605  Weight: 68 kg 68.4 kg 69.3 kg    Intake/Output:   No intake or output data in the 24 hours ending 12/08/20 1553  Physical Exam: General:  Awake, alert, in no acute distress  HEENT  Normocephalic,atraumatic  Pulm/lungs  Respirations even,unlabored,lungs clear  CVS/Heart  S1S2, friction rub +  Abdomen:   Soft, nontender,non distended  Extremities:  No peripheral edema  Neurologic: Oriented x 3  Skin:  No acute rashes or lesions Rt.Femoral catheter for HD access    Basic Metabolic Panel:  Recent Labs  Lab 12/06/20 0019 12/06/20 0520 12/08/20 0449  NA 134*  --  132*  K 4.2  --  4.5  CL 105  --  99  CO2 9*  --  14*  GLUCOSE 128*  --  113*  BUN 139*  --  125*  CREATININE 23.62* 22.99* 19.42*  CALCIUM 7.0*  --  8.1*  MG  --   --  2.1  PHOS  --   --  11.5*     CBC: Recent Labs  Lab 12/06/20 0019 12/06/20 0520  WBC 14.1* 16.0*  NEUTROABS 10.7*  --   HGB 9.5* 9.1*  HCT 28.7* 28.3*  MCV 91.4 93.4  PLT 242 239      Lab Results  Component Value Date   HEPBSAG NON REACTIVE 12/07/2020      Microbiology:  Recent Results (from the past 240 hour(s))  Resp Panel by RT-PCR (Flu A&B, Covid) Nasopharyngeal Swab     Status: None   Collection Time: 12/06/20  1:49 AM    Specimen: Nasopharyngeal Swab; Nasopharyngeal(NP) swabs in vial transport medium  Result Value Ref Range Status   SARS Coronavirus 2 by RT PCR NEGATIVE NEGATIVE Final    Comment: (NOTE) SARS-CoV-2 target nucleic acids are NOT DETECTED.  The SARS-CoV-2 RNA is generally detectable in upper respiratory specimens during the acute phase of infection. The lowest concentration of SARS-CoV-2 viral copies this assay can detect is 138 copies/mL. A negative result does not preclude SARS-Cov-2 infection and should not be used as the sole basis for treatment or other patient management decisions. A negative result may occur with  improper specimen collection/handling, submission of specimen other than nasopharyngeal swab, presence of viral mutation(s) within the areas targeted by this assay, and inadequate number of viral copies(<138 copies/mL). A negative result must be combined with clinical observations, patient history, and epidemiological information. The expected result is Negative.  Fact Sheet for Patients:  EntrepreneurPulse.com.au  Fact Sheet for Healthcare Providers:  IncredibleEmployment.be  This test is no t yet approved or cleared by the [place] FDA and  has been authorized for detection and/or diagnosis of SARS-CoV-2 by FDA under an Emergency Use Authorization (EUA). This EUA  will remain  in effect (meaning this test can be used) for the duration of the COVID-19 declaration under Section 564(b)(1) of the Act, 21 U.S.C.section 360bbb-3(b)(1), unless the authorization is terminated  or revoked sooner.       Influenza A by PCR NEGATIVE NEGATIVE Final   Influenza B by PCR NEGATIVE NEGATIVE Final    Comment: (NOTE) The Xpert Xpress SARS-CoV-2/FLU/RSV plus assay is intended as an aid in the diagnosis of influenza from Nasopharyngeal swab specimens and should not be used as a sole basis for treatment. Nasal washings and aspirates are  unacceptable for Xpert Xpress SARS-CoV-2/FLU/RSV testing.  Fact Sheet for Patients: EntrepreneurPulse.com.au  Fact Sheet for Healthcare Providers: IncredibleEmployment.be  This test is not yet approved or cleared by the [place] FDA and has been authorized for detection and/or diagnosis of SARS-CoV-2 by FDA under an Emergency Use Authorization (EUA). This EUA will remain in effect (meaning this test can be used) for the duration of the COVID-19 declaration under Section 564(b)(1) of the Act, 21 U.S.C. section 360bbb-3(b)(1), unless the authorization is terminated or revoked.  Performed at [facility], [address] [place], [place] [address]     Coagulation Studies: No results for input(s): LABPROT, INR in the last 72 hours.  Urinalysis: No results for input(s): COLORURINE, LABSPEC, PHURINE, GLUCOSEU, HGBUR, BILIRUBINUR, KETONESUR, PROTEINUR, UROBILINOGEN, NITRITE, LEUKOCYTESUR in the last 72 hours.  Invalid input(s): APPERANCEUR    Imaging: PERIPHERAL VASCULAR CATHETERIZATION  Result Date: 12/07/2020 See Op Note  DG Abd Portable 2V  Result Date: 12/07/2020 CLINICAL DATA:  Vomiting for 1 week with chest pain. End-stage renal disease. Hypertension. EXAM: PORTABLE ABDOMEN - 2 VIEW COMPARISON:  10/01/2020 FINDINGS: Indistinct left basilar airspace opacity potentially from atelectasis or pneumonia. Unremarkable bowel gas pattern. Peritoneal dialysis catheter projects over the left abdomen. No findings of free intraperitoneal gas or abnormal air-fluid levels. IMPRESSION: 1. Unremarkable bowel gas pattern. 2. Indistinct left basilar airspace opacity potentially from atelectasis or pneumonia. Electronically Signed   By: Van Clines M.D.   On: 12/07/2020 16:11     Medications:   . famotidine (PEPCID) IV 20 mg (12/08/20 0934)   . amiodarone  400 mg Oral BID  . atorvastatin  40 mg Oral Daily  . Chlorhexidine Gluconate  Cloth  6 each Topical Q0600  . heparin  5,000 Units Subcutaneous Q8H  . losartan  50 mg Oral Daily  . methylPREDNISolone  4 mg Oral 3 x daily with food  . [START ON 12/09/2020] methylPREDNISolone  4 mg Oral 4X daily taper  . methylPREDNISolone  8 mg Oral Nightly  . metoprolol succinate  100 mg Oral Daily  . nicotine  21 mg Transdermal Daily  . pantoprazole (PROTONIX) IV  40 mg Intravenous Q12H  . sertraline  25 mg Oral Daily   acetaminophen, calcium carbonate, chlorpheniramine-HYDROcodone, hydrALAZINE, menthol-cetylpyridinium, nitroGLYCERIN, ondansetron (ZOFRAN) IV, traMADol  Assessment/ Plan:  55 y.o. male with end-stage renal disease, hypertension was admitted on 12/06/2020 for  Principal Problem:   Chest pain Active Problems:   Anemia of chronic kidney failure, stage 5 (HCC)   ESRD on peritoneal dialysis (HCC)   HTN (hypertension)   Nicotine dependence   Intractable vomiting with nausea   Paroxysmal atrial fibrillation (HCC)  Elevated troponin [R77.8] Chest pain [R07.9] Nonspecific chest pain [R07.9] Intractable vomiting with nausea [R11.2]  #Chest pain S/B Cardiology Appears pleuritic Episode of Atrial fibrillation yesterday after HD,received IV Amiodarone Patient was symptomatic with diaphoresis and palpitations Back to SR , heart  rate in 70's to 80's  #. ESRD Outpatient PD regimen: 2000 cc fill volume x4 cycles Switched to hemodialysis to control uremia Rt.Femoral catheter placed by Vascular team for hemodialysis access Patient had an episode of Afib after HD yesterday Holding HD today Will continue monitoring closely  #. Anemia of CKD  Lab Results  Component Value Date   HGB 9.1 (L) 12/06/2020  Will continue monitoring CBCs   #. Secondary hyperparathyroidism of renal origin N 25.81   No results found for: PTH Lab Results  Component Value Date   PHOS 11.5 (H) 12/08/2020  Hyperphosphatemia noted      LOS: 2 Crosby Oyster 3/19/20223:53 PM  [place], [place]

## 2020-12-09 DIAGNOSIS — I319 Disease of pericardium, unspecified: Secondary | ICD-10-CM

## 2020-12-09 DIAGNOSIS — N19 Unspecified kidney failure: Secondary | ICD-10-CM

## 2020-12-09 DIAGNOSIS — I10 Essential (primary) hypertension: Secondary | ICD-10-CM

## 2020-12-09 DIAGNOSIS — I480 Paroxysmal atrial fibrillation: Secondary | ICD-10-CM

## 2020-12-09 MED ORDER — FAMOTIDINE IN NACL 20-0.9 MG/50ML-% IV SOLN: 20.0000 mg | INTRAVENOUS | Status: DC

## 2020-12-09 MED ORDER — METHYLPREDNISOLONE 4 MG PO TBPK
4.0000 mg | ORAL_TABLET | Freq: Once | ORAL | Status: DC
  Filled 2020-12-09: qty 21

## 2020-12-09 MED ORDER — METHYLPREDNISOLONE 4 MG PO TBPK: ORAL_TABLET | ORAL | 0 refills | Status: DC

## 2020-12-09 MED ORDER — ATORVASTATIN CALCIUM 40 MG PO TABS: 40.0000 mg | ORAL_TABLET | Freq: Every day | ORAL | 0 refills | Status: DC

## 2020-12-09 MED ORDER — AMIODARONE HCL 400 MG PO TABS: 400.0000 mg | ORAL_TABLET | Freq: Two times a day (BID) | ORAL | 0 refills | Status: DC

## 2020-12-09 NOTE — Progress Notes (Signed)
Progress Note  Patient Name: Larry [name] Date of Encounter: 12/09/2020  Primary Cardiologist: New to [facility] - consult by G[place]  Subjective   Felt to have acute pericarditis and started on steroids (unable to use colchicine given renal failure) possibly viral in etiology given recent URI vs uremic. Historically, he is a PD patient, and has been transitioned to HD for now to improve uremia, though HD was shorted on 3/18 with the development of new onset Afib with RVR with associated hypotension and was treated with NS and IV amiodarone which persisted until ~ 23:02. With this arrhythmia, his BP was soft and he noted significant palpitations. Started on oral amiodarone 3/19.  Given the above, he declined HD on 3/19 and underwent PD last evening. Chest discomfort is improved on prednisone.  Inpatient Medications    Scheduled Meds: . amiodarone  400 mg Oral BID  . atorvastatin  40 mg Oral Daily  . Chlorhexidine Gluconate Cloth  6 each Topical Q0600  . gentamicin cream  1 application Topical Daily  . heparin  5,000 Units Subcutaneous Q8H  . losartan  50 mg Oral Daily  . methylPREDNISolone  4 mg Oral 4X daily taper  . metoprolol succinate  100 mg Oral Daily  . nicotine  21 mg Transdermal Daily  . pantoprazole (PROTONIX) IV  40 mg Intravenous Q12H  . sertraline  25 mg Oral Daily   Continuous Infusions: . dialysis solution 1.5% low-MG/low-CA    . famotidine (PEPCID) IV 20 mg (12/08/20 2229)   PRN Meds: acetaminophen, calcium carbonate, chlorpheniramine-HYDROcodone, hydrALAZINE, menthol-cetylpyridinium, nitroGLYCERIN, ondansetron (ZOFRAN) IV, traMADol   Vital Signs    Vitals:   12/08/20 1604 12/08/20 1922 12/09/20 0343 12/09/20 0500  BP: (!) 154/76 (!) 157/90 (!) 161/95   Pulse: 87 87 78   Resp: '19 16 15   '$ Temp: 97.8 F (36.6 C) 98.3 F (36.8 C) 98.6 F (37 C)   TempSrc: Oral Oral Oral   SpO2: 96% 97% 96%   Weight:    69.4 kg  Height:        Intake/Output Summary (Last  24 hours) at 12/09/2020 0817 Last data filed at 12/09/2020 0300 Gross per 24 hour  Intake 150 ml  Output 200 ml  Net -50 ml   Filed Weights   12/07/20 0617 12/08/20 0605 12/09/20 0500  Weight: 68.4 kg 69.3 kg 69.4 kg    Telemetry    Unable to review given hospital wide power outage this morning - Personally Reviewed  ECG    No new tracings - Personally Reviewed  Physical Exam   GEN: No acute distress.   Neck: No JVD. Cardiac: RRR, + rub, no murmur or gallops.  Respiratory: Diminished breath sounds bilaterally with expiratory wheezing.  GI: Soft, nontender, non-distended.   MS: No edema; No deformity. Neuro:  Alert and oriented x 3; Nonfocal.  Psych: Normal affect.  Labs    Chemistry Recent Labs  Lab 12/06/20 0019 12/06/20 0520 12/08/20 0449  NA 134*  --  132*  K 4.2  --  4.5  CL 105  --  99  CO2 9*  --  14*  GLUCOSE 128*  --  113*  BUN 139*  --  125*  CREATININE 23.62* 22.99* 19.42*  CALCIUM 7.0*  --  8.1*  PROT 6.0*  --   --   ALBUMIN 3.2*  --  2.6*  AST 13*  --   --   ALT 11  --   --  ALKPHOS 59  --   --   BILITOT 0.8  --   --   GFRNONAA 2* 2* 3*  ANIONGAP 20*  --  19*     Hematology Recent Labs  Lab 12/06/20 0019 12/06/20 0520  WBC 14.1* 16.0*  RBC 3.14* 3.03*  HGB 9.5* 9.1*  HCT 28.7* 28.3*  MCV 91.4 93.4  MCH 30.3 30.0  MCHC 33.1 32.2  RDW 16.0* 16.0*  PLT 242 239    Cardiac EnzymesNo results for input(s): TROPONINI in the last 168 hours. No results for input(s): TROPIPOC in the last 168 hours.   BNPNo results for input(s): BNP, PROBNP in the last 168 hours.   DDimer No results for input(s): DDIMER in the last 168 hours.   Radiology    CT Angio Chest PE W and/or Wo Contrast  Result Date: 12/06/2020 IMPRESSION: 1. No evidence of pulmonary embolus. 2. Cardiomegaly, with prominent coronary artery atherosclerosis. 3.  Emphysema (ICD10-J43.9). Electronically Signed   By: Randa Ngo M.D.   On: 12/06/2020 03:57   DG Chest Port 1  View  Result Date: 12/06/2020 IMPRESSION: No active disease. Electronically Signed   By: Randa Ngo M.D.   On: 12/06/2020 01:42   Cardiac Studies   2D echo 12/06/2020: 1. Left ventricular ejection fraction, by estimation, is 55 to 60%. The  left ventricle has normal function. The left ventricle has no regional  wall motion abnormalities. There is moderate left ventricular hypertrophy.  Left ventricular diastolic  parameters are consistent with Grade II diastolic dysfunction  (pseudonormalization). The average left ventricular global longitudinal  strain is -9.4 %. The global longitudinal strain is abnormal.  2. Right ventricular systolic function is normal. The right ventricular  size is normal.   Patient Profile     55 y.o. male with history of ESRD on PD since 2019, anemia of chronic disease, emphysema with ongoing tobacco use at at least 1 pack/day, and HTN who is being seen today for the evaluation of elevated troponin at the request of Dr. Damita Dunnings.  Assessment & Plan    1. Acute pericarditis: -Possibly viral given recent URI symptoms vs uremic given significantly elevated BUN and SCr, possibly in the setting of under diuresis  -Unable to use colchicine given renal failure and in this setting, he has been started on steroids  -Case has been discussed between Drs. Ileene Patrick with plans to change PD to HD to correct uremia, however he did not tolerate HD on 3/18 with the development of symptomatic Afib with RVR and associated hypotension and in this setting has declined further HD -PD per nephrology   2. Elevated troponin with coronary artery calcification: -Minimally elevated and downtrending, not consistent with ACS -Symptoms are overall atypical though he does have significant risk factors including documented coronary artery calcification, ongoing tobacco use, male status of age 55 -Felt to be supply demand ischemia -Reproducible to palpation  -Echo with preserved LVSF  and normal wall motion -Plan for outpatient Lexiscan MPI to evaluate for high risk ischemia once his acute illness is improved -Recommend aggressive risk factor modification including complete smoking cessation -A1c 6.0  3. New onset Afib with RVR: -Likely in the setting of his acute illness -With his arrhythmia, he was hypotensive  -Converted to sinus rhythm with IV amiodarone  -Continue Toprol XL 100 mg, if needed, could decrease losartan to allow for added BP room to titrate beta blocker -Long term amiodarone is not ideal given his age, after discussion with MD  he was started on oral amiodarone 400 mg twice daily on 3/19, for 1 week followed by 200 mg twice daily for 1 week followed by 200 mg daily thereafter in the acute setting; though would look to discontinue this down the road given his age -For now, we will defer adding [place] unless he has recurrent A. fib during this admission -Potassium at goal -TSH normal, magnesium at goal  -CHADS2VASc at least 2 (HTN, vascular disease) -Will have the office send him a Zio patch through the mail to evaluate for recurrent Afib  4.  ESRD with uremia: -Dialysis shortened on 3/18 with the development of Afib with RVR with associated hypotension with further HD declined -PD per nephrology  5.  HTN: -Blood pressure ha been overall reasonably controlled, though is mildly elevated overnight  -If BP remains elevated this morning, may need to escalate therapy   6.  Anemia of chronic disease: -Stable on last check  7.  URI: -Management per primary service -Will need outpatient follow-up with PCP  8.  Emphysema with ongoing tobacco use: -Complete cessation of tobacco is recommended -Nicotine patches  9. HLD: -LDL 82 this admission with goal being < 70 with noted coronary artery calcium on imaging  -Lipitor 40 mg daily -Follow up fasting lipid panel an ALT in ~ 8 weeks   For questions or updates, please contact [facility] Please  consult [email or web address] for contact info under Cardiology/STEMI.    Signed, Christell Faith, PA-C [place] Pager: [phone] 12/09/2020, 8:17 AM

## 2020-12-09 NOTE — Progress Notes (Signed)
[facility], [place] 12/09/20  Subjective:   LOS: 3   Patient appears comfortable and pleasant today.He denies further episodes of chest pain, SOB, palpitations or diaphoresis.  He tolerted PD treatment  last night.  Objective:  Vital signs in last 24 hours:  Temp:  [97.7 F (36.5 C)-98.6 F (37 C)] 98 F (36.7 C) (03/20 1218) Pulse Rate:  [75-87] 75 (03/20 1218) Resp:  [15-19] 15 (03/20 0343) BP: (154-182)/(76-97) 174/97 (03/20 1218) SpO2:  [95 %-97 %] 96 % (03/20 1218) Weight:  [69.4 kg] 69.4 kg (03/20 0500)  Weight change: 0.136 kg Filed Weights   12/07/20 0617 12/08/20 0605 12/09/20 0500  Weight: 68.4 kg 69.3 kg 69.4 kg    Intake/Output:    Intake/Output Summary (Last 24 hours) at 12/09/2020 1525 Last data filed at 12/09/2020 0300 Gross per 24 hour  Intake 150 ml  Output 200 ml  Net -50 ml    Physical Exam: General:  In no acute distress  HEENT  Normocephalic,atraumatic  Pulm/lungs  Lungs clear, respirations even,unlabored  CVS/Heart  S1S2, friction rub +  Abdomen:   Soft, nontender,non distended  Extremities:  No peripheral edema  Neurologic: Oriented x 3  Skin:  No acute rashes or lesions Rt.Femoral catheter for HD access to be taken out today    Basic Metabolic Panel:  Recent Labs  Lab 12/06/20 0019 12/06/20 0520 12/08/20 0449  NA 134*  --  132*  K 4.2  --  4.5  CL 105  --  99  CO2 9*  --  14*  GLUCOSE 128*  --  113*  BUN 139*  --  125*  CREATININE 23.62* 22.99* 19.42*  CALCIUM 7.0*  --  8.1*  MG  --   --  2.1  PHOS  --   --  11.5*     CBC: Recent Labs  Lab 12/06/20 0019 12/06/20 0520  WBC 14.1* 16.0*  NEUTROABS 10.7*  --   HGB 9.5* 9.1*  HCT 28.7* 28.3*  MCV 91.4 93.4  PLT 242 239      Lab Results  Component Value Date   HEPBSAG NON REACTIVE 12/07/2020      Microbiology:  Recent Results (from the past 240 hour(s))  Resp Panel by RT-PCR (Flu A&B, Covid) Nasopharyngeal Swab     Status:  None   Collection Time: 12/06/20  1:49 AM   Specimen: Nasopharyngeal Swab; Nasopharyngeal(NP) swabs in vial transport medium  Result Value Ref Range Status   SARS Coronavirus 2 by RT PCR NEGATIVE NEGATIVE Final    Comment: (NOTE) SARS-CoV-2 target nucleic acids are NOT DETECTED.  The SARS-CoV-2 RNA is generally detectable in upper respiratory specimens during the acute phase of infection. The lowest concentration of SARS-CoV-2 viral copies this assay can detect is 138 copies/mL. A negative result does not preclude SARS-Cov-2 infection and should not be used as the sole basis for treatment or other patient management decisions. A negative result may occur with  improper specimen collection/handling, submission of specimen other than nasopharyngeal swab, presence of viral mutation(s) within the areas targeted by this assay, and inadequate number of viral copies(<138 copies/mL). A negative result must be combined with clinical observations, patient history, and epidemiological information. The expected result is Negative.  Fact Sheet for Patients:  EntrepreneurPulse.com.au  Fact Sheet for Healthcare Providers:  IncredibleEmployment.be  This test is no t yet approved or cleared by the [place] FDA and  has been authorized for detection and/or diagnosis of SARS-CoV-2 by FDA  under an Emergency Use Authorization (EUA). This EUA will remain  in effect (meaning this test can be used) for the duration of the COVID-19 declaration under Section 564(b)(1) of the Act, 21 U.S.C.section 360bbb-3(b)(1), unless the authorization is terminated  or revoked sooner.       Influenza A by PCR NEGATIVE NEGATIVE Final   Influenza B by PCR NEGATIVE NEGATIVE Final    Comment: (NOTE) The Xpert Xpress SARS-CoV-2/FLU/RSV plus assay is intended as an aid in the diagnosis of influenza from Nasopharyngeal swab specimens and should not be used as a sole basis for  treatment. Nasal washings and aspirates are unacceptable for Xpert Xpress SARS-CoV-2/FLU/RSV testing.  Fact Sheet for Patients: EntrepreneurPulse.com.au  Fact Sheet for Healthcare Providers: IncredibleEmployment.be  This test is not yet approved or cleared by the [place] FDA and has been authorized for detection and/or diagnosis of SARS-CoV-2 by FDA under an Emergency Use Authorization (EUA). This EUA will remain in effect (meaning this test can be used) for the duration of the COVID-19 declaration under Section 564(b)(1) of the Act, 21 U.S.C. section 360bbb-3(b)(1), unless the authorization is terminated or revoked.  Performed at [facility], [address] [place], [place] [address]     Coagulation Studies: No results for input(s): LABPROT, INR in the last 72 hours.  Urinalysis: No results for input(s): COLORURINE, LABSPEC, PHURINE, GLUCOSEU, HGBUR, BILIRUBINUR, KETONESUR, PROTEINUR, UROBILINOGEN, NITRITE, LEUKOCYTESUR in the last 72 hours.  Invalid input(s): APPERANCEUR    Imaging: DG Abd Portable 2V  Result Date: 12/07/2020 CLINICAL DATA:  Vomiting for 1 week with chest pain. End-stage renal disease. Hypertension. EXAM: PORTABLE ABDOMEN - 2 VIEW COMPARISON:  10/01/2020 FINDINGS: Indistinct left basilar airspace opacity potentially from atelectasis or pneumonia. Unremarkable bowel gas pattern. Peritoneal dialysis catheter projects over the left abdomen. No findings of free intraperitoneal gas or abnormal air-fluid levels. IMPRESSION: 1. Unremarkable bowel gas pattern. 2. Indistinct left basilar airspace opacity potentially from atelectasis or pneumonia. Electronically Signed   By: Van Clines M.D.   On: 12/07/2020 16:11     Medications:   . dialysis solution 1.5% low-MG/low-CA    . [START ON 12/10/2020] famotidine (PEPCID) IV     . amiodarone  400 mg Oral BID  . atorvastatin  40 mg Oral Daily  . Chlorhexidine  Gluconate Cloth  6 each Topical Q0600  . gentamicin cream  1 application Topical Daily  . heparin  5,000 Units Subcutaneous Q8H  . losartan  50 mg Oral Daily  . methylPREDNISolone  4 mg Oral Once  . metoprolol succinate  100 mg Oral Daily  . nicotine  21 mg Transdermal Daily  . pantoprazole (PROTONIX) IV  40 mg Intravenous Q12H  . sertraline  25 mg Oral Daily   acetaminophen, calcium carbonate, chlorpheniramine-HYDROcodone, hydrALAZINE, menthol-cetylpyridinium, nitroGLYCERIN, ondansetron (ZOFRAN) IV, traMADol  Assessment/ Plan:  55 y.o. male with end-stage renal disease, hypertension was admitted on 12/06/2020 for  Principal Problem:   Pericarditis Active Problems:   Anemia of chronic kidney failure, stage 5 (HCC)   ESRD on peritoneal dialysis (HCC)   HTN (hypertension)   Nicotine dependence   Intractable vomiting with nausea   Paroxysmal atrial fibrillation (HCC)   Uremia syndrome  Elevated troponin [R77.8] Chest pain [R07.9] Nonspecific chest pain [R07.9] Intractable vomiting with nausea [R11.2]  #Chest pain Followed up by Cardiology Episode of Atrial fibrillation  after HD on 12/07/2020 Patient denies further episodes    #. ESRD Outpatient PD regimen: 2000 cc fill volume x4 cycles Switched  to hemodialysis to control uremia Rt.Femoral catheter placed by Vascular team for hemodialysis access Patient had an episode of Afib after HD on 12/07/20, so further hemodialysis held Received PD last night and tolertated well Plan to resume PD daily at home  Discontinue femoral catheter before discharge  Will follow up as outpatient if getting discharged today  #. Anemia of CKD  Lab Results  Component Value Date   HGB 9.1 (L) 12/06/2020  Will continue monitoring CBCs   #. Secondary hyperparathyroidism of renal origin N 25.81   No results found for: PTH Lab Results  Component Value Date   PHOS 11.5 (H) 12/08/2020  Will follow up as outpatient      LOS: [place] 3/20/20223:25 [facility] [place], [place]

## 2020-12-09 NOTE — Progress Notes (Signed)
Patient discharged at this time to home with spouse via wheelchair with all belongings. Temp perm cath removed by IV team. Tolerated well and pressure dressing placed. Patient verbalized understanding of discharge instructions per AVS. PIV/Tele removed by Probation officer. NAD noted upon departure.

## 2020-12-09 NOTE — Discharge Summary (Addendum)
Physician Discharge Summary  Larry [name] [id number] DOB: December 22, 1965 DOA: 12/06/2020  PCP: Gillis Santa, MD  Admit date: 12/06/2020 Discharge date: 12/09/2020  Admitted From: home Disposition:  home   Recommendations for Outpatient Follow-up:  1. Encourage nicotine cessation  Home Health:  none  Discharge Condition:  stable   CODE STATUS:  Full code   Diet recommendation:  Renal, heart healthy Consultations:  Nephrology  cardiology  Procedures/Studies: . Dialysis cath   Discharge Diagnoses:  Principal Problem:   Pericarditis Active Problems:   Intractable vomiting with nausea   Paroxysmal atrial fibrillation (HCC)   Uremia syndrome   Anemia of chronic kidney failure, stage 5 (HCC)   ESRD on peritoneal dialysis (HCC)   HTN (hypertension)   Nicotine dependence     Brief Summary: Larry [name] 55 year old male with ESRD on HD, smoker, HTN presenting to the hospital with chest pain. He tells me he has been vomiting for about 5 days now. He has a sore throat at this point. He does smoke cigarettes and has a chronic cough as well. He was recently started on doxycycline for a "head cold" and worsening cough and he took 2 doses of the day prior to admission  Hospital Course:  Principal Problem:   Chest pain/ acute pericarditis due to uremia - cardiology feels it may be pericarditis due to uremia- has been started on a Medrol Dosepak -I also feel is partly related to days of vomiting and probable esophagitis as a result - plan discussed with cardiology - using Tramadol PRN, PPI and Pepcid - pain has resolved  Active Problems:   Intractable vomiting with nausea x 5 days prior to admission - not much better- check xray abd- not tender though or distended -  cont PPI and Pepcid BID- may be uremic and improve after better dialysis  - resolved- tolerating solids   ESRD on peritoneal dialysis - Anemia of chronic kidney failure, stage 5  -Have consulted  nephrology to assist with management -Dialysis catheter placed and dialysis done on 3/18- stopped after 1.5 hrs due to development of A-fib and hypotension-  - received a longer peritoneal dialysis last night and is less uremic now - dialysis cath to be removed today  New onset atrial fibrillation with RVR-paroxysmal -The patient tells me today that he developed an irregular heart rate during dialysis yesterday associated with low blood pressures and dialysis was stopped-she received 1-1/2 hours total of dialysis -The patient was placed on an amiodarone infusion yesterday evening-he converted to normal sinus rhythm -Continue Toprol 100 mg daily and amiodarone 400 mg twice daily x 1 wk and then 200 mg BID per cardiology-no plan to anticoagulate unless he develops another episode of A. fib during this hospital stay -TSH 1.151 - he will receive a Zio patch in the mail from cardiology to follow as outpt  HTN (hypertension) -Continue losartan, hydralazine and metoprolol and follow BP  Nicotine dependence -NicoDerm patch ordered    Discharge Exam: Vitals:   12/09/20 0846 12/09/20 1218  BP: (!) 182/93 (!) 174/97  Pulse: 75 75  Resp:    Temp: 97.7 F (36.5 C) 98 F (36.7 C)  SpO2: 95% 96%   Vitals:   12/09/20 0343 12/09/20 0500 12/09/20 0846 12/09/20 1218  BP: (!) 161/95  (!) 182/93 (!) 174/97  Pulse: 78  75 75  Resp: 15     Temp: 98.6 F (37 C)  97.7 F (36.5 C) 98 F (36.7 C)  TempSrc: Oral  Oral  Oral  SpO2: 96%  95% 96%  Weight:  69.4 kg    Height:        General: Pt is alert, awake, not in acute distress Cardiovascular: RRR, S1/S2 +, no rubs, no gallops Respiratory: CTA bilaterally, no wheezing, no rhonchi Abdominal: Soft, NT, ND, bowel sounds + Extremities: no edema, no cyanosis   Discharge Instructions  Discharge Instructions    Diet - low sodium heart healthy   Complete by: As directed    Renal diet   Increase activity slowly   Complete by: As  directed    No wound care   Complete by: As directed      Allergies as of 12/09/2020      Reactions   Mushroom Extract Complex    Headache, migraines   Amoxicillin Nausea And Vomiting   Weakness  Has patient had a PCN reaction causing immediate rash, facial/tongue/throat swelling, SOB or lightheadedness with hypotension: No Has patient had a PCN reaction causing severe rash involving mucus membranes or skin necrosis: No Has patient had a PCN reaction that required hospitalization: No Has patient had a PCN reaction occurring within the last 10 years: No If all of the above answers are "NO", then may proceed with Cephalosporin use.      Medication List    STOP taking these medications   doxycycline 100 MG capsule Commonly known as: VIBRAMYCIN     TAKE these medications   acetaminophen 500 MG tablet Commonly known as: TYLENOL Take by mouth.   amiodarone 400 MG tablet Commonly known as: PACERONE Take 1 tablet (400 mg total) by mouth 2 (two) times daily.   atorvastatin 40 MG tablet Commonly known as: LIPITOR Take 1 tablet (40 mg total) by mouth daily. Start taking on: December 10, 2020   calcium acetate 667 MG capsule Commonly known as: PHOSLO Take 1,334 mg by mouth 3 (three) times daily.   famotidine 20 MG tablet Commonly known as: PEPCID Take 1 tablet (20 mg total) by mouth 2 (two) times daily. What changed:   when to take this  reasons to take this   hydrALAZINE 50 MG tablet Commonly known as: APRESOLINE Take 50 mg by mouth daily as needed (If diastolic bp is 90 or above).   ibuprofen 200 MG tablet Commonly known as: ADVIL Take 400 mg by mouth daily as needed for headache or moderate pain.   losartan 50 MG tablet Commonly known as: COZAAR Take 50 mg by mouth daily.   methylPREDNISolone 4 MG Tbpk tablet Commonly known as: MEDROL DOSEPAK Day 1 (day of discharge): Take one tablet after supper and two tablets at bedtime. Day 2:Take one tablet before breakfast,  after lunch, after supper, and at bedtime. Day 3: take one tablet before breakfast, after lunch, at bedtime. Day 4: Take one tablet before breakfast and at bedtime. Day 5: take one tablet before breakfast.   metoprolol succinate 100 MG 24 hr tablet Commonly known as: TOPROL-XL Take 100 mg by mouth daily.   nicotine 14 mg/24hr patch Commonly known as: NICODERM CQ - dosed in mg/24 hours Place 14 mg onto the skin daily.   ondansetron 4 MG disintegrating tablet Commonly known as: ZOFRAN-ODT Take 4 mg by mouth every 8 (eight) hours as needed.   oxybutynin 5 MG tablet Commonly known as: DITROPAN Take 5 mg by mouth 2 (two) times daily.   sertraline 25 MG tablet Commonly known as: ZOLOFT Take 25 mg by mouth daily.   sodium bicarbonate 650 MG tablet  Take 1,300 mg by mouth 2 (two) times daily.       Allergies  Allergen Reactions  . Mushroom Extract Complex     Headache, migraines  . Amoxicillin Nausea And Vomiting    Weakness  Has patient had a PCN reaction causing immediate rash, facial/tongue/throat swelling, SOB or lightheadedness with hypotension: No Has patient had a PCN reaction causing severe rash involving mucus membranes or skin necrosis: No Has patient had a PCN reaction that required hospitalization: No Has patient had a PCN reaction occurring within the last 10 years: No If all of the above answers are "NO", then may proceed with Cephalosporin use.       CT Angio Chest PE W and/or Wo Contrast  Result Date: 12/06/2020 CLINICAL DATA:  Chest pain, a respiratory infection, emesis EXAM: CT ANGIOGRAPHY CHEST WITH CONTRAST TECHNIQUE: Multidetector CT imaging of the chest was performed using the standard protocol during bolus administration of intravenous contrast. Multiplanar CT image reconstructions and MIPs were obtained to evaluate the vascular anatomy. CONTRAST:  46m OMNIPAQUE IOHEXOL 350 MG/ML SOLN COMPARISON:  12/06/2020, 03/07/2015 FINDINGS: Cardiovascular: This is a  technically adequate evaluation of the pulmonary vasculature. No filling defects or pulmonary emboli. The heart is enlarged without pericardial effusion. Extensive atherosclerosis within the coronary vasculature greatest in the LAD and circumflex distributions. No evidence of thoracic aortic aneurysm or dissection. Mediastinum/Nodes: No enlarged mediastinal, hilar, or axillary lymph nodes. Thyroid gland, trachea, and esophagus demonstrate no significant findings. Lungs/Pleura: Mild emphysematous changes at the apices. Hypoventilatory changes at the lung bases. No airspace disease, effusion, or pneumothorax. The central airways are patent. Upper Abdomen: No acute abnormality. Musculoskeletal: No acute or destructive bony lesions. Reconstructed images demonstrate no additional findings. Review of the MIP images confirms the above findings. IMPRESSION: 1. No evidence of pulmonary embolus. 2. Cardiomegaly, with prominent coronary artery atherosclerosis. 3.  Emphysema (ICD10-J43.9). Electronically Signed   By: MRanda NgoM.D.   On: 12/06/2020 03:57   PERIPHERAL VASCULAR CATHETERIZATION  Result Date: 12/07/2020 See Op Note  DG Chest Port 1 View  Result Date: 12/06/2020 CLINICAL DATA:  Chest pain, upper respiratory infection EXAM: PORTABLE CHEST 1 VIEW COMPARISON:  12/14/2018 FINDINGS: The heart size and mediastinal contours are within normal limits. Both lungs are clear. The visualized skeletal structures are unremarkable. IMPRESSION: No active disease. Electronically Signed   By: MRanda NgoM.D.   On: 12/06/2020 01:42   DG Abd Portable 2V  Result Date: 12/07/2020 CLINICAL DATA:  Vomiting for 1 week with chest pain. End-stage renal disease. Hypertension. EXAM: PORTABLE ABDOMEN - 2 VIEW COMPARISON:  10/01/2020 FINDINGS: Indistinct left basilar airspace opacity potentially from atelectasis or pneumonia. Unremarkable bowel gas pattern. Peritoneal dialysis catheter projects over the left abdomen. No  findings of free intraperitoneal gas or abnormal air-fluid levels. IMPRESSION: 1. Unremarkable bowel gas pattern. 2. Indistinct left basilar airspace opacity potentially from atelectasis or pneumonia. Electronically Signed   By: WVan ClinesM.D.   On: 12/07/2020 16:11   ECHOCARDIOGRAM COMPLETE  Result Date: 12/06/2020    ECHOCARDIOGRAM REPORT   Patient Name:   Larry PIKEDate of Exam: 12/06/2020 Medical Rec #:  [id number]      Height:       66.0 in Accession #:    [id number]     Weight:       150.0 lb Date of Birth:  321-Apr-1967       BSA:          1.770  m Patient Age:    65 years        BP:           150/90 mmHg Patient Gender: M               HR:           86 bpm. Exam Location:  [facility] Procedure: 2D Echo, Color Doppler, Cardiac Doppler and Strain Analysis Indications:     Chest pain R07.9  History:         Patient has no prior history of Echocardiogram examinations.                  Risk Factors:Hypertension. CKD.  Sonographer:     Sherrie Sport RDCS (AE) Referring Phys:  [id number] Athena Masse Diagnosing Phys: Ida Rogue MD  Sonographer Comments: Global longitudinal strain was attempted. IMPRESSIONS  1. Left ventricular ejection fraction, by estimation, is 55 to 60%. The left ventricle has normal function. The left ventricle has no regional wall motion abnormalities. There is moderate left ventricular hypertrophy. Left ventricular diastolic parameters are consistent with Grade II diastolic dysfunction (pseudonormalization). The average left ventricular global longitudinal strain is -9.4 %. The global longitudinal strain is abnormal.  2. Right ventricular systolic function is normal. The right ventricular size is normal. FINDINGS  Left Ventricle: Left ventricular ejection fraction, by estimation, is 55 to 60%. The left ventricle has normal function. The left ventricle has no regional wall motion abnormalities. The average left ventricular global longitudinal strain is -9.4 %. The  global  longitudinal strain is abnormal. The left ventricular internal cavity size was normal in size. There is moderate left ventricular hypertrophy. Left ventricular diastolic parameters are consistent with Grade II diastolic dysfunction (pseudonormalization). Right Ventricle: The right ventricular size is normal. No increase in right ventricular wall thickness. Right ventricular systolic function is normal. Left Atrium: Left atrial size was normal in size. Right Atrium: Right atrial size was normal in size. Pericardium: There is no evidence of pericardial effusion. Mitral Valve: The mitral valve is normal in structure. No evidence of mitral valve regurgitation. No evidence of mitral valve stenosis. Tricuspid Valve: The tricuspid valve is normal in structure. Tricuspid valve regurgitation is mild . No evidence of tricuspid stenosis. Aortic Valve: The aortic valve was not well visualized. Aortic valve regurgitation is not visualized. No aortic stenosis is present. Aortic valve mean gradient measures 6.0 mmHg. Aortic valve peak gradient measures 11.7 mmHg. Aortic valve area, by VTI measures 3.30 cm. Pulmonic Valve: The pulmonic valve was normal in structure. Pulmonic valve regurgitation is not visualized. No evidence of pulmonic stenosis. Aorta: The aortic root is normal in size and structure. Venous: The inferior vena cava is normal in size with greater than 50% respiratory variability, suggesting right atrial pressure of 3 mmHg. IAS/Shunts: No atrial level shunt detected by color flow Doppler.  LEFT VENTRICLE PLAX 2D LVIDd:         4.35 cm  Diastology LVIDs:         2.43 cm  LV e' medial:    6.20 cm/s LV PW:         1.45 cm  LV E/e' medial:  14.9 LV IVS:        1.08 cm  LV e' lateral:   6.64 cm/s LVOT diam:     2.00 cm  LV E/e' lateral: 13.9 LV SV:         78 LV SV Index:   44  2D Longitudinal Strain LVOT Area:     3.14 cm 2D Strain GLS Avg:     -9.4 %                          3D Volume EF:                          3D EF:        53 %                         LV EDV:       193 ml                         LV ESV:       91 ml                         LV SV:        102 ml RIGHT VENTRICLE RV Basal diam:  3.96 cm RV S prime:     20.20 cm/s TAPSE (M-mode): 4.0 cm LEFT ATRIUM             Index       RIGHT ATRIUM           Index LA diam:        3.80 cm 2.15 cm/m  RA Area:     17.80 cm LA Vol (A2C):   98.9 ml 55.89 ml/m RA Volume:   53.60 ml  30.29 ml/m LA Vol (A4C):   92.5 ml 52.27 ml/m LA Biplane Vol: 97.1 ml 54.87 ml/m  AORTIC VALVE                    PULMONIC VALVE AV Area (Vmax):    3.10 cm     PV Vmax:        1.10 m/s AV Area (Vmean):   2.97 cm     PV Peak grad:   4.8 mmHg AV Area (VTI):     3.30 cm     RVOT Peak grad: 5 mmHg AV Vmax:           171.00 cm/s AV Vmean:          113.000 cm/s AV VTI:            0.235 m AV Peak Grad:      11.7 mmHg AV Mean Grad:      6.0 mmHg LVOT Vmax:         169.00 cm/s LVOT Vmean:        107.000 cm/s LVOT VTI:          0.247 m LVOT/AV VTI ratio: 1.05  AORTA Ao Root diam: 3.20 cm MITRAL VALVE               TRICUSPID VALVE MV Area (PHT): 3.40 cm    TR Peak grad:   18.7 mmHg MV Decel Time: 223 msec    TR Vmax:        216.00 cm/s MV E velocity: 92.10 cm/s MV A velocity: 71.60 cm/s  SHUNTS MV E/A ratio:  1.29        Systemic VTI:  0.25 m                            Systemic  Diam: 2.00 cm Ida Rogue MD Electronically signed by Ida Rogue MD Signature Date/Time: 12/06/2020/3:31:49 PM    Final      The results of significant diagnostics from this hospitalization (including imaging, microbiology, ancillary and laboratory) are listed below for reference.     Microbiology: Recent Results (from the past 240 hour(s))  Resp Panel by RT-PCR (Flu A&B, Covid) Nasopharyngeal Swab     Status: None   Collection Time: 12/06/20  1:49 AM   Specimen: Nasopharyngeal Swab; Nasopharyngeal(NP) swabs in vial transport medium  Result Value Ref Range Status   SARS Coronavirus 2 by RT PCR NEGATIVE NEGATIVE  Final    Comment: (NOTE) SARS-CoV-2 target nucleic acids are NOT DETECTED.  The SARS-CoV-2 RNA is generally detectable in upper respiratory specimens during the acute phase of infection. The lowest concentration of SARS-CoV-2 viral copies this assay can detect is 138 copies/mL. A negative result does not preclude SARS-Cov-2 infection and should not be used as the sole basis for treatment or other patient management decisions. A negative result may occur with  improper specimen collection/handling, submission of specimen other than nasopharyngeal swab, presence of viral mutation(s) within the areas targeted by this assay, and inadequate number of viral copies(<138 copies/mL). A negative result must be combined with clinical observations, patient history, and epidemiological information. The expected result is Negative.  Fact Sheet for Patients:  EntrepreneurPulse.com.au  Fact Sheet for Healthcare Providers:  IncredibleEmployment.be  This test is no t yet approved or cleared by the [place] FDA and  has been authorized for detection and/or diagnosis of SARS-CoV-2 by FDA under an Emergency Use Authorization (EUA). This EUA will remain  in effect (meaning this test can be used) for the duration of the COVID-19 declaration under Section 564(b)(1) of the Act, 21 U.S.C.section 360bbb-3(b)(1), unless the authorization is terminated  or revoked sooner.       Influenza A by PCR NEGATIVE NEGATIVE Final   Influenza B by PCR NEGATIVE NEGATIVE Final    Comment: (NOTE) The Xpert Xpress SARS-CoV-2/FLU/RSV plus assay is intended as an aid in the diagnosis of influenza from Nasopharyngeal swab specimens and should not be used as a sole basis for treatment. Nasal washings and aspirates are unacceptable for Xpert Xpress SARS-CoV-2/FLU/RSV testing.  Fact Sheet for Patients: EntrepreneurPulse.com.au  Fact Sheet for Healthcare  Providers: IncredibleEmployment.be  This test is not yet approved or cleared by the [place] FDA and has been authorized for detection and/or diagnosis of SARS-CoV-2 by FDA under an Emergency Use Authorization (EUA). This EUA will remain in effect (meaning this test can be used) for the duration of the COVID-19 declaration under Section 564(b)(1) of the Act, 21 U.S.C. section 360bbb-3(b)(1), unless the authorization is terminated or revoked.  Performed at [facility], [address] [place], [place] [address]      Labs: BNP (last 3 results) No results for input(s): BNP in the last 8760 hours. Basic Metabolic Panel: Recent Labs  Lab 12/06/20 0019 12/06/20 0520 12/08/20 0449  NA 134*  --  132*  K 4.2  --  4.5  CL 105  --  99  CO2 9*  --  14*  GLUCOSE 128*  --  113*  BUN 139*  --  125*  CREATININE 23.62* 22.99* 19.42*  CALCIUM 7.0*  --  8.1*  MG  --   --  2.1  PHOS  --   --  11.5*   Liver Function Tests: Recent Labs  Lab 12/06/20 0019 12/08/20 0449  AST 13*  --   ALT 11  --   ALKPHOS 59  --   BILITOT 0.8  --   PROT 6.0*  --   ALBUMIN 3.2* 2.6*   Recent Labs  Lab 12/06/20 0019  LIPASE 31   No results for input(s): AMMONIA in the last 168 hours. CBC: Recent Labs  Lab 12/06/20 0019 12/06/20 0520  WBC 14.1* 16.0*  NEUTROABS 10.7*  --   HGB 9.5* 9.1*  HCT 28.7* 28.3*  MCV 91.4 93.4  PLT 242 239   Cardiac Enzymes: No results for input(s): CKTOTAL, CKMB, CKMBINDEX, TROPONINI in the last 168 hours. BNP: Invalid input(s): POCBNP CBG: No results for input(s): GLUCAP in the last 168 hours. D-Dimer No results for input(s): DDIMER in the last 72 hours. Hgb A1c No results for input(s): HGBA1C in the last 72 hours. Lipid Profile No results for input(s): CHOL, HDL, LDLCALC, TRIG, CHOLHDL, LDLDIRECT in the last 72 hours. Thyroid function studies Recent Labs    12/08/20 0449  TSH 1.151   Anemia work up No results for  input(s): VITAMINB12, FOLATE, FERRITIN, TIBC, IRON, RETICCTPCT in the last 72 hours. Urinalysis    Component Value Date/Time   COLORURINE YELLOW (A) 12/14/2018 1613   APPEARANCEUR CLEAR (A) 12/14/2018 1613   APPEARANCEUR Clear 10/15/2017 1512   LABSPEC 1.017 12/14/2018 1613   LABSPEC 1.011 01/18/2015 1423   PHURINE 7.0 12/14/2018 1613   GLUCOSEU 150 (A) 12/14/2018 1613   GLUCOSEU >=500 01/18/2015 1423   HGBUR SMALL (A) 12/14/2018 1613   BILIRUBINUR NEGATIVE 12/14/2018 1613   BILIRUBINUR Negative 10/15/2017 1512   BILIRUBINUR Negative 01/18/2015 1423   KETONESUR 5 (A) 12/14/2018 1613   PROTEINUR >=300 (A) 12/14/2018 1613   NITRITE NEGATIVE 12/14/2018 1613   LEUKOCYTESUR NEGATIVE 12/14/2018 1613   LEUKOCYTESUR Negative 01/18/2015 1423   Sepsis Labs Invalid input(s): PROCALCITONIN,  WBC,  LACTICIDVEN Microbiology Recent Results (from the past 240 hour(s))  Resp Panel by RT-PCR (Flu A&B, Covid) Nasopharyngeal Swab     Status: None   Collection Time: 12/06/20  1:49 AM   Specimen: Nasopharyngeal Swab; Nasopharyngeal(NP) swabs in vial transport medium  Result Value Ref Range Status   SARS Coronavirus 2 by RT PCR NEGATIVE NEGATIVE Final    Comment: (NOTE) SARS-CoV-2 target nucleic acids are NOT DETECTED.  The SARS-CoV-2 RNA is generally detectable in upper respiratory specimens during the acute phase of infection. The lowest concentration of SARS-CoV-2 viral copies this assay can detect is 138 copies/mL. A negative result does not preclude SARS-Cov-2 infection and should not be used as the sole basis for treatment or other patient management decisions. A negative result may occur with  improper specimen collection/handling, submission of specimen other than nasopharyngeal swab, presence of viral mutation(s) within the areas targeted by this assay, and inadequate number of viral copies(<138 copies/mL). A negative result must be combined with clinical observations, patient history,  and epidemiological information. The expected result is Negative.  Fact Sheet for Patients:  EntrepreneurPulse.com.au  Fact Sheet for Healthcare Providers:  IncredibleEmployment.be  This test is no t yet approved or cleared by the [place] FDA and  has been authorized for detection and/or diagnosis of SARS-CoV-2 by FDA under an Emergency Use Authorization (EUA). This EUA will remain  in effect (meaning this test can be used) for the duration of the COVID-19 declaration under Section 564(b)(1) of the Act, 21 U.S.C.section 360bbb-3(b)(1), unless the authorization is terminated  or revoked sooner.       Influenza A  by PCR NEGATIVE NEGATIVE Final   Influenza B by PCR NEGATIVE NEGATIVE Final    Comment: (NOTE) The Xpert Xpress SARS-CoV-2/FLU/RSV plus assay is intended as an aid in the diagnosis of influenza from Nasopharyngeal swab specimens and should not be used as a sole basis for treatment. Nasal washings and aspirates are unacceptable for Xpert Xpress SARS-CoV-2/FLU/RSV testing.  Fact Sheet for Patients: EntrepreneurPulse.com.au  Fact Sheet for Healthcare Providers: IncredibleEmployment.be  This test is not yet approved or cleared by the [place] FDA and has been authorized for detection and/or diagnosis of SARS-CoV-2 by FDA under an Emergency Use Authorization (EUA). This EUA will remain in effect (meaning this test can be used) for the duration of the COVID-19 declaration under Section 564(b)(1) of the Act, 21 U.S.C. section 360bbb-3(b)(1), unless the authorization is terminated or revoked.  Performed at [facility], [address] [place], [place] [address]      Time coordinating discharge in minutes: 65  SIGNED:   Debbe Odea, MD  [facility] 12/09/2020, 2:33 PM

## 2020-12-10 ENCOUNTER — Telehealth: Payer: Self-pay | Admitting: Physician Assistant

## 2020-12-10 ENCOUNTER — Ambulatory Visit (INDEPENDENT_AMBULATORY_CARE_PROVIDER_SITE_OTHER): Payer: Medicare Other

## 2020-12-10 ENCOUNTER — Encounter: Payer: Self-pay | Admitting: Vascular Surgery

## 2020-12-10 DIAGNOSIS — I480 Paroxysmal atrial fibrillation: Secondary | ICD-10-CM

## 2020-12-10 LAB — HEPATITIS B SURFACE ANTIBODY, QUANTITATIVE: Hep B S AB Quant (Post): 13.1 m[IU]/mL (ref >9.9)

## 2020-12-10 NOTE — Telephone Encounter (Signed)
Left voicemail message to call back to schedule appointment for next month around April 25th with Dr. Rockey Situ or App here in office.

## 2020-12-10 NOTE — Telephone Encounter (Signed)
Spoke with patient and reviewed provider recommendations for him to wear a heart monitor. Advised of the precautions while wearing monitor and that it would be mailed to him. Discussed that it should have step by step instructions but if he should have any questions to please give [place] a call. Also reviewed that we want to schedule hospital follow up appointment once he has worn the monitor for 2 weeks. He verbalized understanding, agreeable with monitor, and that I would have scheduling reach out to him for follow up once that has been worn. He was appreciative for the call with no further concerns at this time.

## 2020-12-10 NOTE — Telephone Encounter (Signed)
Left voicemail message to call back for review of monitor.

## 2020-12-10 NOTE — Telephone Encounter (Signed)
LVM for patient to call back and schedule

## 2020-12-12 DIAGNOSIS — I480 Paroxysmal atrial fibrillation: Secondary | ICD-10-CM

## 2020-12-12 NOTE — Telephone Encounter (Signed)
Left voicemail message to call back so we can schedule appointment in April.

## 2020-12-13 NOTE — Telephone Encounter (Signed)
Spoke with patient and scheduled him follow up appointment for January 16, 2021 at 09:40 AM with Dr. Rockey Situ and his monitor results should be back by then. He verbalized understanding, confirmed appointment, and location. He received monitor and has it currently on. He was appreciative for the call and assisting with his follow up appointment. No further questions at this time.

## 2020-12-20 DIAGNOSIS — N186 End stage renal disease: Secondary | ICD-10-CM | POA: Diagnosis not present

## 2020-12-20 DIAGNOSIS — Z992 Dependence on renal dialysis: Secondary | ICD-10-CM | POA: Diagnosis not present

## 2021-01-15 NOTE — Progress Notes (Signed)
Cardiology Office Note  Date:  01/16/2021   ID:  Larry [name], DOB December 05, 1965, MRN [id number]  PCP:  Gillis Santa, MD   Chief Complaint  Patient presents with  . [facility] follow up/[facility] results.     "doing well." Medications reviewed by the patient verbally. Patient has not started the Amiodarone that was prescribed to him at discharge due to the pharmacy discussed a concern to him regarding the dosage.     HPI:  55 year old gentleman with history of  end-stage renal disease on peritoneal dialysis,  COPD,  hypertension presenting with chest pain.  March 2022 Acute pericarditis, treated with prednisone Elevated troponin from demand ischemia Paroxysmal atrial fibrillation , brief episode associated with hemodialysis  Treated with amiodarone Smoker Who presents to establish care in the [place] office, follow-up of his chest pain and pericarditis  In follow-up today reports that he did not pick up the amiodarone Reports that he was told by the pharmacist that it was a toxic dose and he did not pick it up  Checks his blood pressure at night Typically BP 130/80 -140 Did not take his medications today, blood pressure 170/108 Recently started on amlodipine 5, has not started yet  Denies any chest pain, is not taking NSAIDs He is not on losartan, he is not taking Lipitor  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  PSH:    Past Surgical History:  Procedure Laterality Date  . CAPD INSERTION N/A 01/06/2018   Procedure: LAPAROSCOPIC INSERTION CONTINUOUS AMBULATORY PERITONEAL DIALYSIS  (CAPD) CATHETER;  Surgeon: Katha Cabal, MD;  Location: [facility] ORS;  Service: Vascular;  Laterality: N/A;  . CHOLECYSTECTOMY    .  CYSTOSCOPY W/ URETERAL STENT REMOVAL Right 10/23/2017   Procedure: CYSTOSCOPY WITH STENT REPLACEMENT;  Surgeon: [name], MD;  Location: [facility];  Service: Urology;  Laterality: Right;  . CYSTOSCOPY WITH STENT PLACEMENT Right 09/30/2017   Procedure: CYSTOSCOPY WITH STENT PLACEMENT;  Surgeon: [name], MD;  Location: [facility];  Service: Urology;  Laterality: Right;  . CYSTOSCOPY/RETROGRADE/URETEROSCOPY Right 10/23/2017   Procedure: CYSTOSCOPY/RETROGRADE/URETEROSCOPY;  Surgeon: [name], MD;  Location: [facility];  Service: Urology;  Laterality: Right;  . RENAL BIOPSY    . TEMPORARY DIALYSIS CATHETER N/A 12/07/2020   Procedure: TEMPORARY DIALYSIS CATHETER;  Surgeon: Katha Cabal, MD;  Location: [facility];  Service: Cardiovascular;  Laterality: N/A;    Current Outpatient Medications  Medication Sig Dispense Refill  . acetaminophen (TYLENOL) 500 MG tablet Take by mouth.    Marland Kitchen amLODipine (NORVASC) 5 MG tablet Take 5 mg by mouth daily.    Marland Kitchen atorvastatin (LIPITOR) 40 MG tablet Take 1 tablet (40 mg total) by mouth daily. 30 tablet 0  . calcium acetate (PHOSLO) 667 MG capsule Take 1,334 mg by mouth 3 (three) times daily.    . famotidine (PEPCID) 20 MG tablet Take 20 mg by mouth 2 (two) times daily as needed for heartburn or indigestion.    . hydrALAZINE (APRESOLINE) 50 MG tablet Take 50 mg by mouth daily as needed (If diastolic bp is 90 or above).     Marland Kitchen ibuprofen (ADVIL,MOTRIN) 200 MG tablet Take 400 mg by mouth daily as needed for headache  or moderate pain.    . methylPREDNISolone (MEDROL DOSEPAK) 4 MG TBPK tablet Day 1 (day of discharge): Take one tablet after supper and two tablets at bedtime. Day 2:Take one tablet before breakfast, after lunch, after supper, and at bedtime. Day 3: take one tablet before breakfast, after lunch, at bedtime. Day 4: Take one tablet before breakfast and at bedtime. Day 5: take one tablet before breakfast. 13 tablet 0  . metoprolol succinate  (TOPROL-XL) 100 MG 24 hr tablet Take 100 mg by mouth daily.    . nicotine (NICODERM CQ - DOSED IN MG/24 HOURS) 14 mg/24hr patch Place 14 mg onto the skin daily.    . ondansetron (ZOFRAN-ODT) 4 MG disintegrating tablet Take 4 mg by mouth every 8 (eight) hours as needed.    . sertraline (ZOLOFT) 25 MG tablet Take 25 mg by mouth daily.   3  . sodium bicarbonate 650 MG tablet Take 1,300 mg by mouth 2 (two) times daily.     Marland Kitchen amiodarone (PACERONE) 400 MG tablet Take 1 tablet (400 mg total) by mouth 2 (two) times daily. (Patient not taking: Reported on 01/16/2021) 60 tablet 0  . oxybutynin (DITROPAN) 5 MG tablet Take 5 mg by mouth 2 (two) times daily. (Patient not taking: Reported on 01/16/2021)     No current facility-administered medications for this visit.   Facility-Administered Medications Ordered in Other Visits  Medication Dose Route Frequency Provider Last Rate Last Admin  . 0.9 %  sodium chloride infusion   Intravenous Continuous [name], MD 10 mL/hr at 06/14/15 1412 New Bag at 06/14/15 1412     Allergies:   Mushroom extract complex and Amoxicillin   Social History:  The patient  reports that he has been smoking cigarettes. He has a 20.00 pack-year smoking history. He has never used smokeless tobacco. He reports current alcohol use. He reports that he does not use drugs.   Family History:   family history includes CAD in his mother and sister; Diabetes in his mother and sister; Kidney disease in his mother.    Review of Systems: Review of Systems  Constitutional: Negative.   HENT: Negative.   Respiratory: Negative.   Cardiovascular: Negative.   Gastrointestinal: Negative.   Musculoskeletal: Negative.   Neurological: Negative.   Psychiatric/Behavioral: Negative.   All other systems reviewed and are negative.    PHYSICAL EXAM: VS:  BP (!) 170/108 (BP Location: Left Arm, Patient Position: Sitting, Cuff Size: Normal)   Pulse 90   Ht '5\' 6"'$  (1.676 m)   Wt 144 lb 2 oz  (65.4 kg)   SpO2 99%   BMI 23.26 kg/m  , BMI Body mass index is 23.26 kg/m. GEN: Well nourished, well developed, in no acute distress HEENT: normal Neck: no JVD, carotid bruits, or masses Cardiac: RRR; no murmurs, rubs, or gallops,no edema  Respiratory:  clear to auscultation bilaterally, normal work of breathing GI: soft, nontender, nondistended, + BS MS: no deformity or atrophy Skin: warm and dry, no rash Neuro:  Strength and sensation are intact Psych: euthymic mood, full affect   Recent Labs: 12/06/2020: ALT 11; Hemoglobin 9.1; Platelets 239 12/08/2020: BUN 125; Creatinine, Ser 19.42; Magnesium 2.1; Potassium 4.5; Sodium 132; TSH 1.151    Lipid Panel Lab Results  Component Value Date   CHOL 140 12/06/2020   HDL 27 (L) 12/06/2020   LDLCALC 82 12/06/2020   TRIG 154 (H) 12/06/2020      Wt Readings from Last 3 Encounters:  01/16/21 144  lb 2 oz (65.4 kg)  12/09/20 153 lb (69.4 kg)  10/29/20 149 lb (67.6 kg)       ASSESSMENT AND PLAN:  Problem List Items Addressed This Visit      Cardiology Problems   Paroxysmal atrial fibrillation (HCC) - Primary   Relevant Medications   amLODipine (NORVASC) 5 MG tablet   Pericarditis   Relevant Medications   amLODipine (NORVASC) 5 MG tablet   HTN (hypertension)   Relevant Medications   amLODipine (NORVASC) 5 MG tablet     Other   ESRD on peritoneal dialysis [facility])      Essential hypertension Recommend he continue to take metoprolol succinate 100 daily Amlodipine increased up to 10 milligrams daily given high blood pressure Will make hydralazine as needed as prescribed  Paroxysmal atrial fibrillation He prefers to continue metoprolol, does not want anticoagulation or amiodarone Discussed plan if he has recurrent episodes, could take extra metoprolol half dose, call our office  Pericarditis  denies any chest pain, not on NSAIDs Feel symptoms were from bronchitis Discussed that he should take ibuprofen for recurrent  symptoms    Total encounter time more than 25 minutes  Greater than 50% was spent in counseling and coordination of care with the patient    Signed, Esmond Plants, M.D., Ph.D. [place], [place]

## 2021-01-16 ENCOUNTER — Encounter: Payer: Self-pay | Admitting: Cardiovascular Disease

## 2021-01-16 ENCOUNTER — Ambulatory Visit (INDEPENDENT_AMBULATORY_CARE_PROVIDER_SITE_OTHER): Payer: Medicare Other | Admitting: Cardiovascular Disease

## 2021-01-16 ENCOUNTER — Other Ambulatory Visit: Payer: Self-pay

## 2021-01-16 VITALS — BP 170/108 | HR 90 | Ht 66.0 in | Wt 144.1 lb

## 2021-01-16 DIAGNOSIS — I480 Paroxysmal atrial fibrillation: Secondary | ICD-10-CM

## 2021-01-16 DIAGNOSIS — I309 Acute pericarditis, unspecified: Secondary | ICD-10-CM | POA: Diagnosis not present

## 2021-01-16 DIAGNOSIS — Z992 Dependence on renal dialysis: Secondary | ICD-10-CM

## 2021-01-16 DIAGNOSIS — I10 Essential (primary) hypertension: Secondary | ICD-10-CM

## 2021-01-16 DIAGNOSIS — N186 End stage renal disease: Secondary | ICD-10-CM

## 2021-01-16 MED ORDER — METOPROLOL SUCCINATE ER 100 MG PO TB24: 100.0000 mg | ORAL_TABLET | Freq: Every day | ORAL | 3 refills | Status: DC

## 2021-01-16 MED ORDER — AMLODIPINE BESYLATE 10 MG PO TABS: 10.0000 mg | ORAL_TABLET | Freq: Every day | ORAL | 3 refills | Status: DC

## 2021-01-16 NOTE — Patient Instructions (Addendum)
Medication Instructions:  Please increase the amlodipine up to 10 mg daily Stay on metoprolol succinate 100 mg daily   If you need a refill on your cardiac medications before your next appointment, please call your pharmacy.    Lab work: No new labs needed   If you have labs (blood work) drawn today and your tests are completely normal, you will receive your results only by: Marland Kitchen MyChart Message (if you have MyChart) OR . A paper copy in the mail If you have any lab test that is abnormal or we need to change your treatment, we will call you to review the results.   Testing/Procedures: No new testing needed   Follow-Up: At [facility], you and your health needs are our priority.  As part of our continuing mission to provide you with exceptional heart care, we have created designated Provider Care Teams.  These Care Teams include your primary Cardiologist (physician) and Advanced Practice Providers (APPs -  Physician Assistants and Nurse Practitioners) who all work together to provide you with the care you need, when you need it.  . You will need a follow up appointment in 6 months  . Providers on your designated Care Team:   . Murray Hodgkins, NP . Christell Faith, PA-C . Marrianne Mood, PA-C  Any Other Special Instructions Will Be Listed Below (If Applicable).  COVID-19 Vaccine Information can be found at: ShippingScam.co.uk For questions related to vaccine distribution or appointments, please email [email or web address] or call [phone])[phone].

## 2021-01-19 DIAGNOSIS — Z992 Dependence on renal dialysis: Secondary | ICD-10-CM | POA: Diagnosis not present

## 2021-01-19 DIAGNOSIS — N186 End stage renal disease: Secondary | ICD-10-CM | POA: Diagnosis not present

## 2021-02-19 ENCOUNTER — Telehealth: Payer: Self-pay

## 2021-02-19 DIAGNOSIS — N186 End stage renal disease: Secondary | ICD-10-CM | POA: Diagnosis not present

## 2021-02-19 DIAGNOSIS — Z992 Dependence on renal dialysis: Secondary | ICD-10-CM | POA: Diagnosis not present

## 2021-02-19 NOTE — Telephone Encounter (Signed)
This is not a [facility] please route accordingly   Copied from [place] [id number]. Topic: Appointment Scheduling - Scheduling Inquiry for Clinic >> Feb 19, 2021  9:35 AM Bayard Beaver wrote: Reason for CRM: pt asking for earlier appt for ear pain, earliest I have is June 20. Please advise >> Feb 19, 2021  9:41 AM Bayard Beaver wrote: Pt would like a sooner new patient appt then June 20 for ear pain, going on for a week.

## 2021-02-28 DIAGNOSIS — R070 Pain in throat: Secondary | ICD-10-CM | POA: Diagnosis not present

## 2021-02-28 DIAGNOSIS — H90A32 Mixed conductive and sensorineural hearing loss, unilateral, left ear with restricted hearing on the contralateral side: Secondary | ICD-10-CM | POA: Diagnosis not present

## 2021-02-28 DIAGNOSIS — H6532 Chronic mucoid otitis media, left ear: Secondary | ICD-10-CM | POA: Diagnosis not present

## 2021-03-06 ENCOUNTER — Other Ambulatory Visit: Payer: Self-pay

## 2021-03-06 ENCOUNTER — Emergency Department: Payer: Medicare Other

## 2021-03-06 ENCOUNTER — Inpatient Hospital Stay
Admission: EM | Admit: 2021-03-06 | Discharge: 2021-03-12 | DRG: 871 | Disposition: A | Discharge status: Home / Self Care | Payer: Medicare Other | Attending: Internal Medicine | Admitting: Internal Medicine

## 2021-03-06 DIAGNOSIS — N049 Nephrotic syndrome with unspecified morphologic changes: Secondary | ICD-10-CM | POA: Diagnosis present

## 2021-03-06 DIAGNOSIS — D649 Anemia, unspecified: Secondary | ICD-10-CM | POA: Diagnosis not present

## 2021-03-06 DIAGNOSIS — E875 Hyperkalemia: Secondary | ICD-10-CM | POA: Diagnosis not present

## 2021-03-06 DIAGNOSIS — D631 Anemia in chronic kidney disease: Secondary | ICD-10-CM

## 2021-03-06 DIAGNOSIS — K449 Diaphragmatic hernia without obstruction or gangrene: Secondary | ICD-10-CM | POA: Diagnosis not present

## 2021-03-06 DIAGNOSIS — K2970 Gastritis, unspecified, without bleeding: Secondary | ICD-10-CM | POA: Diagnosis present

## 2021-03-06 DIAGNOSIS — Z880 Allergy status to penicillin: Secondary | ICD-10-CM

## 2021-03-06 DIAGNOSIS — Z91018 Allergy to other foods: Secondary | ICD-10-CM

## 2021-03-06 DIAGNOSIS — Z79899 Other long term (current) drug therapy: Secondary | ICD-10-CM

## 2021-03-06 DIAGNOSIS — F17210 Nicotine dependence, cigarettes, uncomplicated: Secondary | ICD-10-CM | POA: Diagnosis present

## 2021-03-06 DIAGNOSIS — R918 Other nonspecific abnormal finding of lung field: Secondary | ICD-10-CM | POA: Diagnosis not present

## 2021-03-06 DIAGNOSIS — I120 Hypertensive chronic kidney disease with stage 5 chronic kidney disease or end stage renal disease: Secondary | ICD-10-CM | POA: Diagnosis present

## 2021-03-06 DIAGNOSIS — J811 Chronic pulmonary edema: Secondary | ICD-10-CM | POA: Diagnosis present

## 2021-03-06 DIAGNOSIS — Z9119 Patient's noncompliance with other medical treatment and regimen: Secondary | ICD-10-CM

## 2021-03-06 DIAGNOSIS — Z9049 Acquired absence of other specified parts of digestive tract: Secondary | ICD-10-CM | POA: Diagnosis not present

## 2021-03-06 DIAGNOSIS — R110 Nausea: Secondary | ICD-10-CM | POA: Diagnosis not present

## 2021-03-06 DIAGNOSIS — J069 Acute upper respiratory infection, unspecified: Secondary | ICD-10-CM | POA: Diagnosis present

## 2021-03-06 DIAGNOSIS — N186 End stage renal disease: Secondary | ICD-10-CM

## 2021-03-06 DIAGNOSIS — K2100 Gastro-esophageal reflux disease with esophagitis, without bleeding: Secondary | ICD-10-CM | POA: Diagnosis present

## 2021-03-06 DIAGNOSIS — Z992 Dependence on renal dialysis: Secondary | ICD-10-CM

## 2021-03-06 DIAGNOSIS — Y95 Nosocomial condition: Secondary | ICD-10-CM | POA: Diagnosis present

## 2021-03-06 DIAGNOSIS — J189 Pneumonia, unspecified organism: Secondary | ICD-10-CM | POA: Diagnosis not present

## 2021-03-06 DIAGNOSIS — A419 Sepsis, unspecified organism: Principal | ICD-10-CM

## 2021-03-06 DIAGNOSIS — K635 Polyp of colon: Secondary | ICD-10-CM | POA: Diagnosis present

## 2021-03-06 DIAGNOSIS — I32 Pericarditis in diseases classified elsewhere: Secondary | ICD-10-CM | POA: Diagnosis not present

## 2021-03-06 DIAGNOSIS — R9431 Abnormal electrocardiogram [ECG] [EKG]: Secondary | ICD-10-CM | POA: Diagnosis not present

## 2021-03-06 DIAGNOSIS — K219 Gastro-esophageal reflux disease without esophagitis: Secondary | ICD-10-CM | POA: Diagnosis present

## 2021-03-06 DIAGNOSIS — I480 Paroxysmal atrial fibrillation: Secondary | ICD-10-CM | POA: Diagnosis not present

## 2021-03-06 DIAGNOSIS — Z8249 Family history of ischemic heart disease and other diseases of the circulatory system: Secondary | ICD-10-CM

## 2021-03-06 DIAGNOSIS — F17200 Nicotine dependence, unspecified, uncomplicated: Secondary | ICD-10-CM | POA: Diagnosis present

## 2021-03-06 DIAGNOSIS — R1111 Vomiting without nausea: Secondary | ICD-10-CM | POA: Diagnosis not present

## 2021-03-06 DIAGNOSIS — K2960 Other gastritis without bleeding: Secondary | ICD-10-CM | POA: Diagnosis not present

## 2021-03-06 DIAGNOSIS — N19 Unspecified kidney failure: Secondary | ICD-10-CM | POA: Diagnosis present

## 2021-03-06 DIAGNOSIS — Z87441 Personal history of nephrotic syndrome: Secondary | ICD-10-CM | POA: Diagnosis not present

## 2021-03-06 DIAGNOSIS — R531 Weakness: Secondary | ICD-10-CM | POA: Diagnosis not present

## 2021-03-06 DIAGNOSIS — D62 Acute posthemorrhagic anemia: Secondary | ICD-10-CM | POA: Diagnosis not present

## 2021-03-06 DIAGNOSIS — F32A Depression, unspecified: Secondary | ICD-10-CM | POA: Diagnosis present

## 2021-03-06 DIAGNOSIS — E876 Hypokalemia: Secondary | ICD-10-CM | POA: Diagnosis present

## 2021-03-06 DIAGNOSIS — K9289 Other specified diseases of the digestive system: Secondary | ICD-10-CM | POA: Diagnosis not present

## 2021-03-06 DIAGNOSIS — I10 Essential (primary) hypertension: Secondary | ICD-10-CM

## 2021-03-06 DIAGNOSIS — R4182 Altered mental status, unspecified: Secondary | ICD-10-CM | POA: Diagnosis present

## 2021-03-06 DIAGNOSIS — Z833 Family history of diabetes mellitus: Secondary | ICD-10-CM | POA: Diagnosis not present

## 2021-03-06 DIAGNOSIS — Z20822 Contact with and (suspected) exposure to covid-19: Secondary | ICD-10-CM | POA: Diagnosis not present

## 2021-03-06 DIAGNOSIS — N2581 Secondary hyperparathyroidism of renal origin: Secondary | ICD-10-CM | POA: Diagnosis not present

## 2021-03-06 DIAGNOSIS — K208 Other esophagitis without bleeding: Secondary | ICD-10-CM | POA: Diagnosis not present

## 2021-03-06 DIAGNOSIS — R509 Fever, unspecified: Secondary | ICD-10-CM | POA: Diagnosis not present

## 2021-03-06 DIAGNOSIS — K209 Esophagitis, unspecified without bleeding: Secondary | ICD-10-CM | POA: Diagnosis not present

## 2021-03-06 DIAGNOSIS — K920 Hematemesis: Secondary | ICD-10-CM

## 2021-03-06 DIAGNOSIS — Z841 Family history of disorders of kidney and ureter: Secondary | ICD-10-CM | POA: Diagnosis not present

## 2021-03-06 DIAGNOSIS — K621 Rectal polyp: Secondary | ICD-10-CM | POA: Diagnosis present

## 2021-03-06 DIAGNOSIS — R58 Hemorrhage, not elsewhere classified: Secondary | ICD-10-CM | POA: Diagnosis not present

## 2021-03-06 LAB — CBC
HCT: 25.8 % — ABNORMAL LOW (ref 39.0–52.0)
HCT: 23.7 % — ABNORMAL LOW (ref 39.0–52.0)
HCT: 26.9 % — ABNORMAL LOW (ref 39.0–52.0)
Hemoglobin: 8.5 g/dL — ABNORMAL LOW (ref 13.0–17.0)
Hemoglobin: 7.9 g/dL — ABNORMAL LOW (ref 13.0–17.0)
Hemoglobin: 8.4 g/dL — ABNORMAL LOW (ref 13.0–17.0)
MCH: 31.6 pg (ref 26.0–34.0)
MCH: 31 pg (ref 26.0–34.0)
MCH: 30.9 pg (ref 26.0–34.0)
MCHC: 32.9 g/dL (ref 30.0–36.0)
MCHC: 33.3 g/dL (ref 30.0–36.0)
MCHC: 31.2 g/dL (ref 30.0–36.0)
MCV: 95.9 fL (ref 80.0–100.0)
MCV: 92.9 fL (ref 80.0–100.0)
MCV: 98.9 fL (ref 80.0–100.0)
Platelets: 210 10*3/uL (ref 150–400)
Platelets: 184 10*3/uL (ref 150–400)
Platelets: 182 10*3/uL (ref 150–400)
RBC: 2.69 MIL/uL — ABNORMAL LOW (ref 4.22–5.81)
RBC: 2.55 MIL/uL — ABNORMAL LOW (ref 4.22–5.81)
RBC: 2.72 MIL/uL — ABNORMAL LOW (ref 4.22–5.81)
RDW: 16.6 % — ABNORMAL HIGH (ref 11.5–15.5)
RDW: 16.5 % — ABNORMAL HIGH (ref 11.5–15.5)
RDW: 16.8 % — ABNORMAL HIGH (ref 11.5–15.5)
WBC: 15.2 10*3/uL — ABNORMAL HIGH (ref 4.0–10.5)
WBC: 12.2 10*3/uL — ABNORMAL HIGH (ref 4.0–10.5)
WBC: 14.1 10*3/uL — ABNORMAL HIGH (ref 4.0–10.5)
nRBC: 0.3 % — ABNORMAL HIGH (ref 0.0–0.2)
nRBC: 0.2 % (ref 0.0–0.2)
nRBC: 0 % (ref 0.0–0.2)

## 2021-03-06 LAB — HEPATIC FUNCTION PANEL
ALT: 17 U/L (ref 0–44)
AST: 12 U/L — ABNORMAL LOW (ref 15–41)
Albumin: 3.2 g/dL — ABNORMAL LOW (ref 3.5–5.0)
Alkaline Phosphatase: 62 U/L (ref 38–126)
Bilirubin, Direct: <0.1 mg/dL (ref 0.0–0.2)
Total Bilirubin: 1 mg/dL (ref 0.3–1.2)
Total Protein: 6.2 g/dL — ABNORMAL LOW (ref 6.5–8.1)

## 2021-03-06 LAB — BASIC METABOLIC PANEL
Anion gap: 28 — ABNORMAL HIGH (ref 5–15)
BUN: 149 mg/dL — ABNORMAL HIGH (ref 6–20)
BUN: 136 mg/dL — ABNORMAL HIGH (ref 6–20)
CO2: <7 mmol/L — ABNORMAL LOW (ref 22–32)
CO2: 8 mmol/L — ABNORMAL LOW (ref 22–32)
Calcium: 8.2 mg/dL — ABNORMAL LOW (ref 8.9–10.3)
Calcium: 8.1 mg/dL — ABNORMAL LOW (ref 8.9–10.3)
Chloride: 97 mmol/L — ABNORMAL LOW (ref 98–111)
Chloride: 104 mmol/L (ref 98–111)
Creatinine, Ser: 34.05 mg/dL — ABNORMAL HIGH (ref 0.61–1.24)
Creatinine, Ser: 27.25 mg/dL — ABNORMAL HIGH (ref 0.61–1.24)
GFR, Estimated: 1 mL/min — ABNORMAL LOW (ref >60)
GFR, Estimated: 2 mL/min — ABNORMAL LOW (ref >60)
Glucose, Bld: 104 mg/dL — ABNORMAL HIGH (ref 70–99)
Glucose, Bld: 156 mg/dL — ABNORMAL HIGH (ref 70–99)
Potassium: 7.3 mmol/L (ref 3.5–5.1)
Potassium: 5.5 mmol/L — ABNORMAL HIGH (ref 3.5–5.1)
Sodium: 136 mmol/L (ref 135–145)
Sodium: 140 mmol/L (ref 135–145)

## 2021-03-06 LAB — RESP PANEL BY RT-PCR (FLU A&B, COVID) ARPGX2
Influenza A by PCR: NEGATIVE
Influenza B by PCR: NEGATIVE
SARS Coronavirus 2 by RT PCR: NEGATIVE

## 2021-03-06 LAB — LIPASE, BLOOD: Lipase: 112 U/L — ABNORMAL HIGH (ref 11–51)

## 2021-03-06 LAB — GLUCOSE, CAPILLARY: Glucose-Capillary: 116 mg/dL — ABNORMAL HIGH (ref 70–99)

## 2021-03-06 LAB — POTASSIUM: Potassium: 6.1 mmol/L — ABNORMAL HIGH (ref 3.5–5.1)

## 2021-03-06 LAB — PROTIME-INR
INR: 1.3 — ABNORMAL HIGH (ref 0.8–1.2)
Prothrombin Time: 16.4 seconds — ABNORMAL HIGH (ref 11.4–15.2)

## 2021-03-06 LAB — TYPE AND SCREEN
ABO/RH(D): A POS
Antibody Screen: NEGATIVE

## 2021-03-06 LAB — PROCALCITONIN: Procalcitonin: 1.04 ng/mL

## 2021-03-06 LAB — LACTIC ACID, PLASMA: Lactic Acid, Venous: 0.9 mmol/L (ref 0.5–1.9)

## 2021-03-06 LAB — APTT: aPTT: 35 seconds (ref 24–36)

## 2021-03-06 MED ORDER — SODIUM ZIRCONIUM CYCLOSILICATE 5 G PO PACK
5.0000 g | PACK | Freq: Once | ORAL | Status: AC
  Administered 2021-03-06: 5 g via ORAL
  Filled 2021-03-06: qty 1

## 2021-03-06 MED ORDER — ACETAMINOPHEN 325 MG PO TABS
650.0000 mg | ORAL_TABLET | Freq: Four times a day (QID) | ORAL | Status: DC | PRN
  Administered 2021-03-08: 650 mg via ORAL
  Filled 2021-03-06: qty 2

## 2021-03-06 MED ORDER — HYDRALAZINE HCL 20 MG/ML IJ SOLN
5.0000 mg | INTRAMUSCULAR | Status: DC | PRN
  Administered 2021-03-07 – 2021-03-08 (×2): 5 mg via INTRAVENOUS
  Filled 2021-03-06 (×2): qty 1

## 2021-03-06 MED ORDER — SODIUM CHLORIDE 0.9 % IV SOLN
1.0000 g | INTRAVENOUS | Status: DC
  Administered 2021-03-06: 1 g via INTRAVENOUS
  Filled 2021-03-06 (×2): qty 1

## 2021-03-06 MED ORDER — PANTOPRAZOLE SODIUM 40 MG IV SOLR
40.0000 mg | Freq: Two times a day (BID) | INTRAVENOUS | Status: DC
  Administered 2021-03-07 – 2021-03-08 (×4): 40 mg via INTRAVENOUS
  Filled 2021-03-06 (×4): qty 40

## 2021-03-06 MED ORDER — VANCOMYCIN HCL 1500 MG/300ML IV SOLN
1500.0000 mg | Freq: Once | INTRAVENOUS | Status: AC
  Administered 2021-03-06: 1500 mg via INTRAVENOUS
  Filled 2021-03-06: qty 300

## 2021-03-06 MED ORDER — SODIUM CHLORIDE 0.9 % IV BOLUS
1000.0000 mL | Freq: Once | INTRAVENOUS | Status: AC
  Administered 2021-03-06: 1000 mL via INTRAVENOUS

## 2021-03-06 MED ORDER — PANTOPRAZOLE SODIUM 40 MG IV SOLR
40.0000 mg | Freq: Once | INTRAVENOUS | Status: AC
  Administered 2021-03-06: 40 mg via INTRAVENOUS
  Filled 2021-03-06: qty 40

## 2021-03-06 MED ORDER — CALCIUM ACETATE (PHOS BINDER) 667 MG PO CAPS
1334.0000 mg | ORAL_CAPSULE | Freq: Three times a day (TID) | ORAL | Status: DC
  Administered 2021-03-08 – 2021-03-12 (×8): 1334 mg via ORAL
  Filled 2021-03-06 (×19): qty 2

## 2021-03-06 MED ORDER — ONDANSETRON HCL 4 MG/2ML IJ SOLN
4.0000 mg | Freq: Three times a day (TID) | INTRAMUSCULAR | Status: DC | PRN
  Administered 2021-03-06 – 2021-03-12 (×12): 4 mg via INTRAVENOUS
  Filled 2021-03-06 (×11): qty 2

## 2021-03-06 MED ORDER — PATIROMER SORBITEX CALCIUM 8.4 G PO PACK
25.2000 g | PACK | ORAL | Status: AC
  Administered 2021-03-06: 25.2 g via ORAL
  Filled 2021-03-06 (×2): qty 3

## 2021-03-06 MED ORDER — DM-GUAIFENESIN ER 30-600 MG PO TB12: 1.0000 | ORAL_TABLET | Freq: Two times a day (BID) | ORAL | Status: DC | PRN

## 2021-03-06 MED ORDER — METOPROLOL SUCCINATE ER 100 MG PO TB24
100.0000 mg | ORAL_TABLET | Freq: Every day | ORAL | Status: DC
  Administered 2021-03-08 – 2021-03-12 (×5): 100 mg via ORAL
  Filled 2021-03-06 (×6): qty 1

## 2021-03-06 MED ORDER — NICOTINE 14 MG/24HR TD PT24
14.0000 mg | MEDICATED_PATCH | Freq: Every day | TRANSDERMAL | Status: DC
  Administered 2021-03-06 – 2021-03-12 (×7): 14 mg via TRANSDERMAL
  Filled 2021-03-06 (×7): qty 1

## 2021-03-06 MED ORDER — SODIUM BICARBONATE 8.4 % IV SOLN
50.0000 meq | Freq: Once | INTRAVENOUS | Status: AC
  Administered 2021-03-06: 50 meq via INTRAVENOUS
  Filled 2021-03-06: qty 50

## 2021-03-06 MED ORDER — SODIUM BICARBONATE 650 MG PO TABS
1300.0000 mg | ORAL_TABLET | Freq: Two times a day (BID) | ORAL | Status: DC
  Administered 2021-03-07 – 2021-03-09 (×4): 1300 mg via ORAL
  Filled 2021-03-06 (×5): qty 2

## 2021-03-06 MED ORDER — DELFLEX-LC/2.5% DEXTROSE 394 MOSM/L IP SOLN
INTRAPERITONEAL | Status: DC
  Filled 2021-03-06 (×6): qty 3000

## 2021-03-06 MED ORDER — SODIUM CHLORIDE 0.9 % IV SOLN
2.0000 g | Freq: Once | INTRAVENOUS | Status: AC
  Administered 2021-03-06: 2 g via INTRAVENOUS
  Filled 2021-03-06: qty 20

## 2021-03-06 MED ORDER — AMLODIPINE BESYLATE 5 MG PO TABS
5.0000 mg | ORAL_TABLET | Freq: Every day | ORAL | Status: DC
  Administered 2021-03-08 – 2021-03-11 (×4): 5 mg via ORAL
  Filled 2021-03-06 (×5): qty 1

## 2021-03-06 MED ORDER — INSULIN ASPART 100 UNIT/ML IV SOLN
10.0000 [IU] | Freq: Once | INTRAVENOUS | Status: AC
  Administered 2021-03-06: 10 [IU] via INTRAVENOUS
  Filled 2021-03-06: qty 0.1

## 2021-03-06 MED ORDER — SERTRALINE HCL 50 MG PO TABS
25.0000 mg | ORAL_TABLET | Freq: Every day | ORAL | Status: DC
  Administered 2021-03-08 – 2021-03-12 (×5): 25 mg via ORAL
  Filled 2021-03-06 (×6): qty 1

## 2021-03-06 MED ORDER — SODIUM CHLORIDE 0.9 % IV SOLN
INTRAVENOUS | Status: DC | PRN
  Administered 2021-03-06: 250 mL via INTRAVENOUS

## 2021-03-06 MED ORDER — ALBUTEROL SULFATE (2.5 MG/3ML) 0.083% IN NEBU: 2.5000 mg | INHALATION_SOLUTION | RESPIRATORY_TRACT | Status: DC | PRN

## 2021-03-06 MED ORDER — DEXTROSE 50 % IV SOLN
2.0000 | Freq: Once | INTRAVENOUS | Status: AC
  Administered 2021-03-06: 100 mL via INTRAVENOUS
  Filled 2021-03-06: qty 100

## 2021-03-06 MED ORDER — CALCIUM GLUCONATE 10 % IV SOLN
1.0000 g | Freq: Once | INTRAVENOUS | Status: AC
  Administered 2021-03-06: 1 g via INTRAVENOUS
  Filled 2021-03-06: qty 10

## 2021-03-06 MED ORDER — GENTAMICIN SULFATE 0.1 % EX CREA
1.0000 "application " | TOPICAL_CREAM | Freq: Every day | CUTANEOUS | Status: DC
  Administered 2021-03-08 – 2021-03-12 (×4): 1 via TOPICAL
  Filled 2021-03-06: qty 15

## 2021-03-06 MED ORDER — SODIUM CHLORIDE 0.9 % IV SOLN
500.0000 mg | Freq: Once | INTRAVENOUS | Status: AC
  Administered 2021-03-06: 500 mg via INTRAVENOUS
  Filled 2021-03-06: qty 500

## 2021-03-06 NOTE — ED Notes (Signed)
Sent msg to pharmacy re: missing [place].

## 2021-03-06 NOTE — H&P (Addendum)
History and Physical    Larry [name] [id number] DOB: April 15, 1966 DOA: 03/06/2021  Referring MD/NP/PA:   PCP: Margo Common, PA-C   Patient coming from:  The patient is coming from home.  At baseline, pt is independent for most of ADL.        Chief Complaint: Productive cough, coffee-ground emesis, weakness  HPI: Larry [name] is a 55 y.o. male with medical history significant of ESRD-peritoneal dialysis, nephrotic syndrome, HTN, GERD, depression, anemia, PAF not on anticoagulants, who presents with productive cough, coffee-ground emesis and weakness.  Patient states that he has been sick for several days. He has cough with thick yellow-colored sputum production.  Patient is taking Z-Pak for URI currently.  Denies shortness of breath or chest pain.  No subjective fever or chills.  Patient has generalized weakness, fatigue, malaise, body aches.  He has nausea and vomited coffee-ground material.  Patient had episode of loose stool bowel movement.  He had mild abdominal pain earlier, which has resolved.  Currently patient does not have abdominal pain or diarrhea.  No symptoms of UTI.  Patient states that he is doing nightly peritoneal dialysis at home.  ED Course: pt was found to have potassium 7.3, WBC 15.2, lactic acid of 0.9, negative COVID PCR, hemoglobin 8.5 (9.1 on 12/06/2020), bicarbonate<7.0, creatinine 34, BUN 149, blood pressure 153/70, heart rate 89, RR 22, temperature 97.9, oxygen saturation 96% on room air.  Patient is admitted to progressive bed as inpatient.  Dr. Holley Raring of renal and Dr. Alice Reichert of GI are consulted.  X-ray of chest/abdomen:  1. Cardiomegaly with vascular congestion and diffuse bilateral interstitial opacity probably due to pulmonary edema. Patchy ground-glass opacities in the left thorax may be due to slight asymmetric edema versus superimposed pneumonia 2. Nonobstructed gas pattern   Review of Systems:   General: no fevers, chills, no body weight  gain, has poor appetite, has fatigue HEENT: no blurry vision, hearing changes or sore throat Respiratory: no dyspnea, has coughing, no wheezing CV: no chest pain, no palpitations GI: has nausea, vomiting, coffee-ground emesis, abdominal pain, no diarrhea, constipation GU: no dysuria, burning on urination, increased urinary frequency, hematuria  Ext: no leg edema Neuro: no unilateral weakness, numbness, or tingling, no vision change or hearing loss Skin: no rash, no skin tear. MSK: No muscle spasm, no deformity, no limitation of range of movement in spin Heme: No easy bruising.  Travel history: No recent long distant travel.  Allergy:  Allergies  Allergen Reactions   Mushroom Extract Complex     Headache, migraines   Amoxicillin Nausea And Vomiting    Weakness  Has patient had a PCN reaction causing immediate rash, facial/tongue/throat swelling, SOB or lightheadedness with hypotension: No Has patient had a PCN reaction causing severe rash involving mucus membranes or skin necrosis: No Has patient had a PCN reaction that required hospitalization: No Has patient had a PCN reaction occurring within the last 10 years: No If all of the above answers are "NO", then may proceed with Cephalosporin use.     Past Medical History:  Diagnosis Date   Anemia    ESRD on peritoneal dialysis ([place])    Hypertension    Nephrotic syndrome     Past Surgical History:  Procedure Laterality Date   CAPD INSERTION N/A 01/06/2018   Procedure: LAPAROSCOPIC INSERTION CONTINUOUS AMBULATORY PERITONEAL DIALYSIS  (CAPD) CATHETER;  Surgeon: Katha Cabal, MD;  Location: [facility];  Service: Vascular;  Laterality: N/A;   CHOLECYSTECTOMY  CYSTOSCOPY W/ URETERAL STENT REMOVAL Right 10/23/2017   Procedure: CYSTOSCOPY WITH STENT REPLACEMENT;  Surgeon: [name], MD;  Location: [facility];  Service: Urology;  Laterality: Right;   CYSTOSCOPY WITH STENT PLACEMENT Right 09/30/2017   Procedure: CYSTOSCOPY WITH  STENT PLACEMENT;  Surgeon: [name], MD;  Location: [facility];  Service: Urology;  Laterality: Right;   CYSTOSCOPY/RETROGRADE/URETEROSCOPY Right 10/23/2017   Procedure: CYSTOSCOPY/RETROGRADE/URETEROSCOPY;  Surgeon: [name], MD;  Location: [facility];  Service: Urology;  Laterality: Right;   RENAL BIOPSY     TEMPORARY DIALYSIS CATHETER N/A 12/07/2020   Procedure: TEMPORARY DIALYSIS CATHETER;  Surgeon: Katha Cabal, MD;  Location: [facility];  Service: Cardiovascular;  Laterality: N/A;    Social History:  reports that he has been smoking cigarettes. He has a 20.00 pack-year smoking history. He has never used smokeless tobacco. He reports current alcohol use. He reports that he does not use drugs.  Family History:  Family History  Problem Relation Age of Onset   Diabetes Mother    Kidney disease Mother        mother on dialysis   CAD Mother    Diabetes Sister    CAD Sister      Prior to Admission medications   Medication Sig Start Date End Date Taking? Authorizing Provider  acetaminophen (TYLENOL) 500 MG tablet Take by mouth.    [provider]  amLODipine (NORVASC) 10 MG tablet Take 1 tablet (10 mg total) by mouth daily. 01/16/21   Minna Merritts, MD  calcium acetate (PHOSLO) 667 MG capsule Take 1,334 mg by mouth 3 (three) times daily. 12/03/20   [provider]  famotidine (PEPCID) 20 MG tablet Take 20 mg by mouth 2 (two) times daily as needed for heartburn or indigestion.    [provider]  hydrALAZINE (APRESOLINE) 50 MG tablet Take 50 mg by mouth daily as needed (If diastolic bp is 90 or above).     [provider]  metoprolol succinate (TOPROL-XL) 100 MG 24 hr tablet Take 1 tablet (100 mg total) by mouth daily. 01/16/21   Minna Merritts, MD  nicotine (NICODERM CQ - DOSED IN MG/24 HOURS) 14 mg/24hr patch Place 14 mg onto the skin daily.    [provider]  ondansetron (ZOFRAN-ODT) 4 MG disintegrating tablet  Take 4 mg by mouth every 8 (eight) hours as needed. 12/05/20   [provider]  oxybutynin (DITROPAN) 5 MG tablet Take 5 mg by mouth 2 (two) times daily. Patient not taking: Reported on 01/16/2021 01/18/18   [provider]  sertraline (ZOLOFT) 25 MG tablet Take 25 mg by mouth daily.  09/25/17   [provider]  sodium bicarbonate 650 MG tablet Take 1,300 mg by mouth 2 (two) times daily.     [provider]    Physical Exam: Vitals:   03/06/21 1700 03/06/21 1719 03/06/21 1720 03/06/21 1730  BP: 140/72   135/69  Pulse: 99   95  Resp: 16   16  Temp:  97.9 F (36.6 C)    TempSrc:  Oral    SpO2: 99%   98%  Weight:   68 kg   Height:   '5\' 4"'$  (1.626 m)    General: Not in acute distress HEENT:       Eyes: PERRL, EOMI, no scleral icterus.       ENT: No discharge from the ears and nose, no pharynx injection, no tonsillar enlargement.  Neck: No JVD, no bruit, no mass felt. Heme: No neck lymph node enlargement. Cardiac: S1/S2, RRR, No murmurs, No gallops or rubs. Respiratory: Has coarse breathing sound bilaterally GI: Soft, nondistended, nontender, no rebound pain, no organomegaly, BS present.  S/p of peritoneal dialysis catheter placement in left lower quadrant with clean surroundings. GU: No hematuria Ext: No pitting leg edema bilaterally. 1+DP/PT pulse bilaterally. Musculoskeletal: No joint deformities, No joint redness or warmth, no limitation of ROM in spin. Skin: No rashes.  Neuro: Alert, oriented X3, cranial nerves II-XII grossly intact, moves all extremities normally. Psych: Patient is not psychotic, no suicidal or hemocidal ideation.  Labs on Admission: I have personally reviewed following labs and imaging studies  CBC: Recent Labs  Lab 03/06/21 1329  WBC 15.2*  HGB 8.5*  HCT 25.8*  MCV 95.9  PLT [id number]   Basic Metabolic Panel: Recent Labs  Lab 03/06/21 1329  NA 136  K 7.3*  CL 97*  CO2 <7*  GLUCOSE 104*  BUN 149*  CREATININE  34.05*  CALCIUM 8.2*   GFR: Estimated Creatinine Clearance: 2.1 mL/min (A) (by C-G formula based on SCr of 34.05 mg/dL (H)). Liver Function Tests: No results for input(s): AST, ALT, ALKPHOS, BILITOT, PROT, ALBUMIN in the last 168 hours. No results for input(s): LIPASE, AMYLASE in the last 168 hours. No results for input(s): AMMONIA in the last 168 hours. Coagulation Profile: No results for input(s): INR, PROTIME in the last 168 hours. Cardiac Enzymes: No results for input(s): CKTOTAL, CKMB, CKMBINDEX, TROPONINI in the last 168 hours. BNP (last 3 results) No results for input(s): PROBNP in the last 8760 hours. HbA1C: No results for input(s): HGBA1C in the last 72 hours. CBG: No results for input(s): GLUCAP in the last 168 hours. Lipid Profile: No results for input(s): CHOL, HDL, LDLCALC, TRIG, CHOLHDL, LDLDIRECT in the last 72 hours. Thyroid Function Tests: No results for input(s): TSH, T4TOTAL, FREET4, T3FREE, THYROIDAB in the last 72 hours. Anemia Panel: No results for input(s): VITAMINB12, FOLATE, FERRITIN, TIBC, IRON, RETICCTPCT in the last 72 hours. Urine analysis:    Component Value Date/Time   COLORURINE YELLOW (A) 12/14/2018 1613   APPEARANCEUR CLEAR (A) 12/14/2018 1613   APPEARANCEUR Clear 10/15/2017 1512   LABSPEC 1.017 12/14/2018 1613   LABSPEC 1.011 01/18/2015 1423   PHURINE 7.0 12/14/2018 1613   GLUCOSEU 150 (A) 12/14/2018 1613   GLUCOSEU >=500 01/18/2015 1423   HGBUR SMALL (A) 12/14/2018 1613   BILIRUBINUR NEGATIVE 12/14/2018 1613   BILIRUBINUR Negative 10/15/2017 1512   BILIRUBINUR Negative 01/18/2015 1423   KETONESUR 5 (A) 12/14/2018 1613   PROTEINUR >=300 (A) 12/14/2018 1613   NITRITE NEGATIVE 12/14/2018 1613   LEUKOCYTESUR NEGATIVE 12/14/2018 1613   LEUKOCYTESUR Negative 01/18/2015 1423   Sepsis Labs: '@LABRCNTIP'$ (procalcitonin:4,lacticidven:4) ) Recent Results (from the past 240 hour(s))  Resp Panel by RT-PCR (Flu A&B, Covid) Nasopharyngeal Swab      Status: None   Collection Time: 03/06/21  2:01 PM   Specimen: Nasopharyngeal Swab; Nasopharyngeal(NP) swabs in vial transport medium  Result Value Ref Range Status   SARS Coronavirus 2 by RT PCR NEGATIVE NEGATIVE Final    Comment: (NOTE) SARS-CoV-2 target nucleic acids are NOT DETECTED.  The SARS-CoV-2 RNA is generally detectable in upper respiratory specimens during the acute phase of infection. The lowest concentration of SARS-CoV-2 viral copies this assay can detect is 138 copies/mL. A negative result does not preclude SARS-Cov-2 infection and should not be used as the sole basis for treatment or other patient  management decisions. A negative result may occur with  improper specimen collection/handling, submission of specimen other than nasopharyngeal swab, presence of viral mutation(s) within the areas targeted by this assay, and inadequate number of viral copies(<138 copies/mL). A negative result must be combined with clinical observations, patient history, and epidemiological information. The expected result is Negative.  Fact Sheet for Patients:  EntrepreneurPulse.com.au  Fact Sheet for Healthcare Providers:  IncredibleEmployment.be  This test is no t yet approved or cleared by the [place] FDA and  has been authorized for detection and/or diagnosis of SARS-CoV-2 by FDA under an Emergency Use Authorization (EUA). This EUA will remain  in effect (meaning this test can be used) for the duration of the COVID-19 declaration under Section 564(b)(1) of the Act, 21 U.S.C.section 360bbb-3(b)(1), unless the authorization is terminated  or revoked sooner.       Influenza A by PCR NEGATIVE NEGATIVE Final   Influenza B by PCR NEGATIVE NEGATIVE Final    Comment: (NOTE) The Xpert Xpress SARS-CoV-2/FLU/RSV plus assay is intended as an aid in the diagnosis of influenza from Nasopharyngeal swab specimens and should not be used as a sole basis  for treatment. Nasal washings and aspirates are unacceptable for Xpert Xpress SARS-CoV-2/FLU/RSV testing.  Fact Sheet for Patients: EntrepreneurPulse.com.au  Fact Sheet for Healthcare Providers: IncredibleEmployment.be  This test is not yet approved or cleared by the [place] FDA and has been authorized for detection and/or diagnosis of SARS-CoV-2 by FDA under an Emergency Use Authorization (EUA). This EUA will remain in effect (meaning this test can be used) for the duration of the COVID-19 declaration under Section 564(b)(1) of the Act, 21 U.S.C. section 360bbb-3(b)(1), unless the authorization is terminated or revoked.  Performed at [facility], [address] [place], [place] [address]      Radiological Exams on Admission: DG Abdomen Acute W/Chest  Result Date: 03/06/2021 CLINICAL DATA:  Weakness EXAM: DG ABDOMEN ACUTE WITH 1 VIEW CHEST COMPARISON:  Chest x-ray 12/06/2020, abdominal radiograph 12/07/2020 FINDINGS: Single-view chest demonstrates cardiomegaly with vascular congestion. Diffuse bilateral interstitial opacity with patchy ground-glass opacity in the left thorax. No pneumothorax. Supine and upright views of the abdomen demonstrate no free air beneath the diaphragm. Surgical clips in the right upper quadrant. Nonobstructed gas pattern. Peritoneal dialysis catheter coiled over the left mid abdomen. No radiopaque calculi. IMPRESSION: 1. Cardiomegaly with vascular congestion and diffuse bilateral interstitial opacity probably due to pulmonary edema. Patchy ground-glass opacities in the left thorax may be due to slight asymmetric edema versus superimposed pneumonia 2. Nonobstructed gas pattern Electronically Signed   By: Donavan Foil M.D.   On: 03/06/2021 15:43     EKG: I have personally reviewed.  Sinus rhythm, QTC 457, LAE, LAD, T wave peaking, T wave inversion only in lead III.  Assessment/Plan Principal Problem:    Uremia Active Problems:   ESRD on peritoneal dialysis ([facility])   HTN (hypertension)   Nicotine dependence   Paroxysmal atrial fibrillation ([facility])   Hyperkalemia   Coffee ground emesis   Sepsis ([place])   HCAP (healthcare-associated pneumonia)   Anemia in ESRD (end-stage renal disease) (HCC)   GERD (gastroesophageal reflux disease)   Uremia and hyperkalemia: K 7.3. Has T-wave peaking on EKG. Consulted Dr. Holley Raring of renal.   -admit to progressive bed as inpatient -Patient was treated for hyperkalemia with D50, NovoLog 10 unit, 50 mEq of sodium bicarbonate, patiromer 25.2 g -pt was given 1L of NS in ED -will recheck K at 8:00 PM  ESRD on peritoneal dialysis [facility]) -renal is consulted for dialysis  HTN (hypertension) -IV hydralazine as needed -Amlodipine, metoprolol  Nicotine dependence -Nicotine patch  Paroxysmal atrial fibrillation ([place]): Patient's not taking anticoagulants. -Continue home metoprolol  Coffee ground emesis and anemia in ESRD: Hemoglobin 9.1 on 12/06/2020 --> 8.5.  Patient is taking high-dose ibuprofen 800 mg prn tid. Dr. Alice Reichert of GI is consulted. They will see pt in AM  - IVF: 1L NS bolus in ED - Start IV pantoprazole 40 mg bid - Zofran IV for nausea - Avoid NSAIDs and SQ heparin -hold ibuprofen - Maintain IV access (2 large bore IVs if possible). - Monitor closely and follow q6h cbc, transfuse as necessary, if Hgb<7.0 - LaB: INR, PTT and type screen  Sepsis due to HCAP (healthcare-associated pneumonia): Patient meets criteria for sepsis with leukocytosis with WBC 15.2, tachycardia with heart rate of 99, and tachypnea with RR 22.  Patient has productive cough.  X-ray showed possible infiltration, indicating possible pneumonia/HCAP.  Lactic acid normal 0.9.  Procalcitonin is elevated 1.04. - IV Vancomycin and cefepime (patient received 1 dose of Rocephin and azithromycin in ED) - Mucinex for cough  - Bronchodilators - Urine legionella and S. pneumococcal  antigen - Follow up blood culture x2, sputum culture  - IVF: 1L of NS bolus in ED  GERD (gastroesophageal reflux disease): -on protonix IV as above     DVT ppx: SCD Code Status: Full code Family Communication:    Yes, patient's wife   at bed side Disposition Plan:  Anticipate discharge back to previous environment Consults called:  Dr. Holley Raring of renal and Dr. Alice Reichert of GI are consulted. Admission status and Level of care: Progressive Cardiac:    as inpt     Status is: Inpatient  Remains inpatient appropriate because:Inpatient level of care appropriate due to severity of illness  Dispo: The patient is from: Home              Anticipated d/c is to: Home              Patient currently is not medically stable to d/c.   Difficult to place patient No          Date of Service 03/06/2021    [facility]   If 7PM-7AM, please contact night-coverage [email or web address] 03/06/2021, 5:41 PM

## 2021-03-06 NOTE — ED Notes (Signed)
Pt to ED for weakness, "pain all over", coughing up "black stuff". Denies abdominal pain, states has had 2 diarrheal episodes today. Pt has peritoneal dialysis catheter in LLQ abdomen.

## 2021-03-06 NOTE — Consult Note (Addendum)
GI Inpatient Consult Note  Reason for Consult: Coffee-ground emesis    Attending Requesting Consult: Dr. Ivor Costa  History of Present Illness: Larry [name] is a 55 y.o. male seen for evaluation of coffee-ground emesis at the request of admitting hospitalist - Dr. Blaine Hamper Pt has a PMH of HTN, paroxsymal atrial fibrillation not on chronic anticoagulation, anemia of chronic disease, and ESRD on peritoneal dialysis. Pt presented to the [facility] ED via EMS from home this afternoon for chief complaint of generalized weakness and nausea/vomiting for the past four days. He reports for the past 4 days he has been having multiple episodes of coffee ground emesis and progressive weakness. Last episode of emesis was just before EMS was called and one episode in the ED. Upon presentation to the ED, patient was found to have K 7.3, WBC 15.2, lactic acid 0.9, hemoglobin 8.5 (9.1 on 12/06/2020), creatinine 34, BUN 149. COVID-19 and flu negative. Chest X-ray showed hazy infiltrates in left lung suspicious for edema versus multifocal pneumonia. He was placed on Rocephin and his nephrologist (Dr. Holley Raring) was called and advised peritoneal dialysis once admitted. Hyperkalemia was treated with D50, insulin, 50 mEq of sodium bicarbonate, and Patiromer. Patient seen and examined this afternoon resting comfortably in hospital bed. He reports that he has been dealing with productive cough with thick yellow-colored sputum over the past four days. He denies any recent sick contacts. He denies any fevers, chills, chest pain, or shortness of breath. He feels very weak and attributes this to not being able to keep anything down and having the vomiting. He reports he has been having coffee-ground emesis over the past four days, several episodes daily. Repeat hemoglobin this afternoon 7.9. He denies any associated heartburn, reflux, indigestion, dysphagia, odynophagia, hoarseness, or abdominal pain. He did have one loose BM today, but doesn't  remember what color it was. He denies any overt bloody or tarry stools. He denies any previous EGD. He does have hx of colonoscopy 12/2017 performed at [facility] removing 14 subcentimeter polyps with path showing adenomatous and hyperplastic polyps without high-grade dysplasia. He denies any frequent NSAID use. He took 800 mg ibuprofen a couple days ago for general aches and pains. He reports he has not missed any dialysis sessions.      Last Colonoscopy: 12/2017 (Dr. Carlos Levering at [facility]) Impression:         - Four 2 to 3 mm polyps in the rectum, in the sigmoid colon, in the descending colon and in the ascending colon, removed with a jumbo cold forceps. Resected and retrieved.   - Ten 5 to 7 mm polyps in the sigmoid colon, in the descending colon, in the transverse colon and in the ascending colon, removed with a cold snare. Resected and retrieved.  Path: multiple fragments of adenomatous polyps w/o HGD, one fragment of hyperplastic polyp  Last Endoscopy: N/A   Past Medical History:  Past Medical History:  Diagnosis Date   Anemia    ESRD on peritoneal dialysis ([place])    Hypertension    Nephrotic syndrome     Problem List: Patient Active Problem List   Diagnosis Date Noted   Uremia 03/06/2021   Hyperkalemia 03/06/2021   Coffee ground emesis 03/06/2021   Sepsis ([place]) 03/06/2021   HCAP (healthcare-associated pneumonia) 03/06/2021   Anemia in ESRD (end-stage renal disease) ([place]) 03/06/2021   GERD (gastroesophageal reflux disease) 03/06/2021   Pericarditis 12/09/2020   Uremia syndrome 12/09/2020   Paroxysmal atrial fibrillation (HCC)    HTN (hypertension)  12/06/2020   Nicotine dependence 12/06/2020   Intractable vomiting with nausea 12/06/2020   ESRD on peritoneal dialysis ([place]) 10/29/2020   Renal dialysis device, implant, or graft complication [id number]   Open displaced fracture of proximal phalanx of left thumb 02/29/2020   Anemia of chronic kidney failure, stage 5 ([place]) 12/28/2017    Hydronephrosis 10/15/2017   AKI (acute kidney injury) ([place]) 09/30/2017    Past Surgical History: Past Surgical History:  Procedure Laterality Date   CAPD INSERTION N/A 01/06/2018   Procedure: LAPAROSCOPIC INSERTION CONTINUOUS AMBULATORY PERITONEAL DIALYSIS  (CAPD) CATHETER;  Surgeon: Katha Cabal, MD;  Location: [facility] ORS;  Service: Vascular;  Laterality: N/A;   CHOLECYSTECTOMY     CYSTOSCOPY W/ URETERAL STENT REMOVAL Right 10/23/2017   Procedure: CYSTOSCOPY WITH STENT REPLACEMENT;  Surgeon: [name], MD;  Location: [facility] ORS;  Service: Urology;  Laterality: Right;   CYSTOSCOPY WITH STENT PLACEMENT Right 09/30/2017   Procedure: CYSTOSCOPY WITH STENT PLACEMENT;  Surgeon: [name], MD;  Location: [facility] ORS;  Service: Urology;  Laterality: Right;   CYSTOSCOPY/RETROGRADE/URETEROSCOPY Right 10/23/2017   Procedure: CYSTOSCOPY/RETROGRADE/URETEROSCOPY;  Surgeon: [name], MD;  Location: [facility] ORS;  Service: Urology;  Laterality: Right;   RENAL BIOPSY     TEMPORARY DIALYSIS CATHETER N/A 12/07/2020   Procedure: TEMPORARY DIALYSIS CATHETER;  Surgeon: Katha Cabal, MD;  Location: [place] [facility];  Service: Cardiovascular;  Laterality: N/A;    Allergies: Allergies  Allergen Reactions   Mushroom Extract Complex     Headache, migraines   Amoxicillin Nausea And Vomiting    Weakness  Has patient had a PCN reaction causing immediate rash, facial/tongue/throat swelling, SOB or lightheadedness with hypotension: No Has patient had a PCN reaction causing severe rash involving mucus membranes or skin necrosis: No Has patient had a PCN reaction that required hospitalization: No Has patient had a PCN reaction occurring within the last 10 years: No If all of the above answers are "NO", then may proceed with Cephalosporin use.     Home Medications: Medications Prior to Admission  Medication Sig Dispense Refill Last Dose   acetaminophen (TYLENOL) 500 MG tablet Take 500-1,000 mg  by mouth every 6 (six) hours as needed for mild pain or moderate pain.   Unknown at PRN   amLODipine (NORVASC) 10 MG tablet Take 1 tablet (10 mg total) by mouth daily. (Patient taking differently: Take 5 mg by mouth daily.) 90 tablet 3 Past Week at Unknown   calcium acetate (PHOSLO) 667 MG capsule Take 1,334 mg by mouth 3 (three) times daily.   Past Week at Unknown   famotidine (PEPCID) 20 MG tablet Take 20 mg by mouth 2 (two) times daily as needed for heartburn or indigestion.   Unknown at PRN   hydrALAZINE (APRESOLINE) 50 MG tablet Take 50 mg by mouth daily as needed (If diastolic bp is 90 or above).    Unknown at PRN   ibuprofen (ADVIL) 800 MG tablet Take 800 mg by mouth 3 (three) times daily as needed for pain.      nicotine (NICODERM CQ - DOSED IN MG/24 HOURS) 14 mg/24hr patch Place 14 mg onto the skin daily.      sertraline (ZOLOFT) 25 MG tablet Take 25 mg by mouth daily.   3 Past Week at Unknown   sodium bicarbonate 650 MG tablet Take 1,300 mg by mouth 2 (two) times daily.       metoprolol succinate (TOPROL-XL) 100 MG 24 hr tablet Take 1 tablet (  100 mg total) by mouth daily. 90 tablet 3    Home medication reconciliation was completed with the patient.   Scheduled Inpatient Medications:    amLODipine  5 mg Oral Daily   calcium acetate  1,334 mg Oral TID with meals   metoprolol succinate  100 mg Oral Daily   nicotine  14 mg Transdermal Daily   [START ON 03/07/2021] pantoprazole (PROTONIX) IV  40 mg Intravenous Q12H   sertraline  25 mg Oral Daily   sodium bicarbonate  1,300 mg Oral BID    Continuous Inpatient Infusions:    PRN Inpatient Medications:  acetaminophen, albuterol, dextromethorphan-guaiFENesin, hydrALAZINE, ondansetron (ZOFRAN) IV  Family History: family history includes CAD in his mother and sister; Diabetes in his mother and sister; Kidney disease in his mother.  The patient's family history is negative for inflammatory bowel disorders, GI malignancy, or solid organ  transplantation.  Social History:   reports that he has been smoking cigarettes. He has a 20.00 pack-year smoking history. He has never used smokeless tobacco. He reports current alcohol use. He reports that he does not use drugs. The patient denies ETOH, tobacco, or drug use.   Review of Systems: Constitutional: Weight is stable.  Eyes: No changes in vision. ENT: No oral lesions, sore throat.  GI: see HPI.  Heme/Lymph: No easy bruising.  CV: No chest pain.  GU: No hematuria.  Integumentary: No rashes.  Neuro: No headaches.  Psych: No depression/anxiety.  Endocrine: No heat/cold intolerance.  Allergic/Immunologic: No urticaria.  Resp: No cough, SOB.  Musculoskeletal: No joint swelling.    Physical Examination: BP 135/62   Pulse 95   Temp 97.9 F (36.6 C) (Oral)   Resp 17   Ht '5\' 4"'$  (1.626 m)   Wt 63.7 kg   SpO2 98%   BMI 24.11 kg/m  Gen: NAD, alert and oriented x 4 HEENT: PEERLA, EOMI, Neck: supple, no JVD or thyromegaly Chest: CTA bilaterally, no wheezes, crackles, or other adventitious sounds CV: RRR, no m/g/c/r Abd: soft, nondistended, +BS in all four quadrants; no HSM, guarding, ridigity, or rebound tenderness Ext: no edema, well perfused with 2+ pulses, Skin: no rash or lesions noted Lymph: no LAD  Data: Lab Results  Component Value Date   WBC 12.2 (H) 03/06/2021   HGB 7.9 (L) 03/06/2021   HCT 23.7 (L) 03/06/2021   MCV 92.9 03/06/2021   PLT 184 03/06/2021   Recent Labs  Lab 03/06/21 1329 03/06/21 1657  HGB 8.5* 7.9*   Lab Results  Component Value Date   NA 140 03/06/2021   K 5.5 (H) 03/06/2021   CL 104 03/06/2021   CO2 8 (L) 03/06/2021   BUN 136 (H) 03/06/2021   CREATININE 27.25 (H) 03/06/2021   Lab Results  Component Value Date   ALT 17 03/06/2021   AST 12 (L) 03/06/2021   ALKPHOS 62 03/06/2021   BILITOT 1.0 03/06/2021   Recent Labs  Lab 03/06/21 1657  APTT 35  INR 1.3*   Assessment/Plan:  55 y/o Caucasian male with a PMH of ESRD  on peritoneal dialysis, PAF, HTN presented to the [facility] ED from home this afternoon for chief complaint of generalized weakness and nausea and vomiting with coffee-ground emesis.    1. Coffee-ground emesis/Anemia c/wUGIB - DDx includes erosive esophagitis, peptic ulcer disease, gastritis +/- H pylori, duodenitis, AVMs, adenocarcinoma, varices, Dieulafoy's lesion, etc  2. Hyperkalemia - 7.3 on presentation. He has received tx with D50, insulin, sodium bicarbonate, and Patiromer. Repeat K 5.5.  3. ESRD on peritoneal dialysis - Nephrology consulted, plan for peritoneal dialysis session tonight.   4. Multifocal pneumonia - c/w clinical presentation of productive cough and CXR findings with hazy infiltrates in left lung. WBC 15.2 on admission. He is on empiric broad-spectrum antibiotics with Rocephin.   5. NSAID use - ibuprofen 800 mg as needed possibly contributory to his UGIB and worsening renal function. Counseled on importance of avoidance of NSAIDs.   COVID-19 and flu : NEGATIVE   Recommendations:  - H&H stable currently but downtrending c/w UGIB. Hgb 7.9 currently with no evidence of overt gastrointestinal bleeding.  - Continue to check serial H&H. Transfuse for Hgb <7.0.  - Agree with Protonix IV for gastric protection - Clear liquid diet okay for now. NPO after midnight.  - Advise EGD when clinically feasible, ideally with potassium 5.5 or lower and when respiratory status is optimized.  - Tentatively plan for EGD tomorrow if potassium is acceptable and respiratory status is optimized. Orders placed.  - If there is evidence of ongoing coffee-ground emesis with precipitous drop in H&H, please call Dr. Alice Reichert for emergent EGD.  - GI will continue to follow along with you.    I reviewed the risks (including bleeding, perforation, infection, anesthesia complications, cardiac/respiratory complications), benefits and alternatives of EGD. Patient consents to proceed.     Thank you for the  consult. Please call with questions or concerns.  Reeves Forth [facility] [phone] (Cell)

## 2021-03-06 NOTE — ED Notes (Signed)
V[place] not found in pyxis. Preparing for blood cultures and to administer IV abx. NP with nephrology at bedside.

## 2021-03-06 NOTE — H&P (View-Only) (Signed)
GI Inpatient Consult Note  Reason for Consult: Coffee-ground emesis    Attending Requesting Consult: Dr. Ivor Costa  History of Present Illness: Larry [name] is a 55 y.o. male seen for evaluation of coffee-ground emesis at the request of admitting hospitalist - Dr. Blaine Hamper Pt has a PMH of HTN, paroxsymal atrial fibrillation not on chronic anticoagulation, anemia of chronic disease, and ESRD on peritoneal dialysis. Pt presented to the [facility] ED via EMS from home this afternoon for chief complaint of generalized weakness and nausea/vomiting for the past four days. He reports for the past 4 days he has been having multiple episodes of coffee ground emesis and progressive weakness. Last episode of emesis was just before EMS was called and one episode in the ED. Upon presentation to the ED, patient was found to have K 7.3, WBC 15.2, lactic acid 0.9, hemoglobin 8.5 (9.1 on 12/06/2020), creatinine 34, BUN 149. COVID-19 and flu negative. Chest X-ray showed hazy infiltrates in left lung suspicious for edema versus multifocal pneumonia. He was placed on Rocephin and his nephrologist (Dr. Holley Raring) was called and advised peritoneal dialysis once admitted. Hyperkalemia was treated with D50, insulin, 50 mEq of sodium bicarbonate, and Patiromer. Patient seen and examined this afternoon resting comfortably in hospital bed. He reports that he has been dealing with productive cough with thick yellow-colored sputum over the past four days. He denies any recent sick contacts. He denies any fevers, chills, chest pain, or shortness of breath. He feels very weak and attributes this to not being able to keep anything down and having the vomiting. He reports he has been having coffee-ground emesis over the past four days, several episodes daily. Repeat hemoglobin this afternoon 7.9. He denies any associated heartburn, reflux, indigestion, dysphagia, odynophagia, hoarseness, or abdominal pain. He did have one loose BM today, but doesn't  remember what color it was. He denies any overt bloody or tarry stools. He denies any previous EGD. He does have hx of colonoscopy 12/2017 performed at [facility] removing 14 subcentimeter polyps with path showing adenomatous and hyperplastic polyps without high-grade dysplasia. He denies any frequent NSAID use. He took 800 mg ibuprofen a couple days ago for general aches and pains. He reports he has not missed any dialysis sessions.      Last Colonoscopy: 12/2017 (Dr. Carlos Levering at [facility]) Impression:         - Four 2 to 3 mm polyps in the rectum, in the sigmoid colon, in the descending colon and in the ascending colon, removed with a jumbo cold forceps. Resected and retrieved.   - Ten 5 to 7 mm polyps in the sigmoid colon, in the descending colon, in the transverse colon and in the ascending colon, removed with a cold snare. Resected and retrieved.  Path: multiple fragments of adenomatous polyps w/o HGD, one fragment of hyperplastic polyp  Last Endoscopy: N/A   Past Medical History:  Past Medical History:  Diagnosis Date   Anemia    ESRD on peritoneal dialysis ([place])    Hypertension    Nephrotic syndrome     Problem List: Patient Active Problem List   Diagnosis Date Noted   Uremia 03/06/2021   Hyperkalemia 03/06/2021   Coffee ground emesis 03/06/2021   Sepsis ([place]) 03/06/2021   HCAP (healthcare-associated pneumonia) 03/06/2021   Anemia in ESRD (end-stage renal disease) ([place]) 03/06/2021   GERD (gastroesophageal reflux disease) 03/06/2021   Pericarditis 12/09/2020   Uremia syndrome 12/09/2020   Paroxysmal atrial fibrillation (HCC)    HTN (hypertension)  12/06/2020   Nicotine dependence 12/06/2020   Intractable vomiting with nausea 12/06/2020   ESRD on peritoneal dialysis ([place]) 10/29/2020   Renal dialysis device, implant, or graft complication [id number]   Open displaced fracture of proximal phalanx of left thumb 02/29/2020   Anemia of chronic kidney failure, stage 5 ([place]) 12/28/2017    Hydronephrosis 10/15/2017   AKI (acute kidney injury) ([place]) 09/30/2017    Past Surgical History: Past Surgical History:  Procedure Laterality Date   CAPD INSERTION N/A 01/06/2018   Procedure: LAPAROSCOPIC INSERTION CONTINUOUS AMBULATORY PERITONEAL DIALYSIS  (CAPD) CATHETER;  Surgeon: Katha Cabal, MD;  Location: [facility] ORS;  Service: Vascular;  Laterality: N/A;   CHOLECYSTECTOMY     CYSTOSCOPY W/ URETERAL STENT REMOVAL Right 10/23/2017   Procedure: CYSTOSCOPY WITH STENT REPLACEMENT;  Surgeon: [name], MD;  Location: [facility] ORS;  Service: Urology;  Laterality: Right;   CYSTOSCOPY WITH STENT PLACEMENT Right 09/30/2017   Procedure: CYSTOSCOPY WITH STENT PLACEMENT;  Surgeon: [name], MD;  Location: [facility] ORS;  Service: Urology;  Laterality: Right;   CYSTOSCOPY/RETROGRADE/URETEROSCOPY Right 10/23/2017   Procedure: CYSTOSCOPY/RETROGRADE/URETEROSCOPY;  Surgeon: [name], MD;  Location: [facility] ORS;  Service: Urology;  Laterality: Right;   RENAL BIOPSY     TEMPORARY DIALYSIS CATHETER N/A 12/07/2020   Procedure: TEMPORARY DIALYSIS CATHETER;  Surgeon: Katha Cabal, MD;  Location: [place] [facility];  Service: Cardiovascular;  Laterality: N/A;    Allergies: Allergies  Allergen Reactions   Mushroom Extract Complex     Headache, migraines   Amoxicillin Nausea And Vomiting    Weakness  Has patient had a PCN reaction causing immediate rash, facial/tongue/throat swelling, SOB or lightheadedness with hypotension: No Has patient had a PCN reaction causing severe rash involving mucus membranes or skin necrosis: No Has patient had a PCN reaction that required hospitalization: No Has patient had a PCN reaction occurring within the last 10 years: No If all of the above answers are "NO", then may proceed with Cephalosporin use.     Home Medications: Medications Prior to Admission  Medication Sig Dispense Refill Last Dose   acetaminophen (TYLENOL) 500 MG tablet Take 500-1,000 mg  by mouth every 6 (six) hours as needed for mild pain or moderate pain.   Unknown at PRN   amLODipine (NORVASC) 10 MG tablet Take 1 tablet (10 mg total) by mouth daily. (Patient taking differently: Take 5 mg by mouth daily.) 90 tablet 3 Past Week at Unknown   calcium acetate (PHOSLO) 667 MG capsule Take 1,334 mg by mouth 3 (three) times daily.   Past Week at Unknown   famotidine (PEPCID) 20 MG tablet Take 20 mg by mouth 2 (two) times daily as needed for heartburn or indigestion.   Unknown at PRN   hydrALAZINE (APRESOLINE) 50 MG tablet Take 50 mg by mouth daily as needed (If diastolic bp is 90 or above).    Unknown at PRN   ibuprofen (ADVIL) 800 MG tablet Take 800 mg by mouth 3 (three) times daily as needed for pain.      nicotine (NICODERM CQ - DOSED IN MG/24 HOURS) 14 mg/24hr patch Place 14 mg onto the skin daily.      sertraline (ZOLOFT) 25 MG tablet Take 25 mg by mouth daily.   3 Past Week at Unknown   sodium bicarbonate 650 MG tablet Take 1,300 mg by mouth 2 (two) times daily.       metoprolol succinate (TOPROL-XL) 100 MG 24 hr tablet Take 1 tablet (  100 mg total) by mouth daily. 90 tablet 3    Home medication reconciliation was completed with the patient.   Scheduled Inpatient Medications:    amLODipine  5 mg Oral Daily   calcium acetate  1,334 mg Oral TID with meals   metoprolol succinate  100 mg Oral Daily   nicotine  14 mg Transdermal Daily   [START ON 03/07/2021] pantoprazole (PROTONIX) IV  40 mg Intravenous Q12H   sertraline  25 mg Oral Daily   sodium bicarbonate  1,300 mg Oral BID    Continuous Inpatient Infusions:    PRN Inpatient Medications:  acetaminophen, albuterol, dextromethorphan-guaiFENesin, hydrALAZINE, ondansetron (ZOFRAN) IV  Family History: family history includes CAD in his mother and sister; Diabetes in his mother and sister; Kidney disease in his mother.  The patient's family history is negative for inflammatory bowel disorders, GI malignancy, or solid organ  transplantation.  Social History:   reports that he has been smoking cigarettes. He has a 20.00 pack-year smoking history. He has never used smokeless tobacco. He reports current alcohol use. He reports that he does not use drugs. The patient denies ETOH, tobacco, or drug use.   Review of Systems: Constitutional: Weight is stable.  Eyes: No changes in vision. ENT: No oral lesions, sore throat.  GI: see HPI.  Heme/Lymph: No easy bruising.  CV: No chest pain.  GU: No hematuria.  Integumentary: No rashes.  Neuro: No headaches.  Psych: No depression/anxiety.  Endocrine: No heat/cold intolerance.  Allergic/Immunologic: No urticaria.  Resp: No cough, SOB.  Musculoskeletal: No joint swelling.    Physical Examination: BP 135/62   Pulse 95   Temp 97.9 F (36.6 C) (Oral)   Resp 17   Ht '5\' 4"'$  (1.626 m)   Wt 63.7 kg   SpO2 98%   BMI 24.11 kg/m  Gen: NAD, alert and oriented x 4 HEENT: PEERLA, EOMI, Neck: supple, no JVD or thyromegaly Chest: CTA bilaterally, no wheezes, crackles, or other adventitious sounds CV: RRR, no m/g/c/r Abd: soft, nondistended, +BS in all four quadrants; no HSM, guarding, ridigity, or rebound tenderness Ext: no edema, well perfused with 2+ pulses, Skin: no rash or lesions noted Lymph: no LAD  Data: Lab Results  Component Value Date   WBC 12.2 (H) 03/06/2021   HGB 7.9 (L) 03/06/2021   HCT 23.7 (L) 03/06/2021   MCV 92.9 03/06/2021   PLT 184 03/06/2021   Recent Labs  Lab 03/06/21 1329 03/06/21 1657  HGB 8.5* 7.9*   Lab Results  Component Value Date   NA 140 03/06/2021   K 5.5 (H) 03/06/2021   CL 104 03/06/2021   CO2 8 (L) 03/06/2021   BUN 136 (H) 03/06/2021   CREATININE 27.25 (H) 03/06/2021   Lab Results  Component Value Date   ALT 17 03/06/2021   AST 12 (L) 03/06/2021   ALKPHOS 62 03/06/2021   BILITOT 1.0 03/06/2021   Recent Labs  Lab 03/06/21 1657  APTT 35  INR 1.3*   Assessment/Plan:  55 y/o Caucasian male with a PMH of ESRD  on peritoneal dialysis, PAF, HTN presented to the [facility] ED from home this afternoon for chief complaint of generalized weakness and nausea and vomiting with coffee-ground emesis.    1. Coffee-ground emesis/Anemia c/wUGIB - DDx includes erosive esophagitis, peptic ulcer disease, gastritis +/- H pylori, duodenitis, AVMs, adenocarcinoma, varices, Dieulafoy's lesion, etc  2. Hyperkalemia - 7.3 on presentation. He has received tx with D50, insulin, sodium bicarbonate, and Patiromer. Repeat K 5.5.  3. ESRD on peritoneal dialysis - Nephrology consulted, plan for peritoneal dialysis session tonight.   4. Multifocal pneumonia - c/w clinical presentation of productive cough and CXR findings with hazy infiltrates in left lung. WBC 15.2 on admission. He is on empiric broad-spectrum antibiotics with Rocephin.   5. NSAID use - ibuprofen 800 mg as needed possibly contributory to his UGIB and worsening renal function. Counseled on importance of avoidance of NSAIDs.   COVID-19 and flu : NEGATIVE   Recommendations:  - H&H stable currently but downtrending c/w UGIB. Hgb 7.9 currently with no evidence of overt gastrointestinal bleeding.  - Continue to check serial H&H. Transfuse for Hgb <7.0.  - Agree with Protonix IV for gastric protection - Clear liquid diet okay for now. NPO after midnight.  - Advise EGD when clinically feasible, ideally with potassium 5.5 or lower and when respiratory status is optimized.  - Tentatively plan for EGD tomorrow if potassium is acceptable and respiratory status is optimized. Orders placed.  - If there is evidence of ongoing coffee-ground emesis with precipitous drop in H&H, please call Dr. Alice Reichert for emergent EGD.  - GI will continue to follow along with you.    I reviewed the risks (including bleeding, perforation, infection, anesthesia complications, cardiac/respiratory complications), benefits and alternatives of EGD. Patient consents to proceed.     Thank you for the  consult. Please call with questions or concerns.  Reeves For[facility] [phone] (Cell)

## 2021-03-06 NOTE — Consult Note (Signed)
Pharmacy Antibiotic Note  Larry [name] is a 55 y.o. male admitted on 03/06/2021 with pneumonia. Pharmacy has been consulted for cefepime and vancomycin dosing.  Plan: Vancomycin 1500 mg IV loading dose, will order vancomycin random level in 24 hours to assess need to re-dose  Cefepime 1 g IV q24h  Monitor clinical picture and vancomycin levels as needed F/U C&S, abx deescalation / LOT   Height: '5\' 4"'$  (162.6 cm) Weight: 63.7 kg (140 lb 6.9 oz) IBW/kg (Calculated) : 59.2  Temp (24hrs), Avg:97.8 F (36.6 C), Min:97.5 F (36.4 C), Max:98.4 F (36.9 C)  Recent Labs  Lab 03/06/21 1329 03/06/21 1611 03/06/21 1657 03/06/21 1711  WBC 15.2*  --  12.2*  --   CREATININE 34.05*  --   --  27.25*  LATICACIDVEN  --  0.9  --   --     Estimated Creatinine Clearance: 2.6 mL/min (A) (by C-G formula based on SCr of 27.25 mg/dL (H)).    Allergies  Allergen Reactions   Mushroom Extract Complex     Headache, migraines   Amoxicillin Nausea And Vomiting    Weakness  Has patient had a PCN reaction causing immediate rash, facial/tongue/throat swelling, SOB or lightheadedness with hypotension: No Has patient had a PCN reaction causing severe rash involving mucus membranes or skin necrosis: No Has patient had a PCN reaction that required hospitalization: No Has patient had a PCN reaction occurring within the last 10 years: No If all of the above answers are "NO", then may proceed with Cephalosporin use.     Antimicrobials this admission: 6/15 azithromycin x 1 6/15 ceftriaxone x 1 6/15 vancomycin >>  6/15 cefepime >>   Dose adjustments this admission: N/A  Microbiology results: 6/15 BCx: pending 6/15 Sputum: pending 6/15 MRSA PCR: ordered  Thank you for allowing pharmacy to be a part of this patient's care.  Darnelle Bos, PharmD 03/06/2021 9:35 PM

## 2021-03-06 NOTE — ED Notes (Signed)
Pt to xray

## 2021-03-06 NOTE — ED Provider Notes (Signed)
[facility] Emergency Department Provider Note  ____________________________________________  Time seen: Approximately 3:38 PM  I have reviewed the triage vital signs and the nursing notes.   HISTORY  Chief Complaint Abdominal Pain and Weakness    HPI Larry [name] is a 55 y.o. male with a history of ESRD on peritoneal dialysis, paroxysmal atrial fibrillation, hypertension who comes ED complaining of generalized weakness, body aches, nausea vomiting with hematemesis.  Also complains of chills.  Symptoms are constant, ongoing for the last 2 or 3 days.  Denies black or bloody stool, denies diarrhea.    Past Medical History:  Diagnosis Date   Anemia    ESRD on peritoneal dialysis ([place])    Hypertension    Nephrotic syndrome      Patient Active Problem List   Diagnosis Date Noted   Pericarditis 12/09/2020   Uremia syndrome 12/09/2020   Paroxysmal atrial fibrillation (HCC)    HTN (hypertension) 12/06/2020   Nicotine dependence 12/06/2020   Intractable vomiting with nausea 12/06/2020   ESRD on peritoneal dialysis ([place]) 10/29/2020   Renal dialysis device, implant, or graft complication [id number]   Open displaced fracture of proximal phalanx of left thumb 02/29/2020   Anemia of chronic kidney failure, stage 5 ([place]) 12/28/2017   Hydronephrosis 10/15/2017   AKI (acute kidney injury) ([place]) 09/30/2017     Past Surgical History:  Procedure Laterality Date   CAPD INSERTION N/A 01/06/2018   Procedure: LAPAROSCOPIC INSERTION CONTINUOUS AMBULATORY PERITONEAL DIALYSIS  (CAPD) CATHETER;  Surgeon: Katha Cabal, MD;  Location: [facility] ORS;  Service: Vascular;  Laterality: N/A;   CHOLECYSTECTOMY     CYSTOSCOPY W/ URETERAL STENT REMOVAL Right 10/23/2017   Procedure: CYSTOSCOPY WITH STENT REPLACEMENT;  Surgeon: [name], MD;  Location: [facility] ORS;  Service: Urology;  Laterality: Right;   CYSTOSCOPY WITH STENT PLACEMENT Right 09/30/2017   Procedure:  CYSTOSCOPY WITH STENT PLACEMENT;  Surgeon: [name], MD;  Location: [facility] ORS;  Service: Urology;  Laterality: Right;   CYSTOSCOPY/RETROGRADE/URETEROSCOPY Right 10/23/2017   Procedure: CYSTOSCOPY/RETROGRADE/URETEROSCOPY;  Surgeon: [name], MD;  Location: [facility] ORS;  Service: Urology;  Laterality: Right;   RENAL BIOPSY     TEMPORARY DIALYSIS CATHETER N/A 12/07/2020   Procedure: TEMPORARY DIALYSIS CATHETER;  Surgeon: Katha Cabal, MD;  Location: [facility];  Service: Cardiovascular;  Laterality: N/A;     Prior to Admission medications   Medication Sig Start Date End Date Taking? Authorizing Provider  acetaminophen (TYLENOL) 500 MG tablet Take by mouth.    [provider]  amLODipine (NORVASC) 10 MG tablet Take 1 tablet (10 mg total) by mouth daily. 01/16/21   Minna Merritts, MD  calcium acetate (PHOSLO) 667 MG capsule Take 1,334 mg by mouth 3 (three) times daily. 12/03/20   [provider]  famotidine (PEPCID) 20 MG tablet Take 20 mg by mouth 2 (two) times daily as needed for heartburn or indigestion.    [provider]  hydrALAZINE (APRESOLINE) 50 MG tablet Take 50 mg by mouth daily as needed (If diastolic bp is 90 or above).     [provider]  metoprolol succinate (TOPROL-XL) 100 MG 24 hr tablet Take 1 tablet (100 mg total) by mouth daily. 01/16/21   Minna Merritts, MD  nicotine (NICODERM CQ - DOSED IN MG/24 HOURS) 14 mg/24hr patch Place 14 mg onto the skin daily.    [provider]  ondansetron (ZOFRAN-ODT) 4 MG disintegrating tablet Take 4 mg by mouth  every 8 (eight) hours as needed. 12/05/20   [provider]  oxybutynin (DITROPAN) 5 MG tablet Take 5 mg by mouth 2 (two) times daily. Patient not taking: Reported on 01/16/2021 01/18/18   [provider]  sertraline (ZOLOFT) 25 MG tablet Take 25 mg by mouth daily.  09/25/17   [provider]  sodium bicarbonate 650 MG tablet Take 1,300 mg by  mouth 2 (two) times daily.     [provider]     Allergies Mushroom extract complex and Amoxicillin   Family History  Problem Relation Age of Onset   Diabetes Mother    Kidney disease Mother        mother on dialysis   CAD Mother    Diabetes Sister    CAD Sister     Social History Social History   Tobacco Use   Smoking status: Heavy Smoker    Packs/day: 1.00    Years: 20.00    Pack years: 20.00    Types: Cigarettes    Last attempt to quit: 08/22/2017    Years since quitting: 3.5   Smokeless tobacco: Never  Vaping Use   Vaping Use: Never used  Substance Use Topics   Alcohol use: Yes    Comment: occ. alcohol use   Drug use: No    Review of Systems  Constitutional:   No fever positive chills.  ENT:   No sore throat. No rhinorrhea. Cardiovascular:   No chest pain or syncope. Respiratory:   Positive shortness of breath without cough. Gastrointestinal:   Negative for abdominal pain, positive vomiting.  Musculoskeletal:   Negative for focal pain or swelling All other systems reviewed and are negative except as documented above in ROS and HPI.  ____________________________________________   PHYSICAL EXAM:  VITAL SIGNS: ED Triage Vitals  Enc Vitals Group     BP 03/06/21 1329 (!) 153/64     Pulse Rate 03/06/21 1329 93     Resp 03/06/21 1355 18     Temp --      Temp src --      SpO2 03/06/21 1329 96 %     Weight --      Height --      Head Circumference --      Peak Flow --      Pain Score 03/06/21 1402 10     Pain Loc --      Pain Edu? --      Excl. in [place]? --     Vital signs reviewed, nursing assessments reviewed.   Constitutional:   Alert and oriented.  Ill-appearing. Eyes:   Conjunctivae are normal. EOMI. PERRL. ENT      Head:   Normocephalic and atraumatic.      Nose:   Normal      Mouth/Throat:   Dry mucous membranes      Neck:   No meningismus. Full ROM. Hematological/Lymphatic/Immunilogical:   No cervical  lymphadenopathy. Cardiovascular:   RRR. Symmetric bilateral radial and DP pulses.  No murmurs. Cap refill less than 2 seconds. Respiratory:   Normal respiratory effort without tachypnea/retractions. Breath sounds are clear and equal bilaterally. No wheezes/rales/rhonchi. Gastrointestinal:   Soft and nontender. Non distended. There is no CVA tenderness.  No rebound, rigidity, or guarding.  PD catheter entry site appears clean and noninflamed.  Rectal exam reveals a small external hemorrhoid.  Thin brown stool in the rectum, Hemoccult positive. Genitourinary:   deferred Musculoskeletal:   Normal range of motion in all  extremities. No joint effusions.  No lower extremity tenderness.  No edema. Neurologic:   Normal speech and language.  Motor grossly intact. No acute focal neurologic deficits are appreciated.  Skin:    Skin is warm, dry and intact. No rash noted.  No petechiae, purpura, or bullae.  ____________________________________________    LABS (pertinent positives/negatives) (all labs ordered are listed, but only abnormal results are displayed) Labs Reviewed  BASIC METABOLIC PANEL - Abnormal; Notable for the following components:      Result Value   Potassium 7.3 (*)    Chloride 97 (*)    CO2 <7 (*)    Glucose, Bld 104 (*)    BUN 149 (*)    Creatinine, Ser 34.05 (*)    Calcium 8.2 (*)    GFR, Estimated 1 (*)    All other components within normal limits  CBC - Abnormal; Notable for the following components:   WBC 15.2 (*)    RBC 2.69 (*)    Hemoglobin 8.5 (*)    HCT 25.8 (*)    RDW 16.6 (*)    nRBC 0.3 (*)    All other components within normal limits  RESP PANEL BY RT-PCR (FLU A&B, COVID) ARPGX2  URINALYSIS, COMPLETE (UACMP) WITH MICROSCOPIC  HEPATIC FUNCTION PANEL  LIPASE, BLOOD  PROCALCITONIN  CBG MONITORING, ED   ____________________________________________   EKG  Interpreted by me Normal sinus rhythm rate of 91, normal axis and intervals.  Normal QRS and ST  segments.  Peaked T waves in the anterior leads.  ____________________________________________    RADIOLOGY  No results found.  ____________________________________________   PROCEDURES .Critical Care  Date/Time: 03/06/2021 3:44 PM Performed by: Carrie Mew, MD Authorized by: Carrie Mew, MD   Critical care provider statement:    Critical care time (minutes):  40   Critical care time was exclusive of:  Separately billable procedures and treating other patients   Critical care was necessary to treat or prevent imminent or life-threatening deterioration of the following conditions:  Renal failure, metabolic crisis and cardiac failure   Critical care was time spent personally by me on the following activities:  Development of treatment plan with patient or surrogate, discussions with consultants, evaluation of patient's response to treatment, examination of patient, obtaining history from patient or surrogate, ordering and performing treatments and interventions, ordering and review of laboratory studies, ordering and review of radiographic studies, pulse oximetry, re-evaluation of patient's condition and review of old charts  ____________________________________________  DIFFERENTIAL DIAGNOSIS   Hyperkalemia, dehydration, viral illness, pneumonia, cystitis, upper GI bleed  CLINICAL IMPRESSION / ASSESSMENT AND PLAN / ED COURSE  Medications ordered in the ED: Medications  patiromer Daryll Drown) packet 25.2 g (has no administration in time range)  cefTRIAXone (ROCEPHIN) 2 g in sodium chloride 0.9 % 100 mL IVPB (has no administration in time range)  azithromycin (ZITHROMAX) 500 mg in sodium chloride 0.9 % 250 mL IVPB (has no administration in time range)  sodium chloride 0.9 % bolus 1,000 mL (1,000 mLs Intravenous New Bag/Given 03/06/21 1333)  calcium gluconate inj 10% (1 g) URGENT USE ONLY! (1 g Intravenous Given 03/06/21 1434)  sodium bicarbonate injection 50 mEq (50 mEq  Intravenous Given 03/06/21 1438)  insulin aspart (novoLOG) injection 10 Units (10 Units Intravenous Given 03/06/21 1432)    And  dextrose 50 % solution 100 mL (100 mLs Intravenous Given 03/06/21 1428)    Pertinent labs & imaging results that were available during my care of the patient were reviewed by  me and considered in my medical decision making (see chart for details).  Larry [name] was evaluated in Emergency Department on 03/06/2021 for the symptoms described in the history of present illness. He was evaluated in the context of the global COVID-19 pandemic, which necessitated consideration that the patient might be at risk for infection with the SARS-CoV-2 virus that causes COVID-19. Institutional protocols and algorithms that pertain to the evaluation of patients at risk for COVID-19 are in a state of rapid change based on information released by regulatory bodies including the CDC and federal and state organizations. These policies and algorithms were followed during the patient's care in the ED.   Patient presents with multiple constitutional symptoms, hematemesis.  Not in distress.  Labs reveal a potassium of 7.3, BUN of 150.  Hemoglobin is 8.5 which is near baseline.  COVID and flu are negative.  Clinical Course as of 03/06/21 1544  Wed Mar 06, 2021  1537 Chest x-ray viewed and interpreted by me, shows some hazy infiltrates in the left lung, suspicious for edema versus multifocal pneumonia.  We will give Rocephin for antibiotic coverage.  Case discussed with Dr. Zollie Scale will plan for peritoneal dialysis once patient is on the inpatient service [PS]    Clinical Course User Index [PS] Carrie Mew, MD     ____________________________________________   FINAL CLINICAL IMPRESSION(S) / ED DIAGNOSES    Final diagnoses:  ESRD on peritoneal dialysis [facility])  Hyperkalemia     ED Discharge Orders     None       Portions of this note were generated with dragon dictation  software. Dictation errors may occur despite best attempts at proofreading.   Carrie Mew, MD 03/06/21 1544

## 2021-03-06 NOTE — ED Triage Notes (Signed)
Pt comes into the ED via EMS from home. States he is on z pack for URI. Pt c/o body aches , N/V with initially coffee ground now dark green,  generalized weakness.   140/80 90 98%RA CBG87 #20g Left hand '4mg'$  Zofran given

## 2021-03-06 NOTE — Progress Notes (Signed)
[facility]  ROUNDING NOTE   Subjective:   Larry [name] is a 55 y.o. male with current medical problems consisting of hypertension, depression, anemia, nephrotic syndrome, GERD, PAF not on anticoagulation, and ESRD on PD. He presents to the ED with complaints of cough with coffee-ground emesis and progressive weakness.   We have been consulted to manage PD and hyperkalemia on arrival. Hhe is a current patient of Dr Lateef. He states he has been progressively getting weaker over the past couple weeks. He can not attribute the onset to an event or recent illness. He says he had a cough with sputum and was prescribed an antibiotic. Patient states he has had nausea, vomiting and diarrhea also. He states he has maintained his PD during this time. His potassium on arrival was 7.3. Other pertinent labs include Na 136, BUN 149, Crt 34.05, and eGFR 1.   Objective:  Vital signs in last 24 hours:  Pulse Rate:  [89-94] 89 (06/15 1430) Resp:  [18-22] 21 (06/15 1430) BP: (137-155)/(64-67) 137/65 (06/15 1430) SpO2:  [96 %-100 %] 100 % (06/15 1430)  Weight change:  There were no vitals filed for this visit.  Intake/Output: No intake/output data recorded.   Intake/Output this shift:  No intake/output data recorded.  Physical Exam: General: NAD, resting on stretcher  Head: Normocephalic, atraumatic. Moist oral mucosal membranes  Eyes: Anicteric  Neck: Supple, trachea midline  Lungs:  Clear to auscultation, normal effort  Heart: Regular rate and rhythm  Abdomen:  Soft, nontender, nondistended  Extremities:  no peripheral edema.  Neurologic: Nonfocal, moving all four extremities  Skin: No lesions  Access: PD catheter    Basic Metabolic Panel: Recent Labs  Lab 03/06/21 1329  NA 136  K 7.3*  CL 97*  CO2 <7*  GLUCOSE 104*  BUN 149*  CREATININE 34.05*  CALCIUM 8.2*    Liver Function Tests: No results for input(s): AST, ALT, ALKPHOS, BILITOT, PROT, ALBUMIN in the last  168 hours. No results for input(s): LIPASE, AMYLASE in the last 168 hours. No results for input(s): AMMONIA in the last 168 hours.  CBC: Recent Labs  Lab 03/06/21 1329  WBC 15.2*  HGB 8.5*  HCT 25.8*  MCV 95.9  PLT 210    Cardiac Enzymes: No results for input(s): CKTOTAL, CKMB, CKMBINDEX, TROPONINI in the last 168 hours.  BNP: Invalid input(s): POCBNP  CBG: No results for input(s): GLUCAP in the last 168 hours.  Microbiology: Results for orders placed or performed during the hospital encounter of 03/06/21  Resp Panel by RT-PCR (Flu A&B, Covid) Nasopharyngeal Swab     Status: None   Collection Time: 03/06/21  2:01 PM   Specimen: Nasopharyngeal Swab; Nasopharyngeal(NP) swabs in vial transport medium  Result Value Ref Range Status   SARS Coronavirus 2 by RT PCR NEGATIVE NEGATIVE Final    Comment: (NOTE) SARS-CoV-2 target nucleic acids are NOT DETECTED.  The SARS-CoV-2 RNA is generally detectable in upper respiratory specimens during the acute phase of infection. The lowest concentration of SARS-CoV-2 viral copies this assay can detect is 138 copies/mL. A negative result does not preclude SARS-Cov-2 infection and should not be used as the sole basis for treatment or other patient management decisions. A negative result may occur with  improper specimen collection/handling, submission of specimen other than nasopharyngeal swab, presence of viral mutation(s) within the areas targeted by this assay, and inadequate number of viral copies(<138 copies/mL). A negative result must be combined with clinical observations, patient history, and   epidemiological information. The expected result is Negative.  Fact Sheet for Patients:  https://www.fda.gov/media/152166/download  Fact Sheet for Healthcare Providers:  https://www.fda.gov/media/152162/download  This test is no t yet approved or cleared by the [place] FDA and  has been authorized for detection and/or diagnosis of  SARS-CoV-2 by FDA under an Emergency Use Authorization (EUA). This EUA will remain  in effect (meaning this test can be used) for the duration of the COVID-19 declaration under Section 564(b)(1) of the Act, 21 U.S.C.section 360bbb-3(b)(1), unless the authorization is terminated  or revoked sooner.       Influenza A by PCR NEGATIVE NEGATIVE Final   Influenza B by PCR NEGATIVE NEGATIVE Final    Comment: (NOTE) The Xpert Xpress SARS-CoV-2/FLU/RSV plus assay is intended as an aid in the diagnosis of influenza from Nasopharyngeal swab specimens and should not be used as a sole basis for treatment. Nasal washings and aspirates are unacceptable for Xpert Xpress SARS-CoV-2/FLU/RSV testing.  Fact Sheet for Patients: https://www.fda.gov/media/152166/download  Fact Sheet for Healthcare Providers: https://www.fda.gov/media/152162/download  This test is not yet approved or cleared by the [place] FDA and has been authorized for detection and/or diagnosis of SARS-CoV-2 by FDA under an Emergency Use Authorization (EUA). This EUA will remain in effect (meaning this test can be used) for the duration of the COVID-19 declaration under Section 564(b)(1) of the Act, 21 U.S.C. section 360bbb-3(b)(1), unless the authorization is terminated or revoked.  Performed at [facility], [address] [place], [place] [address]     Coagulation Studies: No results for input(s): LABPROT, INR in the last 72 hours.  Urinalysis: No results for input(s): COLORURINE, LABSPEC, PHURINE, GLUCOSEU, HGBUR, BILIRUBINUR, KETONESUR, PROTEINUR, UROBILINOGEN, NITRITE, LEUKOCYTESUR in the last 72 hours.  Invalid input(s): APPERANCEUR    Imaging: DG Abdomen Acute W/Chest  Result Date: 03/06/2021 CLINICAL DATA:  Weakness EXAM: DG ABDOMEN ACUTE WITH 1 VIEW CHEST COMPARISON:  Chest x-ray 12/06/2020, abdominal radiograph 12/07/2020 FINDINGS: Single-view chest demonstrates cardiomegaly with vascular  congestion. Diffuse bilateral interstitial opacity with patchy ground-glass opacity in the left thorax. No pneumothorax. Supine and upright views of the abdomen demonstrate no free air beneath the diaphragm. Surgical clips in the right upper quadrant. Nonobstructed gas pattern. Peritoneal dialysis catheter coiled over the left mid abdomen. No radiopaque calculi. IMPRESSION: 1. Cardiomegaly with vascular congestion and diffuse bilateral interstitial opacity probably due to pulmonary edema. Patchy ground-glass opacities in the left thorax may be due to slight asymmetric edema versus superimposed pneumonia 2. Nonobstructed gas pattern Electronically Signed   By: Kim  Fujinaga M.D.   On: 03/06/2021 15:43     Medications:    azithromycin (ZITHROMAX) 500 MG IVPB (Vial-Mate [employer])     cefTRIAXone (ROCEPHIN)  IV      pantoprazole (PROTONIX) IV  40 mg Intravenous Once   patiromer  25.2 g Oral STAT     Assessment/ Plan:  Larry [name] is a 55 y.o.  male with current medical problems consisting of hypertension, depression, anemia, nephrotic syndrome, GERD, PAF not on anticoagulation, and ESRD on PD. He presents to the ED with complaints of cough with coffee-ground emesis and progressive weakness.   Cycler: 4 fills/2L per fill/15min drain/10min fill/1.45 dwell   End Stage Renal Disease on PD Will continue current PD schedule while inpatient  Lab Results  Component Value Date   CREATININE 34.05 (H) 03/06/2021   CREATININE 19.42 (H) 12/08/2020   CREATININE 22.99 (H) 12/06/2020   No intake or output data in the 24 hours   ending 03/06/21 1603  2. Hyperkalemia  On admission 7.3 Given [place] Hyperkalemia protocol with D50, Insulin, and Calcium gluconate Recheck is 5.5  3. Anemia of chronic kidney disease/  Lab Results  Component Value Date   HGB 7.9 (L) 03/06/2021   Monthly EPO outpateint Will continue to monitor  4. Secondary Hyperparathyroidism:  Lab Results  Component Value  Date   CALCIUM 8.1 (L) 03/06/2021   CAION 1.07 (L) 10/03/2020   PHOS 11.5 (H) 12/08/2020   Elevated phosphorus and decreased calcium Calcium Acetate with meals    LOS: 0 Shantelle Breeze 6/15/20224:03 PM   

## 2021-03-07 ENCOUNTER — Inpatient Hospital Stay: Payer: Medicare Other | Admitting: Anesthesiology

## 2021-03-07 ENCOUNTER — Encounter: Payer: Self-pay | Admitting: Internal Medicine

## 2021-03-07 ENCOUNTER — Encounter
Admission: EM | Disposition: A | Discharge status: Home / Self Care | Payer: Self-pay | Source: Home / Self Care | Attending: Internal Medicine

## 2021-03-07 DIAGNOSIS — N19 Unspecified kidney failure: Secondary | ICD-10-CM | POA: Diagnosis not present

## 2021-03-07 HISTORY — PX: ESOPHAGOGASTRODUODENOSCOPY: SHX5428

## 2021-03-07 LAB — BASIC METABOLIC PANEL
Anion gap: 27 — ABNORMAL HIGH (ref 5–15)
BUN: 208 mg/dL — ABNORMAL HIGH (ref 6–20)
CO2: 10 mmol/L — ABNORMAL LOW (ref 22–32)
Calcium: 8.5 mg/dL — ABNORMAL LOW (ref 8.9–10.3)
Chloride: 103 mmol/L (ref 98–111)
Creatinine, Ser: 30.76 mg/dL — ABNORMAL HIGH (ref 0.61–1.24)
GFR, Estimated: 1 mL/min — ABNORMAL LOW (ref >60)
Glucose, Bld: 217 mg/dL — ABNORMAL HIGH (ref 70–99)
Potassium: 4.3 mmol/L (ref 3.5–5.1)
Sodium: 140 mmol/L (ref 135–145)

## 2021-03-07 LAB — MRSA PCR SCREENING: MRSA by PCR: NEGATIVE

## 2021-03-07 LAB — CBC
HCT: 22.8 % — ABNORMAL LOW (ref 39.0–52.0)
Hemoglobin: 7.6 g/dL — ABNORMAL LOW (ref 13.0–17.0)
MCH: 31 pg (ref 26.0–34.0)
MCHC: 33.3 g/dL (ref 30.0–36.0)
MCV: 93.1 fL (ref 80.0–100.0)
Platelets: 179 10*3/uL (ref 150–400)
RBC: 2.45 MIL/uL — ABNORMAL LOW (ref 4.22–5.81)
RDW: 16.2 % — ABNORMAL HIGH (ref 11.5–15.5)
WBC: 9.6 10*3/uL (ref 4.0–10.5)
nRBC: 0.2 % (ref 0.0–0.2)

## 2021-03-07 LAB — GLUCOSE, CAPILLARY
Glucose-Capillary: 227 mg/dL — ABNORMAL HIGH (ref 70–99)
Glucose-Capillary: 126 mg/dL — ABNORMAL HIGH (ref 70–99)

## 2021-03-07 SURGERY — EGD (ESOPHAGOGASTRODUODENOSCOPY)
Anesthesia: General

## 2021-03-07 MED ORDER — PROPOFOL 10 MG/ML IV BOLUS
INTRAVENOUS | Status: DC | PRN
  Administered 2021-03-07: 140 mg via INTRAVENOUS

## 2021-03-07 MED ORDER — HEPARIN 1000 UNIT/ML FOR PERITONEAL DIALYSIS
500.0000 [IU] | INTRAMUSCULAR | Status: DC | PRN
  Filled 2021-03-07: qty 0.5

## 2021-03-07 MED ORDER — AZITHROMYCIN 250 MG PO TABS: 500.0000 mg | ORAL_TABLET | Freq: Once | ORAL | Status: DC

## 2021-03-07 MED ORDER — PROMETHAZINE HCL 25 MG/ML IJ SOLN
12.5000 mg | INTRAMUSCULAR | Status: DC | PRN
  Filled 2021-03-07: qty 1

## 2021-03-07 MED ORDER — GENTAMICIN SULFATE 0.1 % EX CREA
1.0000 "application " | TOPICAL_CREAM | Freq: Every day | CUTANEOUS | Status: DC
  Administered 2021-03-08 – 2021-03-12 (×3): 1 via TOPICAL
  Filled 2021-03-07: qty 15

## 2021-03-07 MED ORDER — SODIUM CHLORIDE 0.9 % IV SOLN
2.0000 g | INTRAVENOUS | Status: DC
  Administered 2021-03-07: 2 g via INTRAVENOUS
  Filled 2021-03-07 (×3): qty 20

## 2021-03-07 MED ORDER — LIDOCAINE HCL (CARDIAC) PF 100 MG/5ML IV SOSY
PREFILLED_SYRINGE | INTRAVENOUS | Status: DC | PRN
  Administered 2021-03-07: 40 mg via INTRAVENOUS

## 2021-03-07 MED ORDER — NITROGLYCERIN 2 % TD OINT
0.5000 [in_us] | TOPICAL_OINTMENT | Freq: Four times a day (QID) | TRANSDERMAL | Status: DC
  Administered 2021-03-07 – 2021-03-11 (×14): 0.5 [in_us] via TOPICAL
  Filled 2021-03-07 (×14): qty 1

## 2021-03-07 MED ORDER — DELFLEX-LC/2.5% DEXTROSE 394 MOSM/L IP SOLN
INTRAPERITONEAL | Status: DC
  Filled 2021-03-07 (×4): qty 3000

## 2021-03-07 MED ORDER — SODIUM CHLORIDE 0.9 % IV SOLN
12.5000 mg | INTRAVENOUS | Status: DC | PRN
  Administered 2021-03-07 – 2021-03-11 (×13): 12.5 mg via INTRAVENOUS
  Filled 2021-03-07: qty 12.5
  Filled 2021-03-07 (×4): qty 0.5
  Filled 2021-03-07: qty 12.5
  Filled 2021-03-07: qty 0.5
  Filled 2021-03-07 (×3): qty 12.5
  Filled 2021-03-07: qty 0.5
  Filled 2021-03-07: qty 12.5
  Filled 2021-03-07: qty 0.5

## 2021-03-07 MED ORDER — AZITHROMYCIN 250 MG PO TABS: 250.0000 mg | ORAL_TABLET | Freq: Every day | ORAL | Status: DC

## 2021-03-07 MED ORDER — AZITHROMYCIN 250 MG PO TABS
250.0000 mg | ORAL_TABLET | Freq: Every day | ORAL | Status: DC
  Administered 2021-03-08 – 2021-03-10 (×3): 250 mg via ORAL
  Filled 2021-03-07 (×3): qty 1

## 2021-03-07 MED ORDER — SODIUM CHLORIDE 0.9 % IV SOLN: INTRAVENOUS | Status: DC

## 2021-03-07 MED ORDER — PROPOFOL 500 MG/50ML IV EMUL
INTRAVENOUS | Status: DC | PRN
  Administered 2021-03-07: 150 ug/kg/min via INTRAVENOUS

## 2021-03-07 NOTE — Interval H&P Note (Signed)
History and Physical Interval Note:  03/07/2021 1:34 PM  Larry [name]  has presented today for surgery, with the diagnosis of Coffee-ground emesis, anemia.  The various methods of treatment have been discussed with the patient and family. After consideration of risks, benefits and other options for treatment, the patient has consented to  Procedure(s): ESOPHAGOGASTRODUODENOSCOPY (EGD) (N/A) as a surgical intervention.  The patient's history has been reviewed, patient examined, no change in status, stable for surgery.  I have reviewed the patient's chart and labs.  Questions were answered to the patient's satisfaction.     [place], [place]

## 2021-03-07 NOTE — Progress Notes (Signed)
Patient followup potassium 6.1. MD Mansy notified. Orders received; Patient next lab draw at 0500. MD Mansy would like to keep redraw at this time. Dialysis RN contacted in regards to patient receiving scheduled PD tonight. Per dialysis RN, she is unable to complete PD but there is another dialysis RN coming in to place patient on PD.   Update: Patient placed on PD machine. Machine infusing without incident. Patient resting comfortably in bed with eyes closed. NADN. No reported concerns at this time. Bed in lowest position, call bell within reach. Bed alarm on.   Update: Patients PD machine beeping alarming low drain volume. Service line called as instructed by dialysis RN. Patient repositioned. Machine is now infusing without issues.   Update: Patient vomuited x2. No PRNS currently available. MD Mansy notified. Orders received.

## 2021-03-07 NOTE — Anesthesia Postprocedure Evaluation (Signed)
Anesthesia Post Note  Patient: Larry [name]  Procedure(s) Performed: ESOPHAGOGASTRODUODENOSCOPY (EGD)  Patient location during evaluation: PACU Anesthesia Type: General and Combined Spinal/Epidural Level of consciousness: awake and alert, awake and oriented Pain management: pain level controlled Vital Signs Assessment: post-procedure vital signs reviewed and stable Respiratory status: spontaneous breathing, nonlabored ventilation and respiratory function stable Cardiovascular status: blood pressure returned to baseline and stable Postop Assessment: no apparent nausea or vomiting Anesthetic complications: no   No notable events documented.   Last Vitals:  Vitals:   03/07/21 1520 03/07/21 1550  BP: (!) 169/67 (!) 159/72  Pulse: (!) 103 98  Resp: 18 18  Temp:    SpO2: 99% 100%    Last Pain:  Vitals:   03/07/21 1450  TempSrc: Temporal  PainSc: Asleep                 Phill Mutter

## 2021-03-07 NOTE — Progress Notes (Signed)
[place] [place] NAME: Larry [name]    MR#:  [id number]  DATE OF BIRTH:  22-Mar-1966  SUBJECTIVE:  patient came in with weakness cough overall not feeling well and significant hiccups. He had coffee ground emesis at home.  wife and daughter in the room. Patient looks not well.   REVIEW OF SYSTEMS:   Review of Systems  Constitutional:  Positive for malaise/fatigue. Negative for chills, fever and weight loss.  HENT:  Negative for ear discharge, ear pain and nosebleeds.   Eyes:  Negative for blurred vision, pain and discharge.  Respiratory:  Positive for cough and shortness of breath. Negative for sputum production, wheezing and stridor.   Cardiovascular:  Negative for chest pain, palpitations, orthopnea and PND.  Gastrointestinal:  Negative for abdominal pain, diarrhea, nausea and vomiting.       Hiccups  Genitourinary:  Negative for frequency and urgency.  Musculoskeletal:  Negative for back pain and joint pain.  Neurological:  Positive for weakness. Negative for sensory change, speech change and focal weakness.  Psychiatric/Behavioral:  Negative for depression and hallucinations. The patient is not nervous/anxious.   Tolerating Diet:npo Tolerating PT:   DRUG ALLERGIES:   Allergies  Allergen Reactions   Mushroom Extract Complex     Headache, migraines   Amoxicillin Nausea And Vomiting    Weakness  Has patient had a PCN reaction causing immediate rash, facial/tongue/throat swelling, SOB or lightheadedness with hypotension: No Has patient had a PCN reaction causing severe rash involving mucus membranes or skin necrosis: No Has patient had a PCN reaction that required hospitalization: No Has patient had a PCN reaction occurring within the last 10 years: No If all of the above answers are "NO", then may proceed with Cephalosporin use.     VITALS:  Blood pressure 137/67, pulse (!) 115, temperature 99.2 F (37.3 C), temperature  source Temporal, resp. rate (!) 26, height '5\' 4"'$  (1.626 m), weight 69.5 kg, SpO2 99 %.  PHYSICAL EXAMINATION:   Physical Exam  GENERAL:  55 y.o.-year-old patient lying in the bed with no acute distress. Chronically ill HEENT: Head atraumatic, normocephalic. Oropharynx and nasopharynx clear.  LUNGS: Normal breath sounds bilaterally, no wheezing, rales, rhonchi. No use of accessory muscles of respiration.  CARDIOVASCULAR: S1, S2 normal. No murmurs, rubs, or gallops.  ABDOMEN: Soft, nontender, nondistended. Bowel sounds present. No organomegaly or mass. PD cath+ EXTREMITIES: No cyanosis, clubbing or edema b/l.    NEUROLOGIC: nonfocal, weak PSYCHIATRIC:  patient is alert and oriented x 3.  SKIN: No obvious rash, lesion, or ulcer.   LABORATORY PANEL:  CBC Recent Labs  Lab 03/07/21 0519  WBC 9.6  HGB 7.6*  HCT 22.8*  PLT 179    Chemistries  Recent Labs  Lab 03/06/21 1329 03/06/21 1711 03/07/21 0519  NA 136   < > 140  K 7.3*   < > 4.3  CL 97*   < > 103  CO2 <7*   < > 10*  GLUCOSE 104*   < > 217*  BUN 149*   < > 208*  CREATININE 34.05*   < > 30.76*  CALCIUM 8.2*   < > 8.5*  AST 12*  --   --   ALT 17  --   --   ALKPHOS 62  --   --   BILITOT 1.0  --   --    < > = values in this interval not displayed.   Cardiac Enzymes No  results for input(s): TROPONINI in the last 168 hours. RADIOLOGY:  DG Abdomen Acute W/Chest  Result Date: 03/06/2021 CLINICAL DATA:  Weakness EXAM: DG ABDOMEN ACUTE WITH 1 VIEW CHEST COMPARISON:  Chest x-ray 12/06/2020, abdominal radiograph 12/07/2020 FINDINGS: Single-view chest demonstrates cardiomegaly with vascular congestion. Diffuse bilateral interstitial opacity with patchy ground-glass opacity in the left thorax. No pneumothorax. Supine and upright views of the abdomen demonstrate no free air beneath the diaphragm. Surgical clips in the right upper quadrant. Nonobstructed gas pattern. Peritoneal dialysis catheter coiled over the left mid abdomen. No  radiopaque calculi. IMPRESSION: 1. Cardiomegaly with vascular congestion and diffuse bilateral interstitial opacity probably due to pulmonary edema. Patchy ground-glass opacities in the left thorax may be due to slight asymmetric edema versus superimposed pneumonia 2. Nonobstructed gas pattern Electronically Signed   By: Donavan Foil M.D.   On: 03/06/2021 15:43   ASSESSMENT AND PLAN:   Larry [name] is a 55 y.o. male with medical history significant of ESRD-peritoneal dialysis, nephrotic syndrome, HTN, GERD, depression, anemia, PAF not on anticoagulants, who presents with productive cough, coffee-ground emesis and weakness.  Patient states that he has been sick for several days. He has cough with thick yellow-colored sputum production.  Patient is taking Z-Pak for URI currently.    Uremia and hyperkalemia: K 7.3. Has T-wave peaking on EKG.  --Consulted Dr. Holley Raring of renal. -In the ER--Patient was treated for hyperkalemia with D50, NovoLog 10 unit, 50 mEq of sodium bicarbonate, patiromer 25.2 g --K down to 4.3  Sepsis due to HCAP (healthcare-associated pneumonia): Patient meets criteria for sepsis with leukocytosis with WBC 15.2, tachycardia with heart rate of 99, and tachypnea with RR 22.  Patient has productive cough.  -- X-ray showed possible infiltration, indicating possible pneumonia/HCAP.  Lactic acid normal 0.9.  Procalcitonin is elevated 1.04. - IV  Rocephin and azithromycin  - Mucinex for cough - Bronchodilators - Follow up blood culture negative --MRSA PCR neg -Received  IVF: 1L of NS bolus in ED   ESRD on peritoneal dialysis [facility]) -renal is consulted for  Peritoneal dialysis   HTN (hypertension) -IV hydralazine as needed -Amlodipine, metoprolol   Nicotine dependence -Nicotine patch   Paroxysmal atrial fibrillation ([name]): Patient's not taking anticoagulants. -Continue home metoprolol   Coffee ground emesis and anemia in ESRD:  --Hemoglobin 9.1 on 12/06/2020 --> 8.5.   Patient is taking high-dose ibuprofen 800 mg prn tid. -- Dr. Alice Reichert of GI is consult appreciated --6/16--EGD- LA Grade C reflux esophagitis with no bleeding.                        - Esophageal mucosal changes suggestive of                        eosinophilic esophagitis. Biopsied.                        - 3 cm hiatal hernia.                        - Gastritis.                        - Normal examined duodenum. Per GI cont PPI and f/u out pt in 6 weeks --transfuse as needed    GERD (gastroesophageal reflux disease): -on protonix IV as above         DVT  ppx: SCD Code Status: Full code Family Communication:    Yes, patient's wife   at bed side Disposition Plan:  Anticipate discharge back to previous environment Consults called:  Dr. Holley Raring of renal and Dr. Alice Reichert of GI are consulted. Admission status and Level of care: Progressive Cardiac:    as inpt       Status is: Inpatient   Remains inpatient appropriate because:Inpatient level of care appropriate due to severity of illness   Dispo: The patient is from: Home              Anticipated d/c is to: Home              Patient currently is not medically stable to d/c.              Difficult to place patient No          TOTAL TIME TAKING CARE OF THIS PATIENT: 35 minutes.  >50% time spent on counselling and coordination of care  Note: This dictation was prepared with Dragon dictation along with smaller phrase technology. Any transcriptional errors that result from this process are unintentional.  Fritzi Mandes M.D    [facility]   CC: Primary care physician; Margo Common, PA-C Patient ID: Larry [name], male   DOB: 1965-11-13, 55 y.o.   MRN: [id number]

## 2021-03-07 NOTE — Progress Notes (Signed)
[facility]  ROUNDING NOTE   Subjective:   Larry [name] is a 55 y.o. male with current medical problems consisting of hypertension, depression, anemia, nephrotic syndrome, GERD, PAF not on anticoagulation, and ESRD on PD. He presents to the ED with complaints of cough with coffee-ground emesis and progressive weakness.   We have been consulted to manage PD and hyperkalemia on arrival. Hhe is a current patient of Dr Holley Raring.   Patient drowsy during visit Wife at bedside States husband does miss some treatments during the week due to a late pool league. She also states he takes Ibuprofen sometimes for pain, recently Patient states yesterday that he does his PD with a 2L initial fill, and 1.2L remaining fills.    Objective:  Vital signs in last 24 hours:  Temp:  [97.5 F (36.4 C)-98.4 F (36.9 C)] 97.7 F (36.5 C) (06/16 0736) Pulse Rate:  [89-99] 93 (06/16 0939) Resp:  [16-22] 16 (06/16 0736) BP: (135-193)/(60-93) 182/85 (06/16 0939) SpO2:  [96 %-100 %] 100 % (06/16 0736) Weight:  [63.7 kg-69.5 kg] 69.5 kg (06/16 0135)  Weight change:  Filed Weights   03/06/21 1720 03/06/21 1800 03/07/21 0135  Weight: 68 kg 63.7 kg 69.5 kg    Intake/Output: I/O last 3 completed shifts: In: 1350 [IV Piggyback:1350] Out: -    Intake/Output this shift:  Total I/O In: -  Out: 350 [Urine:350]  Physical Exam: General: NAD, resting in bed  Head: Normocephalic, atraumatic. Moist oral mucosal membranes  Eyes: Anicteric  Lungs:  Clear to auscultation, normal effort  Heart: Regular rate and rhythm  Abdomen:  Soft, nontender, nondistended  Extremities:  no peripheral edema.  Neurologic: Nonfocal, moving all four extremities  Skin: No lesions  Access: PD catheter    Basic Metabolic Panel: Recent Labs  Lab 03/06/21 1329 03/06/21 1711 03/06/21 1952 03/07/21 0519  NA 136 140  --  140  K 7.3* 5.5* 6.1* 4.3  CL 97* 104  --  103  CO2 <7* 8*  --  10*  GLUCOSE 104* 156*  --   217*  BUN 149* 136*  --  208*  CREATININE 34.05* 27.25*  --  30.76*  CALCIUM 8.2* 8.1*  --  8.5*     Liver Function Tests: Recent Labs  Lab 03/06/21 1329  AST 12*  ALT 17  ALKPHOS 62  BILITOT 1.0  PROT 6.2*  ALBUMIN 3.2*   Recent Labs  Lab 03/06/21 1329  LIPASE 112*   No results for input(s): AMMONIA in the last 168 hours.  CBC: Recent Labs  Lab 03/06/21 1329 03/06/21 1657 03/06/21 2206 03/07/21 0519  WBC 15.2* 12.2* 14.1* 9.6  HGB 8.5* 7.9* 8.4* 7.6*  HCT 25.8* 23.7* 26.9* 22.8*  MCV 95.9 92.9 98.9 93.1  PLT 210 184 182 179     Cardiac Enzymes: No results for input(s): CKTOTAL, CKMB, CKMBINDEX, TROPONINI in the last 168 hours.  BNP: Invalid input(s): POCBNP  CBG: Recent Labs  Lab 03/06/21 2045 03/07/21 0737  GLUCAP 116* 227*    Microbiology: Results for orders placed or performed during the hospital encounter of 03/06/21  Resp Panel by RT-PCR (Flu A&B, Covid) Nasopharyngeal Swab     Status: None   Collection Time: 03/06/21  2:01 PM   Specimen: Nasopharyngeal Swab; Nasopharyngeal(NP) swabs in vial transport medium  Result Value Ref Range Status   SARS Coronavirus 2 by RT PCR NEGATIVE NEGATIVE Final    Comment: (NOTE) SARS-CoV-2 target nucleic acids are NOT DETECTED.  The  SARS-CoV-2 RNA is generally detectable in upper respiratory specimens during the acute phase of infection. The lowest concentration of SARS-CoV-2 viral copies this assay can detect is 138 copies/mL. A negative result does not preclude SARS-Cov-2 infection and should not be used as the sole basis for treatment or other patient management decisions. A negative result may occur with  improper specimen collection/handling, submission of specimen other than nasopharyngeal swab, presence of viral mutation(s) within the areas targeted by this assay, and inadequate number of viral copies(<138 copies/mL). A negative result must be combined with clinical observations, patient history,  and epidemiological information. The expected result is Negative.  Fact Sheet for Patients:  EntrepreneurPulse.com.au  Fact Sheet for Healthcare Providers:  IncredibleEmployment.be  This test is no t yet approved or cleared by the [place] FDA and  has been authorized for detection and/or diagnosis of SARS-CoV-2 by FDA under an Emergency Use Authorization (EUA). This EUA will remain  in effect (meaning this test can be used) for the duration of the COVID-19 declaration under Section 564(b)(1) of the Act, 21 U.S.C.section 360bbb-3(b)(1), unless the authorization is terminated  or revoked sooner.       Influenza A by PCR NEGATIVE NEGATIVE Final   Influenza B by PCR NEGATIVE NEGATIVE Final    Comment: (NOTE) The Xpert Xpress SARS-CoV-2/FLU/RSV plus assay is intended as an aid in the diagnosis of influenza from Nasopharyngeal swab specimens and should not be used as a sole basis for treatment. Nasal washings and aspirates are unacceptable for Xpert Xpress SARS-CoV-2/FLU/RSV testing.  Fact Sheet for Patients: EntrepreneurPulse.com.au  Fact Sheet for Healthcare Providers: IncredibleEmployment.be  This test is not yet approved or cleared by the [place] FDA and has been authorized for detection and/or diagnosis of SARS-CoV-2 by FDA under an Emergency Use Authorization (EUA). This EUA will remain in effect (meaning this test can be used) for the duration of the COVID-19 declaration under Section 564(b)(1) of the Act, 21 U.S.C. section 360bbb-3(b)(1), unless the authorization is terminated or revoked.  Performed at [facility], [address] [place], [place] [address]   Culture, blood (routine x 2)     Status: None (Preliminary result)   Collection Time: 03/06/21  4:11 PM   Specimen: BLOOD  Result Value Ref Range Status   Specimen Description BLOOD RIGHT ANTECUBITAL  Final   Special  Requests   Final    BOTTLES DRAWN AEROBIC AND ANAEROBIC Blood Culture results may not be optimal due to an excessive volume of blood received in culture bottles   Culture   Final    NO GROWTH < 24 HOURS Performed at [facility], [address], [place], [place] [address]    Report Status PENDING  Incomplete  Culture, blood (routine x 2)     Status: None (Preliminary result)   Collection Time: 03/06/21  4:11 PM   Specimen: BLOOD  Result Value Ref Range Status   Specimen Description BLOOD BLOOD LEFT ARM  Final   Special Requests   Final    BOTTLES DRAWN AEROBIC AND ANAEROBIC Blood Culture results may not be optimal due to an excessive volume of blood received in culture bottles   Culture   Final    NO GROWTH < 24 HOURS Performed at [facility], [address] [place], [place] [address]    Report Status PENDING  Incomplete  MRSA PCR Screening     Status: None   Collection Time: 03/06/21 11:43 PM  Result Value Ref Range Status  MRSA by PCR NEGATIVE NEGATIVE Final    Comment:        The GeneXpert MRSA Assay (FDA approved for NASAL specimens only), is one component of a comprehensive MRSA colonization surveillance program. It is not intended to diagnose MRSA infection nor to guide or monitor treatment for MRSA infections. Performed at [facility], [address] [place], [place] [address]     Coagulation Studies: Recent Labs    03/06/21 1657  LABPROT 16.4*  INR 1.3*    Urinalysis: No results for input(s): COLORURINE, LABSPEC, PHURINE, GLUCOSEU, HGBUR, BILIRUBINUR, KETONESUR, PROTEINUR, UROBILINOGEN, NITRITE, LEUKOCYTESUR in the last 72 hours.  Invalid input(s): APPERANCEUR    Imaging: DG Abdomen Acute W/Chest  Result Date: 03/06/2021 CLINICAL DATA:  Weakness EXAM: DG ABDOMEN ACUTE WITH 1 VIEW CHEST COMPARISON:  Chest x-ray 12/06/2020, abdominal radiograph 12/07/2020 FINDINGS: Single-view chest demonstrates cardiomegaly with vascular  congestion. Diffuse bilateral interstitial opacity with patchy ground-glass opacity in the left thorax. No pneumothorax. Supine and upright views of the abdomen demonstrate no free air beneath the diaphragm. Surgical clips in the right upper quadrant. Nonobstructed gas pattern. Peritoneal dialysis catheter coiled over the left mid abdomen. No radiopaque calculi. IMPRESSION: 1. Cardiomegaly with vascular congestion and diffuse bilateral interstitial opacity probably due to pulmonary edema. Patchy ground-glass opacities in the left thorax may be due to slight asymmetric edema versus superimposed pneumonia 2. Nonobstructed gas pattern Electronically Signed   By: Donavan Foil M.D.   On: 03/06/2021 15:43     Medications:    sodium chloride 250 mL (03/06/21 2305)   cefTRIAXone (ROCEPHIN)  IV     dialysis solution 2.5% low-MG/low-CA     promethazine (PHENERGAN) injection (IM or IVPB) 12.5 mg (03/07/21 0724)    amLODipine  5 mg Oral Daily   azithromycin  250 mg Oral Daily   calcium acetate  1,334 mg Oral TID with meals   gentamicin cream  1 application Topical Daily   metoprolol succinate  100 mg Oral Daily   nicotine  14 mg Transdermal Daily   nitroGLYCERIN  0.5 inch Topical Q6H   pantoprazole (PROTONIX) IV  40 mg Intravenous Q12H   sertraline  25 mg Oral Daily   sodium bicarbonate  1,300 mg Oral BID     Assessment/ Plan:  Larry [name] is a 55 y.o.  male with current medical problems consisting of hypertension, depression, anemia, nephrotic syndrome, GERD, PAF not on anticoagulation, and ESRD on PD. He presents to the ED with complaints of cough with coffee-ground emesis and progressive weakness.   Cycler: 4 fills/2L per fill/50mn drain/159m fill/1.45 dwell   End Stage Renal Disease on PD Will continue current PD schedule while inpatient BUN 208, Creatinine 30 Will place on 12 hour cycle of PD Spoke briefly about the possibility of needing hemodialysis if this treatment is  unsuccessful.  Will monitor closely  Lab Results  Component Value Date   CREATININE 30.76 (H) 03/07/2021   CREATININE 27.25 (H) 03/06/2021   CREATININE 34.05 (H) 03/06/2021    Intake/Output Summary (Last 24 hours) at 03/07/2021 1115 Last data filed at 03/07/2021 1115 Gross per 24 hour  Intake 1350 ml  Output 350 ml  Net 1000 ml    2. Hyperkalemia  On admission 7.3 Currently 4.3  3. Anemia of chronic kidney disease/  Lab Results  Component Value Date   HGB 7.6 (L) 03/07/2021  Monthly EPO outpateint Will continue to monitor  4. Secondary Hyperparathyroidism:  Lab Results  Component Value  Date   CALCIUM 8.5 (L) 03/07/2021   CAION 1.07 (L) 10/03/2020   PHOS 11.5 (H) 12/08/2020  Elevated phosphorus and decreased calcium Calcium Acetate with meals    LOS: 1 Lamija Besse 6/16/202211:15 AM

## 2021-03-07 NOTE — Progress Notes (Signed)
Patient unable to tolerate PO medications d/t extreme nausea/vomitting.  He is also NPO for possible EGD today.  Gave IV hydralazine this AM for elevated BP.  BP remains high.  MD consulted and nitro paste added to regimen.

## 2021-03-07 NOTE — Op Note (Signed)
[facility] Gastroenterology Patient Name: Larry [name] Procedure Date: 03/07/2021 2:21 PM MRN: [id number] Account #: [id number] Date of Birth: 07-23-1966 Admit Type: Inpatient Age: 55 Room: [facility] ENDO ROOM 3 Gender: Male Note Status: Finalized Procedure:             Upper GI endoscopy Indications:           Acute post hemorrhagic anemia, Coffee-ground emesis Providers:             Benay Pike. Alice Reichert MD, MD Referring MD:          Vickki Muff. Chrismon, MD (Referring MD) Medicines:             Propofol per Anesthesia Complications:         No immediate complications. Procedure:             Pre-Anesthesia Assessment:                        - The risks and benefits of the procedure and the                         sedation options and risks were discussed with the                         patient. All questions were answered and informed                         consent was obtained.                        - Patient identification and proposed procedure were                         verified prior to the procedure by the nurse. The                         procedure was verified in the procedure room.                        - ASA Grade Assessment: III - A patient with severe                         systemic disease.                        - After reviewing the risks and benefits, the patient                         was deemed in satisfactory condition to undergo the                         procedure.                        After obtaining informed consent, the endoscope was                         passed under direct vision. Throughout the procedure,                         the  patient's blood pressure, pulse, and oxygen                         saturations were monitored continuously. The Endoscope                         was introduced through the mouth, and advanced to the                         third part of duodenum. The upper GI endoscopy was                          accomplished without difficulty. The patient tolerated                         the procedure well. Findings:      LA Grade C (one or more mucosal breaks continuous between tops of 2 or       more mucosal folds, less than 75% circumference) esophagitis with no       bleeding was found in the lower third of the esophagus.      Mucosal changes including feline appearance and longitudinal markings       were found in the entire esophagus. Biopsies were obtained from the       proximal and distal esophagus with cold forceps for histology of       suspected eosinophilic esophagitis.      A 3 cm hiatal hernia was present.      Patchy mild inflammation characterized by congestion (edema) and       erosions was found in the gastric antrum.      The examined duodenum was normal.      The exam was otherwise without abnormality. Impression:            - LA Grade C reflux esophagitis with no bleeding.                        - Esophageal mucosal changes suggestive of                         eosinophilic esophagitis. Biopsied.                        - 3 cm hiatal hernia.                        - Gastritis.                        - Normal examined duodenum.                        - The examination was otherwise normal. Recommendation:        - Patient has a contact number available for                         emergencies. The signs and symptoms of potential                         delayed complications were discussed with the patient.  Return to normal activities tomorrow. Written                         discharge instructions were provided to the patient.                        - Return patient to hospital ward for ongoing care.                        - Resume previous diet.                        - Continue present medications.                        - Use Protonix (pantoprazole) 40 mg PO daily.                        - Await pathology results.                        - Return  to my office in 6 weeks.                        - GI sign off. No high risk lesions noted to cause                         major GI blood loss.                        Call back if needed. Procedure Code(s):     --- Professional ---                        [phone], Esophagogastroduodenoscopy, flexible,                         transoral; with biopsy, single or multiple Diagnosis Code(s):     --- Professional ---                        K92.0, Hematemesis                        D62, Acute posthemorrhagic anemia                        K29.70, Gastritis, unspecified, without bleeding                        K44.9, Diaphragmatic hernia without obstruction or                         gangrene                        K22.8, Other specified diseases of esophagus                        K21.00, Gastro-esophageal reflux disease with                         esophagitis, without bleeding CPT copyright 2019 American Medical Association.  All rights reserved. The codes documented in this report are preliminary and upon coder review may  be revised to meet current compliance requirements. Efrain Sella MD, MD 03/07/2021 2:44:40 PM This report has been signed electronically. Number of Addenda: 0 Note Initiated On: 03/07/2021 2:21 PM Estimated Blood Loss:  Estimated blood loss: none.      [facility]

## 2021-03-07 NOTE — Transfer of Care (Signed)
Immediate Anesthesia Transfer of Care Note  Patient: Larry [name]  Procedure(s) Performed: ESOPHAGOGASTRODUODENOSCOPY (EGD)  Patient Location: PACU  Anesthesia Type:General  Level of Consciousness: drowsy  Airway & Oxygen Therapy: Patient Spontanous Breathing  Post-op Assessment: Report given to RN and Post -op Vital signs reviewed and stable  Post vital signs: stable  Last Vitals:  Vitals Value Taken Time  BP    Temp    Pulse    Resp    SpO2      Last Pain:  Vitals:   03/07/21 1324  TempSrc: Temporal  PainSc: 6          Complications: No notable events documented.

## 2021-03-07 NOTE — Anesthesia Preprocedure Evaluation (Signed)
Anesthesia Evaluation  Patient identified by MRN, date of birth, ID band Patient awake    Reviewed: Allergy & Precautions, H&P , NPO status , Patient's Chart, lab work & pertinent test results, reviewed documented beta blocker date and time   Airway Mallampati: III       Dental  (+) Poor Dentition   Pulmonary pneumonia, Current Smoker and Patient abstained from smoking., former smoker,    Pulmonary exam normal breath sounds clear to auscultation       Cardiovascular hypertension, Pt. on medications and Pt. on home beta blockers negative cardio ROS Normal cardiovascular exam Rhythm:Regular Rate:Normal     Neuro/Psych negative neurological ROS  negative psych ROS   GI/Hepatic negative GI ROS, Neg liver ROS,   Endo/Other  negative endocrine ROS  Renal/GU ESRF and DialysisRenal disease  negative genitourinary   Musculoskeletal negative musculoskeletal ROS (+)   Abdominal   Peds negative pediatric ROS (+)  Hematology negative hematology ROS (+) anemia ,   Anesthesia Other Findings Anemia Chronic kidney disease Hypertension Nephrotic syndrome  ESRD-peritoneal dialysis, nephrotic syndrome, HTN, GERD, depression, anemia, PAF not on anticoagulants Left ventricular ejection fraction, by estimation, is 55 to 60%   Reproductive/Obstetrics negative OB ROS                             Anesthesia Physical  Anesthesia Plan  ASA: 4  Anesthesia Plan: General   Post-op Pain Management:    Induction: Intravenous  PONV Risk Score and Plan: 2 and TIVA and Propofol infusion  Airway Management Planned: Natural Airway and Nasal Cannula  Additional Equipment:   Intra-op Plan:   Post-operative Plan: Extubation in OR  Informed Consent: I have reviewed the patients History and Physical, chart, labs and discussed the procedure including the risks, benefits and alternatives for the proposed  anesthesia with the patient or authorized representative who has indicated his/her understanding and acceptance.       Plan Discussed with: CRNA, Anesthesiologist and Surgeon  Anesthesia Plan Comments:         Anesthesia Quick Evaluation

## 2021-03-08 ENCOUNTER — Encounter: Payer: Self-pay | Admitting: Internal Medicine

## 2021-03-08 DIAGNOSIS — N19 Unspecified kidney failure: Secondary | ICD-10-CM | POA: Diagnosis not present

## 2021-03-08 LAB — RENAL FUNCTION PANEL
Albumin: 2.7 g/dL — ABNORMAL LOW (ref 3.5–5.0)
Anion gap: 21 — ABNORMAL HIGH (ref 5–15)
BUN: 154 mg/dL — ABNORMAL HIGH (ref 6–20)
CO2: 16 mmol/L — ABNORMAL LOW (ref 22–32)
Calcium: 7.5 mg/dL — ABNORMAL LOW (ref 8.9–10.3)
Chloride: 102 mmol/L (ref 98–111)
Creatinine, Ser: 24.73 mg/dL — ABNORMAL HIGH (ref 0.61–1.24)
GFR, Estimated: 2 mL/min — ABNORMAL LOW (ref >60)
Glucose, Bld: 132 mg/dL — ABNORMAL HIGH (ref 70–99)
Phosphorus: 11.7 mg/dL — ABNORMAL HIGH (ref 2.5–4.6)
Potassium: 3.2 mmol/L — ABNORMAL LOW (ref 3.5–5.1)
Sodium: 139 mmol/L (ref 135–145)

## 2021-03-08 LAB — CBC
HCT: 21.1 % — ABNORMAL LOW (ref 39.0–52.0)
Hemoglobin: 7.4 g/dL — ABNORMAL LOW (ref 13.0–17.0)
MCH: 31.4 pg (ref 26.0–34.0)
MCHC: 35.1 g/dL (ref 30.0–36.0)
MCV: 89.4 fL (ref 80.0–100.0)
Platelets: 153 10*3/uL (ref 150–400)
RBC: 2.36 MIL/uL — ABNORMAL LOW (ref 4.22–5.81)
RDW: 16.1 % — ABNORMAL HIGH (ref 11.5–15.5)
WBC: 7.8 10*3/uL (ref 4.0–10.5)
nRBC: 0 % (ref 0.0–0.2)

## 2021-03-08 LAB — GLUCOSE, CAPILLARY
Glucose-Capillary: 115 mg/dL — ABNORMAL HIGH (ref 70–99)
Glucose-Capillary: 108 mg/dL — ABNORMAL HIGH (ref 70–99)
Glucose-Capillary: 116 mg/dL — ABNORMAL HIGH (ref 70–99)

## 2021-03-08 MED ORDER — CEFUROXIME AXETIL 500 MG PO TABS
250.0000 mg | ORAL_TABLET | Freq: Every day | ORAL | Status: DC
  Administered 2021-03-09 – 2021-03-10 (×3): 250 mg via ORAL
  Filled 2021-03-08: qty 1
  Filled 2021-03-08: qty 0.5
  Filled 2021-03-08 (×2): qty 1
  Filled 2021-03-08: qty 0.5
  Filled 2021-03-08: qty 1
  Filled 2021-03-08: qty 0.5

## 2021-03-08 MED ORDER — PANTOPRAZOLE SODIUM 40 MG PO TBEC
40.0000 mg | DELAYED_RELEASE_TABLET | Freq: Two times a day (BID) | ORAL | Status: DC
  Administered 2021-03-08 – 2021-03-12 (×8): 40 mg via ORAL
  Filled 2021-03-08 (×8): qty 1

## 2021-03-08 MED ORDER — HYDRALAZINE HCL 20 MG/ML IJ SOLN
10.0000 mg | INTRAMUSCULAR | Status: DC | PRN
  Administered 2021-03-08 – 2021-03-11 (×5): 10 mg via INTRAVENOUS
  Filled 2021-03-08 (×5): qty 1

## 2021-03-08 MED ORDER — HYDRALAZINE HCL 25 MG PO TABS
25.0000 mg | ORAL_TABLET | Freq: Three times a day (TID) | ORAL | Status: DC
  Administered 2021-03-08 – 2021-03-11 (×9): 25 mg via ORAL
  Filled 2021-03-08 (×9): qty 1

## 2021-03-08 MED ORDER — LABETALOL HCL 5 MG/ML IV SOLN
10.0000 mg | Freq: Once | INTRAVENOUS | Status: AC
  Administered 2021-03-08: 10 mg via INTRAVENOUS
  Filled 2021-03-08: qty 4

## 2021-03-08 NOTE — Progress Notes (Signed)
[facility] [place] NAME: Larry [name]    MR#:  [id number]  DATE OF BIRTH:  02/24/1966  SUBJECTIVE:  patient came in with weakness cough overall not feeling well and significant hiccups. He had coffee ground emesis at home.  wife at bedside who reports patient has not been compliant with his PD treatments as he is supposed to be patient is is having dry heaves. Tolerating clear liquid REVIEW OF SYSTEMS:   Review of Systems  Constitutional:  Positive for malaise/fatigue. Negative for chills, fever and weight loss.  HENT:  Negative for ear discharge, ear pain and nosebleeds.   Eyes:  Negative for blurred vision, pain and discharge.  Respiratory:  Positive for cough and shortness of breath. Negative for sputum production, wheezing and stridor.   Cardiovascular:  Negative for chest pain, palpitations, orthopnea and PND.  Gastrointestinal:  Negative for abdominal pain, diarrhea, nausea and vomiting.  Genitourinary:  Negative for frequency and urgency.  Musculoskeletal:  Negative for back pain and joint pain.  Neurological:  Positive for weakness. Negative for sensory change, speech change and focal weakness.  Psychiatric/Behavioral:  Negative for depression and hallucinations. The patient is not nervous/anxious.   Tolerating Diet:CLD Tolerating PT:   DRUG ALLERGIES:   Allergies  Allergen Reactions   Mushroom Extract Complex     Headache, migraines   Amoxicillin Nausea And Vomiting    Weakness  Has patient had a PCN reaction causing immediate rash, facial/tongue/throat swelling, SOB or lightheadedness with hypotension: No Has patient had a PCN reaction causing severe rash involving mucus membranes or skin necrosis: No Has patient had a PCN reaction that required hospitalization: No Has patient had a PCN reaction occurring within the last 10 years: No If all of the above answers are "NO", then may proceed with Cephalosporin use.      VITALS:  Blood pressure (!) 160/86, pulse 85, temperature 98 F (36.7 C), resp. rate 18, height '5\' 4"'$  (1.626 m), weight 69.5 kg, SpO2 100 %.  PHYSICAL EXAMINATION:   Physical Exam  GENERAL:  55 y.o.-year-old patient lying in the bed with no acute distress. Chronically ill HEENT: Head atraumatic, normocephalic. Oropharynx and nasopharynx clear.  LUNGS: Normal breath sounds bilaterally, no wheezing, rales, rhonchi. No use of accessory muscles of respiration.  CARDIOVASCULAR: S1, S2 normal. No murmurs, rubs, or gallops.  ABDOMEN: Soft, nontender, nondistended. Bowel sounds present. No organomegaly or mass. PD cath+ EXTREMITIES: No cyanosis, clubbing or edema b/l.    NEUROLOGIC: nonfocal, weak PSYCHIATRIC:  patient is alert and oriented x 3.  SKIN: No obvious rash, lesion, or ulcer.   LABORATORY PANEL:  CBC Recent Labs  Lab 03/08/21 0531  WBC 7.8  HGB 7.4*  HCT 21.1*  PLT 153     Chemistries  Recent Labs  Lab 03/06/21 1329 03/06/21 1711 03/07/21 0519  NA 136   < > 140  K 7.3*   < > 4.3  CL 97*   < > 103  CO2 <7*   < > 10*  GLUCOSE 104*   < > 217*  BUN 149*   < > 208*  CREATININE 34.05*   < > 30.76*  CALCIUM 8.2*   < > 8.5*  AST 12*  --   --   ALT 17  --   --   ALKPHOS 62  --   --   BILITOT 1.0  --   --    < > = values in this interval  not displayed.    Cardiac Enzymes No results for input(s): TROPONINI in the last 168 hours. RADIOLOGY:  DG Abdomen Acute W/Chest  Result Date: 03/06/2021 CLINICAL DATA:  Weakness EXAM: DG ABDOMEN ACUTE WITH 1 VIEW CHEST COMPARISON:  Chest x-ray 12/06/2020, abdominal radiograph 12/07/2020 FINDINGS: Single-view chest demonstrates cardiomegaly with vascular congestion. Diffuse bilateral interstitial opacity with patchy ground-glass opacity in the left thorax. No pneumothorax. Supine and upright views of the abdomen demonstrate no free air beneath the diaphragm. Surgical clips in the right upper quadrant. Nonobstructed gas pattern.  Peritoneal dialysis catheter coiled over the left mid abdomen. No radiopaque calculi. IMPRESSION: 1. Cardiomegaly with vascular congestion and diffuse bilateral interstitial opacity probably due to pulmonary edema. Patchy ground-glass opacities in the left thorax may be due to slight asymmetric edema versus superimposed pneumonia 2. Nonobstructed gas pattern Electronically Signed   By: Donavan Foil M.D.   On: 03/06/2021 15:43   ASSESSMENT AND PLAN:   Larry [name] is a 55 y.o. male with medical history significant of ESRD-peritoneal dialysis, nephrotic syndrome, HTN, GERD, depression, anemia, PAF not on anticoagulants, who presents with productive cough, coffee-ground emesis and weakness.  Patient states that he has been sick for several days. He has cough with thick yellow-colored sputum production.  Patient is taking Z-Pak for URI currently.    Uremia and hyperkalemia Non compliance to PD rx at home -- K 7.3. Has T-wave peaking on EKG. In the ER--Patient was treated for hyperkalemia with D50, NovoLog 10 unit, 50 mEq of sodium bicarbonate, patiromer 25.2 g --Consulted Dr. Holley Raring of renal-- recommends to continue PD here appropriately for next few days in order to decrease BUN and creatinine --K down to 4.3  Sepsis due to HCAP (healthcare-associated pneumonia): Patient meets criteria for sepsis with leukocytosis with WBC 15.2, tachycardia with heart rate of 99, and tachypnea with RR 22.  Patient has productive cough.  -- X-ray showed possible infiltration, indicating possible pneumonia/HCAP.  Lactic acid normal 0.9.  Procalcitonin is elevated 1.04. - IV  Rocephin and azithromycin --change to oral abxs (total 7 days) - Mucinex for cough - Bronchodilators - blood culture negative --MRSA PCR neg   ESRD on peritoneal dialysis ([place]) -renal is consulted for  Peritoneal dialysis   HTN (hypertension) -IV hydralazine as needed -Amlodipine, metoprolol   Nicotine dependence -Nicotine patch    Paroxysmal atrial fibrillation ([place]): Patient's not taking anticoagulants. -Continue home metoprolol   Coffee ground emesis and anemia in ESRD:  --Hemoglobin 9.1 on 12/06/2020 --> 8.5.  Patient is taking high-dose ibuprofen 800 mg prn tid. -- Dr. Alice Reichert of GI is consult appreciated --6/16--EGD- LA Grade C reflux esophagitis with no bleeding.                        - Esophageal mucosal changes suggestive of                        eosinophilic esophagitis. Biopsied.                        - 3 cm hiatal hernia.                        - Gastritis.                        - Normal examined duodenum. Per GI cont PPI and f/u out pt  in 6 weeks --transfuse as needed    GERD (gastroesophageal reflux disease): -on protonix IV as above         DVT ppx: SCD Code Status: Full code Family Communication:    Yes, patient's wife   at bed side Disposition Plan:  Anticipate discharge back to previous environment Consults called:  Dr. Holley Raring of renal and Dr. Alice Reichert of GI are consulted. Admission status and Level of care: Progressive Cardiac:    as inpt       Status is: Inpatient   Remains inpatient appropriate because:Inpatient level of care appropriate due to severity of illness   Dispo: The patient is from: Home              Anticipated d/c is to: Home              Patient currently is not medically stable to d/c.              Difficult to place patient No          TOTAL TIME TAKING CARE OF THIS PATIENT: 25 minutes.  >50% time spent on counselling and coordination of care  Note: This dictation was prepared with Dragon dictation along with smaller phrase technology. Any transcriptional errors that result from this process are unintentional.  Fritzi Mandes M.D    [facility]   CC: Primary care physician; Margo Common, PA-C Patient ID: [name], male   DOB: 1966-04-07, 55 y.o.   MRN: [id number]

## 2021-03-08 NOTE — Progress Notes (Signed)
[facility]  ROUNDING NOTE   Subjective:   Larry [name] is a 55 y.o. male with current medical problems consisting of hypertension, depression, anemia, nephrotic syndrome, GERD, PAF not on anticoagulation, and ESRD on PD. He presents to the ED with complaints of cough with coffee-ground emesis and progressive weakness.   Patient still quite lethargic. Did have extended peritoneal dialysis overnight. Awaiting repeat renal function testing today.   Objective:  Vital signs in last 24 hours:  Temp:  [98 F (36.7 C)-99.2 F (37.3 C)] 98 F (36.7 C) (06/17 1120) Pulse Rate:  [85-115] 85 (06/17 1120) Resp:  [17-26] 18 (06/17 1120) BP: (137-178)/(67-90) 160/86 (06/17 1120) SpO2:  [98 %-100 %] 100 % (06/17 1120)  Weight change: 1.461 kg Filed Weights   03/06/21 1800 03/07/21 0135 03/07/21 1324  Weight: 63.7 kg 69.5 kg 69.5 kg    Intake/Output: I/O last 3 completed shifts: In: 1084 [P.O.:840; I.V.:194; IV Piggyback:50] Out: 650 [Urine:650]   Intake/Output this shift:  Total I/O In: 680 [P.O.:680] Out: 200 [Urine:200]  Physical Exam: General: NAD, resting in bed  Head: Normocephalic, atraumatic. Moist oral mucosal membranes  Eyes: Anicteric  Lungs:  Clear to auscultation, normal effort  Heart: Regular rate and rhythm  Abdomen:  Soft, nontender, nondistended  Extremities: no peripheral edema.  Neurologic: Nonfocal, moving all four extremities  Skin: No lesions  Access: PD catheter    Basic Metabolic Panel: Recent Labs  Lab 03/06/21 1329 03/06/21 1711 03/06/21 1952 03/07/21 0519  NA 136 140  --  140  K 7.3* 5.5* 6.1* 4.3  CL 97* 104  --  103  CO2 <7* 8*  --  10*  GLUCOSE 104* 156*  --  217*  BUN 149* 136*  --  208*  CREATININE 34.05* 27.25*  --  30.76*  CALCIUM 8.2* 8.1*  --  8.5*     Liver Function Tests: Recent Labs  Lab 03/06/21 1329  AST 12*  ALT 17  ALKPHOS 62  BILITOT 1.0  PROT 6.2*  ALBUMIN 3.2*    Recent Labs  Lab  03/06/21 1329  LIPASE 112*    No results for input(s): AMMONIA in the last 168 hours.  CBC: Recent Labs  Lab 03/06/21 1329 03/06/21 1657 03/06/21 2206 03/07/21 0519 03/08/21 0531  WBC 15.2* 12.2* 14.1* 9.6 7.8  HGB 8.5* 7.9* 8.4* 7.6* 7.4*  HCT 25.8* 23.7* 26.9* 22.8* 21.1*  MCV 95.9 92.9 98.9 93.1 89.4  PLT 210 184 182 179 153     Cardiac Enzymes: No results for input(s): CKTOTAL, CKMB, CKMBINDEX, TROPONINI in the last 168 hours.  BNP: Invalid input(s): POCBNP  CBG: Recent Labs  Lab 03/06/21 2045 03/07/21 0737 03/07/21 1117 03/08/21 0809 03/08/21 1129  GLUCAP 116* 227* 126* 115* 108*     Microbiology: Results for orders placed or performed during the hospital encounter of 03/06/21  Resp Panel by RT-PCR (Flu A&B, Covid) Nasopharyngeal Swab     Status: None   Collection Time: 03/06/21  2:01 PM   Specimen: Nasopharyngeal Swab; Nasopharyngeal(NP) swabs in vial transport medium  Result Value Ref Range Status   SARS Coronavirus 2 by RT PCR NEGATIVE NEGATIVE Final    Comment: (NOTE) SARS-CoV-2 target nucleic acids are NOT DETECTED.  The SARS-CoV-2 RNA is generally detectable in upper respiratory specimens during the acute phase of infection. The lowest concentration of SARS-CoV-2 viral copies this assay can detect is 138 copies/mL. A negative result does not preclude SARS-Cov-2 infection and should not be used as  the sole basis for treatment or other patient management decisions. A negative result may occur with  improper specimen collection/handling, submission of specimen other than nasopharyngeal swab, presence of viral mutation(s) within the areas targeted by this assay, and inadequate number of viral copies(<138 copies/mL). A negative result must be combined with clinical observations, patient history, and epidemiological information. The expected result is Negative.  Fact Sheet for Patients:  EntrepreneurPulse.com.au  Fact Sheet for  Healthcare Providers:  IncredibleEmployment.be  This test is no t yet approved or cleared by the [place] FDA and  has been authorized for detection and/or diagnosis of SARS-CoV-2 by FDA under an Emergency Use Authorization (EUA). This EUA will remain  in effect (meaning this test can be used) for the duration of the COVID-19 declaration under Section 564(b)(1) of the Act, 21 U.S.C.section 360bbb-3(b)(1), unless the authorization is terminated  or revoked sooner.       Influenza A by PCR NEGATIVE NEGATIVE Final   Influenza B by PCR NEGATIVE NEGATIVE Final    Comment: (NOTE) The Xpert Xpress SARS-CoV-2/FLU/RSV plus assay is intended as an aid in the diagnosis of influenza from Nasopharyngeal swab specimens and should not be used as a sole basis for treatment. Nasal washings and aspirates are unacceptable for Xpert Xpress SARS-CoV-2/FLU/RSV testing.  Fact Sheet for Patients: EntrepreneurPulse.com.au  Fact Sheet for Healthcare Providers: IncredibleEmployment.be  This test is not yet approved or cleared by the [place] FDA and has been authorized for detection and/or diagnosis of SARS-CoV-2 by FDA under an Emergency Use Authorization (EUA). This EUA will remain in effect (meaning this test can be used) for the duration of the COVID-19 declaration under Section 564(b)(1) of the Act, 21 U.S.C. section 360bbb-3(b)(1), unless the authorization is terminated or revoked.  Performed at [facility], [address] [place], [place] [address]   Culture, blood (routine x 2)     Status: None (Preliminary result)   Collection Time: 03/06/21  4:11 PM   Specimen: BLOOD  Result Value Ref Range Status   Specimen Description BLOOD RIGHT ANTECUBITAL  Final   Special Requests   Final    BOTTLES DRAWN AEROBIC AND ANAEROBIC Blood Culture results may not be optimal due to an excessive volume of blood received in culture  bottles   Culture   Final    NO GROWTH < 24 HOURS Performed at [facility], [address] [place], [place] [address]    Report Status PENDING  Incomplete  Culture, blood (routine x 2)     Status: None (Preliminary result)   Collection Time: 03/06/21  4:11 PM   Specimen: BLOOD  Result Value Ref Range Status   Specimen Description BLOOD BLOOD LEFT ARM  Final   Special Requests   Final    BOTTLES DRAWN AEROBIC AND ANAEROBIC Blood Culture results may not be optimal due to an excessive volume of blood received in culture bottles   Culture   Final    NO GROWTH < 24 HOURS Performed at [facility], [address], [place], [place] [address]    Report Status PENDING  Incomplete  MRSA PCR Screening     Status: None   Collection Time: 03/06/21 11:43 PM  Result Value Ref Range Status   MRSA by PCR NEGATIVE NEGATIVE Final    Comment:        The GeneXpert MRSA Assay (FDA approved for NASAL specimens only), is one component of a comprehensive MRSA colonization surveillance program. It is not intended  to diagnose MRSA infection nor to guide or monitor treatment for MRSA infections. Performed at [facility], [address] [place], [place] [address]     Coagulation Studies: Recent Labs    03/06/21 1657  LABPROT 16.4*  INR 1.3*     Urinalysis: No results for input(s): COLORURINE, LABSPEC, PHURINE, GLUCOSEU, HGBUR, BILIRUBINUR, KETONESUR, PROTEINUR, UROBILINOGEN, NITRITE, LEUKOCYTESUR in the last 72 hours.  Invalid input(s): APPERANCEUR    Imaging: DG Abdomen Acute W/Chest  Result Date: 03/06/2021 CLINICAL DATA:  Weakness EXAM: DG ABDOMEN ACUTE WITH 1 VIEW CHEST COMPARISON:  Chest x-ray 12/06/2020, abdominal radiograph 12/07/2020 FINDINGS: Single-view chest demonstrates cardiomegaly with vascular congestion. Diffuse bilateral interstitial opacity with patchy ground-glass opacity in the left thorax. No pneumothorax. Supine and upright views of the  abdomen demonstrate no free air beneath the diaphragm. Surgical clips in the right upper quadrant. Nonobstructed gas pattern. Peritoneal dialysis catheter coiled over the left mid abdomen. No radiopaque calculi. IMPRESSION: 1. Cardiomegaly with vascular congestion and diffuse bilateral interstitial opacity probably due to pulmonary edema. Patchy ground-glass opacities in the left thorax may be due to slight asymmetric edema versus superimposed pneumonia 2. Nonobstructed gas pattern Electronically Signed   By: Donavan Foil M.D.   On: 03/06/2021 15:43     Medications:    sodium chloride 10 mL/hr at 03/07/21 1429   cefTRIAXone (ROCEPHIN)  IV 2 g (03/07/21 2206)   dialysis solution 2.5% low-MG/low-CA     dialysis solution 2.5% low-MG/low-CA     promethazine (PHENERGAN) injection (IM or IVPB) 12.5 mg (03/08/21 0916)    amLODipine  5 mg Oral Daily   azithromycin  250 mg Oral Daily   calcium acetate  1,334 mg Oral TID with meals   gentamicin cream  1 application Topical Daily   gentamicin cream  1 application Topical Daily   hydrALAZINE  25 mg Oral Q8H   metoprolol succinate  100 mg Oral Daily   nicotine  14 mg Transdermal Daily   nitroGLYCERIN  0.5 inch Topical Q6H   pantoprazole  40 mg Oral BID   sertraline  25 mg Oral Daily   sodium bicarbonate  1,300 mg Oral BID     Assessment/ Plan:  Larry [name] is a 55 y.o.  male with current medical problems consisting of hypertension, depression, anemia, nephrotic syndrome, GERD, PAF not on anticoagulation, and ESRD on PD. He presents to the ED with complaints of cough with coffee-ground emesis and progressive weakness.   Cycler: 4 fills/2L per fill/39mn drain/131m fill/1.45 dwell   End Stage Renal Disease on PD Patient with peritoneal dialysis prescription nonadherence.  Often cutting treatments short as well as skipping some treatments as he was recently playing in a pool league.  This is reflected by very high BUN of 208 and creatinine  of 30.  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  Quite lethargic at the moment.  We will continue peritoneal dialysis x12 hours as ordered to help bring down his azotemia.  Patient's wife updated.   Lab Results  Component Value Date   CREATININE 30.76 (H) 03/07/2021   CREATININE 27.25 (H) 03/06/2021   CREATININE 34.05 (H) 03/06/2021    Intake/Output Summary (Last 24 hours) at 03/08/2021 1345 Last data filed at 03/08/2021 1117 Gross per 24 hour  Intake 1764 ml  Output 500 ml  Net 1264 ml     2. Hyperkalemia  Repeat serum potassium today.  Potassium was 7.3 upon admission.  3. Anemia of chronic  kidney disease/  Lab Results  Component Value Date   HGB 7.4 (L) 03/08/2021  Patient was receiving Epogen as an outpatient.  Consider blood transfusion for hemoglobin of 7 or less.  4. Secondary Hyperparathyroidism:  Lab Results  Component Value Date   CALCIUM 8.5 (L) 03/07/2021   CAION 1.07 (L) 10/03/2020   PHOS 11.5 (H) 12/08/2020  Continue to periodically monitor serum phosphorus.  Otherwise continue calcium acetate.   LOS: 2 Devario Bucklew 6/17/20221:45 PM

## 2021-03-08 NOTE — Plan of Care (Signed)
  Problem: Education: Goal: Knowledge of General Education information will improve Description: Including pain rating scale, medication(s)/side effects and non-pharmacologic comfort measures 03/08/2021 1126 by Cristela Blue, RN Outcome: Progressing 03/08/2021 1126 by Cristela Blue, RN Outcome: Progressing   Problem: Health Behavior/Discharge Planning: Goal: Ability to manage health-related needs will improve 03/08/2021 1126 by Cristela Blue, RN Outcome: Progressing 03/08/2021 1126 by Cristela Blue, RN Outcome: Progressing   Problem: Clinical Measurements: Goal: Ability to maintain clinical measurements within normal limits will improve 03/08/2021 1126 by Cristela Blue, RN Outcome: Progressing 03/08/2021 1126 by Cristela Blue, RN Outcome: Progressing Goal: Will remain free from infection 03/08/2021 1126 by Cristela Blue, RN Outcome: Progressing 03/08/2021 1126 by Cristela Blue, RN Outcome: Progressing Goal: Diagnostic test results will improve 03/08/2021 1126 by Cristela Blue, RN Outcome: Progressing 03/08/2021 1126 by Cristela Blue, RN Outcome: Progressing Goal: Respiratory complications will improve 03/08/2021 1126 by Cristela Blue, RN Outcome: Progressing 03/08/2021 1126 by Cristela Blue, RN Outcome: Progressing Goal: Cardiovascular complication will be avoided 03/08/2021 1126 by Cristela Blue, RN Outcome: Progressing 03/08/2021 1126 by Cristela Blue, RN Outcome: Progressing   Problem: Activity: Goal: Risk for activity intolerance will decrease 03/08/2021 1126 by Cristela Blue, RN Outcome: Progressing 03/08/2021 1126 by Cristela Blue, RN Outcome: Progressing   Problem: Nutrition: Goal: Adequate nutrition will be maintained 03/08/2021 1126 by Cristela Blue, RN Outcome: Progressing 03/08/2021 1126 by Cristela Blue, RN Outcome: Progressing   Problem: Coping: Goal: Level of anxiety will decrease 03/08/2021 1126 by Cristela Blue, RN Outcome:  Progressing 03/08/2021 1126 by Cristela Blue, RN Outcome: Progressing   Problem: Elimination: Goal: Will not experience complications related to bowel motility 03/08/2021 1126 by Cristela Blue, RN Outcome: Progressing 03/08/2021 1126 by Cristela Blue, RN Outcome: Progressing Goal: Will not experience complications related to urinary retention 03/08/2021 1126 by Cristela Blue, RN Outcome: Progressing 03/08/2021 1126 by Cristela Blue, RN Outcome: Progressing   Problem: Pain Managment: Goal: General experience of comfort will improve 03/08/2021 1126 by Cristela Blue, RN Outcome: Progressing 03/08/2021 1126 by Cristela Blue, RN Outcome: Progressing   Problem: Safety: Goal: Ability to remain free from injury will improve 03/08/2021 1126 by Cristela Blue, RN Outcome: Progressing 03/08/2021 1126 by Cristela Blue, RN Outcome: Progressing   Problem: Skin Integrity: Goal: Risk for impaired skin integrity will decrease 03/08/2021 1126 by Cristela Blue, RN Outcome: Progressing 03/08/2021 1126 by Cristela Blue, RN Outcome: Progressing

## 2021-03-09 DIAGNOSIS — N19 Unspecified kidney failure: Secondary | ICD-10-CM | POA: Diagnosis not present

## 2021-03-09 LAB — BODY FLUID CELL COUNT WITH DIFFERENTIAL
Lymphs, Fluid: 34 %
Monocyte-Macrophage-Serous Fluid: 60 %
Neutrophil Count, Fluid: 6 %
Total Nucleated Cell Count, Fluid: 31 cu mm

## 2021-03-09 LAB — RENAL FUNCTION PANEL
Albumin: 2.7 g/dL — ABNORMAL LOW (ref 3.5–5.0)
Anion gap: 18 — ABNORMAL HIGH (ref 5–15)
BUN: 132 mg/dL — ABNORMAL HIGH (ref 6–20)
CO2: 19 mmol/L — ABNORMAL LOW (ref 22–32)
Calcium: 7 mg/dL — ABNORMAL LOW (ref 8.9–10.3)
Chloride: 99 mmol/L (ref 98–111)
Creatinine, Ser: 21.05 mg/dL — ABNORMAL HIGH (ref 0.61–1.24)
GFR, Estimated: 2 mL/min — ABNORMAL LOW (ref >60)
Glucose, Bld: 106 mg/dL — ABNORMAL HIGH (ref 70–99)
Phosphorus: 10.3 mg/dL — ABNORMAL HIGH (ref 2.5–4.6)
Potassium: 2.7 mmol/L — CL (ref 3.5–5.1)
Sodium: 136 mmol/L (ref 135–145)

## 2021-03-09 LAB — CBC
HCT: 21.8 % — ABNORMAL LOW (ref 39.0–52.0)
Hemoglobin: 7.5 g/dL — ABNORMAL LOW (ref 13.0–17.0)
MCH: 30.9 pg (ref 26.0–34.0)
MCHC: 34.4 g/dL (ref 30.0–36.0)
MCV: 89.7 fL (ref 80.0–100.0)
Platelets: 129 10*3/uL — ABNORMAL LOW (ref 150–400)
RBC: 2.43 MIL/uL — ABNORMAL LOW (ref 4.22–5.81)
RDW: 16.3 % — ABNORMAL HIGH (ref 11.5–15.5)
WBC: 9.9 10*3/uL (ref 4.0–10.5)
nRBC: 0 % (ref 0.0–0.2)

## 2021-03-09 LAB — GLUCOSE, CAPILLARY
Glucose-Capillary: 97 mg/dL (ref 70–99)
Glucose-Capillary: 115 mg/dL — ABNORMAL HIGH (ref 70–99)
Glucose-Capillary: 105 mg/dL — ABNORMAL HIGH (ref 70–99)
Glucose-Capillary: 158 mg/dL — ABNORMAL HIGH (ref 70–99)

## 2021-03-09 MED ORDER — POTASSIUM CHLORIDE CRYS ER 20 MEQ PO TBCR
40.0000 meq | EXTENDED_RELEASE_TABLET | Freq: Once | ORAL | Status: AC
  Administered 2021-03-09: 40 meq via ORAL
  Filled 2021-03-09: qty 2

## 2021-03-09 MED ORDER — SACCHAROMYCES BOULARDII 250 MG PO CAPS
250.0000 mg | ORAL_CAPSULE | Freq: Two times a day (BID) | ORAL | Status: DC
  Administered 2021-03-09 – 2021-03-12 (×7): 250 mg via ORAL
  Filled 2021-03-09 (×8): qty 1

## 2021-03-09 MED ORDER — LOPERAMIDE HCL 2 MG PO CAPS: 2.0000 mg | ORAL_CAPSULE | Freq: Once | ORAL | Status: DC

## 2021-03-09 MED ORDER — FERROUS SULFATE 325 (65 FE) MG PO TABS
325.0000 mg | ORAL_TABLET | Freq: Three times a day (TID) | ORAL | Status: DC
  Administered 2021-03-09 – 2021-03-12 (×7): 325 mg via ORAL
  Filled 2021-03-09 (×9): qty 1

## 2021-03-09 NOTE — Progress Notes (Signed)
[facility]  ROUNDING NOTE   Subjective:   Peritoneal dialysis last night. Cloudy effluent. Negative ultrafiltration. Patient with diarrha, nausea, vomiting and poor PO intake.   Objective:  Vital signs in last 24 hours:  Temp:  [97.9 F (36.6 C)-99.1 F (37.3 C)] 97.9 F (36.6 C) (06/18 0802) Pulse Rate:  [85-94] 94 (06/18 0802) Resp:  [17-19] 17 (06/18 0802) BP: (145-180)/(69-82) 145/69 (06/18 0802) SpO2:  [97 %-100 %] 100 % (06/18 0802) Weight:  [65.5 kg] 65.5 kg (06/18 0517)  Weight change: -4.001 kg Filed Weights   03/07/21 0135 03/07/21 1324 03/09/21 0517  Weight: 69.5 kg 69.5 kg 65.5 kg    Intake/Output: I/O last 3 completed shifts: In: [id number] [P.O.:1260; IV Piggyback:200] Out: 500 [Urine:500]   Intake/Output this shift:  No intake/output data recorded.  Physical Exam: General: NAD, laying in bed  Head: Normocephalic, atraumatic. Moist oral mucosal membranes  Eyes: Anicteric  Lungs:  Clear to auscultation, normal effort  Heart: Regular rate and rhythm  Abdomen:  Soft, nontender, nondistended  Extremities: no peripheral edema.  Neurologic: Nonfocal, moving all four extremities  Skin: No lesions  Access: PD catheter    Basic Metabolic Panel: Recent Labs  Lab 03/06/21 1329 03/06/21 1711 03/06/21 1952 03/07/21 0519 03/08/21 1420 03/09/21 1019  NA 136 140  --  140 139 136  K 7.3* 5.5* 6.1* 4.3 3.2* 2.7*  CL 97* 104  --  103 102 99  CO2 <7* 8*  --  10* 16* 19*  GLUCOSE 104* 156*  --  217* 132* 106*  BUN 149* 136*  --  208* 154* 132*  CREATININE 34.05* 27.25*  --  30.76* 24.73* 21.05*  CALCIUM 8.2* 8.1*  --  8.5* 7.5* 7.0*  PHOS  --   --   --   --  11.7* 10.3*     Liver Function Tests: Recent Labs  Lab 03/06/21 1329 03/08/21 1420 03/09/21 1019  AST 12*  --   --   ALT 17  --   --   ALKPHOS 62  --   --   BILITOT 1.0  --   --   PROT 6.2*  --   --   ALBUMIN 3.2* 2.7* 2.7*    Recent Labs  Lab 03/06/21 1329  LIPASE 112*     No results for input(s): AMMONIA in the last 168 hours.  CBC: Recent Labs  Lab 03/06/21 1657 03/06/21 2206 03/07/21 0519 03/08/21 0531 03/09/21 1019  WBC 12.2* 14.1* 9.6 7.8 9.9  HGB 7.9* 8.4* 7.6* 7.4* 7.5*  HCT 23.7* 26.9* 22.8* 21.1* 21.8*  MCV 92.9 98.9 93.1 89.4 89.7  PLT 184 182 179 153 129*     Cardiac Enzymes: No results for input(s): CKTOTAL, CKMB, CKMBINDEX, TROPONINI in the last 168 hours.  BNP: Invalid input(s): POCBNP  CBG: Recent Labs  Lab 03/07/21 1117 03/08/21 0809 03/08/21 1129 03/08/21 1619 03/09/21 0802  GLUCAP 126* 115* 108* 116* 97     Microbiology: Results for orders placed or performed during the hospital encounter of 03/06/21  Resp Panel by RT-PCR (Flu A&B, Covid) Nasopharyngeal Swab     Status: None   Collection Time: 03/06/21  2:01 PM   Specimen: Nasopharyngeal Swab; Nasopharyngeal(NP) swabs in vial transport medium  Result Value Ref Range Status   SARS Coronavirus 2 by RT PCR NEGATIVE NEGATIVE Final    Comment: (NOTE) SARS-CoV-2 target nucleic acids are NOT DETECTED.  The SARS-CoV-2 RNA is generally detectable in upper respiratory specimens during the acute  phase of infection. The lowest concentration of SARS-CoV-2 viral copies this assay can detect is 138 copies/mL. A negative result does not preclude SARS-Cov-2 infection and should not be used as the sole basis for treatment or other patient management decisions. A negative result may occur with  improper specimen collection/handling, submission of specimen other than nasopharyngeal swab, presence of viral mutation(s) within the areas targeted by this assay, and inadequate number of viral copies(<138 copies/mL). A negative result must be combined with clinical observations, patient history, and epidemiological information. The expected result is Negative.  Fact Sheet for Patients:  EntrepreneurPulse.com.au  Fact Sheet for Healthcare Providers:   IncredibleEmployment.be  This test is no t yet approved or cleared by the [place] FDA and  has been authorized for detection and/or diagnosis of SARS-CoV-2 by FDA under an Emergency Use Authorization (EUA). This EUA will remain  in effect (meaning this test can be used) for the duration of the COVID-19 declaration under Section 564(b)(1) of the Act, 21 U.S.C.section 360bbb-3(b)(1), unless the authorization is terminated  or revoked sooner.       Influenza A by PCR NEGATIVE NEGATIVE Final   Influenza B by PCR NEGATIVE NEGATIVE Final    Comment: (NOTE) The Xpert Xpress SARS-CoV-2/FLU/RSV plus assay is intended as an aid in the diagnosis of influenza from Nasopharyngeal swab specimens and should not be used as a sole basis for treatment. Nasal washings and aspirates are unacceptable for Xpert Xpress SARS-CoV-2/FLU/RSV testing.  Fact Sheet for Patients: EntrepreneurPulse.com.au  Fact Sheet for Healthcare Providers: IncredibleEmployment.be  This test is not yet approved or cleared by the [place] FDA and has been authorized for detection and/or diagnosis of SARS-CoV-2 by FDA under an Emergency Use Authorization (EUA). This EUA will remain in effect (meaning this test can be used) for the duration of the COVID-19 declaration under Section 564(b)(1) of the Act, 21 U.S.C. section 360bbb-3(b)(1), unless the authorization is terminated or revoked.  Performed at [facility], [address] [place], [place] [address]   Culture, blood (routine x 2)     Status: None (Preliminary result)   Collection Time: 03/06/21  4:11 PM   Specimen: BLOOD  Result Value Ref Range Status   Specimen Description BLOOD RIGHT ANTECUBITAL  Final   Special Requests   Final    BOTTLES DRAWN AEROBIC AND ANAEROBIC Blood Culture results may not be optimal due to an excessive volume of blood received in culture bottles   Culture   Final     NO GROWTH 3 DAYS Performed at [facility], [address] [place], [place] [address]    Report Status PENDING  Incomplete  Culture, blood (routine x 2)     Status: None (Preliminary result)   Collection Time: 03/06/21  4:11 PM   Specimen: BLOOD  Result Value Ref Range Status   Specimen Description BLOOD BLOOD LEFT ARM  Final   Special Requests   Final    BOTTLES DRAWN AEROBIC AND ANAEROBIC Blood Culture results may not be optimal due to an excessive volume of blood received in culture bottles   Culture   Final    NO GROWTH 3 DAYS Performed at [facility], [address], [place],  [address]    Report Status PENDING  Incomplete  MRSA PCR Screening     Status: None   Collection Time: 03/06/21 11:43 PM  Result Value Ref Range Status   MRSA by PCR NEGATIVE NEGATIVE Final    Comment:  The GeneXpert MRSA Assay (FDA approved for NASAL specimens only), is one component of a comprehensive MRSA colonization surveillance program. It is not intended to diagnose MRSA infection nor to guide or monitor treatment for MRSA infections. Performed at [facility], [address] [place], [place] [address]     Coagulation Studies: Recent Labs    03/06/21 1657  LABPROT 16.4*  INR 1.3*     Urinalysis: No results for input(s): COLORURINE, LABSPEC, PHURINE, GLUCOSEU, HGBUR, BILIRUBINUR, KETONESUR, PROTEINUR, UROBILINOGEN, NITRITE, LEUKOCYTESUR in the last 72 hours.  Invalid input(s): APPERANCEUR    Imaging: No results found.   Medications:    sodium chloride 10 mL/hr at 03/07/21 1429   dialysis solution 2.5% low-MG/low-CA     dialysis solution 2.5% low-MG/low-CA     promethazine (PHENERGAN) injection (IM or IVPB) 12.5 mg (03/09/21 1109)    amLODipine  5 mg Oral Daily   azithromycin  250 mg Oral Daily   calcium acetate  1,334 mg Oral TID with meals   cefUROXime  250 mg Oral Daily   ferrous sulfate  325 mg Oral TID WC    gentamicin cream  1 application Topical Daily   gentamicin cream  1 application Topical Daily   hydrALAZINE  25 mg Oral Q8H   metoprolol succinate  100 mg Oral Daily   nicotine  14 mg Transdermal Daily   nitroGLYCERIN  0.5 inch Topical Q6H   pantoprazole  40 mg Oral BID   potassium chloride  40 mEq Oral Once   saccharomyces boulardii  250 mg Oral BID   sertraline  25 mg Oral Daily   sodium bicarbonate  1,300 mg Oral BID     Assessment/ Plan:  Mr. Larry [name] is a 54 y.o. white male with end stage renal disease on peritoneal dialysis, hypertension, atrial fibrillation, GERD, depression, who is admitted to [facility] on 03/06/2021 for Hyperkalemia [E87.5] Uremia [N19] ESRD on peritoneal dialysis ([place]) [N18.6, Z99.2] HCAP (healthcare-associated pneumonia) [J18.9]  [facility]  CCPD: 4 cycles, 2 liter fills, fil time 46mn, drain time 137m  EDW of 65.5kg  End Stage Renal Disease on PD: with hyperkalemia  Patient with peritoneal dialysis prescription nonadherence.  Often cutting treatments short as well as skipping some treatments as he was recently playing in a pool league.  He also had uremic pericarditis recently.  - Continue peritoneal dialysis for tonight. 1.5% dextrose - Send cell count and culture on PD fluid - Low threshold to do back up hemodialysis.  - potassium now hypokalemic. Potassium chloride ordered.  - discontinue sodium bicarbonate.  - Avoid ibuprofen and all other NSAIDs    Lab Results  Component Value Date   CREATININE 21.05 (H) 03/09/2021   CREATININE 24.73 (H) 03/08/2021   CREATININE 30.76 (H) 03/07/2021    Intake/Output Summary (Last 24 hours) at 03/09/2021 1128 Last data filed at 03/08/2021 2000 Gross per 24 hour  Intake 540 ml  Output 300 ml  Net 240 ml    2. Anemia of chronic kidney disease: hemoglobin 7.5. normocytic. EPO as outpatient.   3. Hypertension: 145/69 - driven by nausea/vomiting. Continue amlodipine, hydralazine, metoprolol  4.  Secondary Hyperparathyroidism: with hyperphosphatemia and hypocalcemia.  - continue calcium acetate with meals.    LOS: 3 Lecia Esperanza 6/18/202211:28 AM

## 2021-03-09 NOTE — Progress Notes (Signed)
[facility] Progress [facility]  Patient: Larry [name]    XLK:[id number]  DOA: 03/06/2021     Date of Service: the patient was seen and examined on 03/09/2021  Brief hospital course: Past medical history of ESRD on PD, nephrotic syndrome, HTN, GERD, depression, anemia, PAF as of nausea, cough, vomiting. Found to have worsening uremia from noncompliance with PD. Appreciate nephrology assistance. Currently plan is continuing PD for now, may require HD.  Assessment and Plan: 1.  ESRD on PD. Uremia and hyperkalemia Noncompliance with PD therapy. On presentation potassium was 7. BUN serum creatinine still significantly elevated. Potassium actually not low. Appreciate nephrology assistance. Currently continuing PD here in the hospital until clearance improves. Although low threshold to switch to HD given patient is noncompliant behavior outpatient.  2.  Sepsis secondary to healthcare associated pneumonia With leukocytosis, tachycardia and tachypnea met SIRS criteria on admission. Chest x-ray shows possible infiltration. Currently on Rocephin and azithromycin which we will continue. Only antibiotics for 5 days. Cultures so far negative.  MRSA PCR negative.  3.  HTN Continuing current regimen.  4.  Paroxysmal A. fib Not on any medication for anticoagulation. On Lopressor. Currently in the setting of concern with coffee-ground emesis on presentation with anemia holding off on further discussion about anticoagulation.  5.  Coffee-ground emesis Possible eosinophilic esophagitis Hemoglobin currently remaining stable. EGD on 6/16 shows grade C esophagitis with possible eosinophilic esophagitis. Recommendation is for continuing PPI and follow-up in 6 weeks.  6.  Diarrhea Intermittent in nature. Will add probiotics likely from antibiotics. Patient does not have any abdominal tenderness on examination nor he has any leukocytosis.  Less likely infectious in nature.  Body mass  index is 24.79 kg/m.    Interventions:        Diet: Clear liquid diet DVT Prophylaxis:   SCDs Start: 03/06/21 1718    Advance goals of care discussion: Full code  Family Communication: no family was present at bedside, at the time of interview.   Disposition:  Status is: Inpatient  Remains inpatient appropriate because:Inpatient level of care appropriate due to severity of illness  Dispo: The patient is from: Home              Anticipated d/c is to: SNF              Patient currently is not medically stable to d/c.   Difficult to place patient No        Subjective: Reports nausea without any vomiting.  Overnight had 3 loose BM which were greenish in color. Early this morning had 1 regular bowel movement. No abdominal pain.  No chest pain.  No shortness of breath.  No dizziness or lightheadedness.  No cough.  Physical Exam:  General: Appear in mild distress, no Rash; Oral Mucosa Clear, moist. no Abnormal Neck Mass Or lumps, Conjunctiva normal  Cardiovascular: S1 and S2 Present, no Murmur, Respiratory: good respiratory effort, Bilateral Air entry present and CTA, no Crackles, no wheezes Abdomen: Bowel Sound present, Soft and no tenderness Extremities: no Pedal edema Neurology: alert and oriented to time, place, and person affect appropriate. no new focal deficit Gait not checked due to patient safety concerns    Vitals:   03/09/21 0517 03/09/21 0802 03/09/21 1150 03/09/21 1642  BP: (!) 169/82 (!) 145/69 (!) 155/83 (!) 154/84  Pulse: 93 94 83 88  Resp:  _0 Temp:  97.9 F (36.6 C) 98.6 F (37 C) 98.4 F (36.9 C)  TempSrc:    Oral  SpO2:  100% 98% 97%  Weight: 65.5 kg     Height:        Intake/Output Summary (Last 24 hours) at 03/09/2021 1850 Last data filed at 03/09/2021 1200 Gross per 24 hour  Intake 480 ml  Output --  Net 480 ml   Filed Weights   03/07/21 0135 03/07/21 1324 03/09/21 0517  Weight: 69.5 kg 69.5 kg 65.5 kg    Data Reviewed: I  have personally reviewed and interpreted daily labs, tele strips, imaging. I reviewed all nursing notes, pharmacy notes, vitals, pertinent old records I have discussed plan of care as described above with RN and patient/family.  CBC: Recent Labs  Lab 03/06/21 1657 03/06/21 2206 03/07/21 0519 03/08/21 0531 03/09/21 1019  WBC 12.2* 14.1* 9.6 7.8 9.9  HGB 7.9* 8.4* 7.6* 7.4* 7.5*  HCT 23.7* 26.9* 22.8* 21.1* 21.8*  MCV 92.9 98.9 93.1 89.4 89.7  PLT 184 182 179 153 774*   Basic Metabolic Panel: Recent Labs  Lab 03/06/21 1329 03/06/21 1711 03/06/21 1952 03/07/21 0519 03/08/21 1420 03/09/21 1019  NA 136 140  --  140 139 136  K 7.3* 5.5* 6.1* 4.3 3.2* 2.7*  CL 97* 104  --  103 102 99  CO2 <7* 8*  --  10* 16* 19*  GLUCOSE 104* 156*  --  217* 132* 106*  BUN 149* 136*  --  208* 154* 132*  CREATININE 34.05* 27.25*  --  30.76* 24.73* 21.05*  CALCIUM 8.2* 8.1*  --  8.5* 7.5* 7.0*  PHOS  --   --   --   --  11.7* 10.3*    Studies: No results found.  Scheduled Meds:  amLODipine  5 mg Oral Daily   azithromycin  250 mg Oral Daily   calcium acetate  1,334 mg Oral TID with meals   cefUROXime  250 mg Oral Daily   ferrous sulfate  325 mg Oral TID WC   gentamicin cream  1 application Topical Daily   gentamicin cream  1 application Topical Daily   hydrALAZINE  25 mg Oral Q8H   metoprolol succinate  100 mg Oral Daily   nicotine  14 mg Transdermal Daily   nitroGLYCERIN  0.5 inch Topical Q6H   pantoprazole  40 mg Oral BID   saccharomyces boulardii  250 mg Oral BID   sertraline  25 mg Oral Daily   Continuous Infusions:  sodium chloride 10 mL/hr at 03/07/21 1429   dialysis solution 2.5% low-MG/low-CA     dialysis solution 2.5% low-MG/low-CA     promethazine (PHENERGAN) injection (IM or IVPB) 12.5 mg (03/09/21 1704)   PRN Meds: sodium chloride, acetaminophen, albuterol, dextromethorphan-guaiFENesin, heparin, hydrALAZINE, ondansetron (ZOFRAN) IV, promethazine (PHENERGAN) injection (IM  or IVPB)  Time spent: 35 minutes  Author: Berle Mull, MD [facility] 03/09/2021 6:50 PM  To reach On-call, see care teams to locate the attending and reach out via [email or web address]. Between 7PM-7AM, please contact night-coverage If you still have difficulty reaching the attending provider, please page the [facility] (Director on Call) for [facility] on amion for assistance.

## 2021-03-09 NOTE — Progress Notes (Addendum)
At arrival to the bedside this patient was very lethargic and sleepy. Removed his mask multiple times due to nausea which paused the disconnection. . UF was negative (-1314), Average dwell was 1h:90mn, lost dwell was 9 mins. Effluent was slightly cloudy Dr. KJuleen Chinahas been made aware, new orders for cell count and Culture to be entered by MD and will be sent by  RN asap. Patient was disconnected after mask was securely placed, using aseptic technique and per policy and new cap placed.  Pre and post report was given to Care RN

## 2021-03-09 NOTE — Progress Notes (Signed)
Dressing has now been changed, samples taken and access site looks benign. New dressing is in place sterile gauze with tegaderm and gentmicin was applied as ordered.

## 2021-03-09 NOTE — Progress Notes (Signed)
This patient was initiated on his ccpd for the night as per orders. Initial drain was minimal. Dressing was changed in the morning,  exit site was benign, clean dry and intact dressing in place and patient tolerated the initiation process well.

## 2021-03-10 DIAGNOSIS — N19 Unspecified kidney failure: Secondary | ICD-10-CM | POA: Diagnosis not present

## 2021-03-10 LAB — CBC
HCT: 22.3 % — ABNORMAL LOW (ref 39.0–52.0)
Hemoglobin: 7.5 g/dL — ABNORMAL LOW (ref 13.0–17.0)
MCH: 30.6 pg (ref 26.0–34.0)
MCHC: 33.6 g/dL (ref 30.0–36.0)
MCV: 91 fL (ref 80.0–100.0)
Platelets: 129 10*3/uL — ABNORMAL LOW (ref 150–400)
RBC: 2.45 MIL/uL — ABNORMAL LOW (ref 4.22–5.81)
RDW: 16.1 % — ABNORMAL HIGH (ref 11.5–15.5)
WBC: 9.5 10*3/uL (ref 4.0–10.5)
nRBC: 0 % (ref 0.0–0.2)

## 2021-03-10 LAB — RENAL FUNCTION PANEL
Albumin: 2.7 g/dL — ABNORMAL LOW (ref 3.5–5.0)
Anion gap: 18 — ABNORMAL HIGH (ref 5–15)
BUN: 130 mg/dL — ABNORMAL HIGH (ref 6–20)
CO2: 19 mmol/L — ABNORMAL LOW (ref 22–32)
Calcium: 7.1 mg/dL — ABNORMAL LOW (ref 8.9–10.3)
Chloride: 99 mmol/L (ref 98–111)
Creatinine, Ser: 20.83 mg/dL — ABNORMAL HIGH (ref 0.61–1.24)
GFR, Estimated: 2 mL/min — ABNORMAL LOW (ref >60)
Glucose, Bld: 114 mg/dL — ABNORMAL HIGH (ref 70–99)
Phosphorus: 9.2 mg/dL — ABNORMAL HIGH (ref 2.5–4.6)
Potassium: 3 mmol/L — ABNORMAL LOW (ref 3.5–5.1)
Sodium: 136 mmol/L (ref 135–145)

## 2021-03-10 LAB — GLUCOSE, CAPILLARY
Glucose-Capillary: 121 mg/dL — ABNORMAL HIGH (ref 70–99)
Glucose-Capillary: 113 mg/dL — ABNORMAL HIGH (ref 70–99)

## 2021-03-10 MED ORDER — DELFLEX-LC/1.5% DEXTROSE 344 MOSM/L IP SOLN
INTRAPERITONEAL | Status: DC
  Filled 2021-03-10 (×3): qty 3000

## 2021-03-10 NOTE — Progress Notes (Signed)
Initiated ccpd as per orders, initial drain of greater than 1,790 mL, no issues with initiation. Patient denies compalints. Dressing changed in the morning, clean dry and intact.

## 2021-03-10 NOTE — Progress Notes (Signed)
According to care RN, on report this morning, apparently this patient had repeated and unresolved slow flow alarms during the night, despite repositioning. At this RN's arrival to the room, the alarm remained unresolved despite interventions that were done this morning via telephone. Dr. Juleen China was made aware and the treatment was terminated as per DR. Kolluru at fill 2 of 6 fill volume was 1434. Initial drain was 36m, UF was (-1734), effluent in bag was still slightly cloudy. Labs were sent yesterday. Patient denied discomfort, dressing was changed using aseptic technique, gentamicin cream and Tegaderm to secure the dry sterile gauze. Exit site was clean and no redness or drainage was noted.

## 2021-03-10 NOTE — Progress Notes (Signed)
[facility] Progress [facility]  Patient: Larry [name]    SUP:[id number]  DOA: 03/06/2021     Date of Service: the patient was seen and examined on 03/10/2021  Brief hospital course: Past medical history of ESRD on PD, nephrotic syndrome, HTN, GERD, depression, anemia, PAF as of nausea, cough, vomiting. Found to have worsening uremia from noncompliance with PD. Appreciate nephrology assistance. Currently plan is continuing PD for now, may require HD.  Assessment and Plan: 1.  ESRD on PD. Uremia and hyperkalemia Noncompliance with PD therapy. On presentation potassium was 7. BUN serum creatinine still significantly elevated. Potassium actually not low. Appreciate nephrology assistance. Currently continuing PD here in the hospital until clearance improves. Although low threshold to switch to HD given patient's noncompliant behavior outpatient. Family reports developing A. fib after HD last time when he was in the hospital.  Explained that there is always a possibility but clearance is important.  2.  Sepsis secondary to healthcare associated pneumonia With leukocytosis, tachycardia and tachypnea met SIRS criteria on admission. Chest x-ray shows possible infiltration. Currently on Rocephin and azithromycin which we will continue. Only antibiotics for 5 days.  Last day 6/19. Cultures so far negative.  MRSA PCR negative.  3.  HTN Continuing current regimen.  4.  Paroxysmal A. fib Not on any medication for anticoagulation. On Lopressor. Currently in the setting of concern with coffee-ground emesis on presentation with anemia holding off on further discussion about anticoagulation.  5.  Coffee-ground emesis Possible eosinophilic esophagitis Hemoglobin currently remaining stable. EGD on 6/16 shows grade C esophagitis with possible eosinophilic esophagitis. Recommendation is for continuing PPI and follow-up in 6 weeks.  6.  Diarrhea Intermittent in nature. Will add  probiotics likely from antibiotics. Patient does not have any abdominal tenderness on examination nor he has any leukocytosis.  Less likely infectious in nature.  Body mass index is 24.79 kg/m.    Interventions:    Diet: Clear liquid diet DVT Prophylaxis:   SCDs Start: 03/06/21 1718  Advance goals of care discussion: Full code  Family Communication: no family was present at bedside, at the time of interview.   Disposition:  Status is: Inpatient  Remains inpatient appropriate because:Inpatient level of care appropriate due to severity of illness  Dispo: The patient is from: Home              Anticipated d/c is to: SNF              Patient currently is not medically stable to d/c.   Difficult to place patient No  Subjective: No further diarrhea.  Still has nausea.  No abdominal pain no chest pain.  Physical Exam:  General: Appear in mild distress, no Rash; Oral Mucosa Clear, moist. no Abnormal Neck Mass Or lumps, Conjunctiva normal  Cardiovascular: S1 and S2 Present, no Murmur, Respiratory: good respiratory effort, Bilateral Air entry present and CTA, no Crackles, no wheezes Abdomen: Bowel Sound present, Soft and no tenderness Extremities: no Pedal edema Neurology: alert and oriented to time, place, and person affect appropriate. no new focal deficit Gait not checked due to patient safety concerns  Vitals:   03/09/21 2141 03/10/21 0407 03/10/21 0738 03/10/21 1400  BP: (!) 169/83 (!) 171/88 (!) 169/86 (!) 161/86  Pulse: 87 84 89 86  Resp: '18 17 20 18  ' Temp: 98.3 F (36.8 C) 98.5 F (36.9 C) 98.6 F (37 C) 97.9 F (36.6 C)  TempSrc:  Oral Oral Oral  SpO2: 97% 95% 95% 95%  Weight:      Height:        Intake/Output Summary (Last 24 hours) at 03/10/2021 1709 Last data filed at 03/10/2021 1340 Gross per 24 hour  Intake 240 ml  Output --  Net 240 ml    Filed Weights   03/07/21 0135 03/07/21 1324 03/09/21 0517  Weight: 69.5 kg 69.5 kg 65.5 kg    Data  Reviewed: I have personally reviewed and interpreted daily labs, tele strips, imaging. I reviewed all nursing notes, pharmacy notes, vitals, pertinent old records I have discussed plan of care as described above with RN and patient/family.  CBC: Recent Labs  Lab 03/06/21 2206 03/07/21 0519 03/08/21 0531 03/09/21 1019 03/10/21 0623  WBC 14.1* 9.6 7.8 9.9 9.5  HGB 8.4* 7.6* 7.4* 7.5* 7.5*  HCT 26.9* 22.8* 21.1* 21.8* 22.3*  MCV 98.9 93.1 89.4 89.7 91.0  PLT 182 179 153 129* 129*    Basic Metabolic Panel: Recent Labs  Lab 03/06/21 1711 03/06/21 1952 03/07/21 0519 03/08/21 1420 03/09/21 1019 03/10/21 0623  NA 140  --  140 139 136 136  K 5.5* 6.1* 4.3 3.2* 2.7* 3.0*  CL 104  --  103 102 99 99  CO2 8*  --  10* 16* 19* 19*  GLUCOSE 156*  --  217* 132* 106* 114*  BUN 136*  --  208* 154* 132* 130*  CREATININE 27.25*  --  30.76* 24.73* 21.05* 20.83*  CALCIUM 8.1*  --  8.5* 7.5* 7.0* 7.1*  PHOS  --   --   --  11.7* 10.3* 9.2*     Studies: No results found.  Scheduled Meds:  amLODipine  5 mg Oral Daily   azithromycin  250 mg Oral Daily   calcium acetate  1,334 mg Oral TID with meals   cefUROXime  250 mg Oral Daily   ferrous sulfate  325 mg Oral TID WC   gentamicin cream  1 application Topical Daily   gentamicin cream  1 application Topical Daily   hydrALAZINE  25 mg Oral Q8H   metoprolol succinate  100 mg Oral Daily   nicotine  14 mg Transdermal Daily   nitroGLYCERIN  0.5 inch Topical Q6H   pantoprazole  40 mg Oral BID   saccharomyces boulardii  250 mg Oral BID   sertraline  25 mg Oral Daily   Continuous Infusions:  sodium chloride 10 mL/hr at 03/07/21 1429   dialysis solution 1.5% low-MG/low-CA     promethazine (PHENERGAN) injection (IM or IVPB) 12.5 mg (03/10/21 1231)   PRN Meds: sodium chloride, acetaminophen, albuterol, dextromethorphan-guaiFENesin, heparin, hydrALAZINE, ondansetron (ZOFRAN) IV, promethazine (PHENERGAN) injection (IM or IVPB)  Time spent: 35  minutes  Author: Berle Mull, MD [facility] 03/10/2021 5:09 PM  To reach On-call, see care teams to locate the attending and reach out via [email or web address]. Between 7PM-7AM, please contact night-coverage If you still have difficulty reaching the attending provider, please page the [facility] (Director on Call) for [facility] on amion for assistance.

## 2021-03-10 NOTE — Progress Notes (Signed)
[facility]  ROUNDING NOTE   Subjective:   Peritoneal dialysis last night. Only got 2 exchanges then stopped working.   Wife at bedside.   Patient wants to try peritoneal dialysis again tonight.   States his nausea, vomiting and diarrhea have improved.   Objective:  Vital signs in last 24 hours:  Temp:  [98.3 F (36.8 C)-98.6 F (37 C)] 98.6 F (37 C) (06/19 0738) Pulse Rate:  [83-89] 89 (06/19 0738) Resp:  [16-20] 20 (06/19 0738) BP: (154-171)/(83-88) 169/86 (06/19 0738) SpO2:  [95 %-98 %] 95 % (06/19 0738)  Weight change:  Filed Weights   03/07/21 0135 03/07/21 1324 03/09/21 0517  Weight: 69.5 kg 69.5 kg 65.5 kg    Intake/Output: I/O last 3 completed shifts: In: 480 [P.O.:480] Out: -    Intake/Output this shift:  Total I/O In: 240 [P.O.:240] Out: -   Physical Exam: General: NAD, laying in bed  Head: Normocephalic, atraumatic. Moist oral mucosal membranes  Eyes: Anicteric  Lungs:  Clear to auscultation, normal effort  Heart: Regular rate and rhythm  Abdomen:  Soft, nontender, nondistended  Extremities: no peripheral edema.  Neurologic: Nonfocal, moving all four extremities  Skin: No lesions  Access: PD catheter    Basic Metabolic Panel: Recent Labs  Lab 03/06/21 1711 03/06/21 1952 03/07/21 0519 03/08/21 1420 03/09/21 1019 03/10/21 0623  NA 140  --  140 139 136 136  K 5.5* 6.1* 4.3 3.2* 2.7* 3.0*  CL 104  --  103 102 99 99  CO2 8*  --  10* 16* 19* 19*  GLUCOSE 156*  --  217* 132* 106* 114*  BUN 136*  --  208* 154* 132* 130*  CREATININE 27.25*  --  30.76* 24.73* 21.05* 20.83*  CALCIUM 8.1*  --  8.5* 7.5* 7.0* 7.1*  PHOS  --   --   --  11.7* 10.3* 9.2*     Liver Function Tests: Recent Labs  Lab 03/06/21 1329 03/08/21 1420 03/09/21 1019 03/10/21 0623  AST 12*  --   --   --   ALT 17  --   --   --   ALKPHOS 62  --   --   --   BILITOT 1.0  --   --   --   PROT 6.2*  --   --   --   ALBUMIN 3.2* 2.7* 2.7* 2.7*    Recent  Labs  Lab 03/06/21 1329  LIPASE 112*    No results for input(s): AMMONIA in the last 168 hours.  CBC: Recent Labs  Lab 03/06/21 2206 03/07/21 0519 03/08/21 0531 03/09/21 1019 03/10/21 0623  WBC 14.1* 9.6 7.8 9.9 9.5  HGB 8.4* 7.6* 7.4* 7.5* 7.5*  HCT 26.9* 22.8* 21.1* 21.8* 22.3*  MCV 98.9 93.1 89.4 89.7 91.0  PLT 182 179 153 129* 129*     Cardiac Enzymes: No results for input(s): CKTOTAL, CKMB, CKMBINDEX, TROPONINI in the last 168 hours.  BNP: Invalid input(s): POCBNP  CBG: Recent Labs  Lab 03/09/21 0802 03/09/21 1150 03/09/21 1639 03/09/21 2136 03/10/21 0758  GLUCAP 97 115* 105* 158* 121*     Microbiology: Results for orders placed or performed during the hospital encounter of 03/06/21  Resp Panel by RT-PCR (Flu A&B, Covid) Nasopharyngeal Swab     Status: None   Collection Time: 03/06/21  2:01 PM   Specimen: Nasopharyngeal Swab; Nasopharyngeal(NP) swabs in vial transport medium  Result Value Ref Range Status   SARS Coronavirus 2 by RT PCR NEGATIVE  NEGATIVE Final    Comment: (NOTE) SARS-CoV-2 target nucleic acids are NOT DETECTED.  The SARS-CoV-2 RNA is generally detectable in upper respiratory specimens during the acute phase of infection. The lowest concentration of SARS-CoV-2 viral copies this assay can detect is 138 copies/mL. A negative result does not preclude SARS-Cov-2 infection and should not be used as the sole basis for treatment or other patient management decisions. A negative result may occur with  improper specimen collection/handling, submission of specimen other than nasopharyngeal swab, presence of viral mutation(s) within the areas targeted by this assay, and inadequate number of viral copies(<138 copies/mL). A negative result must be combined with clinical observations, patient history, and epidemiological information. The expected result is Negative.  Fact Sheet for Patients:  EntrepreneurPulse.com.au  Fact  Sheet for Healthcare Providers:  IncredibleEmployment.be  This test is no t yet approved or cleared by the [place] FDA and  has been authorized for detection and/or diagnosis of SARS-CoV-2 by FDA under an Emergency Use Authorization (EUA). This EUA will remain  in effect (meaning this test can be used) for the duration of the COVID-19 declaration under Section 564(b)(1) of the Act, 21 U.S.C.section 360bbb-3(b)(1), unless the authorization is terminated  or revoked sooner.       Influenza A by PCR NEGATIVE NEGATIVE Final   Influenza B by PCR NEGATIVE NEGATIVE Final    Comment: (NOTE) The Xpert Xpress SARS-CoV-2/FLU/RSV plus assay is intended as an aid in the diagnosis of influenza from Nasopharyngeal swab specimens and should not be used as a sole basis for treatment. Nasal washings and aspirates are unacceptable for Xpert Xpress SARS-CoV-2/FLU/RSV testing.  Fact Sheet for Patients: EntrepreneurPulse.com.au  Fact Sheet for Healthcare Providers: IncredibleEmployment.be  This test is not yet approved or cleared by the [place] FDA and has been authorized for detection and/or diagnosis of SARS-CoV-2 by FDA under an Emergency Use Authorization (EUA). This EUA will remain in effect (meaning this test can be used) for the duration of the COVID-19 declaration under Section 564(b)(1) of the Act, 21 U.S.C. section 360bbb-3(b)(1), unless the authorization is terminated or revoked.  Performed at [facility], [address] [place] , [place] [address]   Culture, blood (routine x 2)     Status: None (Preliminary result)   Collection Time: 03/06/21  4:11 PM   Specimen: BLOOD  Result Value Ref Range Status   Specimen Description BLOOD RIGHT ANTECUBITAL  Final   Special Requests   Final    BOTTLES DRAWN AEROBIC AND ANAEROBIC Blood Culture results may not be optimal due to an excessive volume of blood received in  culture bottles   Culture   Final    NO GROWTH 4 DAYS Performed at [facility], [address], [place], [place] [address]    Report Status PENDING  Incomplete  Culture, blood (routine x 2)     Status: None (Preliminary result)   Collection Time: 03/06/21  4:11 PM   Specimen: BLOOD  Result Value Ref Range Status   Specimen Description BLOOD BLOOD LEFT ARM  Final   Special Requests   Final    BOTTLES DRAWN AEROBIC AND ANAEROBIC Blood Culture results may not be optimal due to an excessive volume of blood received in culture bottles   Culture   Final    NO GROWTH 4 DAYS Performed at [facility], [address], [place], [place] [address]    Report Status PENDING  Incomplete  MRSA PCR Screening     Status:  None   Collection Time: 03/06/21 11:43 PM  Result Value Ref Range Status   MRSA by PCR NEGATIVE NEGATIVE Final    Comment:        The GeneXpert MRSA Assay (FDA approved for NASAL specimens only), is one component of a comprehensive MRSA colonization surveillance program. It is not intended to diagnose MRSA infection nor to guide or monitor treatment for MRSA infections. Performed at [facility], [address] [place], [place] [address]   Body fluid culture w Gram Stain     Status: None (Preliminary result)   Collection Time: 03/09/21 11:30 AM   Specimen: Peritoneal Dialysate; Body Fluid  Result Value Ref Range Status   Specimen Description   Final    PERITONEAL DIALYSATE Performed at [facility], [address] [place], [place] [address]    Special Requests   Final    NONE Performed at [facility], [address] [place], [place] [address]    Gram Stain   Final    RARE WBC PRESENT, PREDOMINANTLY MONONUCLEAR NO ORGANISMS SEEN    Culture   Final    NO GROWTH < 24 HOURS Performed at [facility], [address] [place], [place] [address]    Report Status PENDING  Incomplete    Coagulation  Studies: No results for input(s): LABPROT, INR in the last 72 hours.   Urinalysis: No results for input(s): COLORURINE, LABSPEC, PHURINE, GLUCOSEU, HGBUR, BILIRUBINUR, KETONESUR, PROTEINUR, UROBILINOGEN, NITRITE, LEUKOCYTESUR in the last 72 hours.  Invalid input(s): APPERANCEUR    Imaging: No results found.   Medications:    sodium chloride 10 mL/hr at 03/07/21 1429   dialysis solution 2.5% low-MG/low-CA     dialysis solution 2.5% low-MG/low-CA     promethazine (PHENERGAN) injection (IM or IVPB) 12.5 mg (03/10/21 0403)    amLODipine  5 mg Oral Daily   azithromycin  250 mg Oral Daily   calcium acetate  1,334 mg Oral TID with meals   cefUROXime  250 mg Oral Daily   ferrous sulfate  325 mg Oral TID WC   gentamicin cream  1 application Topical Daily   gentamicin cream  1 application Topical Daily   hydrALAZINE  25 mg Oral Q8H   metoprolol succinate  100 mg Oral Daily   nicotine  14 mg Transdermal Daily   nitroGLYCERIN  0.5 inch Topical Q6H   pantoprazole  40 mg Oral BID   saccharomyces boulardii  250 mg Oral BID   sertraline  25 mg Oral Daily     Assessment/ Plan:  Larry [name] is a 55 y.o. white male with end stage renal disease on peritoneal dialysis, hypertension, atrial fibrillation, GERD, depression, who is admitted to [facility] on 03/06/2021 for Hyperkalemia [E87.5] Uremia [N19] ESRD on peritoneal dialysis ([place]) [N18.6, Z99.2] HCAP (healthcare-associated pneumonia) [J18.9]  [facility]  CCPD: 4 cycles, 2 liter fills, fil time 74mn, drain time 130m  EDW of 65.5kg  End Stage Renal Disease on PD: with hyperkalemia (now with hypokalemia) Patient with peritoneal dialysis prescription nonadherence.  Often cutting treatments short as well as skipping some treatments and being in a swimming pool (not allowed on PD).  He also had uremic pericarditis recently.  Cell count not consistent with peritonitis.  - Continue peritoneal dialysis for tonight. 1.5%  dextrose - Low threshold to do back up hemodialysis. Patient wants to wait on more night.  - potassium now hypokalemic. Potassium chloride ordered.  - discontinued sodium bicarbonate.  -  Avoid ibuprofen and all other NSAIDs    Lab Results  Component Value Date   CREATININE 20.83 (H) 03/10/2021   CREATININE 21.05 (H) 03/09/2021   CREATININE 24.73 (H) 03/08/2021    Intake/Output Summary (Last 24 hours) at 03/10/2021 1114 Last data filed at 03/10/2021 0930 Gross per 24 hour  Intake 480 ml  Output --  Net 480 ml    2. Anemia of chronic kidney disease: hemoglobin 7.5. normocytic. EPO as outpatient.   3. Hypertension: 169/86 - driven by nausea/vomiting. Continue amlodipine, hydralazine, metoprolol  4. Secondary Hyperparathyroidism: with hyperphosphatemia and hypocalcemia.  - continue calcium acetate with meals.    LOS: 4 Cher Egnor 6/19/202211:14 AM

## 2021-03-11 DIAGNOSIS — N19 Unspecified kidney failure: Secondary | ICD-10-CM | POA: Diagnosis not present

## 2021-03-11 LAB — RENAL FUNCTION PANEL
Albumin: 2.7 g/dL — ABNORMAL LOW (ref 3.5–5.0)
Anion gap: 14 (ref 5–15)
BUN: 100 mg/dL — ABNORMAL HIGH (ref 6–20)
CO2: 23 mmol/L (ref 22–32)
Calcium: 7.4 mg/dL — ABNORMAL LOW (ref 8.9–10.3)
Chloride: 97 mmol/L — ABNORMAL LOW (ref 98–111)
Creatinine, Ser: 17.44 mg/dL — ABNORMAL HIGH (ref 0.61–1.24)
GFR, Estimated: 3 mL/min — ABNORMAL LOW (ref >60)
Glucose, Bld: 129 mg/dL — ABNORMAL HIGH (ref 70–99)
Phosphorus: 6.5 mg/dL — ABNORMAL HIGH (ref 2.5–4.6)
Potassium: 2.7 mmol/L — CL (ref 3.5–5.1)
Sodium: 134 mmol/L — ABNORMAL LOW (ref 135–145)

## 2021-03-11 LAB — CBC
HCT: 22.7 % — ABNORMAL LOW (ref 39.0–52.0)
Hemoglobin: 7.6 g/dL — ABNORMAL LOW (ref 13.0–17.0)
MCH: 30.8 pg (ref 26.0–34.0)
MCHC: 33.5 g/dL (ref 30.0–36.0)
MCV: 91.9 fL (ref 80.0–100.0)
Platelets: 122 10*3/uL — ABNORMAL LOW (ref 150–400)
RBC: 2.47 MIL/uL — ABNORMAL LOW (ref 4.22–5.81)
RDW: 15.9 % — ABNORMAL HIGH (ref 11.5–15.5)
WBC: 8.9 10*3/uL (ref 4.0–10.5)
nRBC: 0 % (ref 0.0–0.2)

## 2021-03-11 LAB — GLUCOSE, CAPILLARY
Glucose-Capillary: 90 mg/dL (ref 70–99)
Glucose-Capillary: 101 mg/dL — ABNORMAL HIGH (ref 70–99)
Glucose-Capillary: 143 mg/dL — ABNORMAL HIGH (ref 70–99)

## 2021-03-11 LAB — CULTURE, BLOOD (ROUTINE X 2)
Culture: NO GROWTH
Culture: NO GROWTH

## 2021-03-11 MED ORDER — PHENOL 1.4 % MT LIQD
1.0000 | OROMUCOSAL | Status: DC | PRN
  Filled 2021-03-11: qty 177

## 2021-03-11 MED ORDER — HYDRALAZINE HCL 50 MG PO TABS
50.0000 mg | ORAL_TABLET | Freq: Three times a day (TID) | ORAL | Status: DC
  Administered 2021-03-11 – 2021-03-12 (×3): 50 mg via ORAL
  Filled 2021-03-11 (×3): qty 1

## 2021-03-11 MED ORDER — AMLODIPINE BESYLATE 10 MG PO TABS
10.0000 mg | ORAL_TABLET | Freq: Every day | ORAL | Status: DC
  Administered 2021-03-12: 10 mg via ORAL
  Filled 2021-03-11: qty 1

## 2021-03-11 MED ORDER — POTASSIUM CHLORIDE CRYS ER 20 MEQ PO TBCR
40.0000 meq | EXTENDED_RELEASE_TABLET | Freq: Once | ORAL | Status: AC
  Administered 2021-03-11: 40 meq via ORAL
  Filled 2021-03-11: qty 2

## 2021-03-11 NOTE — Progress Notes (Signed)
PT Cancellation Note  Patient Details Name: Larry [name] MRN: [id number] DOB: 18-Nov-1965   Cancelled Treatment:    Reason Eval/Treat Not Completed: Other (comment). Consult received and chart reviewed. Pt with hypokalemia this date with K+ at 2.7; not appropriate for exertional activity at this time. Will re-attempt next date.   [name] 03/11/2021, 1:27 PM [name], PT, DPT [phone]

## 2021-03-11 NOTE — Progress Notes (Signed)
[facility]  ROUNDING NOTE   Subjective:   Peritoneal dialysis last night. Tolerated treatment well. He states his diarrhea has improved.   K 2.7    Objective:  Vital signs in last 24 hours:  Temp:  [97.9 F (36.6 C)-99.5 F (37.5 C)] 99.5 F (37.5 C) (06/20 0755) Pulse Rate:  [86-101] 99 (06/20 0755) Resp:  [17-19] 18 (06/20 0755) BP: (161-192)/(78-92) 185/78 (06/20 0755) SpO2:  [93 %-96 %] 94 % (06/20 0755)  Weight change:  Filed Weights   03/07/21 0135 03/07/21 1324 03/09/21 0517  Weight: 69.5 kg 69.5 kg 65.5 kg    Intake/Output: I/O last 3 completed shifts: In: 1200 [P.O.:1200] Out: 450 [Urine:450]   Intake/Output this shift:  No intake/output data recorded.  Physical Exam: General: NAD, laying in bed  Head: Normocephalic, atraumatic. Moist oral mucosal membranes  Eyes: Anicteric  Lungs:  Clear to auscultation, normal effort  Heart: Regular rate and rhythm  Abdomen:  Soft, nontender, nondistended  Extremities: no peripheral edema.  Neurologic: Nonfocal, moving all four extremities  Skin: No lesions  Access: PD catheter    Basic Metabolic Panel: Recent Labs  Lab 03/07/21 0519 03/08/21 1420 03/09/21 1019 03/10/21 0623 03/11/21 0545  NA 140 139 136 136 134*  K 4.3 3.2* 2.7* 3.0* 2.7*  CL 103 102 99 99 97*  CO2 10* 16* 19* 19* 23  GLUCOSE 217* 132* 106* 114* 129*  BUN 208* 154* 132* 130* 100*  CREATININE 30.76* 24.73* 21.05* 20.83* 17.44*  CALCIUM 8.5* 7.5* 7.0* 7.1* 7.4*  PHOS  --  11.7* 10.3* 9.2* 6.5*     Liver Function Tests: Recent Labs  Lab 03/06/21 1329 03/08/21 1420 03/09/21 1019 03/10/21 0623 03/11/21 0545  AST 12*  --   --   --   --   ALT 17  --   --   --   --   ALKPHOS 62  --   --   --   --   BILITOT 1.0  --   --   --   --   PROT 6.2*  --   --   --   --   ALBUMIN 3.2* 2.7* 2.7* 2.7* 2.7*    Recent Labs  Lab 03/06/21 1329  LIPASE 112*    No results for input(s): AMMONIA in the last 168  hours.  CBC: Recent Labs  Lab 03/07/21 0519 03/08/21 0531 03/09/21 1019 03/10/21 0623 03/11/21 0545  WBC 9.6 7.8 9.9 9.5 8.9  HGB 7.6* 7.4* 7.5* 7.5* 7.6*  HCT 22.8* 21.1* 21.8* 22.3* 22.7*  MCV 93.1 89.4 89.7 91.0 91.9  PLT 179 153 129* 129* 122*     Cardiac Enzymes: No results for input(s): CKTOTAL, CKMB, CKMBINDEX, TROPONINI in the last 168 hours.  BNP: Invalid input(s): POCBNP  CBG: Recent Labs  Lab 03/09/21 1639 03/09/21 2136 03/10/21 0758 03/10/21 1139 03/11/21 0754  GLUCAP 105* 158* 121* 113* 90     Microbiology: Results for orders placed or performed during the hospital encounter of 03/06/21  Resp Panel by RT-PCR (Flu A&B, Covid) Nasopharyngeal Swab     Status: None   Collection Time: 03/06/21  2:01 PM   Specimen: Nasopharyngeal Swab; Nasopharyngeal(NP) swabs in vial transport medium  Result Value Ref Range Status   SARS Coronavirus 2 by RT PCR NEGATIVE NEGATIVE Final    Comment: (NOTE) SARS-CoV-2 target nucleic acids are NOT DETECTED.  The SARS-CoV-2 RNA is generally detectable in upper respiratory specimens during the acute phase of infection. The lowest  concentration of SARS-CoV-2 viral copies this assay can detect is 138 copies/mL. A negative result does not preclude SARS-Cov-2 infection and should not be used as the sole basis for treatment or other patient management decisions. A negative result may occur with  improper specimen collection/handling, submission of specimen other than nasopharyngeal swab, presence of viral mutation(s) within the areas targeted by this assay, and inadequate number of viral copies(<138 copies/mL). A negative result must be combined with clinical observations, patient history, and epidemiological information. The expected result is Negative.  Fact Sheet for Patients:  EntrepreneurPulse.com.au  Fact Sheet for Healthcare Providers:  IncredibleEmployment.be  This test is no t  yet approved or cleared by the [place] FDA and  has been authorized for detection and/or diagnosis of SARS-CoV-2 by FDA under an Emergency Use Authorization (EUA). This EUA will remain  in effect (meaning this test can be used) for the duration of the COVID-19 declaration under Section 564(b)(1) of the Act, 21 U.S.C.section 360bbb-3(b)(1), unless the authorization is terminated  or revoked sooner.       Influenza A by PCR NEGATIVE NEGATIVE Final   Influenza B by PCR NEGATIVE NEGATIVE Final    Comment: (NOTE) The Xpert Xpress SARS-CoV-2/FLU/RSV plus assay is intended as an aid in the diagnosis of influenza from Nasopharyngeal swab specimens and should not be used as a sole basis for treatment. Nasal washings and aspirates are unacceptable for Xpert Xpress SARS-CoV-2/FLU/RSV testing.  Fact Sheet for Patients: EntrepreneurPulse.com.au  Fact Sheet for Healthcare Providers: IncredibleEmployment.be  This test is not yet approved or cleared by the [place] FDA and has been authorized for detection and/or diagnosis of SARS-CoV-2 by FDA under an Emergency Use Authorization (EUA). This EUA will remain in effect (meaning this test can be used) for the duration of the COVID-19 declaration under Section 564(b)(1) of the Act, 21 U.S.C. section 360bbb-3(b)(1), unless the authorization is terminated or revoked.  Performed at [facility], [address] [place], [place] [address]   Culture, blood (routine x 2)     Status: None (Preliminary result)   Collection Time: 03/06/21  4:11 PM   Specimen: BLOOD  Result Value Ref Range Status   Specimen Description BLOOD RIGHT ANTECUBITAL  Final   Special Requests   Final    BOTTLES DRAWN AEROBIC AND ANAEROBIC Blood Culture results may not be optimal due to an excessive volume of blood received in culture bottles   Culture   Final    NO GROWTH 4 DAYS Performed at [facility], [address], [place], [place] [address]    Report Status PENDING  Incomplete  Culture, blood (routine x 2)     Status: None (Preliminary result)   Collection Time: 03/06/21  4:11 PM   Specimen: BLOOD  Result Value Ref Range Status   Specimen Description BLOOD BLOOD LEFT ARM  Final   Special Requests   Final    BOTTLES DRAWN AEROBIC AND ANAEROBIC Blood Culture results may not be optimal due to an excessive volume of blood received in culture bottles   Culture   Final    NO GROWTH 4 DAYS Performed at [facility], [address], [place], [place] [address]    Report Status PENDING  Incomplete  MRSA PCR Screening     Status: None   Collection Time: 03/06/21 11:43 PM  Result Value Ref Range Status   MRSA by PCR NEGATIVE NEGATIVE Final    Comment:  The GeneXpert MRSA Assay (FDA approved for NASAL specimens only), is one component of a comprehensive MRSA colonization surveillance program. It is not intended to diagnose MRSA infection nor to guide or monitor treatment for MRSA infections. Performed at [facility], [address] [place], [place] [address]   Body fluid culture w Gram Stain     Status: None (Preliminary result)   Collection Time: 03/09/21 11:30 AM   Specimen: Peritoneal Dialysate; Body Fluid  Result Value Ref Range Status   Specimen Description   Final    PERITONEAL DIALYSATE Performed at [facility], [address] [place], [place] [address]    Special Requests   Final    NONE Performed at [facility], [address] [place], [place] [address]    Gram Stain   Final    RARE WBC PRESENT, PREDOMINANTLY MONONUCLEAR NO ORGANISMS SEEN    Culture   Final    NO GROWTH 2 DAYS Performed at [facility], [place] [address] [place], [place] [address]    Report Status PENDING  Incomplete    Coagulation Studies: No results for input(s): LABPROT, INR in the last 72 hours.   Urinalysis: No results for input(s):  COLORURINE, LABSPEC, PHURINE, GLUCOSEU, HGBUR, BILIRUBINUR, KETONESUR, PROTEINUR, UROBILINOGEN, NITRITE, LEUKOCYTESUR in the last 72 hours.  Invalid input(s): APPERANCEUR    Imaging: No results found.   Medications:    sodium chloride 10 mL/hr at 03/07/21 1429   dialysis solution 1.5% low-MG/low-CA     promethazine (PHENERGAN) injection (IM or IVPB) 12.5 mg (03/11/21 0939)    amLODipine  5 mg Oral Daily   calcium acetate  1,334 mg Oral TID with meals   ferrous sulfate  325 mg Oral TID WC   gentamicin cream  1 application Topical Daily   gentamicin cream  1 application Topical Daily   hydrALAZINE  50 mg Oral Q8H   metoprolol succinate  100 mg Oral Daily   nicotine  14 mg Transdermal Daily   pantoprazole  40 mg Oral BID   potassium chloride  40 mEq Oral Once   saccharomyces boulardii  250 mg Oral BID   sertraline  25 mg Oral Daily     Assessment/ Plan:  Larry [name] is a 55 y.o. white male with end stage renal disease on peritoneal dialysis, hypertension, atrial fibrillation, GERD, depression, who is admitted to [facility] on 03/06/2021 for Hyperkalemia [E87.5] Uremia [N19] ESRD on peritoneal dialysis ([place]) [N18.6, Z99.2] HCAP (healthcare-associated pneumonia) [J18.9]  [facility]  CCPD: 4 cycles, 2 liter fills, fil time 42mn, drain time 156m  EDW of 65.5kg  End Stage Renal Disease on PD: with hyperkalemia (now with hypokalemia) Patient with peritoneal dialysis prescription nonadherence.  Often cutting treatments short as well as skipping some treatments and being in a swimming pool (not allowed on PD).  He also had uremic pericarditis recently.  Cell count not consistent with peritonitis.  - Continue peritoneal dialysis for tonight. 1.5% dextrose - potassium now hypokalemic. Potassium chloride ordered.     Lab Results  Component Value Date   CREATININE 17.44 (H) 03/11/2021   CREATININE 20.83 (H) 03/10/2021   CREATININE 21.05 (H) 03/09/2021     Intake/Output Summary (Last 24 hours) at 03/11/2021 1040 Last data filed at 03/10/2021 2347 Gross per 24 hour  Intake 960 ml  Output 450 ml  Net 510 ml    2. Hypokalemia: secondary to peritoneal dialysis - PO potassium chloride - he has history  of hyperkalemia so ARB and potassium supplements have been held in past.   3. Anemia of chronic kidney disease: hemoglobin 7.6. normocytic. EPO as outpatient.   4. Hypertension: 185/78 - not at goal. Continue amlodipine, hydralazine, metoprolol. Will consider adding an ARB for both potassium and blood pressure control if further control required.   5. Secondary Hyperparathyroidism: with hyperphosphatemia and hypocalcemia.  - continue calcium acetate with meals.    LOS: 5 Inice Sanluis 6/20/202210:40 AM

## 2021-03-11 NOTE — Progress Notes (Signed)
Date and time results received: 03/11/21 8:14 AM  Critical Value: K 2.7  Name of Provider Notified: Dr. Posey Pronto   Orders Received? Or Actions Taken?: MD notified

## 2021-03-11 NOTE — Progress Notes (Signed)
[facility] Progress Note  Patient: Larry [name]    AJG:[id number]  DOA: 03/06/2021     Date of Service: the patient was seen and examined on 03/11/2021  Brief hospital course: Past medical history of ESRD on PD, nephrotic syndrome, HTN, GERD, depression, anemia, PAF as of nausea, cough, vomiting. Found to have worsening uremia from noncompliance with PD. Appreciate nephrology assistance. Currently plan is continuing PD for now.   Assessment and Plan: 1.  ESRD on PD. Uremia and hyperkalemia Noncompliance with PD therapy. Hypokalemia  On presentation potassium was 7. BUN serum creatinine still significantly elevated. Potassium actually not low. Appreciate nephrology assistance. Currently continuing PD here in the hospital until clearance improves. Although low threshold to switch to HD given patient's noncompliant behavior outpatient. Potassium replaced.  Regular Diet per Nephrology  Family reports developing A. fib after HD last time when he was in the hospital. Explained that there is always a possibility but clearance is important.  2.  Sepsis secondary to healthcare associated pneumonia With leukocytosis, tachycardia and tachypnea met SIRS criteria on admission. Chest x-ray shows possible infiltration. Currently on Rocephin and azithromycin which we will continue. Only antibiotics for 5 days. Last day 6/19. Cultures so far negative. MRSA PCR negative.  3.  HTN Continuing current regimen.  4.  Paroxysmal A. fib Not on any medication for anticoagulation. On Lopressor. Currently in the setting of concern with coffee-ground emesis on presentation with anemia holding off on further discussion about anticoagulation.  5.  Coffee-ground emesis Possible eosinophilic esophagitis Hemoglobin currently remaining stable. EGD on 6/16 shows grade C esophagitis with possible eosinophilic esophagitis. Recommendation is for continuing PPI and follow-up in 6 weeks. Diet  advance.   6.  Diarrhea Intermittent in nature. Will add probiotics likely from antibiotics. Patient does not have any abdominal tenderness on examination nor he has any leukocytosis.  Less likely infectious in nature.  Body mass index is 24.79 kg/m.    Interventions:    Diet: regular diet DVT Prophylaxis:   SCDs Start: 03/06/21 1718  Advance goals of care discussion: Full code  Family Communication: no family was present at bedside, at the time of interview.   Disposition:  Status is: Inpatient  Remains inpatient appropriate because:Inpatient level of care appropriate due to severity of illness  Dispo: The patient is from: Home              Anticipated d/c is to: SNF              Patient currently is not medically stable to d/c.   Difficult to place patient No  Subjective: pt feels better, no nausea or vomiting. No fever or chills. Still has shortness of breath.   Physical Exam:  General: Appear in mild distress, no Rash; Oral Mucosa Clear, moist. no Abnormal Neck Mass Or lumps, Conjunctiva normal  Cardiovascular: S1 and S2 Present, no Murmur, Respiratory: good respiratory effort, Bilateral Air entry present and CTA, no Crackles, no wheezes Abdomen: Bowel Sound present, Soft and no tenderness Extremities: no Pedal edema Neurology: alert and oriented to time, place, and person affect appropriate. no new focal deficit Gait not checked due to patient safety concerns  Vitals:   03/11/21 0751 03/11/21 0755 03/11/21 1200 03/11/21 1745  BP: (!) 190/80 (!) 185/78 (!) 174/81 (!) 180/156  Pulse: (!) 101 99 97 89  Resp: '18 18 20 ' (!) 30  Temp:  99.5 F (37.5 C) 98.4 F (36.9 C) 99.5 F (37.5 C)  TempSrc:  SpO2: 94% 94% 95% 97%  Weight:      Height:        Intake/Output Summary (Last 24 hours) at 03/11/2021 1811 Last data filed at 03/11/2021 1030 Gross per 24 hour  Intake 600 ml  Output --  Net 600 ml    Filed Weights   03/07/21 0135 03/07/21 1324 03/09/21  0517  Weight: 69.5 kg 69.5 kg 65.5 kg    Data Reviewed: I have personally reviewed and interpreted daily labs, tele strips, imaging. I reviewed all nursing notes, pharmacy notes, vitals, pertinent old records I have discussed plan of care as described above with RN and patient/family.  CBC: Recent Labs  Lab 03/07/21 0519 03/08/21 0531 03/09/21 1019 03/10/21 0623 03/11/21 0545  WBC 9.6 7.8 9.9 9.5 8.9  HGB 7.6* 7.4* 7.5* 7.5* 7.6*  HCT 22.8* 21.1* 21.8* 22.3* 22.7*  MCV 93.1 89.4 89.7 91.0 91.9  PLT 179 153 129* 129* 122*    Basic Metabolic Panel: Recent Labs  Lab 03/07/21 0519 03/08/21 1420 03/09/21 1019 03/10/21 0623 03/11/21 0545  NA 140 139 136 136 134*  K 4.3 3.2* 2.7* 3.0* 2.7*  CL 103 102 99 99 97*  CO2 10* 16* 19* 19* 23  GLUCOSE 217* 132* 106* 114* 129*  BUN 208* 154* 132* 130* 100*  CREATININE 30.76* 24.73* 21.05* 20.83* 17.44*  CALCIUM 8.5* 7.5* 7.0* 7.1* 7.4*  PHOS  --  11.7* 10.3* 9.2* 6.5*     Studies: No results found.  Scheduled Meds:  [START ON 03/12/2021] amLODipine  10 mg Oral Daily   calcium acetate  1,334 mg Oral TID with meals   ferrous sulfate  325 mg Oral TID WC   gentamicin cream  1 application Topical Daily   gentamicin cream  1 application Topical Daily   hydrALAZINE  50 mg Oral Q8H   metoprolol succinate  100 mg Oral Daily   nicotine  14 mg Transdermal Daily   pantoprazole  40 mg Oral BID   saccharomyces boulardii  250 mg Oral BID   sertraline  25 mg Oral Daily   Continuous Infusions:  sodium chloride 10 mL/hr at 03/07/21 1429   dialysis solution 1.5% low-MG/low-CA     promethazine (PHENERGAN) injection (IM or IVPB) 12.5 mg (03/11/21 1409)   PRN Meds: sodium chloride, acetaminophen, albuterol, dextromethorphan-guaiFENesin, heparin, hydrALAZINE, ondansetron (ZOFRAN) IV, phenol, promethazine (PHENERGAN) injection (IM or IVPB)  Time spent: 35 minutes  Author: Berle Mull, MD [facility] 03/11/2021 6:11 PM  To reach  On-call, see care teams to locate the attending and reach out via [email or web address]. Between 7PM-7AM, please contact night-coverage If you still have difficulty reaching the attending provider, please page the [facility] (Director on Call) for [facility] on amion for assistance.

## 2021-03-11 NOTE — Care Management Important Message (Signed)
Important Message  Patient Details  Name: Larry [name] MRN: [id number] Date of Birth: 1966-08-03   Medicare Important Message Given:  Yes     Dannette Barbara 03/11/2021, 11:56 AM

## 2021-03-11 NOTE — Plan of Care (Signed)

## 2021-03-12 DIAGNOSIS — N19 Unspecified kidney failure: Secondary | ICD-10-CM | POA: Diagnosis not present

## 2021-03-12 LAB — RENAL FUNCTION PANEL
Albumin: 2.7 g/dL — ABNORMAL LOW (ref 3.5–5.0)
Anion gap: 14 (ref 5–15)
BUN: 81 mg/dL — ABNORMAL HIGH (ref 6–20)
CO2: 24 mmol/L (ref 22–32)
Calcium: 7.2 mg/dL — ABNORMAL LOW (ref 8.9–10.3)
Chloride: 97 mmol/L — ABNORMAL LOW (ref 98–111)
Creatinine, Ser: 16.11 mg/dL — ABNORMAL HIGH (ref 0.61–1.24)
GFR, Estimated: 3 mL/min — ABNORMAL LOW (ref >60)
Glucose, Bld: 104 mg/dL — ABNORMAL HIGH (ref 70–99)
Phosphorus: 6.4 mg/dL — ABNORMAL HIGH (ref 2.5–4.6)
Potassium: 2.4 mmol/L — CL (ref 3.5–5.1)
Sodium: 135 mmol/L (ref 135–145)

## 2021-03-12 LAB — GLUCOSE, CAPILLARY: Glucose-Capillary: 130 mg/dL — ABNORMAL HIGH (ref 70–99)

## 2021-03-12 LAB — CBC
HCT: 22.3 % — ABNORMAL LOW (ref 39.0–52.0)
Hemoglobin: 7.4 g/dL — ABNORMAL LOW (ref 13.0–17.0)
MCH: 30.5 pg (ref 26.0–34.0)
MCHC: 33.2 g/dL (ref 30.0–36.0)
MCV: 91.8 fL (ref 80.0–100.0)
Platelets: 126 10*3/uL — ABNORMAL LOW (ref 150–400)
RBC: 2.43 MIL/uL — ABNORMAL LOW (ref 4.22–5.81)
RDW: 15.8 % — ABNORMAL HIGH (ref 11.5–15.5)
WBC: 7.8 10*3/uL (ref 4.0–10.5)
nRBC: 0 % (ref 0.0–0.2)

## 2021-03-12 LAB — SURGICAL PATHOLOGY

## 2021-03-12 MED ORDER — POTASSIUM CHLORIDE 20 MEQ PO PACK
40.0000 meq | PACK | Freq: Once | ORAL | Status: AC
  Administered 2021-03-12: 40 meq via ORAL
  Filled 2021-03-12: qty 2

## 2021-03-12 MED ORDER — POTASSIUM CHLORIDE CRYS ER 20 MEQ PO TBCR: 20.0000 meq | EXTENDED_RELEASE_TABLET | Freq: Every day | ORAL | 0 refills | Status: DC

## 2021-03-12 MED ORDER — FERROUS SULFATE 325 (65 FE) MG PO TABS: 325.0000 mg | ORAL_TABLET | Freq: Three times a day (TID) | ORAL | 0 refills | Status: DC

## 2021-03-12 MED ORDER — POTASSIUM CHLORIDE 20 MEQ PO PACK
40.0000 meq | PACK | ORAL | Status: AC
  Administered 2021-03-12 (×2): 40 meq via ORAL
  Filled 2021-03-12 (×2): qty 2

## 2021-03-12 MED ORDER — LOSARTAN POTASSIUM 50 MG PO TABS: 100.0000 mg | ORAL_TABLET | Freq: Every day | ORAL | 0 refills | Status: DC

## 2021-03-12 MED ORDER — POTASSIUM CHLORIDE 20 MEQ PO PACK: 40.0000 meq | PACK | Freq: Once | ORAL | Status: DC

## 2021-03-12 MED ORDER — PANTOPRAZOLE SODIUM 40 MG PO TBEC: 40.0000 mg | DELAYED_RELEASE_TABLET | Freq: Two times a day (BID) | ORAL | 0 refills | Status: DC

## 2021-03-12 MED ORDER — LOSARTAN POTASSIUM 50 MG PO TABS
100.0000 mg | ORAL_TABLET | Freq: Every day | ORAL | Status: DC
  Administered 2021-03-12: 100 mg via ORAL
  Filled 2021-03-12: qty 2

## 2021-03-12 MED ORDER — ONDANSETRON HCL 4 MG PO TABS: 4.0000 mg | ORAL_TABLET | Freq: Every day | ORAL | 0 refills | Status: AC | PRN

## 2021-03-12 NOTE — Progress Notes (Signed)
PT Cancellation Note  Patient Details Name: Larry [name] MRN: [id number] DOB: 12-May-1966   Cancelled Treatment:    Reason Eval/Treat Not Completed: Other (comment). Pt with decreased K+ at this time, down from 2.7 to 2.4 this date. Not appropriate for PT intervention at this time. Will re-attempt next date if medically stable.   [name] 03/12/2021, 8:47 AM [name], PT, DPT [phone]

## 2021-03-12 NOTE — Progress Notes (Signed)
This RN provided discharge instructions and teaching to the patient. The patient verbalized and demonstrated understanding of the provided instructions. All outstanding questions resolved. L arm PIV removed. Cannula intact. Pt tolerated well. All belongings packed and in tow. Volunteer services to transport patient to private vehicle via wheelchair at time of departure.

## 2021-03-12 NOTE — Plan of Care (Signed)
  Problem: Education: Goal: Knowledge of General Education information will improve Description: Including pain rating scale, medication(s)/side effects and non-pharmacologic comfort measures 03/12/2021 1018 by Cristela Blue, RN Outcome: Progressing 03/12/2021 1018 by Cristela Blue, RN Outcome: Progressing   Problem: Health Behavior/Discharge Planning: Goal: Ability to manage health-related needs will improve 03/12/2021 1018 by Cristela Blue, RN Outcome: Progressing 03/12/2021 1018 by Cristela Blue, RN Outcome: Progressing   Problem: Clinical Measurements: Goal: Ability to maintain clinical measurements within normal limits will improve 03/12/2021 1018 by Cristela Blue, RN Outcome: Progressing 03/12/2021 1018 by Cristela Blue, RN Outcome: Progressing Goal: Will remain free from infection 03/12/2021 1018 by Cristela Blue, RN Outcome: Progressing 03/12/2021 1018 by Cristela Blue, RN Outcome: Progressing Goal: Diagnostic test results will improve 03/12/2021 1018 by Cristela Blue, RN Outcome: Progressing 03/12/2021 1018 by Cristela Blue, RN Outcome: Progressing Goal: Respiratory complications will improve 03/12/2021 1018 by Cristela Blue, RN Outcome: Progressing 03/12/2021 1018 by Cristela Blue, RN Outcome: Progressing Goal: Cardiovascular complication will be avoided 03/12/2021 1018 by Cristela Blue, RN Outcome: Progressing 03/12/2021 1018 by Cristela Blue, RN Outcome: Progressing   Problem: Activity: Goal: Risk for activity intolerance will decrease 03/12/2021 1018 by Cristela Blue, RN Outcome: Progressing 03/12/2021 1018 by Cristela Blue, RN Outcome: Progressing   Problem: Nutrition: Goal: Adequate nutrition will be maintained 03/12/2021 1018 by Cristela Blue, RN Outcome: Progressing 03/12/2021 1018 by Cristela Blue, RN Outcome: Progressing   Problem: Coping: Goal: Level of anxiety will decrease 03/12/2021 1018 by Cristela Blue, RN Outcome:  Progressing 03/12/2021 1018 by Cristela Blue, RN Outcome: Progressing   Problem: Elimination: Goal: Will not experience complications related to bowel motility 03/12/2021 1018 by Cristela Blue, RN Outcome: Progressing 03/12/2021 1018 by Cristela Blue, RN Outcome: Progressing Goal: Will not experience complications related to urinary retention 03/12/2021 1018 by Cristela Blue, RN Outcome: Progressing 03/12/2021 1018 by Cristela Blue, RN Outcome: Progressing   Problem: Pain Managment: Goal: General experience of comfort will improve 03/12/2021 1018 by Cristela Blue, RN Outcome: Progressing 03/12/2021 1018 by Cristela Blue, RN Outcome: Progressing   Problem: Safety: Goal: Ability to remain free from injury will improve 03/12/2021 1018 by Cristela Blue, RN Outcome: Progressing 03/12/2021 1018 by Cristela Blue, RN Outcome: Progressing   Problem: Skin Integrity: Goal: Risk for impaired skin integrity will decrease 03/12/2021 1018 by Cristela Blue, RN Outcome: Progressing 03/12/2021 1018 by Cristela Blue, RN Outcome: Progressing

## 2021-03-12 NOTE — Progress Notes (Signed)
Notified Dr. Argie Ramming patient's potassium is 2.4. Orders received, see MAR. Will continue to monitor.

## 2021-03-12 NOTE — Progress Notes (Signed)
[facility]  ROUNDING NOTE   Subjective:   Peritoneal dialysis last night. Tolerated treatment well.   Patient states he is ready to go home.   K 2.4   Objective:  Vital signs in last 24 hours:  Temp:  [98.4 F (36.9 C)-99.5 F (37.5 C)] 99 F (37.2 C) (06/21 0749) Pulse Rate:  [84-97] 87 (06/21 0749) Resp:  [18-30] 19 (06/21 0412) BP: (144-192)/(62-156) 167/131 (06/21 0749) SpO2:  [92 %-97 %] 92 % (06/21 0749)  Weight change:  Filed Weights   03/07/21 0135 03/07/21 1324 03/09/21 0517  Weight: 69.5 kg 69.5 kg 65.5 kg    Intake/Output: I/O last 3 completed shifts: In: 600 [P.O.:600] Out: 150 [Urine:150]   Intake/Output this shift:  No intake/output data recorded.  Physical Exam: General: NAD, laying in bed  Head: Normocephalic, atraumatic. Moist oral mucosal membranes  Eyes: Anicteric  Lungs:  Clear to auscultation, normal effort  Heart: Regular rate and rhythm  Abdomen:  Soft, nontender, nondistended  Extremities: no peripheral edema.  Neurologic: Nonfocal, moving all four extremities  Skin: No lesions  Access: PD catheter    Basic Metabolic Panel: Recent Labs  Lab 03/08/21 1420 03/09/21 1019 03/10/21 0623 03/11/21 0545 03/12/21 0544  NA 139 136 136 134* 135  K 3.2* 2.7* 3.0* 2.7* 2.4*  CL 102 99 99 97* 97*  CO2 16* 19* 19* 23 24  GLUCOSE 132* 106* 114* 129* 104*  BUN 154* 132* 130* 100* 81*  CREATININE 24.73* 21.05* 20.83* 17.44* 16.11*  CALCIUM 7.5* 7.0* 7.1* 7.4* 7.2*  PHOS 11.7* 10.3* 9.2* 6.5* 6.4*     Liver Function Tests: Recent Labs  Lab 03/06/21 1329 03/08/21 1420 03/09/21 1019 03/10/21 0623 03/11/21 0545 03/12/21 0544  AST 12*  --   --   --   --   --   ALT 17  --   --   --   --   --   ALKPHOS 62  --   --   --   --   --   BILITOT 1.0  --   --   --   --   --   PROT 6.2*  --   --   --   --   --   ALBUMIN 3.2* 2.7* 2.7* 2.7* 2.7* 2.7*    Recent Labs  Lab 03/06/21 1329  LIPASE 112*    No results for  input(s): AMMONIA in the last 168 hours.  CBC: Recent Labs  Lab 03/08/21 0531 03/09/21 1019 03/10/21 0623 03/11/21 0545 03/12/21 0544  WBC 7.8 9.9 9.5 8.9 7.8  HGB 7.4* 7.5* 7.5* 7.6* 7.4*  HCT 21.1* 21.8* 22.3* 22.7* 22.3*  MCV 89.4 89.7 91.0 91.9 91.8  PLT 153 129* 129* 122* 126*     Cardiac Enzymes: No results for input(s): CKTOTAL, CKMB, CKMBINDEX, TROPONINI in the last 168 hours.  BNP: Invalid input(s): POCBNP  CBG: Recent Labs  Lab 03/10/21 1139 03/11/21 0754 03/11/21 1159 03/11/21 1743 03/12/21 0747  GLUCAP 113* 90 101* 143* 130*     Microbiology: Results for orders placed or performed during the hospital encounter of 03/06/21  Resp Panel by RT-PCR (Flu A&B, Covid) Nasopharyngeal Swab     Status: None   Collection Time: 03/06/21  2:01 PM   Specimen: Nasopharyngeal Swab; Nasopharyngeal(NP) swabs in vial transport medium  Result Value Ref Range Status   SARS Coronavirus 2 by RT PCR NEGATIVE NEGATIVE Final    Comment: (NOTE) SARS-CoV-2 target nucleic acids are NOT DETECTED.  The SARS-CoV-2 RNA is generally detectable in upper respiratory specimens during the acute phase of infection. The lowest concentration of SARS-CoV-2 viral copies this assay can detect is 138 copies/mL. A negative result does not preclude SARS-Cov-2 infection and should not be used as the sole basis for treatment or other patient management decisions. A negative result may occur with  improper specimen collection/handling, submission of specimen other than nasopharyngeal swab, presence of viral mutation(s) within the areas targeted by this assay, and inadequate number of viral copies(<138 copies/mL). A negative result must be combined with clinical observations, patient history, and epidemiological information. The expected result is Negative.  Fact Sheet for Patients:  EntrepreneurPulse.com.au  Fact Sheet for Healthcare Providers:   IncredibleEmployment.be  This test is no t yet approved or cleared by the [place] FDA and  has been authorized for detection and/or diagnosis of SARS-CoV-2 by FDA under an Emergency Use Authorization (EUA). This EUA will remain  in effect (meaning this test can be used) for the duration of the COVID-19 declaration under Section 564(b)(1) of the Act, 21 U.S.C.section 360bbb-3(b)(1), unless the authorization is terminated  or revoked sooner.       Influenza A by PCR NEGATIVE NEGATIVE Final   Influenza B by PCR NEGATIVE NEGATIVE Final    Comment: (NOTE) The Xpert Xpress SARS-CoV-2/FLU/RSV plus assay is intended as an aid in the diagnosis of influenza from Nasopharyngeal swab specimens and should not be used as a sole basis for treatment. Nasal washings and aspirates are unacceptable for Xpert Xpress SARS-CoV-2/FLU/RSV testing.  Fact Sheet for Patients: EntrepreneurPulse.com.au  Fact Sheet for Healthcare Providers: IncredibleEmployment.be  This test is not yet approved or cleared by the [place] FDA and has been authorized for detection and/or diagnosis of SARS-CoV-2 by FDA under an Emergency Use Authorization (EUA). This EUA will remain in effect (meaning this test can be used) for the duration of the COVID-19 declaration under Section 564(b)(1) of the Act, 21 U.S.C. section 360bbb-3(b)(1), unless the authorization is terminated or revoked.  Performed at [facility], [address] [place], [place] [address]   Culture, blood (routine x 2)     Status: None   Collection Time: 03/06/21  4:11 PM   Specimen: BLOOD  Result Value Ref Range Status   Specimen Description BLOOD RIGHT ANTECUBITAL  Final   Special Requests   Final    BOTTLES DRAWN AEROBIC AND ANAEROBIC Blood Culture results may not be optimal due to an excessive volume of blood received in culture bottles   Culture   Final    NO GROWTH 5  DAYS Performed at [facility], [address] [place], [place] [address]    Report Status 03/11/2021 FINAL  Final  Culture, blood (routine x 2)     Status: None   Collection Time: 03/06/21  4:11 PM   Specimen: BLOOD  Result Value Ref Range Status   Specimen Description BLOOD BLOOD LEFT ARM  Final   Special Requests   Final    BOTTLES DRAWN AEROBIC AND ANAEROBIC Blood Culture results may not be optimal due to an excessive volume of blood received in culture bottles   Culture   Final    NO GROWTH 5 DAYS Performed at [facility], [address], [place], [place] [address]    Report Status 03/11/2021 FINAL  Final  MRSA PCR Screening     Status: None   Collection Time: 03/06/21 11:43 PM  Result Value Ref Range Status   MRSA  by PCR NEGATIVE NEGATIVE Final    Comment:        The GeneXpert MRSA Assay (FDA approved for NASAL specimens only), is one component of a comprehensive MRSA colonization surveillance program. It is not intended to diagnose MRSA infection nor to guide or monitor treatment for MRSA infections. Performed at [facility], [address] [place], [place] [address]   Body fluid culture w Gram Stain     Status: None (Preliminary result)   Collection Time: 03/09/21 11:30 AM   Specimen: Peritoneal Dialysate; Body Fluid  Result Value Ref Range Status   Specimen Description   Final    PERITONEAL DIALYSATE Performed at [facility], [address] [place], [place] [address]    Special Requests   Final    NONE Performed at [facility], [address] [place], [place] [address]    Gram Stain   Final    RARE WBC PRESENT, PREDOMINANTLY MONONUCLEAR NO ORGANISMS SEEN    Culture   Final    NO GROWTH 2 DAYS Performed at [facility], [address], [place], [place] [address]    Report Status PENDING  Incomplete    Coagulation Studies: No results for input(s): LABPROT, INR in the last 72  hours.   Urinalysis: No results for input(s): COLORURINE, LABSPEC, PHURINE, GLUCOSEU, HGBUR, BILIRUBINUR, KETONESUR, PROTEINUR, UROBILINOGEN, NITRITE, LEUKOCYTESUR in the last 72 hours.  Invalid input(s): APPERANCEUR    Imaging: No results found.   Medications:    sodium chloride 10 mL/hr at 03/07/21 1429   dialysis solution 1.5% low-MG/low-CA     promethazine (PHENERGAN) injection (IM or IVPB) 12.5 mg (03/11/21 1820)    amLODipine  10 mg Oral Daily   calcium acetate  1,334 mg Oral TID with meals   ferrous sulfate  325 mg Oral TID WC   gentamicin cream  1 application Topical Daily   gentamicin cream  1 application Topical Daily   hydrALAZINE  50 mg Oral Q8H   metoprolol succinate  100 mg Oral Daily   nicotine  14 mg Transdermal Daily   pantoprazole  40 mg Oral BID   potassium chloride  40 mEq Oral Q2H   saccharomyces boulardii  250 mg Oral BID   sertraline  25 mg Oral Daily     Assessment/ Plan:  Larry [name] is a 55 y.o. white male with end stage renal disease on peritoneal dialysis, hypertension, atrial fibrillation, GERD, depression, who is admitted to [facility] on 03/06/2021 for Hyperkalemia [E87.5] Uremia [N19] ESRD on peritoneal dialysis ([place]) [N18.6, Z99.2] HCAP (healthcare-associated pneumonia) [J18.9]  [facility] Larry [name]  Larry [name]: 4 cycles, 2 liter fills, fil time 32mn, drain time 164m  EDW of 65.5kg  End Stage Renal Disease on PD: with hyperkalemia (now with hypokalemia) Patient with peritoneal dialysis prescription nonadherence.  Often cutting treatments short as well as skipping some treatments and being in a swimming pool (not allowed on PD).  He also had uremic pericarditis recently.  Cell count not consistent with peritonitis.  - Continue peritoneal dialysis for tonight. 1.5% dextrose - potassium now hypokalemic.     Lab Results  Component Value Date   CREATININE 16.11 (H) 03/12/2021   CREATININE 17.44 (H) 03/11/2021   CREATININE 20.83 (H)  03/10/2021    Intake/Output Summary (Last 24 hours) at 03/12/2021 0954 Last data filed at 03/11/2021 2026 Gross per 24 hour  Intake 120 ml  Output 150 ml  Net -30 ml    2.  Hypokalemia: secondary to peritoneal dialysis - PO potassium chloride - he has history of hyperkalemia so ARB and potassium supplements have been held in past.  - Will need to take potassium chloride as outpatient: 20 mEq. Hesitant to give more as he had hyperkalemia on admission.  - start losartan  3. Anemia of chronic kidney disease: hemoglobin 7.4. normocytic. EPO as outpatient.   4. Hypertension: elevated not at goal. Continue amlodipine, hydralazine, metoprolol.  - start losartan as above.   5. Secondary Hyperparathyroidism: with hyperphosphatemia and hypocalcemia.  - continue calcium acetate with meals.    LOS: 6 Camden Mazzaferro 6/21/20229:54 AM

## 2021-03-13 LAB — BODY FLUID CULTURE W GRAM STAIN: Culture: NO GROWTH

## 2021-03-13 NOTE — Progress Notes (Signed)
BP (!) 165/85   Pulse 79   Temp 98.3 F (36.8 C)   Ht 5' 5.16" (1.655 m)   Wt 138 lb 2 oz (62.7 kg)   SpO2 97%   BMI 22.87 kg/m    Subjective:    Patient ID: Larry [name], male    DOB: 1965/10/15, 55 y.o.   MRN: [id number]  HPI: Larry [name] is a 55 y.o. male  Chief Complaint  Patient presents with   URI   Hypertension   Patient here to establish care today.  Reports a history that includes ESRD-peritoneal dialysis, nephrotic syndrome, HTN, GERD, depression, anemia, PAF not on anticoagulants, who presents with productive cough, coffee-ground emesis and weakness.  He was recently hospitalized due to upper respiratory virus.  He has been on peritoneal dialysis for several years. Patient sees Dr. Holley Raring for nephrology.    Patient states that he wsa put into Afib during a catheterization.  He has not has any problems since then.  Does not currently have a Cardiologist. Not currently on anticoagulation due to coffee ground emesis and likely esophagitis. Patient will need to follow up with GI.  Patient states he is still having some fatigue, cough and congestion.   Patient states he had some depression many years ago.  He has been on Sertraline for a long time and it works well for him.    Relevant past medical, surgical, family and social history reviewed and updated as indicated. Interim medical history since our last visit reviewed. Allergies and medications reviewed and updated.  Review of Systems  Constitutional:  Positive for fatigue.  HENT:  Positive for congestion.   Respiratory:  Positive for cough.    Per HPI unless specifically indicated above     Objective:    BP (!) 165/85   Pulse 79   Temp 98.3 F (36.8 C)   Ht 5' 5.16" (1.655 m)   Wt 138 lb 2 oz (62.7 kg)   SpO2 97%   BMI 22.87 kg/m   Wt Readings from Last 3 Encounters:  03/14/21 138 lb 2 oz (62.7 kg)  03/09/21 144 lb 6.4 oz (65.5 kg)  01/16/21 144 lb 2 oz (65.4 kg)    Physical Exam Vitals  and nursing note reviewed.  Constitutional:      General: He is not in acute distress.    Appearance: Normal appearance. He is not ill-appearing, toxic-appearing or diaphoretic.  HENT:     Head: Normocephalic.     Right Ear: External ear normal.     Left Ear: External ear normal.     Nose: Nose normal. No congestion or rhinorrhea.     Mouth/Throat:     Mouth: Mucous membranes are moist.  Eyes:     General:        Right eye: No discharge.        Left eye: No discharge.     Extraocular Movements: Extraocular movements intact.     Conjunctiva/sclera: Conjunctivae normal.     Pupils: Pupils are equal, round, and reactive to light.  Cardiovascular:     Rate and Rhythm: Normal rate and regular rhythm.     Heart sounds: No murmur heard. Pulmonary:     Effort: Pulmonary effort is normal. No respiratory distress.     Breath sounds: Wheezing present. No rhonchi or rales.  Abdominal:     General: Abdomen is flat. Bowel sounds are normal.  Musculoskeletal:     Cervical back: Normal range of motion and  neck supple.  Skin:    General: Skin is warm and dry.     Capillary Refill: Capillary refill takes less than 2 seconds.  Neurological:     General: No focal deficit present.     Mental Status: He is alert and oriented to person, place, and time.  Psychiatric:        Mood and Affect: Mood normal.        Behavior: Behavior normal.        Thought Content: Thought content normal.        Judgment: Judgment normal.     Assessment & Plan:   Problem List Items Addressed This Visit       Cardiovascular and Mediastinum   HTN (hypertension)    Chronic.  Not well controlled.  Will follow up with patient next week. If blood pressure is still elevated will likely increase Hydralazine.  Return in 1 week for HFU.         Genitourinary   Anemia in ESRD (end-stage renal disease) (HCC)    Chronic. Will draw labs next week to evaluate anemia and follow up on Potassium.          Other    ESRD on peritoneal dialysis [facility]) - Primary    Continue to follow up with Nephrology.        Hyperkalemia    Chronic. Will draw labs next week to evaluate anemia and follow up on Potassium.        Other Visit Diagnoses     Encounter to establish care       Return next week for HFU.        Follow up plan: Return in about 4 days (around 03/18/2021) for HFU.   A total of 40 minutes were spent on this encounter today.  When total time is documented, this includes both the face-to-face and non-face-to-face time personally spent before, during and after the visit on the date of the encounter.

## 2021-03-13 NOTE — Discharge Summary (Signed)
[facility] Discharge Summary   Patient: Larry [name] [id number]  PCP: Margo Common, PA-C  Date of admission: 03/06/2021   Date of discharge: 03/12/2021      Discharge Diagnoses:  Principal Problem:   Uremia Active Problems:   ESRD on peritoneal dialysis ([place])   HTN (hypertension)   Nicotine dependence   Paroxysmal atrial fibrillation (HCC)   Hyperkalemia   Coffee ground emesis   Sepsis ([place])   HCAP (healthcare-associated pneumonia)   Anemia in ESRD (end-stage renal disease) ([place])   GERD (gastroesophageal reflux disease)   Admitted From: home Disposition:  Home   Recommendations for Outpatient Follow-up:  PCP: follow up with PCP and Nephrology  Follow up LABS/TEST:  bmp   Follow-up Information     Chrismon, Vickki Muff, PA-C. Schedule an appointment as soon as possible for a visit on 03/19/2021.   Specialty: Family Medicine Why: Appointment at 9:40am Contact information: [place] [address] [phone]         [name], MD. Schedule an appointment as soon as possible for a visit on 04/08/2021.   Specialty: Nephrology Why: Appointment at 2:40pm Contact information: [address] [place] [address] [phone]         Efrain Sella, MD Follow up on 05/14/2021.   Specialty: Gastroenterology Why: Appointment at 3:30pm Contact information: [place] [address] [phone]                Discharge Instructions     Diet - low sodium heart healthy   Complete by: As directed    Increase activity slowly   Complete by: As directed    No wound care   Complete by: As directed        Diet recommendation: Regular diet  Activity: The patient is advised to gradually reintroduce usual activities, as tolerated  Discharge Condition: stable  Code Status: Full code   History of present illness: As per the H and P dictated on admission, "Larry [name] is a 55  y.o. male with medical history significant of ESRD-peritoneal dialysis, nephrotic syndrome, HTN, GERD, depression, anemia, PAF not on anticoagulants, who presents with productive cough, coffee-ground emesis and weakness.   Patient states that he has been sick for several days. He has cough with thick yellow-colored sputum production.  Patient is taking Z-Pak for URI currently.  Denies shortness of breath or chest pain.  No subjective fever or chills.  Patient has generalized weakness, fatigue, malaise, body aches.  He has nausea and vomited coffee-ground material.  Patient had episode of loose stool bowel movement.  He had mild abdominal pain earlier, which has resolved.  Currently patient does not have abdominal pain or diarrhea.  No symptoms of UTI.  Patient states that he is doing nightly peritoneal dialysis at home.   ED Course: pt was found to have potassium 7.3, WBC 15.2, lactic acid of 0.9, negative COVID PCR, hemoglobin 8.5 (9.1 on 12/06/2020), bicarbonate<7.0, creatinine 34, BUN 149, blood pressure 153/70, heart rate 89, RR 22, temperature 97.9, oxygen saturation 96% on room air.  Patient is admitted to progressive bed as inpatient.  Dr. Holley Raring of renal and Dr. Alice Reichert of GI are consulted."  Hospital Course:  Summary of his active problems in the hospital is as following.   1.  ESRD on PD. Uremia and hyperkalemia Noncompliance with PD therapy. Hypokalemia  On presentation potassium was 7. BUN serum creatinine still significantly elevated. Potassium actually now  low. Appreciate nephrology assistance. Pt had PD here in the hospital until clearance improved. Potassium replaced. Regular Diet per Nephrology  Family reports developing A. fib after HD last time when he was in the hospital. Explained that there is always a possibility but clearance is important.   2.  Sepsis secondary to healthcare associated pneumonia With leukocytosis, tachycardia and tachypnea met SIRS criteria on  admission. Chest x-ray shows possible infiltration. Was on Rocephin and azithromycin, switched to oral Antibiotics  Only antibiotics for 5 days. Last day 6/19. Cultures so far negative. MRSA PCR negative.   3.  HTN Continuing current regimen.   4.  Paroxysmal A. fib Not on any medication for anticoagulation. On Lopressor. Currently in the setting of concern with coffee-ground emesis on presentation with anemia holding off on further discussion about anticoagulation.   5.  Coffee-ground emesis Possible eosinophilic esophagitis Hemoglobin currently remaining stable. EGD on 6/16 shows grade C esophagitis with possible eosinophilic esophagitis. Recommendation is for continuing PPI and follow-up in 6 weeks. Diet advance.    6.  Diarrhea Intermittent in nature. Added probiotics, diarrhea likely from antibiotics. Resolved.  Patient does not have any abdominal tenderness on examination nor he has any leukocytosis.  Less likely infectious in nature.  PT was consulted but never saw the pt. Pt was ambulatory and independent per nursing.  On the day of the discharge the patient's vitals were stable, and no other new acute medical condition were reported. The patient was felt safe to be discharge at Home.  Consultants: Nephrology  Procedures: PD  DISCHARGE MEDICATION: Allergies as of 03/12/2021       Reactions   Mushroom Extract Complex    Headache, migraines   Amoxicillin Nausea And Vomiting   Weakness  Has patient had a PCN reaction causing immediate rash, facial/tongue/throat swelling, SOB or lightheadedness with hypotension: No Has patient had a PCN reaction causing severe rash involving mucus membranes or skin necrosis: No Has patient had a PCN reaction that required hospitalization: No Has patient had a PCN reaction occurring within the last 10 years: No If all of the above answers are "NO", then may proceed with Cephalosporin use.        Medication List     STOP taking  these medications    ibuprofen 800 MG tablet Commonly known as: ADVIL       TAKE these medications    acetaminophen 500 MG tablet Commonly known as: TYLENOL Take 500-1,000 mg by mouth every 6 (six) hours as needed for mild pain or moderate pain.   amLODipine 10 MG tablet Commonly known as: NORVASC Take 1 tablet (10 mg total) by mouth daily. What changed: how much to take   calcium acetate 667 MG capsule Commonly known as: PHOSLO Take 1,334 mg by mouth 3 (three) times daily.   famotidine 20 MG tablet Commonly known as: PEPCID Take 20 mg by mouth 2 (two) times daily as needed for heartburn or indigestion.   ferrous sulfate 325 (65 FE) MG tablet Take 1 tablet (325 mg total) by mouth 3 (three) times daily with meals.   hydrALAZINE 50 MG tablet Commonly known as: APRESOLINE Take 50 mg by mouth daily as needed (If diastolic bp is 90 or above).   losartan 50 MG tablet Commonly known as: COZAAR Take 2 tablets (100 mg total) by mouth daily.   metoprolol succinate 100 MG 24 hr tablet Commonly known as: TOPROL-XL Take 1 tablet (100 mg total) by mouth daily.   nicotine 14  mg/24hr patch Commonly known as: NICODERM CQ - dosed in mg/24 hours Place 14 mg onto the skin daily.   ondansetron 4 MG tablet Commonly known as: Zofran Take 1 tablet (4 mg total) by mouth daily as needed for nausea or vomiting.   pantoprazole 40 MG tablet Commonly known as: PROTONIX Take 1 tablet (40 mg total) by mouth 2 (two) times daily.   potassium chloride SA 20 MEQ tablet Commonly known as: KLOR-CON Take 1 tablet (20 mEq total) by mouth daily for 3 days.   sertraline 25 MG tablet Commonly known as: ZOLOFT Take 25 mg by mouth daily.   sodium bicarbonate 650 MG tablet Take 1,300 mg by mouth 2 (two) times daily.        Discharge Exam: Filed Weights   03/07/21 0135 03/07/21 1324 03/09/21 0517  Weight: 69.5 kg 69.5 kg 65.5 kg   Vitals:   03/12/21 0412 03/12/21 0749  BP: (!) 192/73 (!)  167/131  Pulse: 84 87  Resp: 19   Temp: 98.7 F (37.1 C) 99 F (37.2 C)  SpO2: 96% 92%   General: Appear in mild distress, no Rash; Oral Mucosa Clear, moist. no Abnormal Neck Mass Or lumps, Conjunctiva normal  Cardiovascular: S1 and S2 Present, no Murmur, Respiratory: good respiratory effort, Bilateral Air entry present and CTA, no Crackles, no wheezes Abdomen: Bowel Sound present, Soft and no tenderness Extremities: no Pedal edema Neurology: alert and oriented to time, place, and person affect appropriate. no new focal deficit Gait not checked due to patient safety concerns    The results of significant diagnostics from this hospitalization (including imaging, microbiology, ancillary and laboratory) are listed below for reference.    Significant Diagnostic Studies: DG Abdomen Acute W/Chest  Result Date: 03/06/2021 CLINICAL DATA:  Weakness EXAM: DG ABDOMEN ACUTE WITH 1 VIEW CHEST COMPARISON:  Chest x-ray 12/06/2020, abdominal radiograph 12/07/2020 FINDINGS: Single-view chest demonstrates cardiomegaly with vascular congestion. Diffuse bilateral interstitial opacity with patchy ground-glass opacity in the left thorax. No pneumothorax. Supine and upright views of the abdomen demonstrate no free air beneath the diaphragm. Surgical clips in the right upper quadrant. Nonobstructed gas pattern. Peritoneal dialysis catheter coiled over the left mid abdomen. No radiopaque calculi. IMPRESSION: 1. Cardiomegaly with vascular congestion and diffuse bilateral interstitial opacity probably due to pulmonary edema. Patchy ground-glass opacities in the left thorax may be due to slight asymmetric edema versus superimposed pneumonia 2. Nonobstructed gas pattern Electronically Signed   By: Donavan Foil M.D.   On: 03/06/2021 15:43    Microbiology: Recent Results (from the past 240 hour(s))  Resp Panel by RT-PCR (Flu A&B, Covid) Nasopharyngeal Swab     Status: None   Collection Time: 03/06/21  2:01 PM    Specimen: Nasopharyngeal Swab; Nasopharyngeal(NP) swabs in vial transport medium  Result Value Ref Range Status   SARS Coronavirus 2 by RT PCR NEGATIVE NEGATIVE Final    Comment: (NOTE) SARS-CoV-2 target nucleic acids are NOT DETECTED.  The SARS-CoV-2 RNA is generally detectable in upper respiratory specimens during the acute phase of infection. The lowest concentration of SARS-CoV-2 viral copies this assay can detect is 138 copies/mL. A negative result does not preclude SARS-Cov-2 infection and should not be used as the sole basis for treatment or other patient management decisions. A negative result may occur with  improper specimen collection/handling, submission of specimen other than nasopharyngeal swab, presence of viral mutation(s) within the areas targeted by this assay, and inadequate number of viral copies(<138 copies/mL). A negative result must  be combined with clinical observations, patient history, and epidemiological information. The expected result is Negative.  Fact Sheet for Patients:  EntrepreneurPulse.com.au  Fact Sheet for Healthcare Providers:  IncredibleEmployment.be  This test is no t yet approved or cleared by the [place] FDA and  has been authorized for detection and/or diagnosis of SARS-CoV-2 by FDA under an Emergency Use Authorization (EUA). This EUA will remain  in effect (meaning this test can be used) for the duration of the COVID-19 declaration under Section 564(b)(1) of the Act, 21 U.S.C.section 360bbb-3(b)(1), unless the authorization is terminated  or revoked sooner.       Influenza A by PCR NEGATIVE NEGATIVE Final   Influenza B by PCR NEGATIVE NEGATIVE Final    Comment: (NOTE) The Xpert Xpress SARS-CoV-2/FLU/RSV plus assay is intended as an aid in the diagnosis of influenza from Nasopharyngeal swab specimens and should not be used as a sole basis for treatment. Nasal washings and aspirates are  unacceptable for Xpert Xpress SARS-CoV-2/FLU/RSV testing.  Fact Sheet for Patients: EntrepreneurPulse.com.au  Fact Sheet for Healthcare Providers: IncredibleEmployment.be  This test is not yet approved or cleared by the [place] FDA and has been authorized for detection and/or diagnosis of SARS-CoV-2 by FDA under an Emergency Use Authorization (EUA). This EUA will remain in effect (meaning this test can be used) for the duration of the COVID-19 declaration under Section 564(b)(1) of the Act, 21 U.S.C. section 360bbb-3(b)(1), unless the authorization is terminated or revoked.  Performed at [facility], [address] [place], [place] [address]   Culture, blood (routine x 2)     Status: None   Collection Time: 03/06/21  4:11 PM   Specimen: BLOOD  Result Value Ref Range Status   Specimen Description BLOOD RIGHT ANTECUBITAL  Final   Special Requests   Final    BOTTLES DRAWN AEROBIC AND ANAEROBIC Blood Culture results may not be optimal due to an excessive volume of blood received in culture bottles   Culture   Final    NO GROWTH 5 DAYS Performed at [facility], [address] [place], [place] [address]    Report Status 03/11/2021 FINAL  Final  Culture, blood (routine x 2)     Status: None   Collection Time: 03/06/21  4:11 PM   Specimen: BLOOD  Result Value Ref Range Status   Specimen Description BLOOD BLOOD LEFT ARM  Final   Special Requests   Final    BOTTLES DRAWN AEROBIC AND ANAEROBIC Blood Culture results may not be optimal due to an excessive volume of blood received in culture bottles   Culture   Final    NO GROWTH 5 DAYS Performed at [facility], [address] [place], [place] [address]    Report Status 03/11/2021 FINAL  Final  MRSA PCR Screening     Status: None   Collection Time: 03/06/21 11:43 PM  Result Value Ref Range Status   MRSA by PCR NEGATIVE NEGATIVE Final    Comment:        The  GeneXpert MRSA Assay (FDA approved for NASAL specimens only), is one component of a comprehensive MRSA colonization surveillance program. It is not intended to diagnose MRSA infection nor to guide or monitor treatment for MRSA infections. Performed at [facility], [address] [place], [place] [address]   Body fluid culture w Gram Stain     Status: None   Collection Time: 03/09/21 11:30 AM   Specimen: Peritoneal Dialysate; Body Fluid  Result Value Ref Range Status   Specimen Description   Final    PERITONEAL DIALYSATE Performed at [facility], [address] [place], [place] [address]    Special Requests   Final    NONE Performed at [facility], [address] [place], [place] [address]    Gram Stain   Final    RARE WBC PRESENT, PREDOMINANTLY MONONUCLEAR NO ORGANISMS SEEN    Culture   Final    NO GROWTH 3 DAYS Performed at [facility] [address], [place], [place] [address]    Report Status 03/13/2021 FINAL  Final    Labs: CBC: Recent Labs  Lab 03/08/21 0531 03/09/21 1019 03/10/21 0623 03/11/21 0545 03/12/21 0544  WBC 7.8 9.9 9.5 8.9 7.8  HGB 7.4* 7.5* 7.5* 7.6* 7.4*  HCT 21.1* 21.8* 22.3* 22.7* 22.3*  MCV 89.4 89.7 91.0 91.9 91.8  PLT 153 129* 129* 122* 040*   Basic Metabolic Panel: Recent Labs  Lab 03/08/21 1420 03/09/21 1019 03/10/21 0623 03/11/21 0545 03/12/21 0544  NA 139 136 136 134* 135  K 3.2* 2.7* 3.0* 2.7* 2.4*  CL 102 99 99 97* 97*  CO2 16* 19* 19* 23 24  GLUCOSE 132* 106* 114* 129* 104*  BUN 154* 132* 130* 100* 81*  CREATININE 24.73* 21.05* 20.83* 17.44* 16.11*  CALCIUM 7.5* 7.0* 7.1* 7.4* 7.2*  PHOS 11.7* 10.3* 9.2* 6.5* 6.4*   Liver Function Tests: Recent Labs  Lab 03/08/21 1420 03/09/21 1019 03/10/21 0623 03/11/21 0545 03/12/21 0544  ALBUMIN 2.7* 2.7* 2.7* 2.7* 2.7*   CBG: Recent Labs  Lab 03/10/21 1139 03/11/21 0754 03/11/21 1159 03/11/21 1743 03/12/21 0747  GLUCAP 113*  90 101* 143* 130*    Time spent: 35 minutes  Signed:  Berle Mull  [facility] 03/12/2021 4:19 PM

## 2021-03-14 ENCOUNTER — Encounter: Payer: Self-pay | Admitting: Nurse Practitioner

## 2021-03-14 ENCOUNTER — Ambulatory Visit (INDEPENDENT_AMBULATORY_CARE_PROVIDER_SITE_OTHER): Payer: Medicare Other | Admitting: Nurse Practitioner

## 2021-03-14 ENCOUNTER — Other Ambulatory Visit: Payer: Self-pay

## 2021-03-14 VITALS — BP 165/85 | HR 79 | Temp 98.3°F | Ht 65.16 in | Wt 138.1 lb

## 2021-03-14 DIAGNOSIS — Z7689 Persons encountering health services in other specified circumstances: Secondary | ICD-10-CM | POA: Diagnosis not present

## 2021-03-14 DIAGNOSIS — Z992 Dependence on renal dialysis: Secondary | ICD-10-CM | POA: Diagnosis not present

## 2021-03-14 DIAGNOSIS — I10 Essential (primary) hypertension: Secondary | ICD-10-CM | POA: Diagnosis not present

## 2021-03-14 DIAGNOSIS — E875 Hyperkalemia: Secondary | ICD-10-CM

## 2021-03-14 DIAGNOSIS — N186 End stage renal disease: Secondary | ICD-10-CM

## 2021-03-14 DIAGNOSIS — D631 Anemia in chronic kidney disease: Secondary | ICD-10-CM | POA: Diagnosis not present

## 2021-03-14 NOTE — Assessment & Plan Note (Signed)
Chronic.  Not well controlled.  Will follow up with patient next week. If blood pressure is still elevated will likely increase Hydralazine.  Return in 1 week for HFU.

## 2021-03-14 NOTE — Assessment & Plan Note (Signed)
Chronic. Will draw labs next week to evaluate anemia and follow up on Potassium.

## 2021-03-14 NOTE — Assessment & Plan Note (Signed)
Continue to follow up with Nephrology.

## 2021-03-18 ENCOUNTER — Encounter: Payer: Self-pay | Admitting: Nurse Practitioner

## 2021-03-18 ENCOUNTER — Other Ambulatory Visit: Payer: Self-pay

## 2021-03-18 ENCOUNTER — Ambulatory Visit (INDEPENDENT_AMBULATORY_CARE_PROVIDER_SITE_OTHER): Payer: Medicare Other | Admitting: Nurse Practitioner

## 2021-03-18 VITALS — BP 152/79 | HR 86 | Temp 97.6°F | Wt 141.0 lb

## 2021-03-18 DIAGNOSIS — I10 Essential (primary) hypertension: Secondary | ICD-10-CM

## 2021-03-18 DIAGNOSIS — Z09 Encounter for follow-up examination after completed treatment for conditions other than malignant neoplasm: Secondary | ICD-10-CM

## 2021-03-18 DIAGNOSIS — I480 Paroxysmal atrial fibrillation: Secondary | ICD-10-CM | POA: Diagnosis not present

## 2021-03-18 DIAGNOSIS — K920 Hematemesis: Secondary | ICD-10-CM | POA: Diagnosis not present

## 2021-03-18 DIAGNOSIS — J189 Pneumonia, unspecified organism: Secondary | ICD-10-CM

## 2021-03-18 DIAGNOSIS — N186 End stage renal disease: Secondary | ICD-10-CM

## 2021-03-18 DIAGNOSIS — Z992 Dependence on renal dialysis: Secondary | ICD-10-CM

## 2021-03-18 MED ORDER — METOPROLOL SUCCINATE ER 100 MG PO TB24: 100.0000 mg | ORAL_TABLET | Freq: Every day | ORAL | 1 refills | Status: DC

## 2021-03-18 MED ORDER — BENZONATATE 200 MG PO CAPS: 200.0000 mg | ORAL_CAPSULE | Freq: Three times a day (TID) | ORAL | 0 refills | Status: DC | PRN

## 2021-03-18 NOTE — Assessment & Plan Note (Addendum)
Chronic.  Controlled.  Continue with current medication regimen.  Labs ordered today.  Return to clinic in 6 months for reevaluation.  Call sooner if concerns arise. Refills sent today.

## 2021-03-18 NOTE — Assessment & Plan Note (Addendum)
Discussed the importance of follow up with GI. Patient has not scheduled an appointment.  Referral placed for patient to aid in getting GI appointment.

## 2021-03-18 NOTE — Assessment & Plan Note (Signed)
Resolving. Antibiotics complete.  Patient states symptoms are improving overall.  Return to clinic if symptoms worsen or fail to improve.

## 2021-03-18 NOTE — Progress Notes (Signed)
BP (!) 152/79 (BP Location: Right Arm, Cuff Size: Normal)   Pulse 86   Temp 97.6 F (36.4 C) (Oral)   Wt 141 lb (64 kg)   SpO2 96%   BMI 23.35 kg/m    Subjective:    Patient ID: Larry [name], male    DOB: [birth year], 55 y.o.   MRN: [id number]  HPI: Larry [name] is a 55 y.o. male  Chief Complaint  Patient presents with   Hospitalization Follow-up   Patient presents to clinic today for [place].  Patient reports doing well.  He continues to have some fatigue but states it is improving.   1.  ESRD on PD.- CMP checked in office today.  Uremia and hyperkalemia Noncompliance with PD therapy. Hypokalemia    2.  Sepsis secondary to healthcare associated pneumonia- antibiotics completed. SOB resolved. Some residual fatigue.   3.  HTN- Controlled on current regimen.   4.  Paroxysmal A. Fib- Will hold off on anticoagulation at this time due to coffee ground emesis. Patient states Afib was caused when he had HD during a previous hospitalization. Has not had any problems since.   5.  Coffee-ground emesis- Continue PPI. CBC checked today. Follow up with GI. Possible eosinophilic esophagitis    Transition of Care Hospital Follow up.   Hospital/Facility: [facility] D/C Physician: Dr. Murlean Iba D/C Date: 03/12/2021  Records Requested: none Records Received: none Records Reviewed: Reviewed results in EPIC  Diagnoses on Discharge:  1.  ESRD on PD. Uremia and hyperkalemia Noncompliance with PD therapy. Hypokalemia     2.  Sepsis secondary to healthcare associated pneumonia   3.  HTN   4.  Paroxysmal A. fib   5.  Coffee-ground emesis Possible eosinophilic esophagitis  Date of interactive Contact within 48 hours of discharge: none Contact was through: direct  Date of 7 day or 14 day face-to-face visit:    within 7 days  Outpatient Encounter Medications as of 03/18/2021  Medication Sig Note   acetaminophen (TYLENOL) 500 MG tablet Take 500-1,000 mg by mouth every 6 (six)  hours as needed for mild pain or moderate pain.    amLODipine (NORVASC) 10 MG tablet Take 1 tablet (10 mg total) by mouth daily.    benzonatate (TESSALON) 200 MG capsule Take 1 capsule (200 mg total) by mouth 3 (three) times daily as needed for cough.    calcium acetate (PHOSLO) 667 MG capsule Take 1,334 mg by mouth 3 (three) times daily.    famotidine (PEPCID) 20 MG tablet Take 20 mg by mouth 2 (two) times daily as needed for heartburn or indigestion.    ferrous sulfate 325 (65 FE) MG tablet Take 1 tablet (325 mg total) by mouth 3 (three) times daily with meals.    hydrALAZINE (APRESOLINE) 50 MG tablet Take 50 mg by mouth daily as needed (If diastolic bp is 90 or above).     losartan (COZAAR) 50 MG tablet Take 2 tablets (100 mg total) by mouth daily.    ondansetron (ZOFRAN) 4 MG tablet Take 1 tablet (4 mg total) by mouth daily as needed for nausea or vomiting.    pantoprazole (PROTONIX) 40 MG tablet Take 1 tablet (40 mg total) by mouth 2 (two) times daily.    sertraline (ZOLOFT) 25 MG tablet Take 25 mg by mouth daily.     sodium bicarbonate 650 MG tablet Take 1,300 mg by mouth 2 (two) times daily.     [DISCONTINUED] metoprolol succinate (TOPROL-XL) 100 MG 24  hr tablet Take 1 tablet (100 mg total) by mouth daily. 03/06/2021: Patient has old (1+ year) bottle. This 01/16/2021 RX not dispensed, per pharmacy records   metoprolol succinate (TOPROL-XL) 100 MG 24 hr tablet Take 1 tablet (100 mg total) by mouth daily.    potassium chloride SA (KLOR-CON) 20 MEQ tablet Take 1 tablet (20 mEq total) by mouth daily for 3 days.    No facility-administered encounter medications on file as of 03/18/2021.    Diagnostic Tests Reviewed/Disposition: Reviewed  Consults: Gastroenterology and Nephrology  Discharge Instructions: Given to patient  Disease/illness Education: provided  Home Health/Community Services Discussions/Referrals: none needed at this time  Establishment or re-establishment of referral orders  for community resources: none needed at this time  Discussion with other health care providers: Patient has follow up with Nephrology scheduled.   Assessment and Support of treatment regimen adherence: taking medications as prescribed  Appointments Coordinated with: Nephrology and GI  Education for self-management, independent living, and ADLs: Able to complete himself.  Relevant past medical, surgical, family and social history reviewed and updated as indicated. Interim medical history since our last visit reviewed. Allergies and medications reviewed and updated.  Review of Systems  Constitutional:  Positive for fatigue.  Eyes:  Negative for visual disturbance.  Respiratory:  Positive for cough. Negative for shortness of breath.   Cardiovascular:  Negative for chest pain and leg swelling.  Neurological:  Negative for light-headedness and headaches.   Per HPI unless specifically indicated above     Objective:    BP (!) 152/79 (BP Location: Right Arm, Cuff Size: Normal)   Pulse 86   Temp 97.6 F (36.4 C) (Oral)   Wt 141 lb (64 kg)   SpO2 96%   BMI 23.35 kg/m   Wt Readings from Last 3 Encounters:  03/18/21 141 lb (64 kg)  03/14/21 138 lb 2 oz (62.7 kg)  03/09/21 144 lb 6.4 oz (65.5 kg)    Physical Exam Vitals and nursing note reviewed.  Constitutional:      General: He is not in acute distress.    Appearance: Normal appearance. He is not ill-appearing, toxic-appearing or diaphoretic.  HENT:     Head: Normocephalic.     Right Ear: External ear normal.     Left Ear: External ear normal.     Nose: Nose normal. No congestion or rhinorrhea.     Mouth/Throat:     Mouth: Mucous membranes are moist.  Eyes:     General:        Right eye: No discharge.        Left eye: No discharge.     Extraocular Movements: Extraocular movements intact.     Conjunctiva/sclera: Conjunctivae normal.     Pupils: Pupils are equal, round, and reactive to light.  Cardiovascular:     Rate and  Rhythm: Normal rate and regular rhythm.     Heart sounds: No murmur heard. Pulmonary:     Effort: Pulmonary effort is normal. No respiratory distress.     Breath sounds: Normal breath sounds. No wheezing, rhonchi or rales.  Abdominal:     General: Abdomen is flat. Bowel sounds are normal.  Musculoskeletal:     Cervical back: Normal range of motion and neck supple.  Skin:    General: Skin is warm and dry.     Capillary Refill: Capillary refill takes less than 2 seconds.  Neurological:     General: No focal deficit present.     Mental Status: He  is alert and oriented to person, place, and time.  Psychiatric:        Mood and Affect: Mood normal.        Behavior: Behavior normal.        Thought Content: Thought content normal.        Judgment: Judgment normal.       Assessment & Plan:   Problem List Items Addressed This Visit       Cardiovascular and Mediastinum   HTN (hypertension)    Chronic.  Controlled.  Continue with current medication regimen.  Labs ordered today.  Return to clinic in 6 months for reevaluation.  Call sooner if concerns arise. Refills sent today.         Relevant Medications   metoprolol succinate (TOPROL-XL) 100 MG 24 hr tablet   Other Relevant Orders   Comp Met (CMET)   CBC w/Diff   Paroxysmal atrial fibrillation (HCC)    Rate is controlled without medication. Not currently on anticoagulation. Will reevaluate after patient see's GI. Will likely recommend patient see Cardiology in the future.       Relevant Medications   metoprolol succinate (TOPROL-XL) 100 MG 24 hr tablet     Respiratory   HCAP (healthcare-associated pneumonia)    Resolving. Antibiotics complete.  Patient states symptoms are improving overall.  Return to clinic if symptoms worsen or fail to improve.       Relevant Medications   benzonatate (TESSALON) 200 MG capsule     Digestive   Coffee ground emesis    Discussed the importance of follow up with GI. Patient has not  scheduled an appointment.  Referral placed for patient to aid in getting GI appointment.        Relevant Orders   Ambulatory referral to Gastroenterology     Other   ESRD on peritoneal dialysis ([place])    Chronic.  Labs ordered today.  Follow up per Nephrology's recommendations.  Patient has appointment scheduled for this month.       Relevant Orders   Comp Met (CMET)   CBC w/Diff   Other Visit Diagnoses     Hospital discharge follow-up    -  Primary   Discussed the importance of follow up with GI and Nephrology. Labs ordered today. will make further recommendaitons based on lab results.    Relevant Orders   Comp Met (CMET)   CBC w/Diff        Follow up plan: Return in about 3 months (around 06/18/2021) for HTN, HLD, DM2 FU.

## 2021-03-18 NOTE — Assessment & Plan Note (Signed)
Rate is controlled without medication. Not currently on anticoagulation. Will reevaluate after patient see's GI. Will likely recommend patient see Cardiology in the future.

## 2021-03-18 NOTE — Assessment & Plan Note (Addendum)
Chronic.  Labs ordered today.  Follow up per Nephrology's recommendations.  Patient has appointment scheduled for this month.

## 2021-03-19 ENCOUNTER — Inpatient Hospital Stay: Payer: Self-pay | Admitting: Family Medicine

## 2021-03-19 LAB — CBC WITH DIFFERENTIAL/PLATELET
Basophils Absolute: 0.1 10*3/uL (ref 0.0–0.2)
Basos: 1 %
EOS (ABSOLUTE): 0.5 10*3/uL — ABNORMAL HIGH (ref 0.0–0.4)
Eos: 4 %
Hematocrit: 21.9 % — ABNORMAL LOW (ref 37.5–51.0)
Hemoglobin: 7.3 g/dL — ABNORMAL LOW (ref 13.0–17.7)
Immature Grans (Abs): 0.1 10*3/uL (ref 0.0–0.1)
Immature Granulocytes: 1 %
Lymphocytes Absolute: 2 10*3/uL (ref 0.7–3.1)
Lymphs: 19 %
MCH: 30.2 pg (ref 26.6–33.0)
MCHC: 33.3 g/dL (ref 31.5–35.7)
MCV: 91 fL (ref 79–97)
Monocytes Absolute: 0.8 10*3/uL (ref 0.1–0.9)
Monocytes: 8 %
Neutrophils Absolute: 7 10*3/uL (ref 1.4–7.0)
Neutrophils: 67 %
Platelets: 270 10*3/uL (ref 150–450)
RBC: 2.42 x10E6/uL — CL (ref 4.14–5.80)
RDW: 14 % (ref 11.6–15.4)
WBC: 10.3 10*3/uL (ref 3.4–10.8)

## 2021-03-19 LAB — COMPREHENSIVE METABOLIC PANEL
ALT: 17 IU/L (ref 0–44)
AST: 19 IU/L (ref 0–40)
Albumin/Globulin Ratio: 1.5 (ref 1.2–2.2)
Albumin: 3.1 g/dL — ABNORMAL LOW (ref 3.8–4.9)
Alkaline Phosphatase: 88 IU/L (ref 44–121)
BUN/Creatinine Ratio: 4 — ABNORMAL LOW (ref 9–20)
BUN: 59 mg/dL — ABNORMAL HIGH (ref 6–24)
Bilirubin Total: <0.2 mg/dL (ref 0.0–1.2)
CO2: 21 mmol/L (ref 20–29)
Calcium: 7 mg/dL — ABNORMAL LOW (ref 8.7–10.2)
Chloride: 98 mmol/L (ref 96–106)
Creatinine, Ser: 14.75 mg/dL — ABNORMAL HIGH (ref 0.76–1.27)
Globulin, Total: 2.1 g/dL (ref 1.5–4.5)
Glucose: 88 mg/dL (ref 65–99)
Potassium: 3.9 mmol/L (ref 3.5–5.2)
Sodium: 139 mmol/L (ref 134–144)
Total Protein: 5.2 g/dL — ABNORMAL LOW (ref 6.0–8.5)
eGFR: 4 mL/min/{1.73_m2} — ABNORMAL LOW (ref >59)

## 2021-03-19 NOTE — Progress Notes (Signed)
Please let patient know that his creatinine is still 14.75.  He is also anemic secondary to his chronic kidney disease. I recommend he follow up with Nephrology as soon as possible.

## 2021-04-21 DIAGNOSIS — N186 End stage renal disease: Secondary | ICD-10-CM | POA: Diagnosis not present

## 2021-04-21 DIAGNOSIS — Z992 Dependence on renal dialysis: Secondary | ICD-10-CM | POA: Diagnosis not present

## 2021-04-29 ENCOUNTER — Emergency Department: Payer: Medicare Other

## 2021-04-29 ENCOUNTER — Ambulatory Visit: Payer: Self-pay

## 2021-04-29 ENCOUNTER — Other Ambulatory Visit: Payer: Self-pay

## 2021-04-29 ENCOUNTER — Emergency Department
Admission: EM | Admit: 2021-04-29 | Discharge: 2021-04-29 | Disposition: A | Discharge status: Home / Self Care | Payer: Medicare Other | Attending: Emergency Medicine | Admitting: Emergency Medicine

## 2021-04-29 DIAGNOSIS — Z20822 Contact with and (suspected) exposure to covid-19: Secondary | ICD-10-CM | POA: Diagnosis not present

## 2021-04-29 DIAGNOSIS — D631 Anemia in chronic kidney disease: Secondary | ICD-10-CM | POA: Insufficient documentation

## 2021-04-29 DIAGNOSIS — R0602 Shortness of breath: Secondary | ICD-10-CM | POA: Diagnosis not present

## 2021-04-29 DIAGNOSIS — Z79899 Other long term (current) drug therapy: Secondary | ICD-10-CM | POA: Insufficient documentation

## 2021-04-29 DIAGNOSIS — R519 Headache, unspecified: Secondary | ICD-10-CM | POA: Diagnosis not present

## 2021-04-29 DIAGNOSIS — J810 Acute pulmonary edema: Secondary | ICD-10-CM | POA: Diagnosis not present

## 2021-04-29 DIAGNOSIS — I120 Hypertensive chronic kidney disease with stage 5 chronic kidney disease or end stage renal disease: Secondary | ICD-10-CM | POA: Insufficient documentation

## 2021-04-29 DIAGNOSIS — Z992 Dependence on renal dialysis: Secondary | ICD-10-CM | POA: Diagnosis not present

## 2021-04-29 DIAGNOSIS — F17210 Nicotine dependence, cigarettes, uncomplicated: Secondary | ICD-10-CM | POA: Insufficient documentation

## 2021-04-29 DIAGNOSIS — N186 End stage renal disease: Secondary | ICD-10-CM | POA: Diagnosis not present

## 2021-04-29 DIAGNOSIS — R69 Illness, unspecified: Secondary | ICD-10-CM | POA: Diagnosis not present

## 2021-04-29 DIAGNOSIS — R059 Cough, unspecified: Secondary | ICD-10-CM | POA: Diagnosis not present

## 2021-04-29 DIAGNOSIS — J90 Pleural effusion, not elsewhere classified: Secondary | ICD-10-CM | POA: Diagnosis not present

## 2021-04-29 LAB — TROPONIN I (HIGH SENSITIVITY)
Troponin I (High Sensitivity): 74 ng/L — ABNORMAL HIGH (ref <18)
Troponin I (High Sensitivity): 79 ng/L — ABNORMAL HIGH (ref <18)

## 2021-04-29 LAB — BASIC METABOLIC PANEL
Anion gap: 16 — ABNORMAL HIGH (ref 5–15)
BUN: 93 mg/dL — ABNORMAL HIGH (ref 6–20)
CO2: 15 mmol/L — ABNORMAL LOW (ref 22–32)
Calcium: 8.3 mg/dL — ABNORMAL LOW (ref 8.9–10.3)
Chloride: 106 mmol/L (ref 98–111)
Creatinine, Ser: 19.19 mg/dL — ABNORMAL HIGH (ref 0.61–1.24)
GFR, Estimated: 3 mL/min — ABNORMAL LOW (ref >60)
Glucose, Bld: 96 mg/dL (ref 70–99)
Potassium: 5.3 mmol/L — ABNORMAL HIGH (ref 3.5–5.1)
Sodium: 137 mmol/L (ref 135–145)

## 2021-04-29 LAB — CBC
HCT: 27.1 % — ABNORMAL LOW (ref 39.0–52.0)
Hemoglobin: 8.8 g/dL — ABNORMAL LOW (ref 13.0–17.0)
MCH: 30.2 pg (ref 26.0–34.0)
MCHC: 32.5 g/dL (ref 30.0–36.0)
MCV: 93.1 fL (ref 80.0–100.0)
Platelets: 216 10*3/uL (ref 150–400)
RBC: 2.91 MIL/uL — ABNORMAL LOW (ref 4.22–5.81)
RDW: 15.9 % — ABNORMAL HIGH (ref 11.5–15.5)
WBC: 12.8 10*3/uL — ABNORMAL HIGH (ref 4.0–10.5)
nRBC: 0 % (ref 0.0–0.2)

## 2021-04-29 LAB — RESP PANEL BY RT-PCR (FLU A&B, COVID) ARPGX2
Influenza A by PCR: NEGATIVE
Influenza B by PCR: NEGATIVE
SARS Coronavirus 2 by RT PCR: NEGATIVE

## 2021-04-29 MED ORDER — FUROSEMIDE 10 MG/ML IJ SOLN
60.0000 mg | Freq: Once | INTRAMUSCULAR | Status: AC
  Administered 2021-04-29: 60 mg via INTRAVENOUS
  Filled 2021-04-29: qty 8

## 2021-04-29 NOTE — ED Notes (Signed)
Pt not wanting to wait for MD to ok dc after elevated bp, pt states he will take another bp pill when he gets home. Pt walking to lobby, gait steady.

## 2021-04-29 NOTE — ED Triage Notes (Signed)
Pt arrives via POV with CC of SOB that began last night when pt was laying down in bed. Pt reports headache and cough that also began at the same time - pt took at home COVID test and it was negative. Upon collection of triage VS, pts BP is 211/128. Pt denies CP/dizziness/vision changes.

## 2021-04-29 NOTE — Discharge Instructions (Addendum)
Please seek medical attention for any high fevers, chest pain, shortness of breath, change in behavior, persistent vomiting, bloody stool or any other new or concerning symptoms.  

## 2021-04-29 NOTE — ED Provider Notes (Signed)
[facility] Emergency Department Provider Note   ____________________________________________   I have reviewed the triage vital signs and the nursing notes.   HISTORY  Chief Complaint Shortness of Breath   History limited by: Not limited   HPI Larry [name] is a 55 y.o. male who presents to the emergency department today because of concern for shortness of breath. The patient states that he first noticed it last night when he tried to go to bed. He said when he tried lying on his side he felt short of breath, was then able to get to a recliner where he felt better. Did not feel any shortness of breath with ambulation. The patient states that today he went to take a nap at around 1 pm and again felt short of breath. He denies any associated shortness of breath. Has felt like he has had some sinus drainage recently. Is on peritoneal dialysis and says he does it daily. Has not noticed any change in effluent. The patient denies any fevers. Denies any swelling. States he still makes urine.    Records reviewed. Per medical record review patient has a history of ESRD on peritoneal dialysis.   Past Medical History:  Diagnosis Date   Anemia    ESRD on peritoneal dialysis ([place])    Hypertension    Nephrotic syndrome     Patient Active Problem List   Diagnosis Date Noted   Uremia 03/06/2021   Hyperkalemia 03/06/2021   Coffee ground emesis 03/06/2021   Sepsis ([place]) 03/06/2021   HCAP (healthcare-associated pneumonia) 03/06/2021   Anemia in ESRD (end-stage renal disease) ([place]) 03/06/2021   GERD (gastroesophageal reflux disease) 03/06/2021   Pericarditis 12/09/2020   Uremia syndrome 12/09/2020   Paroxysmal atrial fibrillation (HCC)    HTN (hypertension) 12/06/2020   Nicotine dependence 12/06/2020   Intractable vomiting with nausea 12/06/2020   ESRD on peritoneal dialysis ([place]) 10/29/2020   Renal dialysis device, implant, or graft complication [id number]    Open displaced fracture of proximal phalanx of left thumb 02/29/2020   Anemia of chronic kidney failure, stage 5 ([place]) 12/28/2017   Hydronephrosis 10/15/2017   AKI (acute kidney injury) ([place]) 09/30/2017    Past Surgical History:  Procedure Laterality Date   CAPD INSERTION N/A 01/06/2018   Procedure: LAPAROSCOPIC INSERTION CONTINUOUS AMBULATORY PERITONEAL DIALYSIS  (CAPD) CATHETER;  Surgeon: Katha Cabal, MD;  Location: [facility] ORS;  Service: Vascular;  Laterality: N/A;   CHOLECYSTECTOMY     CYSTOSCOPY W/ URETERAL STENT REMOVAL Right 10/23/2017   Procedure: CYSTOSCOPY WITH STENT REPLACEMENT;  Surgeon: [name], MD;  Location: [facility] ORS;  Service: Urology;  Laterality: Right;   CYSTOSCOPY WITH STENT PLACEMENT Right 09/30/2017   Procedure: CYSTOSCOPY WITH STENT PLACEMENT;  Surgeon: [name], MD;  Location: [facility] ORS;  Service: Urology;  Laterality: Right;   CYSTOSCOPY/RETROGRADE/URETEROSCOPY Right 10/23/2017   Procedure: CYSTOSCOPY/RETROGRADE/URETEROSCOPY;  Surgeon: [name], MD;  Location: [facility] ORS;  Service: Urology;  Laterality: Right;   ESOPHAGOGASTRODUODENOSCOPY N/A 03/07/2021   Procedure: ESOPHAGOGASTRODUODENOSCOPY (EGD);  Surgeon: Tol[place], Benay Pike, MD;  Location: [facility] ENDOSCOPY;  Service: Gastroenterology;  Laterality: N/A;   RENAL BIOPSY     TEMPORARY DIALYSIS CATHETER N/A 12/07/2020   Procedure: TEMPORARY DIALYSIS CATHETER;  Surgeon: Katha Cabal, MD;  Location: [place] [facility];  Service: Cardiovascular;  Laterality: N/A;    Prior to Admission medications   Medication Sig Start Date End Date Taking? Authorizing Provider  acetaminophen (TYLENOL) 500 MG tablet Take 500-1,000 mg  by mouth every 6 (six) hours as needed for mild pain or moderate pain.    [provider]  amLODipine (NORVASC) 10 MG tablet Take 1 tablet (10 mg total) by mouth daily. 01/16/21   Minna Merritts, MD  benzonatate (TESSALON) 200 MG capsule Take 1 capsule (200 mg total) by  mouth 3 (three) times daily as needed for cough. 03/18/21   Jon Billings, NP  calcium acetate (PHOSLO) 667 MG capsule Take 1,334 mg by mouth 3 (three) times daily. 12/03/20   [provider]  famotidine (PEPCID) 20 MG tablet Take 20 mg by mouth 2 (two) times daily as needed for heartburn or indigestion.    [provider]  ferrous sulfate 325 (65 FE) MG tablet Take 1 tablet (325 mg total) by mouth 3 (three) times daily with meals. 03/12/21   Lavina Hamman, MD  hydrALAZINE (APRESOLINE) 50 MG tablet Take 50 mg by mouth daily as needed (If diastolic bp is 90 or above).     [provider]  losartan (COZAAR) 50 MG tablet Take 2 tablets (100 mg total) by mouth daily. 03/12/21   Lavina Hamman, MD  metoprolol succinate (TOPROL-XL) 100 MG 24 hr tablet Take 1 tablet (100 mg total) by mouth daily. 03/18/21   Jon Billings, NP  ondansetron (ZOFRAN) 4 MG tablet Take 1 tablet (4 mg total) by mouth daily as needed for nausea or vomiting. 03/12/21 03/12/22  Lavina Hamman, MD  pantoprazole (PROTONIX) 40 MG tablet Take 1 tablet (40 mg total) by mouth 2 (two) times daily. 03/12/21   Lavina Hamman, MD  potassium chloride SA (KLOR-CON) 20 MEQ tablet Take 1 tablet (20 mEq total) by mouth daily for 3 days. 03/12/21 03/15/21  Lavina Hamman, MD  sertraline (ZOLOFT) 25 MG tablet Take 25 mg by mouth daily.  09/25/17   [provider]  sodium bicarbonate 650 MG tablet Take 1,300 mg by mouth 2 (two) times daily.     [provider]    Allergies Mushroom extract complex and Amoxicillin  Family History  Problem Relation Age of Onset   Diabetes Mother    Kidney disease Mother        mother on dialysis   CAD Mother    Diabetes Sister    CAD Sister     Social History Social History   Tobacco Use   Smoking status: Heavy Smoker    Packs/day: 1.00    Years: 20.00    Pack years: 20.00    Types: Cigarettes    Last attempt to quit: 08/22/2017    Years since quitting:  3.6   Smokeless tobacco: Never  Vaping Use   Vaping Use: Never used  Substance Use Topics   Alcohol use: Not Currently    Comment: occ. alcohol use   Drug use: No    Review of Systems Constitutional: No fever/chills Eyes: No visual changes. ENT: No sore throat. Cardiovascular: Denies chest pain. Respiratory: Positive for shortness of breath. Gastrointestinal: No abdominal pain.  No nausea, no vomiting.  No diarrhea.   Genitourinary: Negative for dysuria. Musculoskeletal: Negative for back pain. Skin: Negative for rash. Neurological: Negative for headaches, focal weakness or numbness.  ____________________________________________   PHYSICAL EXAM:  VITAL SIGNS: ED Triage Vitals  Enc Vitals Group     BP 04/29/21 1624 (!) 211/128     Pulse Rate 04/29/21 1622 88     Resp 04/29/21 1622 19     Temp 04/29/21 1622 98.6 F (  37 C)     Temp Source 04/29/21 1622 Oral     SpO2 04/29/21 1622 100 %     Weight 04/29/21 1624 140 lb 3.4 oz (63.6 kg)     Height 04/29/21 1624 '5\' 6"'$  (1.676 m)     Head Circumference --      Peak Flow --      Pain Score 04/29/21 1623 3   Constitutional: Alert and oriented.  Eyes: Conjunctivae are normal.  ENT      Head: Normocephalic and atraumatic.      Nose: No congestion/rhinnorhea.      Mouth/Throat: Mucous membranes are moist.      Neck: No stridor. Hematological/Lymphatic/Immunilogical: No cervical lymphadenopathy. Cardiovascular: Normal rate, regular rhythm.  No murmurs, rubs, or gallops.  Respiratory: Normal respiratory effort without tachypnea nor retractions. Breath sounds are clear and equal bilaterally. No wheezes/rales/rhonchi. Gastrointestinal: Soft and non tender. No rebound. No guarding.  Genitourinary: Deferred Musculoskeletal: Normal range of motion in all extremities. No lower extremity edema. Neurologic:  Normal speech and language. No gross focal neurologic deficits are appreciated.  Skin:  Skin is warm, dry and intact. No rash  noted. Psychiatric: Mood and affect are normal. Speech and behavior are normal. Patient exhibits appropriate insight and judgment.  ____________________________________________    LABS (pertinent positives/negatives)  Trop hs 74 CBC wbc 12.8, hgb 8.8, plt 216 BMP na 137, k 5.3, cr 19.19  ____________________________________________   EKG  I, Nance Pear, attending physician, personally viewed and interpreted this EKG  EKG Time: 1627 Rate: 81 Rhythm: normal sinus rhythm Axis: normal Intervals: qtc 483 QRS: narrow, LVH ST changes: no st elevation Impression: abnormal ekg ____________________________________________    RADIOLOGY  CXR Small bilateral pleural effusions with vascular congestion and possible pulmonary edema  ____________________________________________   PROCEDURES  Procedures  ____________________________________________   INITIAL IMPRESSION / ASSESSMENT AND PLAN / ED COURSE  Pertinent labs & imaging results that were available during my care of the patient were reviewed by me and considered in my medical decision making (see chart for details).   Patient presents to the emergency department today because of concern for shortness of breath. Patient does describe orthopnea. CXR shows pulmonary edema. Initial troponin was elevated however repeat without significant change, do think the elevation is likely secondary to ESRD. Did discuss possible edema with patient. Did order lasix IV. Discussed with patient that I would like to observe him after IV lasix to see if he improved however he requested discharge. Discussed that I would like him to return for any worsening symptoms.   ____________________________________________   FINAL CLINICAL IMPRESSION(S) / ED DIAGNOSES  Final diagnoses:  Shortness of breath  Acute pulmonary edema ([place])     Note: This dictation was prepared with Dragon dictation. Any transcriptional errors that result from this  process are unintentional     Nance Pear, MD 04/29/21 1949

## 2021-04-29 NOTE — Telephone Encounter (Signed)
Pt stated that he developed cough yesterday and last night was very SOB when laying on either side of his body. Pt stated that he is ESKD and awaiting transplant. Pt stated cough producing yellowish green and thick phlegm. Pt had several episodes of harsh cough. Pt c/o muscle aches, runny nose and mild headache. Pt did home covid test and read as negative. Advised pt due to his many health problems  and need for xray he will need to go to ED. Pt refused to go to ED. Called office and spoke with PCP nurse who stated PCP wants him to go to ED. Informed her pt refused. Joined the call to try and get pt to understand that is what his PCP wants him to do and that he needs to go and get evaluated. Advised pt that sx started yesterday and he is already unable to lie flat. Pt agreed to [facility] ED at [facility].         Reason for Disposition  [1] MILD difficulty breathing (e.g., minimal/no SOB at rest, SOB with walking, pulse <100) AND [2] still present when not coughing  Answer Assessment - Initial Assessment Questions 1. ONSET: "When did the cough begin?"      yesterday 2. SEVERITY: "How bad is the cough today?"      Coughing hard  3. SPUTUM: "Describe the color of your sputum" (none, dry cough; clear, white, yellow, green)     Yellowish green -thick 4. HEMOPTYSIS: "Are you coughing up any blood?" If so ask: "How much?" (flecks, streaks, tablespoons, etc.)     no 5. DIFFICULTY BREATHING: "Are you having difficulty breathing?" If Yes, ask: "How bad is it?" (e.g., mild, moderate, severe)    - MILD: No SOB at rest, mild SOB with walking, speaks normally in sentences, can lie down, no retractions, pulse < 100.    - MODERATE: SOB at rest, SOB with minimal exertion and prefers to sit, cannot lie down flat, speaks in phrases, mild retractions, audible wheezing, pulse 100-120.    - SEVERE: Very SOB at rest, speaks in single words, struggling to breathe, sitting hunched forward, retractions, pulse > 120      SOB at  rest and worse with side sleeping moderate 6. FEVER: "Do you have a fever?" If Yes, ask: "What is your temperature, how was it measured, and when did it start?"     no 7. CARDIAC HISTORY: "Do you have any history of heart disease?" (e.g., heart attack, congestive heart failure)      no 8. LUNG HISTORY: "Do you have any history of lung disease?"  (e.g., pulmonary embolus, asthma, emphysema)     PFT last week at [facility] sent Pre testing for kidney translpant 9. PE RISK FACTORS: "Do you have a history of blood clots?" (or: recent major surgery, recent prolonged travel, bedridden)     no 10. OTHER SYMPTOMS: "Do you have any other symptoms?" (e.g., runny nose, wheezing, chest pain)       Muscle aches runny nose, headache (mild) 12. TRAVEL: "Have you traveled out of the country in the last month?" (e.g., travel history, exposures)       no  Protocols used: Cough - Acute Productive-A-AH

## 2021-04-29 NOTE — ED Notes (Signed)
Patient transported to X-ray 

## 2021-04-30 NOTE — Telephone Encounter (Signed)
This encounter was created in error - please disregard.

## 2021-05-02 ENCOUNTER — Observation Stay
Admission: EM | Admit: 2021-05-02 | Discharge: 2021-05-03 | Disposition: A | Discharge status: Home / Self Care | Payer: Medicare Other | Attending: Internal Medicine | Admitting: Internal Medicine

## 2021-05-02 ENCOUNTER — Other Ambulatory Visit: Payer: Self-pay

## 2021-05-02 ENCOUNTER — Emergency Department: Payer: Medicare Other

## 2021-05-02 ENCOUNTER — Inpatient Hospital Stay: Payer: Medicare Other

## 2021-05-02 DIAGNOSIS — J90 Pleural effusion, not elsewhere classified: Secondary | ICD-10-CM | POA: Diagnosis not present

## 2021-05-02 DIAGNOSIS — N2581 Secondary hyperparathyroidism of renal origin: Secondary | ICD-10-CM | POA: Diagnosis not present

## 2021-05-02 DIAGNOSIS — Z79899 Other long term (current) drug therapy: Secondary | ICD-10-CM | POA: Insufficient documentation

## 2021-05-02 DIAGNOSIS — Z992 Dependence on renal dialysis: Secondary | ICD-10-CM | POA: Insufficient documentation

## 2021-05-02 DIAGNOSIS — J9601 Acute respiratory failure with hypoxia: Secondary | ICD-10-CM | POA: Diagnosis not present

## 2021-05-02 DIAGNOSIS — E872 Acidosis, unspecified: Secondary | ICD-10-CM

## 2021-05-02 DIAGNOSIS — I517 Cardiomegaly: Secondary | ICD-10-CM | POA: Diagnosis not present

## 2021-05-02 DIAGNOSIS — F17210 Nicotine dependence, cigarettes, uncomplicated: Secondary | ICD-10-CM | POA: Insufficient documentation

## 2021-05-02 DIAGNOSIS — I5033 Acute on chronic diastolic (congestive) heart failure: Secondary | ICD-10-CM | POA: Diagnosis not present

## 2021-05-02 DIAGNOSIS — R0602 Shortness of breath: Secondary | ICD-10-CM | POA: Diagnosis not present

## 2021-05-02 DIAGNOSIS — J439 Emphysema, unspecified: Secondary | ICD-10-CM | POA: Diagnosis not present

## 2021-05-02 DIAGNOSIS — J810 Acute pulmonary edema: Secondary | ICD-10-CM

## 2021-05-02 DIAGNOSIS — Z20822 Contact with and (suspected) exposure to covid-19: Secondary | ICD-10-CM | POA: Diagnosis not present

## 2021-05-02 DIAGNOSIS — I10 Essential (primary) hypertension: Secondary | ICD-10-CM | POA: Diagnosis not present

## 2021-05-02 DIAGNOSIS — R0603 Acute respiratory distress: Secondary | ICD-10-CM

## 2021-05-02 DIAGNOSIS — J9811 Atelectasis: Secondary | ICD-10-CM | POA: Diagnosis not present

## 2021-05-02 DIAGNOSIS — N186 End stage renal disease: Secondary | ICD-10-CM | POA: Diagnosis not present

## 2021-05-02 DIAGNOSIS — D649 Anemia, unspecified: Secondary | ICD-10-CM | POA: Insufficient documentation

## 2021-05-02 DIAGNOSIS — I161 Hypertensive emergency: Principal | ICD-10-CM

## 2021-05-02 DIAGNOSIS — I700 Atherosclerosis of aorta: Secondary | ICD-10-CM | POA: Diagnosis not present

## 2021-05-02 DIAGNOSIS — R059 Cough, unspecified: Secondary | ICD-10-CM | POA: Diagnosis not present

## 2021-05-02 DIAGNOSIS — D631 Anemia in chronic kidney disease: Secondary | ICD-10-CM | POA: Diagnosis not present

## 2021-05-02 LAB — BLOOD GAS, VENOUS
Acid-base deficit: 14.6 mmol/L — ABNORMAL HIGH (ref 0.0–2.0)
Bicarbonate: 12 mmol/L — ABNORMAL LOW (ref 20.0–28.0)
O2 Saturation: 85.4 %
Patient temperature: 37
pCO2, Ven: 30 mmHg — ABNORMAL LOW (ref 44.0–60.0)
pH, Ven: 7.21 — ABNORMAL LOW (ref 7.250–7.430)
pO2, Ven: 62 mmHg — ABNORMAL HIGH (ref 32.0–45.0)

## 2021-05-02 LAB — CBC WITH DIFFERENTIAL/PLATELET
Abs Immature Granulocytes: 0.13 10*3/uL — ABNORMAL HIGH (ref 0.00–0.07)
Basophils Absolute: 0.1 10*3/uL (ref 0.0–0.1)
Basophils Relative: 1 %
Eosinophils Absolute: 0.2 10*3/uL (ref 0.0–0.5)
Eosinophils Relative: 2 %
HCT: 23.2 % — ABNORMAL LOW (ref 39.0–52.0)
Hemoglobin: 7.7 g/dL — ABNORMAL LOW (ref 13.0–17.0)
Immature Granulocytes: 1 %
Lymphocytes Relative: 11 %
Lymphs Abs: 1.3 10*3/uL (ref 0.7–4.0)
MCH: 31.4 pg (ref 26.0–34.0)
MCHC: 33.2 g/dL (ref 30.0–36.0)
MCV: 94.7 fL (ref 80.0–100.0)
Monocytes Absolute: 0.8 10*3/uL (ref 0.1–1.0)
Monocytes Relative: 7 %
Neutro Abs: 9.6 10*3/uL — ABNORMAL HIGH (ref 1.7–7.7)
Neutrophils Relative %: 78 %
Platelets: 207 10*3/uL (ref 150–400)
RBC: 2.45 MIL/uL — ABNORMAL LOW (ref 4.22–5.81)
RDW: 15.7 % — ABNORMAL HIGH (ref 11.5–15.5)
WBC: 12.2 10*3/uL — ABNORMAL HIGH (ref 4.0–10.5)
nRBC: 0 % (ref 0.0–0.2)

## 2021-05-02 LAB — BASIC METABOLIC PANEL
Anion gap: 17 — ABNORMAL HIGH (ref 5–15)
BUN: 87 mg/dL — ABNORMAL HIGH (ref 6–20)
CO2: 17 mmol/L — ABNORMAL LOW (ref 22–32)
Calcium: 7.8 mg/dL — ABNORMAL LOW (ref 8.9–10.3)
Chloride: 104 mmol/L (ref 98–111)
Creatinine, Ser: 19.18 mg/dL — ABNORMAL HIGH (ref 0.61–1.24)
GFR, Estimated: 3 mL/min — ABNORMAL LOW (ref >60)
Glucose, Bld: 107 mg/dL — ABNORMAL HIGH (ref 70–99)
Potassium: 5 mmol/L (ref 3.5–5.1)
Sodium: 138 mmol/L (ref 135–145)

## 2021-05-02 LAB — TROPONIN I (HIGH SENSITIVITY)
Troponin I (High Sensitivity): 83 ng/L — ABNORMAL HIGH (ref <18)
Troponin I (High Sensitivity): 86 ng/L — ABNORMAL HIGH (ref <18)
Troponin I (High Sensitivity): 89 ng/L — ABNORMAL HIGH (ref <18)
Troponin I (High Sensitivity): 91 ng/L — ABNORMAL HIGH (ref <18)

## 2021-05-02 LAB — EXPECTORATED SPUTUM ASSESSMENT W GRAM STAIN, RFLX TO RESP C

## 2021-05-02 LAB — RESP PANEL BY RT-PCR (FLU A&B, COVID) ARPGX2
Influenza A by PCR: NEGATIVE
Influenza B by PCR: NEGATIVE
SARS Coronavirus 2 by RT PCR: NEGATIVE

## 2021-05-02 LAB — BRAIN NATRIURETIC PEPTIDE: B Natriuretic Peptide: 4453.2 pg/mL — ABNORMAL HIGH (ref 0.0–100.0)

## 2021-05-02 MED ORDER — CALCIUM ACETATE (PHOS BINDER) 667 MG PO CAPS
1334.0000 mg | ORAL_CAPSULE | Freq: Three times a day (TID) | ORAL | Status: DC
  Administered 2021-05-03 (×2): 1334 mg via ORAL
  Filled 2021-05-02 (×5): qty 2

## 2021-05-02 MED ORDER — HEPARIN SODIUM (PORCINE) 5000 UNIT/ML IJ SOLN
5000.0000 [IU] | Freq: Three times a day (TID) | INTRAMUSCULAR | Status: DC
  Administered 2021-05-02 – 2021-05-03 (×2): 5000 [IU] via SUBCUTANEOUS
  Filled 2021-05-02 (×3): qty 1

## 2021-05-02 MED ORDER — FUROSEMIDE 10 MG/ML IJ SOLN
100.0000 mg | Freq: Once | INTRAVENOUS | Status: AC
  Administered 2021-05-02: 100 mg via INTRAVENOUS
  Filled 2021-05-02: qty 10

## 2021-05-02 MED ORDER — ONDANSETRON HCL 4 MG/2ML IJ SOLN: 4.0000 mg | Freq: Once | INTRAMUSCULAR | Status: AC

## 2021-05-02 MED ORDER — NICOTINE 21 MG/24HR TD PT24
21.0000 mg | MEDICATED_PATCH | Freq: Every day | TRANSDERMAL | Status: DC
  Administered 2021-05-02 – 2021-05-03 (×2): 21 mg via TRANSDERMAL
  Filled 2021-05-02 (×2): qty 1

## 2021-05-02 MED ORDER — HEPARIN 1000 UNIT/ML FOR PERITONEAL DIALYSIS
500.0000 [IU] | INTRAMUSCULAR | Status: DC | PRN
  Filled 2021-05-02: qty 0.5

## 2021-05-02 MED ORDER — SODIUM BICARBONATE 650 MG PO TABS
1300.0000 mg | ORAL_TABLET | Freq: Two times a day (BID) | ORAL | Status: DC
  Administered 2021-05-03 (×2): 1300 mg via ORAL
  Filled 2021-05-02 (×4): qty 2

## 2021-05-02 MED ORDER — DELFLEX-LC/1.5% DEXTROSE 344 MOSM/L IP SOLN
INTRAPERITONEAL | Status: DC
  Filled 2021-05-02 (×2): qty 3000

## 2021-05-02 MED ORDER — LABETALOL HCL 5 MG/ML IV SOLN: 20.0000 mg | INTRAVENOUS | Status: DC | PRN

## 2021-05-02 MED ORDER — LOSARTAN POTASSIUM 50 MG PO TABS
100.0000 mg | ORAL_TABLET | Freq: Every day | ORAL | Status: DC
  Administered 2021-05-02 – 2021-05-03 (×2): 100 mg via ORAL
  Filled 2021-05-02 (×2): qty 2

## 2021-05-02 MED ORDER — ALBUTEROL SULFATE (2.5 MG/3ML) 0.083% IN NEBU: 2.5000 mg | INHALATION_SOLUTION | RESPIRATORY_TRACT | Status: DC | PRN

## 2021-05-02 MED ORDER — CLONIDINE HCL 0.1 MG/24HR TD PTWK
0.1000 mg | MEDICATED_PATCH | TRANSDERMAL | Status: DC
  Filled 2021-05-02: qty 1

## 2021-05-02 MED ORDER — GENTAMICIN SULFATE 0.1 % EX CREA
1.0000 "application " | TOPICAL_CREAM | Freq: Every day | CUTANEOUS | Status: DC
  Filled 2021-05-02: qty 15

## 2021-05-02 MED ORDER — AMLODIPINE BESYLATE 5 MG PO TABS
10.0000 mg | ORAL_TABLET | Freq: Every day | ORAL | Status: DC
  Administered 2021-05-02 – 2021-05-03 (×2): 10 mg via ORAL
  Filled 2021-05-02 (×2): qty 2

## 2021-05-02 MED ORDER — ONDANSETRON HCL 4 MG/2ML IJ SOLN
4.0000 mg | Freq: Four times a day (QID) | INTRAMUSCULAR | Status: DC | PRN
  Filled 2021-05-02: qty 2

## 2021-05-02 MED ORDER — NITROGLYCERIN IN D5W 200-5 MCG/ML-% IV SOLN
0.0000 ug/min | INTRAVENOUS | Status: DC
  Administered 2021-05-02: 80 ug/min via INTRAVENOUS
  Administered 2021-05-02: 50 ug/min via INTRAVENOUS
  Administered 2021-05-03: 110 ug/min via INTRAVENOUS
  Filled 2021-05-02 (×3): qty 250

## 2021-05-02 MED ORDER — FUROSEMIDE 10 MG/ML IJ SOLN
100.0000 mg | Freq: Two times a day (BID) | INTRAVENOUS | Status: DC
  Administered 2021-05-02 – 2021-05-03 (×2): 100 mg via INTRAVENOUS
  Filled 2021-05-02 (×4): qty 10

## 2021-05-02 MED ORDER — ONDANSETRON HCL 4 MG/2ML IJ SOLN
INTRAMUSCULAR | Status: AC
  Administered 2021-05-02: 4 mg via INTRAVENOUS
  Filled 2021-05-02: qty 2

## 2021-05-02 MED ORDER — FERROUS SULFATE 325 (65 FE) MG PO TABS
325.0000 mg | ORAL_TABLET | Freq: Three times a day (TID) | ORAL | Status: DC
  Administered 2021-05-03: 325 mg via ORAL
  Filled 2021-05-02: qty 1

## 2021-05-02 MED ORDER — ZOLPIDEM TARTRATE 5 MG PO TABS
5.0000 mg | ORAL_TABLET | Freq: Every day | ORAL | Status: DC
  Administered 2021-05-02: 5 mg via ORAL
  Filled 2021-05-02: qty 1

## 2021-05-02 MED ORDER — MORPHINE SULFATE (PF) 2 MG/ML IV SOLN
2.0000 mg | INTRAVENOUS | Status: DC | PRN
  Administered 2021-05-02 – 2021-05-03 (×3): 2 mg via INTRAVENOUS
  Filled 2021-05-02 (×3): qty 1

## 2021-05-02 MED ORDER — METOPROLOL SUCCINATE ER 50 MG PO TB24
100.0000 mg | ORAL_TABLET | Freq: Every day | ORAL | Status: DC
  Administered 2021-05-02 – 2021-05-03 (×2): 100 mg via ORAL
  Filled 2021-05-02 (×2): qty 2

## 2021-05-02 MED ORDER — MORPHINE SULFATE (PF) 2 MG/ML IV SOLN: 2.0000 mg | INTRAVENOUS | Status: DC | PRN

## 2021-05-02 MED ORDER — TRAMADOL HCL 50 MG PO TABS
50.0000 mg | ORAL_TABLET | Freq: Once | ORAL | Status: AC
  Administered 2021-05-02: 50 mg via ORAL
  Filled 2021-05-02: qty 1

## 2021-05-02 MED ORDER — PANTOPRAZOLE SODIUM 40 MG PO TBEC
40.0000 mg | DELAYED_RELEASE_TABLET | Freq: Two times a day (BID) | ORAL | Status: DC
  Administered 2021-05-02 – 2021-05-03 (×2): 40 mg via ORAL
  Filled 2021-05-02 (×2): qty 1

## 2021-05-02 MED ORDER — ACETAMINOPHEN 650 MG RE SUPP: 650.0000 mg | Freq: Four times a day (QID) | RECTAL | Status: DC | PRN

## 2021-05-02 MED ORDER — HYDRALAZINE HCL 20 MG/ML IJ SOLN: 10.0000 mg | Freq: Once | INTRAMUSCULAR | Status: DC

## 2021-05-02 MED ORDER — FAMOTIDINE 20 MG PO TABS: 20.0000 mg | ORAL_TABLET | Freq: Two times a day (BID) | ORAL | Status: DC | PRN

## 2021-05-02 MED ORDER — ONDANSETRON HCL 4 MG/2ML IJ SOLN
4.0000 mg | Freq: Four times a day (QID) | INTRAMUSCULAR | Status: DC
  Administered 2021-05-02 – 2021-05-03 (×4): 4 mg via INTRAVENOUS
  Filled 2021-05-02 (×4): qty 2

## 2021-05-02 MED ORDER — ONDANSETRON HCL 4 MG PO TABS: 4.0000 mg | ORAL_TABLET | Freq: Four times a day (QID) | ORAL | Status: DC | PRN

## 2021-05-02 MED ORDER — HYDRALAZINE HCL 50 MG PO TABS: 50.0000 mg | ORAL_TABLET | Freq: Every day | ORAL | Status: DC | PRN

## 2021-05-02 MED ORDER — ACETAMINOPHEN 325 MG PO TABS
650.0000 mg | ORAL_TABLET | Freq: Four times a day (QID) | ORAL | Status: DC | PRN
  Administered 2021-05-02: 650 mg via ORAL
  Filled 2021-05-02 (×2): qty 2

## 2021-05-02 NOTE — Progress Notes (Signed)
Larry [name], Larry [name] 05/02/21  Subjective:   Hospital day # 0  Patient known to our practice from previous admission as well as outpatient PD He states that he presented to the emergency room for cough.  He thinks it may be related to his excessive smoking.  He says he has not missed any of his PD treatments and has been taking his blood pressure medicines except last night because of excessive cough which made him have some nausea and vomiting. Initially in the emergency room he was short of breath and had to be placed on BiPAP but when seen, he was off BiPAP and on room air.  His blood pressure however is significantly elevated.    Objective:  Vital signs in last 24 hours:  Temp:  [98 F (36.7 C)] 98 F (36.7 C) (08/11 0541) Pulse Rate:  [87-107] 101 (08/11 1115) Resp:  [14-34] 26 (08/11 1115) BP: (179-216)/(110-132) 179/121 (08/11 1115) SpO2:  [92 %-100 %] 100 % (08/11 1115) Weight:  [64 kg] 64 kg (08/11 0542)  Weight change:  Filed Weights   05/02/21 0542  Weight: 64 kg    Intake/Output:    Intake/Output Summary (Last 24 hours) at 05/02/2021 1219 Last data filed at 05/02/2021 1009 Gross per 24 hour  Intake --  Output 200 ml  Net -200 ml    Physical Exam: General:  No acute distress, laying in the bed  HEENT  anicteric, moist oral mucous membrane  Pulm/lungs  normal breathing effort, lungs are clear to auscultation  CVS/Heart  regular rhythm, no rub or gallop  Abdomen:   Soft, nontender  Extremities:  No peripheral edema  Neurologic:  Alert, oriented, able to follow commands  Skin:  No acute rashes    Basic Metabolic Panel:  Recent Labs  Lab 04/29/21 1635 05/02/21 0650  NA 137 138  K 5.3* 5.0  CL 106 104  CO2 15* 17*  GLUCOSE 96 107*  BUN 93* 87*  CREATININE 19.19* 19.18*  CALCIUM 8.3* 7.8*     CBC: Recent Labs  Lab 04/29/21 1635 05/02/21 0650  WBC 12.8* 12.2*  NEUTROABS  --  9.6*  HGB 8.8* 7.7*  HCT 27.1* 23.2*   MCV 93.1 94.7  PLT 216 207      Lab Results  Component Value Date   HEPBSAG NON REACTIVE 12/07/2020      Microbiology:  Recent Results (from the past 240 hour(s))  Resp Panel by RT-PCR (Flu A&B, Covid) Nasopharyngeal Swab     Status: None   Collection Time: 04/29/21  7:50 PM   Specimen: Nasopharyngeal Swab; Nasopharyngeal(NP) swabs in vial transport medium  Result Value Ref Range Status   SARS Coronavirus 2 by RT PCR NEGATIVE NEGATIVE Final    Comment: (NOTE) SARS-CoV-2 target nucleic acids are NOT DETECTED.  The SARS-CoV-2 RNA is generally detectable in upper respiratory specimens during the acute phase of infection. The lowest concentration of SARS-CoV-2 viral copies this assay can detect is 138 copies/mL. A negative result does not preclude SARS-Cov-2 infection and should not be used as the sole basis for treatment or other patient management decisions. A negative result may occur with  improper specimen collection/handling, submission of specimen other than nasopharyngeal swab, presence of viral mutation(s) within the areas targeted by this assay, and inadequate number of viral copies(<138 copies/mL). A negative result must be combined with clinical observations, patient history, and epidemiological information. The expected result is Negative.  Fact Sheet for Patients:  EntrepreneurPulse.com.au  Fact  Sheet for Healthcare Providers:  IncredibleEmployment.be  This test is no t yet approved or cleared by the [place] FDA and  has been authorized for detection and/or diagnosis of SARS-CoV-2 by FDA under an Emergency Use Authorization (EUA). This EUA will remain  in effect (meaning this test can be used) for the duration of the COVID-19 declaration under Section 564(b)(1) of the Act, 21 U.S.C.section 360bbb-3(b)(1), unless the authorization is terminated  or revoked sooner.       Influenza A by PCR NEGATIVE NEGATIVE Final    Influenza B by PCR NEGATIVE NEGATIVE Final    Comment: (NOTE) The Xpert Xpress SARS-CoV-2/FLU/RSV plus assay is intended as an aid in the diagnosis of influenza from Nasopharyngeal swab specimens and should not be used as a sole basis for treatment. Nasal washings and aspirates are unacceptable for Xpert Xpress SARS-CoV-2/FLU/RSV testing.  Fact Sheet for Patients: EntrepreneurPulse.com.au  Fact Sheet for Healthcare Providers: IncredibleEmployment.be  This test is not yet approved or cleared by the [place] FDA and has been authorized for detection and/or diagnosis of SARS-CoV-2 by FDA under an Emergency Use Authorization (EUA). This EUA will remain in effect (meaning this test can be used) for the duration of the COVID-19 declaration under Section 564(b)(1) of the Act, 21 U.S.C. section 360bbb-3(b)(1), unless the authorization is terminated or revoked.  Performed at [facility], [address] [place], [place] [address]   Resp Panel by RT-PCR (Flu A&B, Covid) Nasopharyngeal Swab     Status: None   Collection Time: 05/02/21  6:50 AM   Specimen: Nasopharyngeal Swab; Nasopharyngeal(NP) swabs in vial transport medium  Result Value Ref Range Status   SARS Coronavirus 2 by RT PCR NEGATIVE NEGATIVE Final    Comment: (NOTE) SARS-CoV-2 target nucleic acids are NOT DETECTED.  The SARS-CoV-2 RNA is generally detectable in upper respiratory specimens during the acute phase of infection. The lowest concentration of SARS-CoV-2 viral copies this assay can detect is 138 copies/mL. A negative result does not preclude SARS-Cov-2 infection and should not be used as the sole basis for treatment or other patient management decisions. A negative result may occur with  improper specimen collection/handling, submission of specimen other than nasopharyngeal swab, presence of viral mutation(s) within the areas targeted by this assay, and  inadequate number of viral copies(<138 copies/mL). A negative result must be combined with clinical observations, patient history, and epidemiological information. The expected result is Negative.  Fact Sheet for Patients:  EntrepreneurPulse.com.au  Fact Sheet for Healthcare Providers:  IncredibleEmployment.be  This test is no t yet approved or cleared by the [place] FDA and  has been authorized for detection and/or diagnosis of SARS-CoV-2 by FDA under an Emergency Use Authorization (EUA). This EUA will remain  in effect (meaning this test can be used) for the duration of the COVID-19 declaration under Section 564(b)(1) of the Act, 21 U.S.C.section 360bbb-3(b)(1), unless the authorization is terminated  or revoked sooner.       Influenza A by PCR NEGATIVE NEGATIVE Final   Influenza B by PCR NEGATIVE NEGATIVE Final    Comment: (NOTE) The Xpert Xpress SARS-CoV-2/FLU/RSV plus assay is intended as an aid in the diagnosis of influenza from Nasopharyngeal swab specimens and should not be used as a sole basis for treatment. Nasal washings and aspirates are unacceptable for Xpert Xpress SARS-CoV-2/FLU/RSV testing.  Fact Sheet for Patients: EntrepreneurPulse.com.au  Fact Sheet for Healthcare Providers: IncredibleEmployment.be  This test is not yet approved or cleared by the [place] FDA  and has been authorized for detection and/or diagnosis of SARS-CoV-2 by FDA under an Emergency Use Authorization (EUA). This EUA will remain in effect (meaning this test can be used) for the duration of the COVID-19 declaration under Section 564(b)(1) of the Act, 21 U.S.C. section 360bbb-3(b)(1), unless the authorization is terminated or revoked.  Performed at [facility], [address] [place], [place] [address]     Coagulation Studies: No results for input(s): LABPROT, INR in the last 72  hours.  Urinalysis: No results for input(s): COLORURINE, LABSPEC, PHURINE, GLUCOSEU, HGBUR, BILIRUBINUR, KETONESUR, PROTEINUR, UROBILINOGEN, NITRITE, LEUKOCYTESUR in the last 72 hours.  Invalid input(s): APPERANCEUR    Imaging: CT CHEST WO CONTRAST  Result Date: 05/02/2021 CLINICAL DATA:  Shortness of breath. EXAM: CT CHEST WITHOUT CONTRAST TECHNIQUE: Multidetector CT imaging of the chest was performed following the standard protocol without IV contrast. COMPARISON:  Chest radiograph 05/02/2021 and chest CT from 12/06/2020 FINDINGS: Cardiovascular: Coronary, aortic arch, and branch vessel atherosclerotic vascular disease. Mediastinum/Nodes: Scattered borderline prominent mediastinal lymph nodes including a 1.0 cm right lower paratracheal node on image 45 series 2 (formerly 0.6 cm) and an AP window lymph node measuring 1.0 cm in short axis on image 62 series 2 (formerly 0.5 cm). Subcarinal node 1.2 cm in short axis image 79 series 2 (formerly 0.7 cm). Lungs/Pleura: Small bilateral pleural effusions with associated passive atelectasis. Biapical pleuroparenchymal scarring. Paraseptal emphysema. Secondary pulmonary lobular interstitial accentuation with patchy ground-glass opacities in both upper lobes medially but favoring the right upper lobe. Airway thickening is present, suggesting bronchitis or reactive airways disease. Faint tree-in-bud nodularity in the right lower lobe on image 79 series 3. Upper Abdomen: Cholecystectomy.  Abdominal aortic atherosclerosis. Musculoskeletal: Healing right anterolateral ninth rib fracture on image 150 series 3. IMPRESSION: 1. Although atypical infection is not completely excluded, the appearance of secondary pulmonary lobular interstitial accentuation peripherally, airway thickening, and small bilateral pleural effusions tends to favor cardiogenic edema as a cause for the findings on today's exam. Borderline enlarged lymph nodes are likely congested. Ground-glass  opacities medially in the upper lobes probably representing airspace edema. 2.  Aortic Atherosclerosis (ICD10-I70.0).  Coronary atherosclerosis. 3. Healing right anterolateral ninth rib fracture. Electronically Signed   By: Van Clines M.D.   On: 05/02/2021 12:14   DG Chest Port 1 View  Result Date: 05/02/2021 CLINICAL DATA:  55 year old male with shortness of breath. Onset of cough overnight. Smoker. EXAM: PORTABLE CHEST 1 VIEW COMPARISON:  Chest radiographs 04/29/2021 and earlier. FINDINGS: Portable AP upright view at 0552 hours. Chronic but increased bilateral pulmonary interstitial opacity, now more asymmetric and greater on the right. Stable borderline to mild cardiomegaly. Other mediastinal contours are within normal limits. Visualized tracheal air column is within normal limits. No pneumothorax, consolidation or pleural effusion. No acute osseous abnormality identified. IMPRESSION: Increasing bilateral pulmonary interstitial opacity since 04/29/2021. Despite borderline to mild cardiomegaly the appearance favors acute viral/atypical respiratory infection over interstitial edema. Electronically Signed   By: Genevie Ann M.D.   On: 05/02/2021 06:11     Medications:    nitroGLYCERIN 80 mcg/min (05/02/21 1157)    cloNIDine  0.1 mg Transdermal Weekly   heparin  5,000 Units Subcutaneous Q8H   hydrALAZINE  10 mg Intravenous Once   nicotine  21 mg Transdermal Daily   ondansetron (ZOFRAN) IV  4 mg Intravenous Q6H   acetaminophen **OR** acetaminophen, albuterol, ondansetron **OR** ondansetron (ZOFRAN) IV  Assessment/ Plan:  55 y.o. male with     admitted on  05/02/2021 for Hypertensive emergency [I16.1]  Outpatient PD prescription includes 4 cycles of 2300, 80% tidal, dry weight 65.5 kg  #End-stage renal disease -We will plan for resuming peritoneal dialysis tonight  #Anemia of chronic kidney disease Lab Results  Component Value Date   HGB 7.7 (L) 05/02/2021  Monitor closely. Currently  getting 40,000 units of EPO monthly as outpatient  #Secondary hyperparathyroidism  Lab Results  Component Value Date   CALCIUM 7.8 (L) 05/02/2021   CAION 1.07 (L) 10/03/2020   PHOS 6.4 (H) 03/12/2021  Outpatient phosphorus poorly uncontrolled at 12.3 on August 1 2 PTH of 1329 Continue Sensipar Resume phoslo with diet   #Shortness of breath with malignant hypertension Currently treated with IV nitroglycerin drip.  IV furosemide for diuresis.  Added clonidine patch Home regimen includes amlodipine 10 mg daily, losartan 50 mg daily, furosemide 80 mg daily, metoprolol 100 mg daily     LOS: 0 Meiah Zamudio 8/11/202212:19 PM  [place], [place]  Note: This note was prepared with Dragon dictation. Any transcription errors are unintentional

## 2021-05-02 NOTE — H&P (Signed)
History and Physical    Larry [name] [id number] DOB: 10-28-1965 DOA: 05/02/2021  PCP: Jon Billings, NP  Patient coming from: home  I have personally briefly reviewed patient's old medical records in [place]  Chief Complaint: sob/cough  HPI: Larry [name] is a 55 y.o. male with medical history significant of   55 y.o. male with medical history significant of ESRD-peritoneal dialysis, nephrotic syndrome, HTN, GERD, depression, anemia, PAF not on anticoagulant with recent interim history of admission  6/15/6-21 for uremia due to noncompliance with PD, HCAP,  gi bleed due to esophagitis who presents to ed for the second time in one weeks with increasing sob and each time found to be fluid overloaded. Patient presents today with continued worsening symptoms of sob despite treatment with lasix in ed few days ago.  Patient notes no associated chest pain, palpitations, n/v/d/fever/chills ,but notes cough and orthopnea.  Patient in ed found to have HTN Emergency with associated pulmonary edema and hypoxic respiratory failure requiring bipap. Patient states he has been complaint with his PD. Dr  Manuella Ghazi was consulted from ED.  ED Course: Afeb:193/132,-215/127 hr 107, rr 34  , 89% on ra 96 on bipap EKG nsr LVH  no st -t wave changes [id number] bilateral pulmonary interstitial opacity since 04/29/2021. Abg:ph7.24, pco28, ( on bipap) Wbc:12.2( stable from prior) hg 7.7down from 8.8( close to baseline ) Bnp 4453 Covid:neg Na:138, K 5, bicarb 17, cr 19.18 CE 83 ( at baseline) Tx: hydralazine, nitrodrip , high does lasix   Review of Systems: As per HPI otherwise 10 point review of systems negative.   Past Medical History:  Diagnosis Date   Anemia    ESRD on peritoneal dialysis ([place])    Hypertension    Nephrotic syndrome     Past Surgical History:  Procedure Laterality Date   CAPD INSERTION N/A 01/06/2018   Procedure: LAPAROSCOPIC INSERTION CONTINUOUS AMBULATORY PERITONEAL  DIALYSIS  (CAPD) CATHETER;  Surgeon: Katha Cabal, MD;  Location: [facility] ORS;  Service: Vascular;  Laterality: N/A;   CHOLECYSTECTOMY     CYSTOSCOPY W/ URETERAL STENT REMOVAL Right 10/23/2017   Procedure: CYSTOSCOPY WITH STENT REPLACEMENT;  Surgeon: [name], MD;  Location: [facility] OR[facility];  Service: Urology;  Laterality: Right;   CYSTOSCOPY WITH STENT PLACEMENT Right 09/30/2017   Procedure: CYSTOSCOPY WITH STENT PLACEMENT;  Surgeon: [name], MD;  Location: [facility];  Service: Urology;  Laterality: Right;   CYSTOSCOPY/RETROGRADE/URETEROSCOPY Right 10/23/2017   Procedure: CYSTOSCOPY/RETROGRADE/URETEROSCOPY;  Surgeon: [name], MD;  Location: [facility];  Service: Urology;  Laterality: Right;   ESOPHAGOGASTRODUODENOSCOPY N/A 03/07/2021   Procedure: ESOPHAGOGASTRODUODENOSCOPY (EGD);  Surgeon: Toledo, Benay Pike, MD;  Location: [facility];  Service: Gastroenterology;  Laterality: N/A;   RENAL BIOPSY     TEMPORARY DIALYSIS CATHETER N/A 12/07/2020   Procedure: TEMPORARY DIALYSIS CATHETER;  Surgeon: Katha Cabal, MD;  Location: [facility];  Service: Cardiovascular;  Laterality: N/A;     reports that he has been smoking cigarettes. He has a 20.00 pack-year smoking history. He has never used smokeless tobacco. He reports that he does not currently use alcohol. He reports that he does not use drugs.  Allergies  Allergen Reactions   Mushroom Extract Complex     Headache, migraines   Amoxicillin Nausea And Vomiting    Weakness  Has patient had a PCN reaction causing immediate rash, facial/tongue/throat swelling, SOB or lightheadedness with hypotension: No Has patient had a PCN reaction causing severe rash  involving mucus membranes or skin necrosis: No Has patient had a PCN reaction that required hospitalization: No Has patient had a PCN reaction occurring within the last 10 years: No If all of the above answers are "NO", then may proceed with Cephalosporin use.      Family History  Problem Relation Age of Onset   Diabetes Mother    Kidney disease Mother        mother on dialysis   CAD Mother    Diabetes Sister    CAD Sister    Prior to Admission medications   Medication Sig Start Date End Date Taking? Authorizing Provider  acetaminophen (TYLENOL) 500 MG tablet Take 500-1,000 mg by mouth every 6 (six) hours as needed for mild pain or moderate pain.    [provider]  amLODipine (NORVASC) 10 MG tablet Take 1 tablet (10 mg total) by mouth daily. 01/16/21   Minna Merritts, MD  benzonatate (TESSALON) 200 MG capsule Take 1 capsule (200 mg total) by mouth 3 (three) times daily as needed for cough. 03/18/21   Jon Billings, NP  calcium acetate (PHOSLO) 667 MG capsule Take 1,334 mg by mouth 3 (three) times daily. 12/03/20   [provider]  famotidine (PEPCID) 20 MG tablet Take 20 mg by mouth 2 (two) times daily as needed for heartburn or indigestion.    [provider]  ferrous sulfate 325 (65 FE) MG tablet Take 1 tablet (325 mg total) by mouth 3 (three) times daily with meals. 03/12/21   Lavina Hamman, MD  hydrALAZINE (APRESOLINE) 50 MG tablet Take 50 mg by mouth daily as needed (If diastolic bp is 90 or above).     [provider]  losartan (COZAAR) 50 MG tablet Take 2 tablets (100 mg total) by mouth daily. 03/12/21   Lavina Hamman, MD  metoprolol succinate (TOPROL-XL) 100 MG 24 hr tablet Take 1 tablet (100 mg total) by mouth daily. 03/18/21   Jon Billings, NP  ondansetron (ZOFRAN) 4 MG tablet Take 1 tablet (4 mg total) by mouth daily as needed for nausea or vomiting. 03/12/21 03/12/22  Lavina Hamman, MD  pantoprazole (PROTONIX) 40 MG tablet Take 1 tablet (40 mg total) by mouth 2 (two) times daily. 03/12/21   Lavina Hamman, MD  potassium chloride SA (KLOR-CON) 20 MEQ tablet Take 1 tablet (20 mEq total) by mouth daily for 3 days. 03/12/21 03/15/21  Lavina Hamman, MD  sertraline (ZOLOFT) 25 MG tablet Take 25  mg by mouth daily.  09/25/17   [provider]  sodium bicarbonate 650 MG tablet Take 1,300 mg by mouth 2 (two) times daily.     [provider]    Physical Exam: Vitals:   05/02/21 0825 05/02/21 0830 05/02/21 0832 05/02/21 0834  BP:    (!) 193/125  Pulse: 98 97 96 95  Resp: '16 16 16 20  '$ Temp:      TempSrc:      SpO2: 100% 99% 100% 100%  Weight:      Height:         Vitals:   05/02/21 0825 05/02/21 0830 05/02/21 0832 05/02/21 0834  BP:    (!) 193/125  Pulse: 98 97 96 95  Resp: '16 16 16 20  '$ Temp:      TempSrc:      SpO2: 100% 99% 100% 100%  Weight:      Height:      Constitutional: NAD, calm, comfortable Eyes: PERRL, lids and  conjunctivae normal ENMT: Mucous membranes are moist. Posterior pharynx clear of any exudate or lesions.Normal dentition.  Neck: normal, supple, no masses, no thyromegaly Respiratory:  + wheezing, no crackles. Decrease  bs,Normal respiratory effort. No accessory muscle use.  Cardiovascular: Regular rate and rhythm, no murmurs / rubs / gallops. No extremity edema. 2+ pedal pulses. No carotid bruits.  Abdomen: no tenderness, no masses palpated. No hepatosplenomegaly. Bowel sounds positive.  Musculoskeletal: no clubbing / cyanosis. No joint deformity upper and lower extremities. Good ROM, no contractures. Normal muscle tone.  Skin: no rashes, lesions, ulcers. No induration Neurologic: CN 2-12 grossly intact. Sensation intact, Strength 5/5 in all 4.  Psychiatric: Normal judgment and insight. Alert and oriented x 3. Normal mood.    Labs on Admission: I have personally reviewed following labs and imaging studies  CBC: Recent Labs  Lab 04/29/21 1635 05/02/21 0650  WBC 12.8* 12.2*  NEUTROABS  --  9.6*  HGB 8.8* 7.7*  HCT 27.1* 23.2*  MCV 93.1 94.7  PLT 216 [id number]   Basic Metabolic Panel: Recent Labs  Lab 04/29/21 1635 05/02/21 0650  NA 137 138  K 5.3* 5.0  CL 106 104  CO2 15* 17*  GLUCOSE 96 107*  BUN 93* 87*  CREATININE  19.19* 19.18*  CALCIUM 8.3* 7.8*   GFR: Estimated Creatinine Clearance: 4 mL/min (A) (by C-G formula based on SCr of 19.18 mg/dL (H)). Liver Function Tests: No results for input(s): AST, ALT, ALKPHOS, BILITOT, PROT, ALBUMIN in the last 168 hours. No results for input(s): LIPASE, AMYLASE in the last 168 hours. No results for input(s): AMMONIA in the last 168 hours. Coagulation Profile: No results for input(s): INR, PROTIME in the last 168 hours. Cardiac Enzymes: No results for input(s): CKTOTAL, CKMB, CKMBINDEX, TROPONINI in the last 168 hours. BNP (last 3 results) No results for input(s): PROBNP in the last 8760 hours. HbA1C: No results for input(s): HGBA1C in the last 72 hours. CBG: No results for input(s): GLUCAP in the last 168 hours. Lipid Profile: No results for input(s): CHOL, HDL, LDLCALC, TRIG, CHOLHDL, LDLDIRECT in the last 72 hours. Thyroid Function Tests: No results for input(s): TSH, T4TOTAL, FREET4, T3FREE, THYROIDAB in the last 72 hours. Anemia Panel: No results for input(s): VITAMINB12, FOLATE, FERRITIN, TIBC, IRON, RETICCTPCT in the last 72 hours. Urine analysis:    Component Value Date/Time   COLORURINE YELLOW (A) 12/14/2018 1613   APPEARANCEUR CLEAR (A) 12/14/2018 1613   APPEARANCEUR Clear 10/15/2017 1512   LABSPEC 1.017 12/14/2018 1613   LABSPEC 1.011 01/18/2015 1423   PHURINE 7.0 12/14/2018 1613   GLUCOSEU 150 (A) 12/14/2018 1613   GLUCOSEU >=500 01/18/2015 1423   HGBUR SMALL (A) 12/14/2018 1613   BILIRUBINUR NEGATIVE 12/14/2018 1613   BILIRUBINUR Negative 10/15/2017 1512   BILIRUBINUR Negative 01/18/2015 1423   KETONESUR 5 (A) 12/14/2018 1613   PROTEINUR >=300 (A) 12/14/2018 1613   NITRITE NEGATIVE 12/14/2018 1613   LEUKOCYTESUR NEGATIVE 12/14/2018 1613   LEUKOCYTESUR Negative 01/18/2015 1423    Radiological Exams on Admission: DG Chest Port 1 View  Result Date: 05/02/2021 CLINICAL DATA:  54 year old male with shortness of breath. Onset of cough  overnight. Smoker. EXAM: PORTABLE CHEST 1 VIEW COMPARISON:  Chest radiographs 04/29/2021 and earlier. FINDINGS: Portable AP upright view at 0552 hours. Chronic but increased bilateral pulmonary interstitial opacity, now more asymmetric and greater on the right. Stable borderline to mild cardiomegaly. Other mediastinal contours are within normal limits. Visualized tracheal air column is within normal limits. No pneumothorax, consolidation  or pleural effusion. No acute osseous abnormality identified. IMPRESSION: Increasing bilateral pulmonary interstitial opacity since 04/29/2021. Despite borderline to mild cardiomegaly the appearance favors acute viral/atypical respiratory infection over interstitial edema. Electronically Signed   By: Genevie Ann M.D.   On: 05/02/2021 06:11    EKG: Independently reviewed.  Assessment/Plan  Hypertensive Emergency with associated pulmonary edema  -continue on nitro drip -resume home regimen per renal recs  - high dose lasix iv  per renal recs  -PD  planned for today   ESRD -with assc metabolic acidosis -PD today  -continue  bicarb supplementation  -phos binders   Abn CE -insetting of ESRD, noted to be at base   Anemia -continue iron supplement  -at base  -f/u renal recs  Leukocytosis -stress response , monitor counts   GERD -ppi   Anxiety depression -ssri  DVT prophylaxis:  hep sq Code Status: Full Family Communication: none at bedside Disposition Plan: patient  expected to be admitted greater than 2 midnights  Consults called:  Dr Candiss Norse renal Admission status: inpatient/step down   Clance Boll MD [facility]   If 7PM-7AM, please contact night-coverage [email or web address] Password [facility]  05/02/2021, 8:55 AM

## 2021-05-02 NOTE — ED Notes (Signed)
Pt with increased WOB. Pt requesting to be placed back onto BiPAP. RT called. MD made aware

## 2021-05-02 NOTE — ED Provider Notes (Addendum)
[facility] Emergency Department Provider Note  ____________________________________________   Event Date/Time   First MD Initiated Contact with Patient 05/02/21 [id number]     (approximate)  I have reviewed the triage vital signs and the nursing notes.   HISTORY  Chief Complaint Shortness of Breath and Cough    HPI Larry [name] is a 55 y.o. male with history of end-stage renal disease on peritoneal dialysis, hypertension, atrial fibrillation who presents to the emergency department with complaints of shortness of breath that has been ongoing since August 8.  Was seen here in the emergency department on August 8 for the same.  Chest x-ray at that time showed pulmonary edema.  Was given IV Lasix with plan to reassess after diuresis however patient requested discharge.  He denies any chest pain only some discomfort with coughing.  Has had a nonproductive cough.  No known fevers or chills but feels warm to touch.  No vomiting or diarrhea.  No abdominal pain.  No changes in his dialysate fluid.  No lower extremity swelling or pain.  No history of PE, DVT, asthma, COPD.        Past Medical History:  Diagnosis Date   Anemia    ESRD on peritoneal dialysis ([place])    Hypertension    Nephrotic syndrome     Patient Active Problem List   Diagnosis Date Noted   Uremia 03/06/2021   Hyperkalemia 03/06/2021   Coffee ground emesis 03/06/2021   Sepsis ([place]) 03/06/2021   HCAP (healthcare-associated pneumonia) 03/06/2021   Anemia in ESRD (end-stage renal disease) ([place]) 03/06/2021   GERD (gastroesophageal reflux disease) 03/06/2021   Pericarditis 12/09/2020   Uremia syndrome 12/09/2020   Paroxysmal atrial fibrillation (HCC)    HTN (hypertension) 12/06/2020   Nicotine dependence 12/06/2020   Intractable vomiting with nausea 12/06/2020   ESRD on peritoneal dialysis ([place]) 10/29/2020   Renal dialysis device, implant, or graft complication [id number]   Open displaced  fracture of proximal phalanx of left thumb 02/29/2020   Anemia of chronic kidney failure, stage 5 ([place]) 12/28/2017   Hydronephrosis 10/15/2017   AKI (acute kidney injury) ([place]) 09/30/2017    Past Surgical History:  Procedure Laterality Date   CAPD INSERTION N/A 01/06/2018   Procedure: LAPAROSCOPIC INSERTION CONTINUOUS AMBULATORY PERITONEAL DIALYSIS  (CAPD) CATHETER;  Surgeon: Katha Cabal, MD;  Location: [facility] ORS;  Service: Vascular;  Laterality: N/A;   CHOLECYSTECTOMY     CYSTOSCOPY W/ URETERAL STENT REMOVAL Right 10/23/2017   Procedure: CYSTOSCOPY WITH STENT REPLACEMENT;  Surgeon: [name], MD;  Location: [facility];  Service: Urology;  Laterality: Right;   CYSTOSCOPY WITH STENT PLACEMENT Right 09/30/2017   Procedure: CYSTOSCOPY WITH STENT PLACEMENT;  Surgeon: [name], MD;  Location: [facility];  Service: Urology;  Laterality: Right;   CYSTOSCOPY/RETROGRADE/URETEROSCOPY Right 10/23/2017   Procedure: CYSTOSCOPY/RETROGRADE/URETEROSCOPY;  Surgeon: [name], MD;  Location: [facility];  Service: Urology;  Laterality: Right;   ESOPHAGOGASTRODUODENOSCOPY N/A 03/07/2021   Procedure: ESOPHAGOGASTRODUODENOSCOPY (EGD);  Surgeon: Tol[place], Benay Pike, MD;  Location: [facility];  Service: Gastroenterology;  Laterality: N/A;   RENAL BIOPSY     TEMPORARY DIALYSIS CATHETER N/A 12/07/2020   Procedure: TEMPORARY DIALYSIS CATHETER;  Surgeon: Katha Cabal, MD;  Location: [facility];  Service: Cardiovascular;  Laterality: N/A;    Prior to Admission medications   Medication Sig Start Date End Date Taking? Authorizing Provider  acetaminophen (TYLENOL) 500 MG tablet Take 500-1,000 mg by mouth every 6 (six)  hours as needed for mild pain or moderate pain.    [provider]  amLODipine (NORVASC) 10 MG tablet Take 1 tablet (10 mg total) by mouth daily. 01/16/21   Minna Merritts, MD  benzonatate (TESSALON) 200 MG capsule Take 1 capsule (200 mg total) by mouth 3 (three)  times daily as needed for cough. 03/18/21   Jon Billings, NP  calcium acetate (PHOSLO) 667 MG capsule Take 1,334 mg by mouth 3 (three) times daily. 12/03/20   [provider]  famotidine (PEPCID) 20 MG tablet Take 20 mg by mouth 2 (two) times daily as needed for heartburn or indigestion.    [provider]  ferrous sulfate 325 (65 FE) MG tablet Take 1 tablet (325 mg total) by mouth 3 (three) times daily with meals. 03/12/21   Lavina Hamman, MD  hydrALAZINE (APRESOLINE) 50 MG tablet Take 50 mg by mouth daily as needed (If diastolic bp is 90 or above).     [provider]  losartan (COZAAR) 50 MG tablet Take 2 tablets (100 mg total) by mouth daily. 03/12/21   Lavina Hamman, MD  metoprolol succinate (TOPROL-XL) 100 MG 24 hr tablet Take 1 tablet (100 mg total) by mouth daily. 03/18/21   Jon Billings, NP  ondansetron (ZOFRAN) 4 MG tablet Take 1 tablet (4 mg total) by mouth daily as needed for nausea or vomiting. 03/12/21 03/12/22  Lavina Hamman, MD  pantoprazole (PROTONIX) 40 MG tablet Take 1 tablet (40 mg total) by mouth 2 (two) times daily. 03/12/21   Lavina Hamman, MD  potassium chloride SA (KLOR-CON) 20 MEQ tablet Take 1 tablet (20 mEq total) by mouth daily for 3 days. 03/12/21 03/15/21  Lavina Hamman, MD  sertraline (ZOLOFT) 25 MG tablet Take 25 mg by mouth daily.  09/25/17   [provider]  sodium bicarbonate 650 MG tablet Take 1,300 mg by mouth 2 (two) times daily.     [provider]    Allergies Mushroom extract complex and Amoxicillin  Family History  Problem Relation Age of Onset   Diabetes Mother    Kidney disease Mother        mother on dialysis   CAD Mother    Diabetes Sister    CAD Sister     Social History Social History   Tobacco Use   Smoking status: Heavy Smoker    Packs/day: 1.00    Years: 20.00    Pack years: 20.00    Types: Cigarettes    Last attempt to quit: 08/22/2017    Years since quitting: 3.6   Smokeless  tobacco: Never  Vaping Use   Vaping Use: Never used  Substance Use Topics   Alcohol use: Not Currently    Comment: occ. alcohol use   Drug use: No    Review of Systems Constitutional: No fever. Eyes: No visual changes. ENT: No sore throat. Cardiovascular: Denies chest pain. Respiratory: + shortness of breath. Gastrointestinal: No nausea, vomiting, diarrhea. Genitourinary: Negative for dysuria. Musculoskeletal: Negative for back pain. Skin: Negative for rash. Neurological: Negative for focal weakness or numbness.  ____________________________________________   PHYSICAL EXAM:  VITAL SIGNS: ED Triage Vitals  Enc Vitals Group     BP 05/02/21 0541 (!) 193/132     Pulse Rate 05/02/21 0541 (!) 107     Resp 05/02/21 0541 (!) 34     Temp 05/02/21 0541 98 F (36.7 C)     Temp Source 05/02/21 0541 Oral  SpO2 05/02/21 0541 96 %     Weight 05/02/21 0542 141 lb 1.5 oz (64 kg)     Height 05/02/21 0542 '5\' 6"'$  (1.676 m)     Head Circumference --      Peak Flow --      Pain Score 05/02/21 0542 3     Pain Loc --      Pain Edu? --      Excl. in [place]? --    CONSTITUTIONAL: Alert and oriented and responds appropriately to questions.  Chronically ill-appearing HEAD: Normocephalic EYES: Conjunctivae clear, pupils appear equal, EOM appear intact ENT: normal nose; moist mucous membranes NECK: Supple, normal ROM CARD: Regular and tachycardic; S1 and S2 appreciated; no murmurs, no clicks, no rubs, no gallops RESP: Patient is tachypneic and in mild respiratory distress.  Diminished aeration at bases bilaterally.  No significant rhonchi, wheezing or rales appreciated.  Speaking truncated sentences.  No hypoxia on RA. ABD/GI: Normal bowel sounds; non-distended; soft, non-tender, no rebound, no guarding, no peritoneal signs, no hepatosplenomegaly, PD catheter noted without surrounding redness, warmth, bleeding or drainage BACK: The back appears normal EXT: Normal ROM in all joints; no deformity  noted, no edema; no cyanosis, no calf tenderness or calf swelling SKIN: Normal color for age and race; warm; no rash on exposed skin NEURO: Moves all extremities equally PSYCH: The patient's mood and manner are appropriate.  ____________________________________________   LABS (all labs ordered are listed, but only abnormal results are displayed)  Labs Reviewed  CBC WITH DIFFERENTIAL/PLATELET - Abnormal; Notable for the following components:      Result Value   WBC 12.2 (*)    RBC 2.45 (*)    Hemoglobin 7.7 (*)    HCT 23.2 (*)    RDW 15.7 (*)    Neutro Abs 9.6 (*)    Abs Immature Granulocytes 0.13 (*)    All other components within normal limits  BRAIN NATRIURETIC PEPTIDE - Abnormal; Notable for the following components:   B Natriuretic Peptide 4,453.2 (*)    All other components within normal limits  BLOOD GAS, ARTERIAL - Abnormal; Notable for the following components:   pH, Arterial 7.24 (*)    pCO2 arterial 28 (*)    pO2, Arterial 154 (*)    Bicarbonate 12.0 (*)    Acid-base deficit 14.1 (*)    All other components within normal limits  RESP PANEL BY RT-PCR (FLU A&B, COVID) ARPGX2  BASIC METABOLIC PANEL  CBG MONITORING, ED  TROPONIN I (HIGH SENSITIVITY)  TROPONIN I (HIGH SENSITIVITY)   ____________________________________________  EKG   Date: 05/02/2021 5:50 AM  Rate: 102  Rhythm: Sinus tachycardia  QRS Axis: normal  Intervals: Prolonged QT interval  ST/T Wave abnormalities: normal  Conduction Disutrbances: none  Narrative Interpretation: Sinus tachycardia, prolonged QT interval, peaked T waves in anterior leads, no change compared to previous EKG    ____________________________________________  RADIOLOGY I, Anglia Blakley, personally viewed and evaluated these images (plain radiographs) as part of my medical decision making, as well as reviewing the written report by the radiologist.  ED MD interpretation: Pulmonary edema.  Official radiology report(s): DG  Chest Port 1 View  Result Date: 05/02/2021 CLINICAL DATA:  55 year old male with shortness of breath. Onset of cough overnight. Smoker. EXAM: PORTABLE CHEST 1 VIEW COMPARISON:  Chest radiographs 04/29/2021 and earlier. FINDINGS: Portable AP upright view at 0552 hours. Chronic but increased bilateral pulmonary interstitial opacity, now more asymmetric and greater on the right. Stable borderline to mild cardiomegaly.  Other mediastinal contours are within normal limits. Visualized tracheal air column is within normal limits. No pneumothorax, consolidation or pleural effusion. No acute osseous abnormality identified. IMPRESSION: Increasing bilateral pulmonary interstitial opacity since 04/29/2021. Despite borderline to mild cardiomegaly the appearance favors acute viral/atypical respiratory infection over interstitial edema. Electronically Signed   By: Genevie Ann M.D.   On: 05/02/2021 06:11    ____________________________________________   PROCEDURES  Procedure(s) performed (including Critical Care):  Procedures  CRITICAL CARE Performed by: Cyril Mourning Olliver Boyadjian   Total critical care time: 65 minutes  Critical care time was exclusive of separately billable procedures and treating other patients.  Critical care was necessary to treat or prevent imminent or life-threatening deterioration.  Critical care was time spent personally by me on the following activities: development of treatment plan with patient and/or surrogate as well as nursing, discussions with consultants, evaluation of patient's response to treatment, examination of patient, obtaining history from patient or surrogate, ordering and performing treatments and interventions, ordering and review of laboratory studies, ordering and review of radiographic studies, pulse oximetry and re-evaluation of patient's condition.  ____________________________________________   INITIAL IMPRESSION / ASSESSMENT AND PLAN / ED COURSE  As part of my medical  decision making, I reviewed the following data within the [place] notes reviewed and incorporated, Labs reviewed , EKG interpreted , Old EKG reviewed, Radiograph reviewed , Discussed with admitting physician , and Notes from prior ED visits         Patient here with increasing shortness of breath.  Differential includes pneumonia, COVID-19, influenza, ARDS, pulmonary edema, PE, ACS, pneumothorax.  He is afebrile here but tachycardic and tachypneic.  Chest x-ray from 3 days ago showed pulmonary edema and pleural effusions.  He is on peritoneal dialysis.  We will repeat chest x-ray.  EKG shows no new ischemic change.  Labs pending.  Will place on BiPAP due to increased work of breathing.  ED PROGRESS  Patient improving clinically on BiPAP.  Chest x-ray shows pulmonary edema versus atypical infection.  Rectal temp here is 98.  ABG shows a respiratory acidosis which may be due to uremia from ESRD.  Normal glucose, doubt DKA.  Labs pending.  Patient will need admission.  Patient continues to be hypertensive.  Will give IV hydralazine and reassess.  May need infusion for HTN emergency.  Signed out to oncoming EDP.  I reviewed all nursing notes and pertinent previous records as available.  I have reviewed and interpreted any EKGs, lab and urine results, imaging (as available).  ____________________________________________   FINAL CLINICAL IMPRESSION(S) / ED DIAGNOSES  Final diagnoses:  Acute pulmonary edema (HCC)  Respiratory distress  Metabolic acidosis  Hypertensive emergency     ED Discharge Orders     None       *Please note:  MALIKHAI COOPERSMITH was evaluated in Emergency Department on 05/02/2021 for the symptoms described in the history of present illness. He was evaluated in the context of the global COVID-19 pandemic, which necessitated consideration that the patient might be at risk for infection with the SARS-CoV-2 virus that causes COVID-19.  Institutional protocols and algorithms that pertain to the evaluation of patients at risk for COVID-19 are in a state of rapid change based on information released by regulatory bodies including the CDC and federal and state organizations. These policies and algorithms were followed during the patient's care in the ED.  Some ED evaluations and interventions may be delayed as a result of limited staffing during  and the pandemic.*   Note:  This document was prepared using Dragon voice recognition software and may include unintentional dictation errors.    Tomeika Weinmann, Delice Bison, DO 05/02/21 [place], Delice Bison, DO 05/02/21 [place], Delice Bison, DO 05/02/21 [phone]

## 2021-05-02 NOTE — ED Notes (Signed)
RT making RN aware pt taken off BiPAP

## 2021-05-02 NOTE — ED Notes (Signed)
Pt on side of bed with BiPAP off c/o nausea. Pt oxygen sats 95-98% on RA but pt with increased WOB. MD made aware.

## 2021-05-02 NOTE — ED Notes (Signed)
RN still waiting for pharmacy to send lasix IVPB

## 2021-05-02 NOTE — ED Notes (Signed)
Pt wanting to trial 6L [place] while he eats

## 2021-05-02 NOTE — Progress Notes (Signed)
Pt taken off bipap, tolerating well on roomair, sats 100%, respiratory rate 16/min.

## 2021-05-02 NOTE — ED Notes (Signed)
Kelly RN aware of assigned bed 

## 2021-05-02 NOTE — ED Triage Notes (Signed)
Pt arrives via pov from home with c/o SOB. Pt reports being seen here on tue for similar symptoms. Since leaving SOB and cough increased starting last night. Denies n/v/d. RR in triage 34. Oxygen sat 96

## 2021-05-02 NOTE — ED Notes (Signed)
Pt placed back on BiPAP by RT

## 2021-05-02 NOTE — ED Notes (Signed)
Pt oxygen sats decreased to 89-90% on RA. Pt resting. Pt placed on 2L [place] with improvement to 95%.

## 2021-05-03 ENCOUNTER — Inpatient Hospital Stay: Admit: 2021-05-03 | Payer: Medicare Other

## 2021-05-03 DIAGNOSIS — N186 End stage renal disease: Secondary | ICD-10-CM | POA: Diagnosis not present

## 2021-05-03 DIAGNOSIS — I161 Hypertensive emergency: Secondary | ICD-10-CM | POA: Diagnosis not present

## 2021-05-03 DIAGNOSIS — N2581 Secondary hyperparathyroidism of renal origin: Secondary | ICD-10-CM | POA: Diagnosis not present

## 2021-05-03 DIAGNOSIS — R0602 Shortness of breath: Secondary | ICD-10-CM | POA: Diagnosis not present

## 2021-05-03 DIAGNOSIS — I10 Essential (primary) hypertension: Secondary | ICD-10-CM | POA: Diagnosis not present

## 2021-05-03 DIAGNOSIS — I5033 Acute on chronic diastolic (congestive) heart failure: Secondary | ICD-10-CM

## 2021-05-03 DIAGNOSIS — J9601 Acute respiratory failure with hypoxia: Secondary | ICD-10-CM | POA: Diagnosis present

## 2021-05-03 DIAGNOSIS — D631 Anemia in chronic kidney disease: Secondary | ICD-10-CM | POA: Diagnosis not present

## 2021-05-03 LAB — CBC
HCT: 19.1 % — ABNORMAL LOW (ref 39.0–52.0)
Hemoglobin: 6.4 g/dL — ABNORMAL LOW (ref 13.0–17.0)
MCH: 31.8 pg (ref 26.0–34.0)
MCHC: 33.5 g/dL (ref 30.0–36.0)
MCV: 95 fL (ref 80.0–100.0)
Platelets: 224 10*3/uL (ref 150–400)
RBC: 2.01 MIL/uL — ABNORMAL LOW (ref 4.22–5.81)
RDW: 15.8 % — ABNORMAL HIGH (ref 11.5–15.5)
WBC: 11 10*3/uL — ABNORMAL HIGH (ref 4.0–10.5)
nRBC: 0 % (ref 0.0–0.2)

## 2021-05-03 LAB — COMPREHENSIVE METABOLIC PANEL
ALT: 11 U/L (ref 0–44)
AST: 13 U/L — ABNORMAL LOW (ref 15–41)
Albumin: 2.7 g/dL — ABNORMAL LOW (ref 3.5–5.0)
Alkaline Phosphatase: 52 U/L (ref 38–126)
Anion gap: 20 — ABNORMAL HIGH (ref 5–15)
BUN: 97 mg/dL — ABNORMAL HIGH (ref 6–20)
CO2: 13 mmol/L — ABNORMAL LOW (ref 22–32)
Calcium: 8 mg/dL — ABNORMAL LOW (ref 8.9–10.3)
Chloride: 102 mmol/L (ref 98–111)
Creatinine, Ser: 19.78 mg/dL — ABNORMAL HIGH (ref 0.61–1.24)
GFR, Estimated: 2 mL/min — ABNORMAL LOW (ref >60)
Glucose, Bld: 112 mg/dL — ABNORMAL HIGH (ref 70–99)
Potassium: 4.5 mmol/L (ref 3.5–5.1)
Sodium: 135 mmol/L (ref 135–145)
Total Bilirubin: 0.7 mg/dL (ref 0.3–1.2)
Total Protein: 5.4 g/dL — ABNORMAL LOW (ref 6.5–8.1)

## 2021-05-03 LAB — STREP PNEUMONIAE URINARY ANTIGEN: Strep Pneumo Urinary Antigen: NEGATIVE

## 2021-05-03 MED ORDER — CLONIDINE HCL 0.1 MG PO TABS: 0.1000 mg | ORAL_TABLET | Freq: Two times a day (BID) | ORAL | 0 refills | Status: DC | PRN

## 2021-05-03 NOTE — Care Management Obs Status (Signed)
[place] NOTIFICATION   Patient Details  Name: Larry [name] MRN: [id number] Date of Birth: 07-18-1966   Medicare Observation Status Notification Given:  Yes    Adelene Amas, [place] 05/03/2021, 1:01 PM

## 2021-05-03 NOTE — ED Notes (Addendum)
Pt has tolerated 4LC since my shift start at 1900 on 05/02/2021

## 2021-05-03 NOTE — Progress Notes (Signed)
[place], [place] 05/03/21  Subjective:   Hospital day # 1  Patient known to our practice from previous admission as well as outpatient PD He states that he presented to the emergency room for cough.  He thinks it may be related to his excessive smoking.  He says he has not missed any of his PD treatments and has been taking his blood pressure medicines except last night because of excessive cough which made him have some nausea and vomiting. Patient feels much better today Blood pressure also is better controlled without the clonidine patch Patient did not get a room in the hospital therefore PD could not be performed   Objective:  Vital signs in last 24 hours:  Pulse Rate:  [74-103] 81 (08/12 1130) Resp:  [16-26] 19 (08/12 1130) BP: (135-199)/(82-114) 140/88 (08/12 1130) SpO2:  [90 %-100 %] 95 % (08/12 1140)  Weight change:  Filed Weights   05/02/21 0542  Weight: 64 kg    Intake/Output:    Intake/Output Summary (Last 24 hours) at 05/03/2021 1537 Last data filed at 05/03/2021 0834 Gross per 24 hour  Intake 112.48 ml  Output --  Net 112.48 ml     Physical Exam: General:  No acute distress, laying in the bed  HEENT  anicteric, moist oral mucous membrane  Pulm/lungs  normal breathing effort, lungs are clear to auscultation  CVS/Heart  regular rhythm, no rub or gallop  Abdomen:   Soft, nontender  Extremities:  No peripheral edema  Neurologic:  Alert, oriented, able to follow commands  Skin:  No acute rashes    Basic Metabolic Panel:  Recent Labs  Lab 04/29/21 1635 05/02/21 0650 05/03/21 0637  NA 137 138 135  K 5.3* 5.0 4.5  CL 106 104 102  CO2 15* 17* 13*  GLUCOSE 96 107* 112*  BUN 93* 87* 97*  CREATININE 19.19* 19.18* 19.78*  CALCIUM 8.3* 7.8* 8.0*      CBC: Recent Labs  Lab 04/29/21 1635 05/02/21 0650 05/03/21 0637  WBC 12.8* 12.2* 11.0*  NEUTROABS  --  9.6*  --   HGB 8.8* 7.7* 6.4*  HCT 27.1* 23.2* 19.1*  MCV  93.1 94.7 95.0  PLT 216 207 224       Lab Results  Component Value Date   HEPBSAG NON REACTIVE 12/07/2020      Microbiology:  Recent Results (from the past 240 hour(s))  Resp Panel by RT-PCR (Flu A&B, Covid) Nasopharyngeal Swab     Status: None   Collection Time: 04/29/21  7:50 PM   Specimen: Nasopharyngeal Swab; Nasopharyngeal(NP) swabs in vial transport medium  Result Value Ref Range Status   SARS Coronavirus 2 by RT PCR NEGATIVE NEGATIVE Final    Comment: (NOTE) SARS-CoV-2 target nucleic acids are NOT DETECTED.  The SARS-CoV-2 RNA is generally detectable in upper respiratory specimens during the acute phase of infection. The lowest concentration of SARS-CoV-2 viral copies this assay can detect is 138 copies/mL. A negative result does not preclude SARS-Cov-2 infection and should not be used as the sole basis for treatment or other patient management decisions. A negative result may occur with  improper specimen collection/handling, submission of specimen other than nasopharyngeal swab, presence of viral mutation(s) within the areas targeted by this assay, and inadequate number of viral copies(<138 copies/mL). A negative result must be combined with clinical observations, patient history, and epidemiological information. The expected result is Negative.  Fact Sheet for Patients:  EntrepreneurPulse.com.au  Fact Sheet for Healthcare Providers:  IncredibleEmployment.be  This test is no t yet approved or cleared by the [place] and  has been authorized for detection and/or diagnosis of SARS-CoV-2 by FDA under an Emergency Use Authorization (EUA). This EUA will remain  in effect (meaning this test can be used) for the duration of the COVID-19 declaration under Section 564(b)(1) of the Act, 21 U.S.C.section 360bbb-3(b)(1), unless the authorization is terminated  or revoked sooner.       Influenza A by PCR NEGATIVE NEGATIVE  Final   Influenza B by PCR NEGATIVE NEGATIVE Final    Comment: (NOTE) The Xpert Xpress SARS-CoV-2/FLU/RSV plus assay is intended as an aid in the diagnosis of influenza from Nasopharyngeal swab specimens and should not be used as a sole basis for treatment. Nasal washings and aspirates are unacceptable for Xpert Xpress SARS-CoV-2/FLU/RSV testing.  Fact Sheet for Patients: EntrepreneurPulse.com.au  Fact Sheet for Healthcare Providers: IncredibleEmployment.be  This test is not yet approved or cleared by the [place] FDA and has been authorized for detection and/or diagnosis of SARS-CoV-2 by FDA under an Emergency Use Authorization (EUA). This EUA will remain in effect (meaning this test can be used) for the duration of the COVID-19 declaration under Section 564(b)(1) of the Act, 21 U.S.C. section 360bbb-3(b)(1), unless the authorization is terminated or revoked.  Performed at [facility], [address] [place], [place] [address]   Resp Panel by RT-PCR (Flu A&B, Covid) Nasopharyngeal Swab     Status: None   Collection Time: 05/02/21  6:50 AM   Specimen: Nasopharyngeal Swab; Nasopharyngeal(NP) swabs in vial transport medium  Result Value Ref Range Status   SARS Coronavirus 2 by RT PCR NEGATIVE NEGATIVE Final    Comment: (NOTE) SARS-CoV-2 target nucleic acids are NOT DETECTED.  The SARS-CoV-2 RNA is generally detectable in upper respiratory specimens during the acute phase of infection. The lowest concentration of SARS-CoV-2 viral copies this assay can detect is 138 copies/mL. A negative result does not preclude SARS-Cov-2 infection and should not be used as the sole basis for treatment or other patient management decisions. A negative result may occur with  improper specimen collection/handling, submission of specimen other than nasopharyngeal swab, presence of viral mutation(s) within the areas targeted by this assay, and  inadequate number of viral copies(<138 copies/mL). A negative result must be combined with clinical observations, patient history, and epidemiological information. The expected result is Negative.  Fact Sheet for Patients:  EntrepreneurPulse.com.au  Fact Sheet for Healthcare Providers:  IncredibleEmployment.be  This test is no t yet approved or cleared by the [place] FDA and  has been authorized for detection and/or diagnosis of SARS-CoV-2 by FDA under an Emergency Use Authorization (EUA). This EUA will remain  in effect (meaning this test can be used) for the duration of the COVID-19 declaration under Section 564(b)(1) of the Act, 21 U.S.C.section 360bbb-3(b)(1), unless the authorization is terminated  or revoked sooner.       Influenza A by PCR NEGATIVE NEGATIVE Final   Influenza B by PCR NEGATIVE NEGATIVE Final    Comment: (NOTE) The Xpert Xpress SARS-CoV-2/FLU/RSV plus assay is intended as an aid in the diagnosis of influenza from Nasopharyngeal swab specimens and should not be used as a sole basis for treatment. Nasal washings and aspirates are unacceptable for Xpert Xpress SARS-CoV-2/FLU/RSV testing.  Fact Sheet for Patients: EntrepreneurPulse.com.au  Fact Sheet for Healthcare Providers: IncredibleEmployment.be  This test is not yet approved or cleared by the [place] and has been authorized for  detection and/or diagnosis of SARS-CoV-2 by FDA under an Emergency Use Authorization (EUA). This EUA will remain in effect (meaning this test can be used) for the duration of the COVID-19 declaration under Section 564(b)(1) of the Act, 21 U.S.C. section 360bbb-3(b)(1), unless the authorization is terminated or revoked.  Performed at [facility], [address] [place], [place] [address]   Expectorated Sputum Assessment w Gram Stain, Rflx to Resp Cult     Status: None    Collection Time: 05/02/21 10:16 PM   Specimen: Sputum  Result Value Ref Range Status   Specimen Description SPUTUM  Final   Special Requests NONE  Final   Sputum evaluation   Final    THIS SPECIMEN IS ACCEPTABLE FOR SPUTUM CULTURE Performed at [facility], [address], [place], [place] [address]    Report Status 05/02/2021 FINAL  Final  Culture, Respiratory w Gram Stain     Status: None (Preliminary result)   Collection Time: 05/02/21 10:16 PM   Specimen: SPU  Result Value Ref Range Status   Specimen Description   Final    SPUTUM Performed at [facility], [address], [place], [place] [address]    Special Requests   Final    NONE Reflexed from [id number] Performed at [facility], [address] [place], [place] [address]    Gram Stain   Final    FEW WBC PRESENT, PREDOMINANTLY MONONUCLEAR ABUNDANT SQUAMOUS EPITHELIAL CELLS PRESENT ABUNDANT GRAM POSITIVE COCCI MODERATE GRAM NEGATIVE RODS FEW GRAM VARIABLE ROD Performed at [facility], [place] [address], [place], [place] [address]    Culture PENDING  Incomplete   Report Status PENDING  Incomplete    Coagulation Studies: No results for input(s): LABPROT, INR in the last 72 hours.  Urinalysis: No results for input(s): COLORURINE, LABSPEC, PHURINE, GLUCOSEU, HGBUR, BILIRUBINUR, KETONESUR, PROTEINUR, UROBILINOGEN, NITRITE, LEUKOCYTESUR in the last 72 hours.  Invalid input(s): APPERANCEUR    Imaging: CT CHEST WO CONTRAST  Result Date: 05/02/2021 CLINICAL DATA:  Shortness of breath. EXAM: CT CHEST WITHOUT CONTRAST TECHNIQUE: Multidetector CT imaging of the chest was performed following the standard protocol without IV contrast. COMPARISON:  Chest radiograph 05/02/2021 and chest CT from 12/06/2020 FINDINGS: Cardiovascular: Coronary, aortic arch, and branch vessel atherosclerotic vascular disease. Mediastinum/Nodes: Scattered borderline prominent mediastinal lymph nodes including a 1.0 cm  right lower paratracheal node on image 45 series 2 (formerly 0.6 cm) and an AP window lymph node measuring 1.0 cm in short axis on image 62 series 2 (formerly 0.5 cm). Subcarinal node 1.2 cm in short axis image 79 series 2 (formerly 0.7 cm). Lungs/Pleura: Small bilateral pleural effusions with associated passive atelectasis. Biapical pleuroparenchymal scarring. Paraseptal emphysema. Secondary pulmonary lobular interstitial accentuation with patchy ground-glass opacities in both upper lobes medially but favoring the right upper lobe. Airway thickening is present, suggesting bronchitis or reactive airways disease. Faint tree-in-bud nodularity in the right lower lobe on image 79 series 3. Upper Abdomen: Cholecystectomy.  Abdominal aortic atherosclerosis. Musculoskeletal: Healing right anterolateral ninth rib fracture on image 150 series 3. IMPRESSION: 1. Although atypical infection is not completely excluded, the appearance of secondary pulmonary lobular interstitial accentuation peripherally, airway thickening, and small bilateral pleural effusions tends to favor cardiogenic edema as a cause for the findings on today's exam. Borderline enlarged lymph nodes are likely congested. Ground-glass opacities medially in the upper lobes probably representing airspace edema. 2.  Aortic Atherosclerosis (ICD10-I70.0).  Coronary atherosclerosis. 3. Healing right anterolateral ninth rib fracture. Electronically  Signed   By: Van Clines M.D.   On: 05/02/2021 12:14   DG Chest Port 1 View  Result Date: 05/02/2021 CLINICAL DATA:  55 year old male with shortness of breath. Onset of cough overnight. Smoker. EXAM: PORTABLE CHEST 1 VIEW COMPARISON:  Chest radiographs 04/29/2021 and earlier. FINDINGS: Portable AP upright view at 0552 hours. Chronic but increased bilateral pulmonary interstitial opacity, now more asymmetric and greater on the right. Stable borderline to mild cardiomegaly. Other mediastinal contours are within  normal limits. Visualized tracheal air column is within normal limits. No pneumothorax, consolidation or pleural effusion. No acute osseous abnormality identified. IMPRESSION: Increasing bilateral pulmonary interstitial opacity since 04/29/2021. Despite borderline to mild cardiomegaly the appearance favors acute viral/atypical respiratory infection over interstitial edema. Electronically Signed   By: Genevie Ann M.D.   On: 05/02/2021 06:11     Medications:    dialysis solution 1.5% low-MG/low-CA Stopped (05/02/21 2152)   furosemide Stopped (05/03/21 0834)    amLODipine  10 mg Oral Daily   calcium acetate  1,334 mg Oral TID with meals   cloNIDine  0.1 mg Transdermal Weekly   ferrous sulfate  325 mg Oral TID WC   gentamicin cream  1 application Topical Daily   heparin  5,000 Units Subcutaneous Q8H   hydrALAZINE  10 mg Intravenous Once   losartan  100 mg Oral Daily   metoprolol succinate  100 mg Oral Daily   nicotine  21 mg Transdermal Daily   ondansetron (ZOFRAN) IV  4 mg Intravenous Q6H   pantoprazole  40 mg Oral BID   sodium bicarbonate  1,300 mg Oral BID   zolpidem  5 mg Oral QHS   acetaminophen **OR** acetaminophen, albuterol, famotidine, heparin, hydrALAZINE, labetalol, morphine injection, ondansetron **OR** ondansetron (ZOFRAN) IV  Assessment/ Plan:  55 y.o. male with end-stage renal disease on peritoneal dialysis, hypertension, GERD, depression, anemia, atrial fibrillation, esophagitis   admitted on 05/02/2021 for Hypertensive emergency [I16.1] Uncontrolled hypertension [I10] Acute hypoxemic respiratory failure ([place]) [J96.01]  Outpatient PD prescription includes 4 cycles of 2300, 80% tidal, dry weight 65.5 kg  #Shortness of breath with malignant hypertension in setting of #end-stage renal disease -Blood pressure improved with IV nitroglycerin drip -Home medications resumed -Patient can have as needed clonidine for systolic blood pressure greater than 170  #Anemia of chronic  kidney disease Lab Results  Component Value Date   HGB 6.4 (L) 05/03/2021  Monitor closely. Currently getting 40,000 units of EPO monthly as outpatient Continues to have significant edema.  Denies any acute bleeding  #Secondary hyperparathyroidism  Lab Results  Component Value Date   CALCIUM 8.0 (L) 05/03/2021   CAION 1.07 (L) 10/03/2020   PHOS 6.4 (H) 03/12/2021  Outpatient phosphorus poorly uncontrolled at 12.3  PTH of 1329 Continue Sensipar Resume phoslo with diet       LOS: [place] 8/12/20223:37 PM  [place], [place]  Note: This note was prepared with Dragon dictation. Any transcription errors are unintentional

## 2021-05-03 NOTE — ED Notes (Signed)
Nephrology at bedside

## 2021-05-03 NOTE — ED Notes (Signed)
Patient ambulating in hallway with MD, steady gait, mild shortness of breath

## 2021-05-03 NOTE — Care Management CC44 (Signed)
Condition Code 44 Documentation Completed  Patient Details  Name: Larry [name] MRN: [id number] Date of Birth: 27-Jan-1966   Condition Code 44 given:  Yes Patient signature on Condition Code 44 notice:  Yes Documentation of 2 MD's agreement:  Yes Code 44 added to claim:  Yes    Adelene Amas, [place] 05/03/2021, 1:01 PM

## 2021-05-03 NOTE — ED Notes (Signed)
Secure chat sent to MD regarding patient being down graded to PCU bed.

## 2021-05-03 NOTE — Discharge Summary (Signed)
Physician Discharge Summary  Patient ID: Larry [name] MRN: [id number] DOB/AGE: Sep 04, 1966 55 y.o.  Admit date: 05/02/2021 Discharge date: 05/03/2021  Admission Diagnoses:  Discharge Diagnoses:  Active Problems:   Hypertensive emergency   Uncontrolled hypertension Acute on chronic diastolic congestive heart failure. End-stage renal disease  Anemia secondary to renal disease. Acute hypoxemic respiratory failure secondary to volume overload.  Discharged Condition: fair  Hospital Course:  Larry [name] is a 55 y.o. male with medical history significant of   55 y.o. male with medical history significant of ESRD-peritoneal dialysis, nephrotic syndrome, HTN, GERD, depression, anemia, PAF not on anticoagulant with recent interim history of admission  6/15/6-21 for uremia due to noncompliance with PD, HCAP,  gi bleed due to esophagitis who presents to ed for the second time in one weeks with increasing sob and each time found to be fluid overloaded. Patient presents today with continued worsening symptoms of sob despite treatment with lasix in ed few days ago.  Patient notes no associated chest pain, palpitations, n/v/d/fever/chills ,but notes cough and orthopnea.  Patient in ed found to have HTN Emergency with associated pulmonary edema and hypoxic respiratory failure requiring bipap. Patient was started on nitro drip, also IV Lasix.   Patient received peritoneal dialysis on Wednesday night, he was off BiPAP yesterday.  He did not receive dialysis last night, he will receive it tonight at home. Feels much better, he is off oxygen, he no longer has any short of breath.  He wished to go home.  I walked him for the entire block in the emergency room, he did not have any short of breath, oxygen saturation was 97% after walk.  I discussed with Dr. Candiss Norse, patient has been improved quickly clinically, currently he is medically stable to be discharged. His home medicines seem to be appropriate, we just  added clonidine 0.1 mg as needed for blood pressure over 170.   Consults: nephrology  Significant Diagnostic Studies:   Treatments: Peritoneal dialysis  Discharge Exam: Blood pressure 140/82, pulse 74, temperature 98 F (36.7 C), temperature source Oral, resp. rate 17, height '5\' 6"'$  (1.676 m), weight 64 kg, SpO2 93 %. General appearance: alert and cooperative Resp: clear to auscultation bilaterally Cardio: regular rate and rhythm, S1, S2 normal, no murmur, click, rub or gallop GI: soft, non-tender; bowel sounds normal; no masses,  no organomegaly Extremities: extremities normal, atraumatic, no cyanosis or edema  Disposition: Discharge disposition: 01-Home or Self Care      Discharge Instructions     Diet general   Complete by: As directed    Renal diet   Discharge wound care:   Complete by: As directed    Follow at HD   Increase activity slowly   Complete by: As directed       Allergies as of 05/03/2021       Reactions   Mushroom Extract Complex    Headache, migraines   Amoxicillin Nausea And Vomiting   Weakness  Has patient had a PCN reaction causing immediate rash, facial/tongue/throat swelling, SOB or lightheadedness with hypotension: No Has patient had a PCN reaction causing severe rash involving mucus membranes or skin necrosis: No Has patient had a PCN reaction that required hospitalization: No Has patient had a PCN reaction occurring within the last 10 years: No If all of the above answers are "NO", then may proceed with Cephalosporin use.        Medication List     STOP taking these medications  potassium chloride SA 20 MEQ tablet Commonly known as: KLOR-CON       TAKE these medications    acetaminophen 500 MG tablet Commonly known as: TYLENOL Take 500-1,000 mg by mouth every 6 (six) hours as needed for mild pain or moderate pain.   amLODipine 10 MG tablet Commonly known as: NORVASC Take 1 tablet (10 mg total) by mouth daily.    benzonatate 200 MG capsule Commonly known as: TESSALON Take 1 capsule (200 mg total) by mouth 3 (three) times daily as needed for cough.   calcium acetate 667 MG capsule Commonly known as: PHOSLO Take 1,334 mg by mouth 3 (three) times daily.   cloNIDine 0.1 MG tablet Commonly known as: CATAPRES Take 1 tablet (0.1 mg total) by mouth 2 (two) times daily as needed (sbp>170).   famotidine 20 MG tablet Commonly known as: PEPCID Take 20 mg by mouth 2 (two) times daily as needed for heartburn or indigestion.   ferrous sulfate 325 (65 FE) MG tablet Take 1 tablet (325 mg total) by mouth 3 (three) times daily with meals.   hydrALAZINE 50 MG tablet Commonly known as: APRESOLINE Take 50 mg by mouth daily as needed (If diastolic bp is 90 or above).   losartan 50 MG tablet Commonly known as: COZAAR Take 2 tablets (100 mg total) by mouth daily.   metoprolol succinate 100 MG 24 hr tablet Commonly known as: TOPROL-XL Take 1 tablet (100 mg total) by mouth daily.   ondansetron 4 MG tablet Commonly known as: Zofran Take 1 tablet (4 mg total) by mouth daily as needed for nausea or vomiting.   pantoprazole 40 MG tablet Commonly known as: PROTONIX Take 1 tablet (40 mg total) by mouth 2 (two) times daily.   sodium bicarbonate 650 MG tablet Take 1,300 mg by mouth 2 (two) times daily.   zolpidem 5 MG tablet Commonly known as: AMBIEN Take 5 mg by mouth at bedtime.               Discharge Care Instructions  (From admission, onward)           Start     Ordered   05/03/21 0000  Discharge wound care:       Comments: Follow at HD   05/03/21 1123            Follow-up Information     Jon Billings, NP Follow up in 1 week(s).   Specialty: Nurse Practitioner Contact information: [address] [place] [address] [phone]                32 minutes Signed: Sharen Hones 05/03/2021, 11:42 AM

## 2021-05-03 NOTE — ED Notes (Signed)
Pt provided ice chips as requested. Call bell in reach. Stretcher locked in lowest position

## 2021-05-05 LAB — LEGIONELLA PNEUMOPHILA SEROGP 1 UR AG: L. pneumophila Serogp 1 Ur Ag: NEGATIVE

## 2021-05-05 LAB — CULTURE, RESPIRATORY W GRAM STAIN: Culture: NORMAL

## 2021-05-08 ENCOUNTER — Other Ambulatory Visit: Payer: Self-pay

## 2021-05-08 DIAGNOSIS — F17210 Nicotine dependence, cigarettes, uncomplicated: Secondary | ICD-10-CM | POA: Insufficient documentation

## 2021-05-08 DIAGNOSIS — D631 Anemia in chronic kidney disease: Secondary | ICD-10-CM | POA: Insufficient documentation

## 2021-05-08 DIAGNOSIS — J40 Bronchitis, not specified as acute or chronic: Secondary | ICD-10-CM | POA: Insufficient documentation

## 2021-05-08 DIAGNOSIS — Z79899 Other long term (current) drug therapy: Secondary | ICD-10-CM | POA: Diagnosis not present

## 2021-05-08 DIAGNOSIS — I509 Heart failure, unspecified: Secondary | ICD-10-CM | POA: Diagnosis not present

## 2021-05-08 DIAGNOSIS — I132 Hypertensive heart and chronic kidney disease with heart failure and with stage 5 chronic kidney disease, or end stage renal disease: Secondary | ICD-10-CM | POA: Insufficient documentation

## 2021-05-08 DIAGNOSIS — Z992 Dependence on renal dialysis: Secondary | ICD-10-CM | POA: Diagnosis not present

## 2021-05-08 DIAGNOSIS — I130 Hypertensive heart and chronic kidney disease with heart failure and stage 1 through stage 4 chronic kidney disease, or unspecified chronic kidney disease: Secondary | ICD-10-CM | POA: Diagnosis not present

## 2021-05-08 DIAGNOSIS — J90 Pleural effusion, not elsewhere classified: Secondary | ICD-10-CM | POA: Diagnosis not present

## 2021-05-08 DIAGNOSIS — R0602 Shortness of breath: Secondary | ICD-10-CM | POA: Diagnosis not present

## 2021-05-08 DIAGNOSIS — Z20822 Contact with and (suspected) exposure to covid-19: Secondary | ICD-10-CM | POA: Diagnosis not present

## 2021-05-08 DIAGNOSIS — I110 Hypertensive heart disease with heart failure: Secondary | ICD-10-CM | POA: Diagnosis not present

## 2021-05-08 DIAGNOSIS — N186 End stage renal disease: Secondary | ICD-10-CM | POA: Insufficient documentation

## 2021-05-08 DIAGNOSIS — R69 Illness, unspecified: Secondary | ICD-10-CM | POA: Diagnosis not present

## 2021-05-08 NOTE — ED Triage Notes (Addendum)
Patient ambulatory to triage with steady gait, without difficulty or distress noted; pt reports SHOB, esp when lying supine, prod cough yellow/green sputum; st seen for same last wk; +smoker

## 2021-05-09 ENCOUNTER — Emergency Department: Payer: Medicare Other

## 2021-05-09 ENCOUNTER — Emergency Department
Admission: EM | Admit: 2021-05-09 | Discharge: 2021-05-09 | Disposition: A | Discharge status: Home / Self Care | Payer: Medicare Other | Attending: Emergency Medicine | Admitting: Emergency Medicine

## 2021-05-09 ENCOUNTER — Encounter: Payer: Self-pay | Admitting: Emergency Medicine

## 2021-05-09 DIAGNOSIS — J40 Bronchitis, not specified as acute or chronic: Secondary | ICD-10-CM | POA: Diagnosis not present

## 2021-05-09 DIAGNOSIS — I509 Heart failure, unspecified: Secondary | ICD-10-CM | POA: Diagnosis not present

## 2021-05-09 DIAGNOSIS — R0602 Shortness of breath: Secondary | ICD-10-CM | POA: Diagnosis not present

## 2021-05-09 DIAGNOSIS — N186 End stage renal disease: Secondary | ICD-10-CM

## 2021-05-09 DIAGNOSIS — J90 Pleural effusion, not elsewhere classified: Secondary | ICD-10-CM | POA: Diagnosis not present

## 2021-05-09 LAB — CBC WITH DIFFERENTIAL/PLATELET
Abs Immature Granulocytes: 0.09 10*3/uL — ABNORMAL HIGH (ref 0.00–0.07)
Basophils Absolute: 0.1 10*3/uL (ref 0.0–0.1)
Basophils Relative: 1 %
Eosinophils Absolute: 1.1 10*3/uL — ABNORMAL HIGH (ref 0.0–0.5)
Eosinophils Relative: 10 %
HCT: 22.4 % — ABNORMAL LOW (ref 39.0–52.0)
Hemoglobin: 7.5 g/dL — ABNORMAL LOW (ref 13.0–17.0)
Immature Granulocytes: 1 %
Lymphocytes Relative: 16 %
Lymphs Abs: 1.8 10*3/uL (ref 0.7–4.0)
MCH: 31.4 pg (ref 26.0–34.0)
MCHC: 33.5 g/dL (ref 30.0–36.0)
MCV: 93.7 fL (ref 80.0–100.0)
Monocytes Absolute: 1 10*3/uL (ref 0.1–1.0)
Monocytes Relative: 9 %
Neutro Abs: 6.9 10*3/uL (ref 1.7–7.7)
Neutrophils Relative %: 63 %
Platelets: 344 10*3/uL (ref 150–400)
RBC: 2.39 MIL/uL — ABNORMAL LOW (ref 4.22–5.81)
RDW: 15.9 % — ABNORMAL HIGH (ref 11.5–15.5)
WBC: 11 10*3/uL — ABNORMAL HIGH (ref 4.0–10.5)
nRBC: 0 % (ref 0.0–0.2)

## 2021-05-09 LAB — BASIC METABOLIC PANEL
Anion gap: 15 (ref 5–15)
BUN: 102 mg/dL — ABNORMAL HIGH (ref 6–20)
CO2: 17 mmol/L — ABNORMAL LOW (ref 22–32)
Calcium: 7 mg/dL — ABNORMAL LOW (ref 8.9–10.3)
Chloride: 105 mmol/L (ref 98–111)
Creatinine, Ser: 19.18 mg/dL — ABNORMAL HIGH (ref 0.61–1.24)
GFR, Estimated: 3 mL/min — ABNORMAL LOW (ref >60)
Glucose, Bld: 107 mg/dL — ABNORMAL HIGH (ref 70–99)
Potassium: 4.5 mmol/L (ref 3.5–5.1)
Sodium: 137 mmol/L (ref 135–145)

## 2021-05-09 LAB — RESP PANEL BY RT-PCR (FLU A&B, COVID) ARPGX2
Influenza A by PCR: NEGATIVE
Influenza B by PCR: NEGATIVE
SARS Coronavirus 2 by RT PCR: NEGATIVE

## 2021-05-09 LAB — TROPONIN I (HIGH SENSITIVITY)
Troponin I (High Sensitivity): 46 ng/L — ABNORMAL HIGH (ref <18)
Troponin I (High Sensitivity): 42 ng/L — ABNORMAL HIGH (ref <18)

## 2021-05-09 LAB — BRAIN NATRIURETIC PEPTIDE: B Natriuretic Peptide: 3629.9 pg/mL — ABNORMAL HIGH (ref 0.0–100.0)

## 2021-05-09 MED ORDER — IPRATROPIUM-ALBUTEROL 0.5-2.5 (3) MG/3ML IN SOLN
3.0000 mL | Freq: Once | RESPIRATORY_TRACT | Status: AC
  Administered 2021-05-09: 3 mL via RESPIRATORY_TRACT
  Filled 2021-05-09: qty 3

## 2021-05-09 MED ORDER — PREDNISONE 20 MG PO TABS: 40.0000 mg | ORAL_TABLET | Freq: Every day | ORAL | 0 refills | Status: AC

## 2021-05-09 MED ORDER — FUROSEMIDE 10 MG/ML IJ SOLN
100.0000 mg | Freq: Once | INTRAVENOUS | Status: AC
  Administered 2021-05-09: 100 mg via INTRAVENOUS
  Filled 2021-05-09: qty 10

## 2021-05-09 MED ORDER — ALBUTEROL SULFATE HFA 108 (90 BASE) MCG/ACT IN AERS
2.0000 | INHALATION_SPRAY | Freq: Once | RESPIRATORY_TRACT | Status: AC
  Administered 2021-05-09: 2 via RESPIRATORY_TRACT
  Filled 2021-05-09: qty 6.7

## 2021-05-09 MED ORDER — AZITHROMYCIN 250 MG PO TABS: ORAL_TABLET | ORAL | 0 refills | Status: AC

## 2021-05-09 MED ORDER — METHYLPREDNISOLONE SODIUM SUCC 125 MG IJ SOLR
125.0000 mg | Freq: Once | INTRAMUSCULAR | Status: AC
  Administered 2021-05-09: 125 mg via INTRAVENOUS
  Filled 2021-05-09: qty 2

## 2021-05-09 NOTE — ED Notes (Signed)
Pt ambulated in his room and his O2 sat remained between 93-95% - Pt denies SOB or CP. MD aware.

## 2021-05-09 NOTE — ED Notes (Signed)
E-signature pad unavailable - Pt verbalized understanding of D/C information - no additional concerns at this time.  

## 2021-05-09 NOTE — Discharge Instructions (Addendum)
Take the steroids and antibiotics to help for possible chest infection and use the inhaler every 6 hours.  Return to the ER if you develop fevers, worsening shortness of breath or any other concerns.  We have referred you to a heart failure clinic in case some of this could be related to some fluid this may can monitor you for additional doses of Lasix.  Continue to take your torsemide.  Return to the ER if you develop worsening symptoms or any other concern

## 2021-05-09 NOTE — ED Provider Notes (Signed)
[facility] Emergency Department Provider Note  ____________________________________________   Event Date/Time   First MD Initiated Contact with Patient 05/09/21 0100     (approximate)  I have reviewed the triage vital signs and the nursing notes.   HISTORY  Chief Complaint Shortness of Breath    HPI Larry [name] is a 55 y.o. male with ESRD on dialysis nightly who comes in with concerns for shortness of breath.  Patient reports he has been compliant with his peritoneal dialysis.  States that he has been more short of breath and coughing up a yellow-greenish sputum.  His shortness of breath is moderate, worse with exertion, better at rest.  Patient reports that he does smoke.          Past Medical History:  Diagnosis Date   Anemia    ESRD on peritoneal dialysis [facility])    Hypertension    Nephrotic syndrome     Patient Active Problem List   Diagnosis Date Noted   Acute hypoxemic respiratory failure ([place]) 05/03/2021   Hypertensive emergency 05/02/2021   Uncontrolled hypertension 05/02/2021   Uremia 03/06/2021   Hyperkalemia 03/06/2021   Coffee ground emesis 03/06/2021   Sepsis ([place]) 03/06/2021   HCAP (healthcare-associated pneumonia) 03/06/2021   Anemia in ESRD (end-stage renal disease) ([place]) 03/06/2021   GERD (gastroesophageal reflux disease) 03/06/2021   Pericarditis 12/09/2020   Uremia syndrome 12/09/2020   Paroxysmal atrial fibrillation (HCC)    HTN (hypertension) 12/06/2020   Nicotine dependence 12/06/2020   Intractable vomiting with nausea 12/06/2020   ESRD on peritoneal dialysis ([place]) 10/29/2020   Renal dialysis device, implant, or graft complication [id number]   Open displaced fracture of proximal phalanx of left thumb 02/29/2020   Anemia of chronic kidney failure, stage 5 ([place]) 12/28/2017   Hydronephrosis 10/15/2017   AKI (acute kidney injury) ([place]) 09/30/2017    Past Surgical History:  Procedure Laterality Date   CAPD  INSERTION N/A 01/06/2018   Procedure: LAPAROSCOPIC INSERTION CONTINUOUS AMBULATORY PERITONEAL DIALYSIS  (CAPD) CATHETER;  Surgeon: Katha Cabal, MD;  Location: [facility] ORS;  Service: Vascular;  Laterality: N/A;   CHOLECYSTECTOMY     CYSTOSCOPY W/ URETERAL STENT REMOVAL Right 10/23/2017   Procedure: CYSTOSCOPY WITH STENT REPLACEMENT;  Surgeon: [name], MD;  Location: [facility] ORS;  Service: Urology;  Laterality: Right;   CYSTOSCOPY WITH STENT PLACEMENT Right 09/30/2017   Procedure: CYSTOSCOPY WITH STENT PLACEMENT;  Surgeon: [name], MD;  Location: [facility] ORS;  Service: Urology;  Laterality: Right;   CYSTOSCOPY/RETROGRADE/URETEROSCOPY Right 10/23/2017   Procedure: CYSTOSCOPY/RETROGRADE/URETEROSCOPY;  Surgeon: [name], MD;  Location: [facility] ORS;  Service: Urology;  Laterality: Right;   ESOPHAGOGASTRODUODENOSCOPY N/A 03/07/2021   Procedure: ESOPHAGOGASTRODUODENOSCOPY (EGD);  Surgeon: Toledo, Benay Pike, MD;  Location: [facility] ENDOSCOPY;  Service: Gastroenterology;  Laterality: N/A;   RENAL BIOPSY     TEMPORARY DIALYSIS CATHETER N/A 12/07/2020   Procedure: TEMPORARY DIALYSIS CATHETER;  Surgeon: Katha Cabal, MD;  Location: [place] [facility];  Service: Cardiovascular;  Laterality: N/A;    Prior to Admission medications   Medication Sig Start Date End Date Taking? Authorizing Provider  acetaminophen (TYLENOL) 500 MG tablet Take 500-1,000 mg by mouth every 6 (six) hours as needed for mild pain or moderate pain.    [provider]  amLODipine (NORVASC) 10 MG tablet Take 1 tablet (10 mg total) by mouth daily. 01/16/21   Minna Merritts, MD  benzonatate (TESSALON) 200 MG capsule Take 1 capsule (200 mg total)  by mouth 3 (three) times daily as needed for cough. 03/18/21   Jon Billings, NP  calcium acetate (PHOSLO) 667 MG capsule Take 1,334 mg by mouth 3 (three) times daily. 12/03/20   [provider]  cloNIDine (CATAPRES) 0.1 MG tablet Take 1 tablet (0.1 mg total) by  mouth 2 (two) times daily as needed (sbp>170). 05/03/21   Sharen Hones, MD  famotidine (PEPCID) 20 MG tablet Take 20 mg by mouth 2 (two) times daily as needed for heartburn or indigestion.    [provider]  ferrous sulfate 325 (65 FE) MG tablet Take 1 tablet (325 mg total) by mouth 3 (three) times daily with meals. 03/12/21   Lavina Hamman, MD  hydrALAZINE (APRESOLINE) 50 MG tablet Take 50 mg by mouth daily as needed (If diastolic bp is 90 or above).     [provider]  losartan (COZAAR) 50 MG tablet Take 2 tablets (100 mg total) by mouth daily. 03/12/21   Lavina Hamman, MD  metoprolol succinate (TOPROL-XL) 100 MG 24 hr tablet Take 1 tablet (100 mg total) by mouth daily. 03/18/21   Jon Billings, NP  ondansetron (ZOFRAN) 4 MG tablet Take 1 tablet (4 mg total) by mouth daily as needed for nausea or vomiting. 03/12/21 03/12/22  Lavina Hamman, MD  pantoprazole (PROTONIX) 40 MG tablet Take 1 tablet (40 mg total) by mouth 2 (two) times daily. 03/12/21   Lavina Hamman, MD  sodium bicarbonate 650 MG tablet Take 1,300 mg by mouth 2 (two) times daily.     [provider]  zolpidem (AMBIEN) 5 MG tablet Take 5 mg by mouth at bedtime. 04/26/21   [provider]    Allergies Mushroom extract complex and Amoxicillin  Family History  Problem Relation Age of Onset   Diabetes Mother    Kidney disease Mother        mother on dialysis   CAD Mother    Diabetes Sister    CAD Sister     Social History Social History   Tobacco Use   Smoking status: Heavy Smoker    Packs/day: 1.00    Years: 20.00    Pack years: 20.00    Types: Cigarettes    Last attempt to quit: 08/22/2017    Years since quitting: 3.7   Smokeless tobacco: Never  Vaping Use   Vaping Use: Never used  Substance Use Topics   Alcohol use: Not Currently    Comment: occ. alcohol use   Drug use: No      Review of Systems Constitutional: No fever/chills Eyes: No visual changes. ENT: No sore  throat. Cardiovascular: No chest pain Respiratory: Positive for SOB, positive cough Gastrointestinal: No abdominal pain.  No nausea, no vomiting.  No diarrhea.  No constipation. Genitourinary: Negative for dysuria. Musculoskeletal: Negative for back pain. Skin: Negative for rash. Neurological: Negative for headaches, focal weakness or numbness. All other ROS negative ____________________________________________   PHYSICAL EXAM:  VITAL SIGNS: ED Triage Vitals  Enc Vitals Group     BP 05/09/21 0006 (!) 181/105     Pulse Rate 05/09/21 0006 82     Resp 05/09/21 0006 (!) 24     Temp 05/09/21 0006 98.4 F (36.9 C)     Temp Source 05/09/21 0006 Oral     SpO2 05/09/21 0006 98 %     Weight 05/08/21 2357 150 lb (68 kg)     Height 05/08/21 2357 '5\' 6"'$  (1.676 m)     Head  Circumference --      Peak Flow --      Pain Score 05/08/21 2357 0     Pain Loc --      Pain Edu? --      Excl. in [place]? --     Constitutional: Alert and oriented. Well appearing and in no acute distress. Eyes: Conjunctivae are normal. EOMI. Head: Atraumatic. Nose: No congestion/rhinnorhea. Mouth/Throat: Mucous membranes are moist.   Neck: No stridor. Trachea Midline. FROM Cardiovascular: Normal rate, regular rhythm. Grossly normal heart sounds.  Good peripheral circulation. Respiratory: Wheezing, no increased work of breathing Gastrointestinal: Soft and nontender. No distention. No abdominal bruits.  Peritoneal dialysis Musculoskeletal: No lower extremity tenderness nor edema.  No joint effusions. Neurologic:  Normal speech and language. No gross focal neurologic deficits are appreciated.  Skin:  Skin is warm, dry and intact. No rash noted. Psychiatric: Mood and affect are normal. Speech and behavior are normal. GU: Deferred   ____________________________________________   LABS (all labs ordered are listed, but only abnormal results are displayed)  Labs Reviewed  CBC WITH DIFFERENTIAL/PLATELET - Abnormal;  Notable for the following components:      Result Value   WBC 11.0 (*)    RBC 2.39 (*)    Hemoglobin 7.5 (*)    HCT 22.4 (*)    RDW 15.9 (*)    Eosinophils Absolute 1.1 (*)    Abs Immature Granulocytes 0.09 (*)    All other components within normal limits  BASIC METABOLIC PANEL - Abnormal; Notable for the following components:   CO2 17 (*)    Glucose, Bld 107 (*)    BUN 102 (*)    Creatinine, Ser 19.18 (*)    Calcium 7.0 (*)    GFR, Estimated 3 (*)    All other components within normal limits  BRAIN NATRIURETIC PEPTIDE - Abnormal; Notable for the following components:   B Natriuretic Peptide 3,629.9 (*)    All other components within normal limits  TROPONIN I (HIGH SENSITIVITY) - Abnormal; Notable for the following components:   Troponin I (High Sensitivity) 46 (*)    All other components within normal limits  RESP PANEL BY RT-PCR (FLU A&B, COVID) ARPGX2  TROPONIN I (HIGH SENSITIVITY)   ____________________________________________   ED ECG REPORT I, Vanessa [place], the attending physician, personally viewed and interpreted this ECG.  Normal sinus rate of 77, no ST elevation, T wave version in lead III, aVF, V5 and V6 ____________________________________________  RADIOLOGY Robert Bellow, personally viewed and evaluated these images (plain radiographs) as part of my medical decision making, as well as reviewing the written report by the radiologist.  ED MD interpretation: Mild CHF  Official radiology report(s): DG Chest 2 View  Result Date: 05/09/2021 CLINICAL DATA:  Shortness of breath EXAM: CHEST - 2 VIEW COMPARISON:  05/02/2021 FINDINGS: Cardiac shadow is stable. Mild vascular congestion is again seen and stable. Very mild interstitial edema is noted. No focal infiltrate is seen. Small effusions are noted. IMPRESSION: Mild CHF. Electronically Signed   By: Inez Catalina M.D.   On: 05/09/2021 00:22     ____________________________________________   PROCEDURES  Procedure(s) performed (including Critical Care):  .1-3 Lead EKG Interpretation  Date/Time: 05/09/2021 1:47 AM Performed by: Vanessa [place], MD Authorized by: Vanessa [place], MD     Interpretation: normal     ECG rate:  70s   ECG rate assessment: normal     Rhythm: sinus rhythm     Ectopy:  none     Conduction: normal   .Critical Care  Date/Time: 05/09/2021 1:47 AM Performed by: Vanessa [place], MD Authorized by: Vanessa [place], MD   Critical care provider statement:    Critical care time (minutes):  35   Critical care was necessary to treat or prevent imminent or life-threatening deterioration of the following conditions: CHF needing IV lasix.   Critical care was time spent personally by me on the following activities:  Discussions with consultants, evaluation of patient's response to treatment, examination of patient, ordering and performing treatments and interventions, ordering and review of laboratory studies, ordering and review of radiographic studies, pulse oximetry, re-evaluation of patient's condition, obtaining history from patient or surrogate and review of old charts   ____________________________________________   [facility] / ED COURSE   Larry [name] was evaluated in Emergency Department on 05/09/2021 for the symptoms described in the history of present illness. He was evaluated in the context of the global COVID-19 pandemic, which necessitated consideration that the patient might be at risk for infection with the SARS-CoV-2 virus that causes COVID-19. Institutional protocols and algorithms that pertain to the evaluation of patients at risk for COVID-19 are in a state of rapid change based on information released by regulatory bodies including the CDC and federal and state organizations. These policies and algorithms were followed during the patient's care in the ED.     Pt  presents with SOB.  Given history of peritoneal dialysis will get chest x-ray to evaluate for CHF.  Patient does have some wheezing on exam and does report that he has a history of smoking so we will trial some duo nebs and steroids differential includes: PNA-will get xray to evaluation Anemia-CBC to evaluate ACS- will get trops Arrhythmia-Will get EKG and keep on monitor.  COVID- will get testing per algorithm. PE-lower suspicion given no risk factors and other cause more likely  Chest x-ray shows some pulmonary edema.  Patient's had previous admissions where he is required IV diuresis with 100 mg of Lasix.  We will give a dose here given patient still makes urine.  BNP is elevated but downtrending from 7 days ago.  His hemoglobin is around his baseline's, BUN is elevated but he has not had his dialysis yet tonight.  Initial troponin was 46 but he is got some elevation previously we will get repeat I suspect just from his CKD  Repeat troponin is stable.  His COVID test is negative.  Patient has had some urine output with the Lasix.  Patient states that he is feels better with the steroids and the breathing treatment.  He on repeat evaluation no longer has wheezing.  I suspect that he might have some undiagnosed COPD given significant smoking history.  We will send him home with steroids, antibiotics, inhaler.  We will also have him follow-up with the CHF clinic given concern for some edema.  Discussed with patient that he needs to do dialysis when he gets home due to his elevated BUN.  He states that he normally runs 7 PM to 5 AM.  He understands that he needs to go home run for 8 hours and then run again at 7 PM to help prevent this from going up.  He denies any confusion or fatigue that would require admission for emergent dialysis.  He has had previous days where his BUN has been similar in the 80s to 90s.  Patient was ambulatory with saturations from 93  to 95% and stated that he did not have any  shortness of breath therefore patient is stable for discharge home        ____________________________________________   FINAL CLINICAL IMPRESSION(S) / ED DIAGNOSES   Final diagnoses:  Bronchitis  Acute congestive heart failure, unspecified heart failure type ([place])     MEDICATIONS GIVEN DURING THIS VISIT:  Medications  furosemide (LASIX) 100 mg in dextrose 5 % 50 mL IVPB (has no administration in time range)  ipratropium-albuterol (DUONEB) 0.5-2.5 (3) MG/3ML nebulizer solution 3 mL (has no administration in time range)  ipratropium-albuterol (DUONEB) 0.5-2.5 (3) MG/3ML nebulizer solution 3 mL (has no administration in time range)  ipratropium-albuterol (DUONEB) 0.5-2.5 (3) MG/3ML nebulizer solution 3 mL (has no administration in time range)  methylPREDNISolone sodium succinate (SOLU-MEDROL) 125 mg/2 mL injection 125 mg (has no administration in time range)     ED Discharge Orders          Ordered    AMB referral to CHF clinic        05/09/21 0500    azithromycin (ZITHROMAX Z-PAK) 250 MG tablet        05/09/21 0503    predniSONE (DELTASONE) 20 MG tablet  Daily with breakfast        05/09/21 0503             Note:  This document was prepared using Dragon voice recognition software and may include unintentional dictation errors.   Vanessa [place], MD 05/09/21 [phone]

## 2021-05-09 NOTE — ED Provider Notes (Signed)
HPI: Pt is a 55 y.o. male who presents with complaints of sob   The patient p/w  sob and cough   ROS: Denies fever, chest pain, vomiting  Past Medical History:  Diagnosis Date   Anemia    ESRD on peritoneal dialysis ([place])    Hypertension    Nephrotic syndrome    Vitals:   05/09/21 0006  BP: (!) 181/105  Pulse: 82  Resp: (!) 24  Temp: 98.4 F (36.9 C)  SpO2: 98%    Focused Physical Exam: Gen: No acute distress Head: atraumatic, normocephalic Eyes: Extraocular movements grossly intact; conjunctiva clear CV: RRR Lung: No increased WOB, no stridor, coughing  GI: ND, no obvious masses Neuro: Alert and awake  Medical Decision Making and Plan: Given the patient's initial medical screening exam, the following diagnostic evaluation has been ordered. The patient will be placed in the appropriate treatment space, once one is available, to complete the evaluation and treatment. I have discussed the plan of care with the patient and I have advised the patient that an ED physician or mid-level practitioner will reevaluate their condition after the test results have been received, as the results may give them additional insight into the type of treatment they may need.   Diagnostics: labs, covid swab   Treatments: none immediately   Vanessa [place], MD 05/09/21 0010

## 2021-05-13 ENCOUNTER — Other Ambulatory Visit: Payer: Self-pay

## 2021-05-13 DIAGNOSIS — Z79899 Other long term (current) drug therapy: Secondary | ICD-10-CM | POA: Diagnosis not present

## 2021-05-13 DIAGNOSIS — R69 Illness, unspecified: Secondary | ICD-10-CM | POA: Diagnosis not present

## 2021-05-13 DIAGNOSIS — I120 Hypertensive chronic kidney disease with stage 5 chronic kidney disease or end stage renal disease: Secondary | ICD-10-CM | POA: Diagnosis not present

## 2021-05-13 DIAGNOSIS — Z992 Dependence on renal dialysis: Secondary | ICD-10-CM | POA: Diagnosis not present

## 2021-05-13 DIAGNOSIS — R0981 Nasal congestion: Secondary | ICD-10-CM | POA: Diagnosis not present

## 2021-05-13 DIAGNOSIS — R609 Edema, unspecified: Secondary | ICD-10-CM | POA: Diagnosis not present

## 2021-05-13 DIAGNOSIS — F17210 Nicotine dependence, cigarettes, uncomplicated: Secondary | ICD-10-CM | POA: Insufficient documentation

## 2021-05-13 DIAGNOSIS — N186 End stage renal disease: Secondary | ICD-10-CM | POA: Diagnosis not present

## 2021-05-13 LAB — CBC WITH DIFFERENTIAL/PLATELET
Abs Immature Granulocytes: 0.17 10*3/uL — ABNORMAL HIGH (ref 0.00–0.07)
Basophils Absolute: 0 10*3/uL (ref 0.0–0.1)
Basophils Relative: 0 %
Eosinophils Absolute: 0.4 10*3/uL (ref 0.0–0.5)
Eosinophils Relative: 3 %
HCT: 21.6 % — ABNORMAL LOW (ref 39.0–52.0)
Hemoglobin: 7.1 g/dL — ABNORMAL LOW (ref 13.0–17.0)
Immature Granulocytes: 1 %
Lymphocytes Relative: 20 %
Lymphs Abs: 2.3 10*3/uL (ref 0.7–4.0)
MCH: 31.1 pg (ref 26.0–34.0)
MCHC: 32.9 g/dL (ref 30.0–36.0)
MCV: 94.7 fL (ref 80.0–100.0)
Monocytes Absolute: 1.2 10*3/uL — ABNORMAL HIGH (ref 0.1–1.0)
Monocytes Relative: 10 %
Neutro Abs: 7.8 10*3/uL — ABNORMAL HIGH (ref 1.7–7.7)
Neutrophils Relative %: 66 %
Platelets: 345 10*3/uL (ref 150–400)
RBC: 2.28 MIL/uL — ABNORMAL LOW (ref 4.22–5.81)
RDW: 15.2 % (ref 11.5–15.5)
WBC: 11.9 10*3/uL — ABNORMAL HIGH (ref 4.0–10.5)
nRBC: 0 % (ref 0.0–0.2)

## 2021-05-13 LAB — BASIC METABOLIC PANEL
Anion gap: 19 — ABNORMAL HIGH (ref 5–15)
BUN: 132 mg/dL — ABNORMAL HIGH (ref 6–20)
CO2: 16 mmol/L — ABNORMAL LOW (ref 22–32)
Calcium: 7.4 mg/dL — ABNORMAL LOW (ref 8.9–10.3)
Chloride: 101 mmol/L (ref 98–111)
Creatinine, Ser: 18.54 mg/dL — ABNORMAL HIGH (ref 0.61–1.24)
GFR, Estimated: 3 mL/min — ABNORMAL LOW (ref >60)
Glucose, Bld: 90 mg/dL (ref 70–99)
Potassium: 4.9 mmol/L (ref 3.5–5.1)
Sodium: 136 mmol/L (ref 135–145)

## 2021-05-13 NOTE — ED Triage Notes (Signed)
Pt states that he has had some swelling in his bilateral lower leg that started yesterday. Pt states he was seen here last week and put on prednisone for fluid around his lungs. Pt is dialysis pt, had dialysis last this evening.

## 2021-05-14 ENCOUNTER — Emergency Department
Admission: EM | Admit: 2021-05-14 | Discharge: 2021-05-14 | Disposition: A | Discharge status: Home / Self Care | Payer: Medicare Other | Attending: Emergency Medicine | Admitting: Emergency Medicine

## 2021-05-14 DIAGNOSIS — R609 Edema, unspecified: Secondary | ICD-10-CM | POA: Diagnosis not present

## 2021-05-14 DIAGNOSIS — R0981 Nasal congestion: Secondary | ICD-10-CM

## 2021-05-14 MED ORDER — FUROSEMIDE 10 MG/ML IJ SOLN
40.0000 mg | Freq: Once | INTRAMUSCULAR | Status: AC
  Administered 2021-05-14: 40 mg via INTRAMUSCULAR
  Filled 2021-05-14: qty 4

## 2021-05-14 NOTE — ED Provider Notes (Signed)
[facility] Emergency Department Provider Note  ____________________________________________   Event Date/Time   First MD Initiated Contact with Patient 05/14/21 0136     (approximate)  I have reviewed the triage vital signs and the nursing notes.   HISTORY  Chief Complaint Leg Swelling    HPI Larry [name] is a 55 y.o. male with history of hypertension, end-stage renal disease due to nephrotic syndrome on peritoneal dialysis who presents to the emergency department with complaints of bilateral leg swelling today.  No pain.  He denies any chest pain or shortness of breath.  States "if I just get a dose of Lasix I think I will feel better".  States he still makes urine.  States he did peritoneal dialysis prior to arrival.  Also reports nasal congestion and sinus drainage down the back of his throat for the past 4 to 5 days.  No fevers, productive cough.  Declines COVID testing today.  States multiple recent negative COVID test.        Past Medical History:  Diagnosis Date   Anemia    ESRD on peritoneal dialysis ([place])    Hypertension    Nephrotic syndrome     Patient Active Problem List   Diagnosis Date Noted   Acute hypoxemic respiratory failure ([place]) 05/03/2021   Hypertensive emergency 05/02/2021   Uncontrolled hypertension 05/02/2021   Uremia 03/06/2021   Hyperkalemia 03/06/2021   Coffee ground emesis 03/06/2021   Sepsis ([place]) 03/06/2021   HCAP (healthcare-associated pneumonia) 03/06/2021   Anemia in ESRD (end-stage renal disease) ([place]) 03/06/2021   GERD (gastroesophageal reflux disease) 03/06/2021   Pericarditis 12/09/2020   Uremia syndrome 12/09/2020   Paroxysmal atrial fibrillation (HCC)    HTN (hypertension) 12/06/2020   Nicotine dependence 12/06/2020   Intractable vomiting with nausea 12/06/2020   ESRD on peritoneal dialysis ([place]) 10/29/2020   Renal dialysis device, implant, or graft complication [id number]   Open displaced  fracture of proximal phalanx of left thumb 02/29/2020   Anemia of chronic kidney failure, stage 5 ([place]) 12/28/2017   Hydronephrosis 10/15/2017   AKI (acute kidney injury) ([place]) 09/30/2017    Past Surgical History:  Procedure Laterality Date   CAPD INSERTION N/A 01/06/2018   Procedure: LAPAROSCOPIC INSERTION CONTINUOUS AMBULATORY PERITONEAL DIALYSIS  (CAPD) CATHETER;  Surgeon: Katha Cabal, MD;  Location: [facility] ORS;  Service: Vascular;  Laterality: N/A;   CHOLECYSTECTOMY     CYSTOSCOPY W/ URETERAL STENT REMOVAL Right 10/23/2017   Procedure: CYSTOSCOPY WITH STENT REPLACEMENT;  Surgeon: [name], MD;  Location: [facility] OR[facility];  Service: Urology;  Laterality: Right;   CYSTOSCOPY WITH STENT PLACEMENT Right 09/30/2017   Procedure: CYSTOSCOPY WITH STENT PLACEMENT;  Surgeon: [name], MD;  Location: [facility];  Service: Urology;  Laterality: Right;   CYSTOSCOPY/RETROGRADE/URETEROSCOPY Right 10/23/2017   Procedure: CYSTOSCOPY/RETROGRADE/URETEROSCOPY;  Surgeon: [name], MD;  Location: [facility];  Service: Urology;  Laterality: Right;   ESOPHAGOGASTRODUODENOSCOPY N/A 03/07/2021   Procedure: ESOPHAGOGASTRODUODENOSCOPY (EGD);  Surgeon: Toledo, Benay Pike, MD;  Location: [facility];  Service: Gastroenterology;  Laterality: N/A;   RENAL BIOPSY     TEMPORARY DIALYSIS CATHETER N/A 12/07/2020   Procedure: TEMPORARY DIALYSIS CATHETER;  Surgeon: Katha Cabal, MD;  Location: [facility];  Service: Cardiovascular;  Laterality: N/A;    Prior to Admission medications   Medication Sig Start Date End Date Taking? Authorizing Provider  acetaminophen (TYLENOL) 500 MG tablet Take 500-1,000 mg by mouth every 6 (six) hours as needed for  mild pain or moderate pain.    [provider]  amLODipine (NORVASC) 10 MG tablet Take 1 tablet (10 mg total) by mouth daily. 01/16/21   Minna Merritts, MD  azithromycin (ZITHROMAX Z-PAK) 250 MG tablet Take 2 tablets (500 mg) on  Day 1,   followed by 1 tablet (250 mg) once daily on Days 2 through 5. 05/09/21 05/14/21  Vanessa [place], MD  benzonatate (TESSALON) 200 MG capsule Take 1 capsule (200 mg total) by mouth 3 (three) times daily as needed for cough. 03/18/21   Jon Billings, NP  calcium acetate (PHOSLO) 667 MG capsule Take 1,334 mg by mouth 3 (three) times daily. 12/03/20   [provider]  cloNIDine (CATAPRES) 0.1 MG tablet Take 1 tablet (0.1 mg total) by mouth 2 (two) times daily as needed (sbp>170). 05/03/21   Sharen Hones, MD  famotidine (PEPCID) 20 MG tablet Take 20 mg by mouth 2 (two) times daily as needed for heartburn or indigestion.    [provider]  ferrous sulfate 325 (65 FE) MG tablet Take 1 tablet (325 mg total) by mouth 3 (three) times daily with meals. 03/12/21   Lavina Hamman, MD  hydrALAZINE (APRESOLINE) 50 MG tablet Take 50 mg by mouth daily as needed (If diastolic bp is 90 or above).     [provider]  losartan (COZAAR) 50 MG tablet Take 2 tablets (100 mg total) by mouth daily. 03/12/21   Lavina Hamman, MD  metoprolol succinate (TOPROL-XL) 100 MG 24 hr tablet Take 1 tablet (100 mg total) by mouth daily. 03/18/21   Jon Billings, NP  ondansetron (ZOFRAN) 4 MG tablet Take 1 tablet (4 mg total) by mouth daily as needed for nausea or vomiting. 03/12/21 03/12/22  Lavina Hamman, MD  pantoprazole (PROTONIX) 40 MG tablet Take 1 tablet (40 mg total) by mouth 2 (two) times daily. 03/12/21   Lavina Hamman, MD  predniSONE (DELTASONE) 20 MG tablet Take 2 tablets (40 mg total) by mouth daily with breakfast for 5 days. 05/09/21 05/14/21  Vanessa Clymer, MD  sodium bicarbonate 650 MG tablet Take 1,300 mg by mouth 2 (two) times daily.     [provider]  zolpidem (AMBIEN) 5 MG tablet Take 5 mg by mouth at bedtime. 04/26/21   [provider]    Allergies Mushroom extract complex and Amoxicillin  Family History  Problem Relation Age of Onset   Diabetes Mother    Kidney  disease Mother        mother on dialysis   CAD Mother    Diabetes Sister    CAD Sister     Social History Social History   Tobacco Use   Smoking status: Heavy Smoker    Packs/day: 1.00    Years: 20.00    Pack years: 20.00    Types: Cigarettes    Last attempt to quit: 08/22/2017    Years since quitting: 3.7   Smokeless tobacco: Never  Vaping Use   Vaping Use: Never used  Substance Use Topics   Alcohol use: Not Currently    Comment: occ. alcohol use   Drug use: No    Review of Systems Constitutional: No fever. Eyes: No visual changes. ENT: No sore throat. Cardiovascular: Denies chest pain. Respiratory: Denies shortness of breath. Gastrointestinal: No nausea, vomiting, diarrhea. Genitourinary: Negative for dysuria. Musculoskeletal: Negative for back pain. Skin: Negative for rash. Neurological: Negative for focal weakness or numbness.  ____________________________________________   PHYSICAL EXAM:  VITAL SIGNS: ED Triage Vitals [05/13/21 2125]  Enc Vitals Group     BP (!) 178/93     Pulse Rate 80     Resp 20     Temp 98.4 F (36.9 C)     Temp Source Oral     SpO2 98 %     Weight 150 lb (68 kg)     Height '5\' 6"'$  (1.676 m)     Head Circumference      Peak Flow      Pain Score 0     Pain Loc      Pain Edu?      Excl. in [place]?    CONSTITUTIONAL: Alert and oriented and responds appropriately to questions.  Chronically ill-appearing, in no distress HEAD: Normocephalic EYES: Conjunctivae clear, pupils appear equal, EOM appear intact ENT: normal nose; moist mucous membranes NECK: Supple, normal ROM CARD: RRR; S1 and S2 appreciated; no murmurs, no clicks, no rubs, no gallops RESP: Normal chest excursion without splinting or tachypnea; breath sounds clear and equal bilaterally; no wheezes, no rhonchi, no rales, no hypoxia or respiratory distress, speaking full sentences ABD/GI: Normal bowel sounds; non-distended; soft, non-tender, no rebound, no guarding, no  peritoneal signs, no hepatosplenomegaly, PD catheter noted BACK: The back appears normal EXT: Normal ROM in all joints; no deformity noted, mild amount of peripheral edema from mid shin down without redness, warmth, compartments are soft, no calf tenderness or calf swelling, extremities warm and well-perfused SKIN: Normal color for age and race; warm; no rash on exposed skin NEURO: Moves all extremities equally PSYCH: The patient's mood and manner are appropriate.  ____________________________________________   LABS (all labs ordered are listed, but only abnormal results are displayed)  Labs Reviewed  CBC WITH DIFFERENTIAL/PLATELET - Abnormal; Notable for the following components:      Result Value   WBC 11.9 (*)    RBC 2.28 (*)    Hemoglobin 7.1 (*)    HCT 21.6 (*)    Neutro Abs 7.8 (*)    Monocytes Absolute 1.2 (*)    Abs Immature Granulocytes 0.17 (*)    All other components within normal limits  BASIC METABOLIC PANEL - Abnormal; Notable for the following components:   CO2 16 (*)    BUN 132 (*)    Creatinine, Ser 18.54 (*)    Calcium 7.4 (*)    GFR, Estimated 3 (*)    Anion gap 19 (*)    All other components within normal limits   ____________________________________________  EKG   ____________________________________________  RADIOLOGY I, Richelle Glick, personally viewed and evaluated these images (plain radiographs) as part of my medical decision making, as well as reviewing the written report by the radiologist.  ED MD interpretation:    Official radiology report(s): No results found.  ____________________________________________   PROCEDURES  Procedure(s) performed (including Critical Care):  Procedures    ____________________________________________   INITIAL IMPRESSION / ASSESSMENT AND PLAN / ED COURSE  As part of my medical decision making, I reviewed the following data within the [place] notes reviewed and  incorporated, Labs reviewed , Old chart reviewed, and Notes from prior ED visits         Patient here with very mild peripheral edema without any other systemic symptoms.  Blood work obtained from triage appears at his baseline.  He denies any chest pain or shortness of breath.  No hypoxia or increased work of breathing here.  He is requesting a dose  of Lasix as he states he still makes urine he thinks this will help get some of the fluid off of his legs.  Will give 1 IM dose here the recommended close follow-up with his primary care doctor.  Also states he is having nasal congestion and sinus drainage.  Offered COVID testing today which he declines.  No productive cough.  Lungs are clear.  I do not feel he needs antibiotics.  Recommended Afrin twice daily for the next 3 days for symptomatic relief.  Recommended that he avoid any other oral decongestions given his history.  At this time, I do not feel there is any life-threatening condition present. I have reviewed, interpreted and discussed all results (EKG, imaging, lab, urine as appropriate) and exam findings with patient/family. I have reviewed nursing notes and appropriate previous records.  I feel the patient is safe to be discharged home without further emergent workup and can continue workup as an outpatient as needed. Discussed usual and customary return precautions. Patient/family verbalize understanding and are comfortable with this plan.  Outpatient follow-up has been provided as needed. All questions have been answered.  ____________________________________________   FINAL CLINICAL IMPRESSION(S) / ED DIAGNOSES  Final diagnoses:  Peripheral edema  Nasal congestion     ED Discharge Orders     None       *Please note:  Larry [name] was evaluated in Emergency Department on 05/14/2021 for the symptoms described in the history of present illness. He was evaluated in the context of the global COVID-19 pandemic, which  necessitated consideration that the patient might be at risk for infection with the SARS-CoV-2 virus that causes COVID-19. Institutional protocols and algorithms that pertain to the evaluation of patients at risk for COVID-19 are in a state of rapid change based on information released by regulatory bodies including the CDC and federal and state organizations. These policies and algorithms were followed during the patient's care in the ED.  Some ED evaluations and interventions may be delayed as a result of limited staffing during and the pandemic.*   Note:  This document was prepared using Dragon voice recognition software and may include unintentional dictation errors.    Donita Newland, Delice Bison, DO 05/14/21 0201

## 2021-05-14 NOTE — Progress Notes (Deleted)
   Patient ID: DUVON NATIONS, male    DOB: December 12, 1965, 55 y.o.   MRN: [id number]  HPI  Mr Mueth is a 55 y/o male with a history of  Echo report from 12/06/20 reviewed and showed an EF of 55-60% along with moderate LVH.  Was in the ED 05/14/21 due to peripheral edema. Given lasix and he was released. Has been in the ED 3 other times during the month of August.   He presents today for his initial visit with a chief complaint of   Review of Systems    Physical Exam  Assessment & Plan:  1: Chronic heart failure with preserved ejection fraction with structural changes (LVH)- - NYHA class  - saw cardiology Rockey Situ) 01/16/21 - BNP 05/09/21 was 3629.8  2: HTN- - BP - saw PCP Mathis Dad) 03/18/21 - BMP 05/13/21 reviewed and showed sodium 136, potassium 4.9, creatinine 18.54 and GFR 3  3: ESRD- - on peritoneal dialysis  4: Tobacco use-

## 2021-05-14 NOTE — Discharge Instructions (Addendum)
You may use Afrin nasal spray over-the-counter 1 to 2 sprays in each nostril twice a day for 3 days to help with your nasal congestion.

## 2021-05-15 ENCOUNTER — Ambulatory Visit: Payer: Medicare Other | Admitting: Family

## 2021-05-15 ENCOUNTER — Telehealth: Payer: Self-pay | Admitting: Family

## 2021-05-15 NOTE — Telephone Encounter (Signed)
Patient did not show for his [facility] appointment on 05/15/21. Will attempt to reschedule.

## 2021-05-18 DIAGNOSIS — Z992 Dependence on renal dialysis: Secondary | ICD-10-CM | POA: Diagnosis not present

## 2021-05-18 DIAGNOSIS — J90 Pleural effusion, not elsewhere classified: Secondary | ICD-10-CM | POA: Diagnosis not present

## 2021-05-18 DIAGNOSIS — J189 Pneumonia, unspecified organism: Secondary | ICD-10-CM | POA: Diagnosis not present

## 2021-05-18 DIAGNOSIS — J9601 Acute respiratory failure with hypoxia: Secondary | ICD-10-CM | POA: Diagnosis not present

## 2021-05-18 DIAGNOSIS — N186 End stage renal disease: Secondary | ICD-10-CM | POA: Diagnosis not present

## 2021-05-18 DIAGNOSIS — R918 Other nonspecific abnormal finding of lung field: Secondary | ICD-10-CM | POA: Diagnosis not present

## 2021-05-18 DIAGNOSIS — I161 Hypertensive emergency: Secondary | ICD-10-CM | POA: Diagnosis not present

## 2021-05-19 DIAGNOSIS — J9601 Acute respiratory failure with hypoxia: Secondary | ICD-10-CM | POA: Diagnosis not present

## 2021-05-19 DIAGNOSIS — I120 Hypertensive chronic kidney disease with stage 5 chronic kidney disease or end stage renal disease: Secondary | ICD-10-CM | POA: Diagnosis not present

## 2021-05-19 DIAGNOSIS — N186 End stage renal disease: Secondary | ICD-10-CM | POA: Diagnosis not present

## 2021-05-19 DIAGNOSIS — J449 Chronic obstructive pulmonary disease, unspecified: Secondary | ICD-10-CM | POA: Diagnosis not present

## 2021-05-19 DIAGNOSIS — Z992 Dependence on renal dialysis: Secondary | ICD-10-CM | POA: Diagnosis not present

## 2021-05-19 DIAGNOSIS — I161 Hypertensive emergency: Secondary | ICD-10-CM | POA: Diagnosis not present

## 2021-05-19 DIAGNOSIS — D631 Anemia in chronic kidney disease: Secondary | ICD-10-CM | POA: Diagnosis not present

## 2021-05-19 DIAGNOSIS — N2581 Secondary hyperparathyroidism of renal origin: Secondary | ICD-10-CM | POA: Diagnosis not present

## 2021-05-19 DIAGNOSIS — J810 Acute pulmonary edema: Secondary | ICD-10-CM | POA: Diagnosis not present

## 2021-05-19 DIAGNOSIS — E872 Acidosis: Secondary | ICD-10-CM | POA: Diagnosis not present

## 2021-05-20 DIAGNOSIS — N2581 Secondary hyperparathyroidism of renal origin: Secondary | ICD-10-CM | POA: Diagnosis not present

## 2021-05-20 DIAGNOSIS — I120 Hypertensive chronic kidney disease with stage 5 chronic kidney disease or end stage renal disease: Secondary | ICD-10-CM | POA: Diagnosis not present

## 2021-05-20 DIAGNOSIS — Z992 Dependence on renal dialysis: Secondary | ICD-10-CM | POA: Diagnosis not present

## 2021-05-20 DIAGNOSIS — I161 Hypertensive emergency: Secondary | ICD-10-CM | POA: Diagnosis not present

## 2021-05-20 DIAGNOSIS — N186 End stage renal disease: Secondary | ICD-10-CM | POA: Diagnosis not present

## 2021-05-20 DIAGNOSIS — D631 Anemia in chronic kidney disease: Secondary | ICD-10-CM | POA: Diagnosis not present

## 2021-05-20 DIAGNOSIS — J441 Chronic obstructive pulmonary disease with (acute) exacerbation: Secondary | ICD-10-CM | POA: Diagnosis not present

## 2021-05-20 DIAGNOSIS — J189 Pneumonia, unspecified organism: Secondary | ICD-10-CM | POA: Diagnosis not present

## 2021-05-21 DIAGNOSIS — D631 Anemia in chronic kidney disease: Secondary | ICD-10-CM | POA: Diagnosis not present

## 2021-05-21 DIAGNOSIS — Z992 Dependence on renal dialysis: Secondary | ICD-10-CM | POA: Diagnosis not present

## 2021-05-21 DIAGNOSIS — I161 Hypertensive emergency: Secondary | ICD-10-CM | POA: Diagnosis not present

## 2021-05-21 DIAGNOSIS — I480 Paroxysmal atrial fibrillation: Secondary | ICD-10-CM | POA: Diagnosis not present

## 2021-05-21 DIAGNOSIS — J441 Chronic obstructive pulmonary disease with (acute) exacerbation: Secondary | ICD-10-CM | POA: Diagnosis not present

## 2021-05-21 DIAGNOSIS — N2581 Secondary hyperparathyroidism of renal origin: Secondary | ICD-10-CM | POA: Diagnosis not present

## 2021-05-21 DIAGNOSIS — I120 Hypertensive chronic kidney disease with stage 5 chronic kidney disease or end stage renal disease: Secondary | ICD-10-CM | POA: Diagnosis not present

## 2021-05-21 DIAGNOSIS — N186 End stage renal disease: Secondary | ICD-10-CM | POA: Diagnosis not present

## 2021-05-30 ENCOUNTER — Telehealth: Payer: Self-pay | Admitting: Nurse Practitioner

## 2021-05-30 NOTE — Telephone Encounter (Signed)
Routing to provider to advise.  

## 2021-05-30 NOTE — Telephone Encounter (Signed)
Patient is out of Ambien and has requested a refill on Ambien, completely out.  He apologizes that he waited last minute for the refill.  Patient uses [facility] in [place], [place].  Doctor Holley Raring is the patient's prescriber, patient states that doctor is out of town and would like to know if you will fill.

## 2021-05-30 NOTE — Telephone Encounter (Signed)
Patient will have to get this from Dr. Holley Raring.

## 2021-06-07 DIAGNOSIS — R300 Dysuria: Secondary | ICD-10-CM | POA: Diagnosis not present

## 2021-06-07 DIAGNOSIS — R3916 Straining to void: Secondary | ICD-10-CM | POA: Diagnosis not present

## 2021-06-07 DIAGNOSIS — Z6823 Body mass index (BMI) 23.0-23.9, adult: Secondary | ICD-10-CM | POA: Diagnosis not present

## 2021-06-07 DIAGNOSIS — Z9049 Acquired absence of other specified parts of digestive tract: Secondary | ICD-10-CM | POA: Diagnosis not present

## 2021-06-07 DIAGNOSIS — R339 Retention of urine, unspecified: Secondary | ICD-10-CM | POA: Diagnosis not present

## 2021-06-07 DIAGNOSIS — N186 End stage renal disease: Secondary | ICD-10-CM | POA: Diagnosis not present

## 2021-06-07 DIAGNOSIS — I120 Hypertensive chronic kidney disease with stage 5 chronic kidney disease or end stage renal disease: Secondary | ICD-10-CM | POA: Diagnosis not present

## 2021-06-07 DIAGNOSIS — Z992 Dependence on renal dialysis: Secondary | ICD-10-CM | POA: Diagnosis not present

## 2021-06-07 DIAGNOSIS — R69 Illness, unspecified: Secondary | ICD-10-CM | POA: Diagnosis not present

## 2021-06-11 ENCOUNTER — Ambulatory Visit: Payer: Medicare Other | Admitting: Nurse Practitioner

## 2021-06-11 ENCOUNTER — Ambulatory Visit: Payer: Self-pay

## 2021-06-11 NOTE — Telephone Encounter (Signed)
Pt c/o severe diarrhea that began on 06/07/21. The diarrhea is watery and pt noted a small amount of blood on the toilet paper. Pt c/o sour stomach and he vomited last night and the sour stomach is no longer present. No abdominal pain, no fever. Care advice given to pt and pt verbalized understanding. Appt made tomorrow with Dr Neomia Dear.       Reason for Disposition  [1] Blood in the stool AND [2] small amount of blood   (Exception: only on toilet paper. Reason: diarrhea can cause rectal irritation with blood on wiping)  Answer Assessment - Initial Assessment Questions 1. DIARRHEA SEVERITY: "How bad is the diarrhea?" "How many more stools have you had in the past 24 hours than normal?"    - NO DIARRHEA (SCALE 0)   - MILD (SCALE 1-3): Few loose or mushy BMs; increase of 1-3 stools over normal daily number of stools; mild increase in ostomy output.   -  MODERATE (SCALE 4-7): Increase of 4-6 stools daily over normal; moderate increase in ostomy output. * SEVERE (SCALE 8-10; OR 'WORST POSSIBLE'): Increase of 7 or more stools daily over normal; moderate increase in ostomy output; incontinence.     severe 2. ONSET: "When did the diarrhea begin?"      9-16/22 3. BM CONSISTENCY: "How loose or watery is the diarrhea?"      watery 4. VOMITING: "Are you also vomiting?" If Yes, ask: "How many times in the past 24 hours?"      Monday night x 1 5. ABDOMINAL PAIN: "Are you having any abdominal pain?" If Yes, ask: "What does it feel like?" (e.g., crampy, dull, intermittent, constant)      none 6. ABDOMINAL PAIN SEVERITY: If present, ask: "How bad is the pain?"  (e.g., Scale 1-10; mild, moderate, or severe)   - MILD (1-3): doesn't interfere with normal activities, abdomen soft and not tender to touch    - MODERATE (4-7): interferes with normal activities or awakens from sleep, abdomen tender to touch    - SEVERE (8-10): excruciating pain, doubled over, unable to do any normal activities       none 7. ORAL  INTAKE: If vomiting, "Have you been able to drink liquids?" "How much liquids have you had in the past 24 hours?"     Yes/8 oz 8. HYDRATION: "Any signs of dehydration?" (e.g., dry mouth [not just dry lips], too weak to stand, dizziness, new weight loss) "When did you last urinate?"     No/this am 9. EXPOSURE: "Have you traveled to a foreign country recently?" "Have you been exposed to anyone with diarrhea?" "Could you have eaten any food that was spoiled?"     No-no-no 10. ANTIBIOTIC USE: "Are you taking antibiotics now or have you taken antibiotics in the past 2 months?"       No-yes 11. OTHER SYMPTOMS: "Do you have any other symptoms?" (e.g., fever, blood in stool)       Blood in stool 12. PREGNANCY: "Is there any chance you are pregnant?" "When was your last menstrual period?"       N/a  Protocols used: [facility]

## 2021-06-11 NOTE — Progress Notes (Deleted)
   There were no vitals taken for this visit.   Subjective:    Patient ID: Larry [name], male    DOB: April 03, 1966, 55 y.o.   MRN: [id number]  HPI: Larry [name] is a 55 y.o. male  No chief complaint on file.  ABDOMINAL ISSUES Duration: {Blank single:19197::"chronic","days","weeks","months"} Nature: {Blank multiple:19196::"bloating","sharp","dull","aching","burning","cramping","ill-defined","itchy","pressure-like","pulling","shooting","sore","stabbing","tender","tearing","throbbing"} Location: {Blank multiple:19196::"diffuse","vague","LUQ","RUQ","epigastric","peri-umbilical","LLQ","RLQ","diffuse","suprapubic". "lower abdominal quadrants"}  Severity: {Blank single:19197::"mild","moderate","severe","1/10","2/10","3/10","4/10","5/10","6/10","7/10","8/10","9/10","10/10"}  Radiation: {Blank single:19197::"yes","no"} Episode duration: Frequency: {Blank single:19197::"constant","intermittent","occasional","rare","every few minutes","a few times a hour","a few times a day","a few times a week","a few times a month","a few times a year"} Alleviating factors: Aggravating factors: Treatments attempted: {Blank multiple:19196::"none","antacids","PPI","H2 Blocker","laxatives"} Constipation: {Blank single:19197::"yes","no","intermittent"} Diarrhea: {Blank single:19197::"yes","no"} Episodes of diarrhea/day: Mucous in the stool: {Blank single:19197::"yes","no"} Heartburn: {Blank single:19197::"yes","no"} Bloating:{Blank single:19197::"yes","no"} Flatulence: {Blank single:19197::"yes","no"} Nausea: {Blank single:19197::"yes","no"} Vomiting: {Blank single:19197::"yes","no"} Episodes of vomit/day: Melena or hematochezia: {Blank single:19197::"yes","no"} Rash: {Blank single:19197::"yes","no"} Jaundice: {Blank single:19197::"yes","no"} Fever: {Blank single:19197::"yes","no"} Weight loss: {Blank single:19197::"yes","no"}  Relevant past medical, surgical, family and social history reviewed and updated  as indicated. Interim medical history since our last visit reviewed. Allergies and medications reviewed and updated.  Review of Systems  Per HPI unless specifically indicated above     Objective:    There were no vitals taken for this visit.  Wt Readings from Last 3 Encounters:  05/13/21 150 lb (68 kg)  05/08/21 150 lb (68 kg)  05/02/21 141 lb 1.5 oz (64 kg)    Physical Exam  Results for orders placed or performed during the hospital encounter of 05/14/21  CBC with Differential  Result Value Ref Range   WBC 11.9 (H) 4.0 - 10.5 K/uL   RBC 2.28 (L) 4.22 - 5.81 MIL/uL   Hemoglobin 7.1 (L) 13.0 - 17.0 g/dL   HCT 21.6 (L) 39.0 - 52.0 %   MCV 94.7 80.0 - 100.0 fL   MCH 31.1 26.0 - 34.0 pg   MCHC 32.9 30.0 - 36.0 g/dL   RDW 15.2 11.5 - 15.5 %   Platelets 345 150 - 400 K/uL   nRBC 0.0 0.0 - 0.2 %   Neutrophils Relative % 66 %   Neutro Abs 7.8 (H) 1.7 - 7.7 K/uL   Lymphocytes Relative 20 %   Lymphs Abs 2.3 0.7 - 4.0 K/uL   Monocytes Relative 10 %   Monocytes Absolute 1.2 (H) 0.1 - 1.0 K/uL   Eosinophils Relative 3 %   Eosinophils Absolute 0.4 0.0 - 0.5 K/uL   Basophils Relative 0 %   Basophils Absolute 0.0 0.0 - 0.1 K/uL   Immature Granulocytes 1 %   Abs Immature Granulocytes 0.17 (H) 0.00 - 0.07 K/uL  Basic metabolic panel  Result Value Ref Range   Sodium 136 135 - 145 mmol/L   Potassium 4.9 3.5 - 5.1 mmol/L   Chloride 101 98 - 111 mmol/L   CO2 16 (L) 22 - 32 mmol/L   Glucose, Bld 90 70 - 99 mg/dL   BUN 132 (H) 6 - 20 mg/dL   Creatinine, Ser 18.54 (H) 0.61 - 1.24 mg/dL   Calcium 7.4 (L) 8.9 - 10.3 mg/dL   GFR, Estimated 3 (L) >60 mL/min   Anion gap 19 (H) 5 - 15      Assessment & Plan:   Problem List Items Addressed This Visit   None    Follow up plan: No follow-ups on file.

## 2021-06-12 ENCOUNTER — Encounter: Payer: Self-pay | Admitting: Nurse Practitioner

## 2021-06-12 ENCOUNTER — Telehealth: Payer: Self-pay | Admitting: Nurse Practitioner

## 2021-06-12 ENCOUNTER — Other Ambulatory Visit: Payer: Self-pay

## 2021-06-12 ENCOUNTER — Ambulatory Visit (INDEPENDENT_AMBULATORY_CARE_PROVIDER_SITE_OTHER): Payer: Medicare Other | Admitting: Nurse Practitioner

## 2021-06-12 VITALS — BP 148/84 | HR 89 | Temp 98.2°F | Wt 135.2 lb

## 2021-06-12 DIAGNOSIS — R0981 Nasal congestion: Secondary | ICD-10-CM | POA: Diagnosis not present

## 2021-06-12 DIAGNOSIS — R197 Diarrhea, unspecified: Secondary | ICD-10-CM

## 2021-06-12 MED ORDER — MONTELUKAST SODIUM 10 MG PO TABS
10.0000 mg | ORAL_TABLET | Freq: Every day | ORAL | 0 refills | Status: DC
Start: 2021-06-12 — End: 2021-08-03

## 2021-06-12 NOTE — Telephone Encounter (Signed)
Copied from [place] [id number] [id number]. Topic: Medicare AWV >> Jun 12, 2021 12:50 PM Lavonia Drafts wrote: Left message for patient to call back and schedule Medicare Annual Wellness Visit (AWV) to be done virtually or by telephone.  No hx of AWV eligible as of 12/21/20  Please schedule at anytime with [facility]-Nurse Health Advisor.      36 Minutes appointment   Any questions, please call me at [phone]

## 2021-06-12 NOTE — Telephone Encounter (Deleted)
Patient never followed back up and did not answer when called, closing out encounter.

## 2021-06-12 NOTE — Progress Notes (Signed)
BP (!) 148/84 (BP Location: Right Arm, Cuff Size: Normal)   Pulse 89   Temp 98.2 F (36.8 C) (Oral)   Wt 135 lb 3.2 oz (61.3 kg)   SpO2 98%   BMI 21.82 kg/m    Subjective:    Patient ID: Larry [name], male    DOB: [birth year], 55 y.o.   MRN: [id number]  HPI: Larry [name] is a 55 y.o. male  Chief Complaint  Patient presents with   Diarrhea    Pt states that over this past weekend, he was having diarrhea episodes since this past weekend. States he went about 10 times per day over the weekend, 3 days per day the past 2 days. States he noticed some blood when he wiped over the weekend.    Insect Bite    Pt states he has 3 bites on his body, one on his L thigh, L arm, and lower L back. States he his not sure what bit him but states the areas are not healing.    ABDOMINAL ISSUES Duration: days Nature:  no pain Location:  no pain   Severity:  none   Radiation: no Episode duration: Frequency:  none Alleviating factors: Aggravating factors: Treatments attempted: none Constipation: no Diarrhea: yes Episodes of diarrhea/day: 10x for 3 days. Today is still have 3-4 loose stools. Mucous in the stool: no Heartburn: no Bloating:no Flatulence: no Nausea: no Vomiting: Threw up Monday night Episodes of vomit/day: Melena or hematochezia: no Rash: no Jaundice: no Fever: no Weight loss: no  Patient states he was bit by something about a month ago on his back and it hasn't resolved.    Relevant past medical, surgical, family and social history reviewed and updated as indicated. Interim medical history since our last visit reviewed. Allergies and medications reviewed and updated.  Review of Systems  Constitutional:  Negative for fever.  Gastrointestinal:  Positive for diarrhea and vomiting. Negative for abdominal distention, abdominal pain, blood in stool, constipation and nausea.   Per HPI unless specifically indicated above     Objective:    BP (!) 148/84 (BP  Location: Right Arm, Cuff Size: Normal)   Pulse 89   Temp 98.2 F (36.8 C) (Oral)   Wt 135 lb 3.2 oz (61.3 kg)   SpO2 98%   BMI 21.82 kg/m   Wt Readings from Last 3 Encounters:  06/12/21 135 lb 3.2 oz (61.3 kg)  05/13/21 150 lb (68 kg)  05/08/21 150 lb (68 kg)    Physical Exam Vitals and nursing note reviewed.  Constitutional:      General: He is not in acute distress.    Appearance: Normal appearance. He is not ill-appearing, toxic-appearing or diaphoretic.  HENT:     Head: Normocephalic.     Right Ear: External ear normal.     Left Ear: External ear normal.     Nose: Nose normal. No congestion or rhinorrhea.     Mouth/Throat:     Mouth: Mucous membranes are moist.  Eyes:     General:        Right eye: No discharge.        Left eye: No discharge.     Extraocular Movements: Extraocular movements intact.     Conjunctiva/sclera: Conjunctivae normal.     Pupils: Pupils are equal, round, and reactive to light.  Cardiovascular:     Rate and Rhythm: Normal rate and regular rhythm.     Heart sounds: No murmur heard. Pulmonary:  Effort: Pulmonary effort is normal. No respiratory distress.     Breath sounds: Normal breath sounds. No wheezing, rhonchi or rales.  Abdominal:     General: Abdomen is flat. Bowel sounds are normal. There is no distension.     Palpations: Abdomen is soft.     Tenderness: There is no abdominal tenderness. There is no right CVA tenderness, left CVA tenderness or guarding.  Musculoskeletal:     Cervical back: Normal range of motion and neck supple.  Skin:    General: Skin is warm and dry.     Capillary Refill: Capillary refill takes less than 2 seconds.  Neurological:     General: No focal deficit present.     Mental Status: He is alert and oriented to person, place, and time.  Psychiatric:        Mood and Affect: Mood normal.        Behavior: Behavior normal.        Thought Content: Thought content normal.        Judgment: Judgment normal.     Results for orders placed or performed during the hospital encounter of 05/14/21  CBC with Differential  Result Value Ref Range   WBC 11.9 (H) 4.0 - 10.5 K/uL   RBC 2.28 (L) 4.22 - 5.81 MIL/uL   Hemoglobin 7.1 (L) 13.0 - 17.0 g/dL   HCT 21.6 (L) 39.0 - 52.0 %   MCV 94.7 80.0 - 100.0 fL   MCH 31.1 26.0 - 34.0 pg   MCHC 32.9 30.0 - 36.0 g/dL   RDW 15.2 11.5 - 15.5 %   Platelets 345 150 - 400 K/uL   nRBC 0.0 0.0 - 0.2 %   Neutrophils Relative % 66 %   Neutro Abs 7.8 (H) 1.7 - 7.7 K/uL   Lymphocytes Relative 20 %   Lymphs Abs 2.3 0.7 - 4.0 K/uL   Monocytes Relative 10 %   Monocytes Absolute 1.2 (H) 0.1 - 1.0 K/uL   Eosinophils Relative 3 %   Eosinophils Absolute 0.4 0.0 - 0.5 K/uL   Basophils Relative 0 %   Basophils Absolute 0.0 0.0 - 0.1 K/uL   Immature Granulocytes 1 %   Abs Immature Granulocytes 0.17 (H) 0.00 - 0.07 K/uL  Basic metabolic panel  Result Value Ref Range   Sodium 136 135 - 145 mmol/L   Potassium 4.9 3.5 - 5.1 mmol/L   Chloride 101 98 - 111 mmol/L   CO2 16 (L) 22 - 32 mmol/L   Glucose, Bld 90 70 - 99 mg/dL   BUN 132 (H) 6 - 20 mg/dL   Creatinine, Ser 18.54 (H) 0.61 - 1.24 mg/dL   Calcium 7.4 (L) 8.9 - 10.3 mg/dL   GFR, Estimated 3 (L) >60 mL/min   Anion gap 19 (H) 5 - 15      Assessment & Plan:   Problem List Items Addressed This Visit   None Visit Diagnoses     Diarrhea, unspecified type    -  Primary   Will obtain stool studies. Discussed with patient that it is likely related to something he ate due to recently going to a cookout.  Will await lab results.   Relevant Orders   Cdiff NAA+O+P+Stool Culture   Chronic nasal congestion       Will try singulair to help with patient's symptoms. Patient has tried several OTC allergy medications including zyrtec and allegra.         Follow up plan: Return in about 1 month (  around 07/12/2021) for HTN, HLD, DM2 FU, sinuses and sleep.   A total of 30 minutes were spent on this encounter today.  When  total time is documented, this includes both the face-to-face and non-face-to-face time personally spent before, during and after the visit on the date of the encounter discussing plan of care for diarrhea and adding singulair to help with patient's symptoms.

## 2021-06-18 ENCOUNTER — Ambulatory Visit: Payer: Medicare Other | Admitting: Nurse Practitioner

## 2021-06-21 ENCOUNTER — Telehealth: Payer: Self-pay

## 2021-06-21 DIAGNOSIS — N186 End stage renal disease: Secondary | ICD-10-CM | POA: Diagnosis not present

## 2021-06-21 DIAGNOSIS — R197 Diarrhea, unspecified: Secondary | ICD-10-CM

## 2021-06-21 DIAGNOSIS — Z992 Dependence on renal dialysis: Secondary | ICD-10-CM | POA: Diagnosis not present

## 2021-06-21 DIAGNOSIS — K625 Hemorrhage of anus and rectum: Secondary | ICD-10-CM

## 2021-06-21 LAB — CDIFF NAA+O+P+STOOL CULTURE
E coli, Shiga toxin Assay: NEGATIVE
Toxigenic C. Difficile by PCR: NEGATIVE

## 2021-06-21 NOTE — Progress Notes (Signed)
Please let patient know that his stool tests are negative.  If he is still having symptoms I can refer him to Gastroenterology.

## 2021-06-21 NOTE — Telephone Encounter (Signed)
-----   Message from Jon Billings, NP sent at 06/21/2021  2:00 PM EDT ----- Please let patient know that his stool tests are negative.  If he is still having symptoms I can refer him to Gastroenterology.

## 2021-06-21 NOTE — Telephone Encounter (Signed)
Informed patient of results and recommendations.  Referral placed.

## 2021-06-24 ENCOUNTER — Ambulatory Visit: Payer: Medicare Other

## 2021-06-24 ENCOUNTER — Ambulatory Visit (INDEPENDENT_AMBULATORY_CARE_PROVIDER_SITE_OTHER): Payer: Medicare Other

## 2021-06-24 VITALS — Ht 66.0 in | Wt 140.0 lb

## 2021-06-24 DIAGNOSIS — Z0000 Encounter for general adult medical examination without abnormal findings: Secondary | ICD-10-CM | POA: Diagnosis not present

## 2021-06-24 NOTE — Patient Instructions (Signed)
Larry [name] , Thank you for taking time to come for your Medicare Wellness Visit. I appreciate your ongoing commitment to your health goals. Please review the following plan we discussed and let me know if I can assist you in the future.   Screening recommendations/referrals: Colonoscopy: completed in 2020 per patient Recommended yearly ophthalmology/optometry visit for glaucoma screening and checkup Recommended yearly dental visit for hygiene and checkup  Vaccinations: Influenza vaccine: completed 05/29/2021 Pneumococcal vaccine: completed 03/02/2018 Tdap vaccine: due Shingles vaccine: discussed   Covid-19:  11/07/2019, 12/05/2019, 09/27/2020  Advanced directives: Advance directive discussed with you today.   Conditions/risks identified: none  Next appointment: Follow up in one year for your annual wellness visit   Preventive Care 40-64 Years, Male Preventive care refers to lifestyle choices and visits with your health care provider that can promote health and wellness. What does preventive care include? A yearly physical exam. This is also called an annual well check. Dental exams once or twice a year. Routine eye exams. Ask your health care provider how often you should have your eyes checked. Personal lifestyle choices, including: Daily care of your teeth and gums. Regular physical activity. Eating a healthy diet. Avoiding tobacco and drug use. Limiting alcohol use. Practicing safe sex. Taking low-dose aspirin every day starting at age 50. What happens during an annual well check? The services and screenings done by your health care provider during your annual well check will depend on your age, overall health, lifestyle risk factors, and family history of disease. Counseling  Your health care provider may ask you questions about your: Alcohol use. Tobacco use. Drug use. Emotional well-being. Home and relationship well-being. Sexual activity. Eating habits. Work and work  Statistician. Screening  You may have the following tests or measurements: Height, weight, and BMI. Blood pressure. Lipid and cholesterol levels. These may be checked every 5 years, or more frequently if you are over 73 years old. Skin check. Lung cancer screening. You may have this screening every year starting at age 83 if you have a 30-pack-year history of smoking and currently smoke or have quit within the past 15 years. Fecal occult blood test (FOBT) of the stool. You may have this test every year starting at age 9. Flexible sigmoidoscopy or colonoscopy. You may have a sigmoidoscopy every 5 years or a colonoscopy every 10 years starting at age 46. Prostate cancer screening. Recommendations will vary depending on your family history and other risks. Hepatitis C blood test. Hepatitis B blood test. Sexually transmitted disease (STD) testing. Diabetes screening. This is done by checking your blood sugar (glucose) after you have not eaten for a while (fasting). You may have this done every 1-3 years. Discuss your test results, treatment options, and if necessary, the need for more tests with your health care provider. Vaccines  Your health care provider may recommend certain vaccines, such as: Influenza vaccine. This is recommended every year. Tetanus, diphtheria, and acellular pertussis (Tdap, Td) vaccine. You may need a Td booster every 10 years. Zoster vaccine. You may need this after age 77. Pneumococcal 13-valent conjugate (PCV13) vaccine. You may need this if you have certain conditions and have not been vaccinated. Pneumococcal polysaccharide (PPSV23) vaccine. You may need one or two doses if you smoke cigarettes or if you have certain conditions. Talk to your health care provider about which screenings and vaccines you need and how often you need them. This information is not intended to replace advice given to you by your health care  provider. Make sure you discuss any questions you  have with your health care provider. Document Released: 10/05/2015 Document Revised: 05/28/2016 Document Reviewed: 07/10/2015 Elsevier Interactive Patient Education  2017 [employer] Prevention in the Home Falls can cause injuries. They can happen to people of all ages. There are many things you can do to make your home safe and to help prevent falls. What can I do on the outside of my home? Regularly fix the edges of walkways and driveways and fix any cracks. Remove anything that might make you trip as you walk through a door, such as a raised step or threshold. Trim any bushes or trees on the path to your home. Use bright outdoor lighting. Clear any walking paths of anything that might make someone trip, such as rocks or tools. Regularly check to see if handrails are loose or broken. Make sure that both sides of any steps have handrails. Any raised decks and porches should have guardrails on the edges. Have any leaves, snow, or ice cleared regularly. Use sand or salt on walking paths during winter. Clean up any spills in your garage right away. This includes oil or grease spills. What can I do in the bathroom? Use night lights. Install grab bars by the toilet and in the tub and shower. Do not use towel bars as grab bars. Use non-skid mats or decals in the tub or shower. If you need to sit down in the shower, use a plastic, non-slip stool. Keep the floor dry. Clean up any water that spills on the floor as soon as it happens. Remove soap buildup in the tub or shower regularly. Attach bath mats securely with double-sided non-slip rug tape. Do not have throw rugs and other things on the floor that can make you trip. What can I do in the bedroom? Use night lights. Make sure that you have a light by your bed that is easy to reach. Do not use any sheets or blankets that are too big for your bed. They should not hang down onto the floor. Have a firm chair that has side arms. You can  use this for support while you get dressed. Do not have throw rugs and other things on the floor that can make you trip. What can I do in the kitchen? Clean up any spills right away. Avoid walking on wet floors. Keep items that you use a lot in easy-to-reach places. If you need to reach something above you, use a strong step stool that has a grab bar. Keep electrical cords out of the way. Do not use floor polish or wax that makes floors slippery. If you must use wax, use non-skid floor wax. Do not have throw rugs and other things on the floor that can make you trip. What can I do with my stairs? Do not leave any items on the stairs. Make sure that there are handrails on both sides of the stairs and use them. Fix handrails that are broken or loose. Make sure that handrails are as long as the stairways. Check any carpeting to make sure that it is firmly attached to the stairs. Fix any carpet that is loose or worn. Avoid having throw rugs at the top or bottom of the stairs. If you do have throw rugs, attach them to the floor with carpet tape. Make sure that you have a light switch at the top of the stairs and the bottom of the stairs. If you do not have  them, ask someone to add them for you. What else can I do to help prevent falls? Wear shoes that: Do not have high heels. Have rubber bottoms. Are comfortable and fit you well. Are closed at the toe. Do not wear sandals. If you use a stepladder: Make sure that it is fully opened. Do not climb a closed stepladder. Make sure that both sides of the stepladder are locked into place. Ask someone to hold it for you, if possible. Clearly mark and make sure that you can see: Any grab bars or handrails. First and last steps. Where the edge of each step is. Use tools that help you move around (mobility aids) if they are needed. These include: Canes. Walkers. Scooters. Crutches. Turn on the lights when you go into a dark area. Replace any light  bulbs as soon as they burn out. Set up your furniture so you have a clear path. Avoid moving your furniture around. If any of your floors are uneven, fix them. If there are any pets around you, be aware of where they are. Review your medicines with your doctor. Some medicines can make you feel dizzy. This can increase your chance of falling. Ask your doctor what other things that you can do to help prevent falls. This information is not intended to replace advice given to you by your health care provider. Make sure you discuss any questions you have with your health care provider. Document Released: 07/05/2009 Document Revised: 02/14/2016 Document Reviewed: 10/13/2014 Elsevier Interactive Patient Education  2017 [employer].

## 2021-06-24 NOTE — Progress Notes (Signed)
I connected with Larry [name] today by telephone and verified that I am speaking with the correct person using two identifiers. Location patient: home Location provider: work Persons participating in the virtual visit: Larry [name], Glenna Durand LPN.   I discussed the limitations, risks, security and privacy concerns of performing an evaluation and management service by telephone and the availability of in person appointments. I also discussed with the patient that there may be a patient responsible charge related to this service. The patient expressed understanding and verbally consented to this telephonic visit.    Interactive audio and video telecommunications were attempted between this provider and patient, however failed, due to patient having technical difficulties OR patient did not have access to video capability.  We continued and completed visit with audio only.     Vital signs may be patient reported or missing.  Subjective:   Larry [name] is a 55 y.o. male who presents for an Initial Medicare Annual Wellness Visit.  Review of Systems     Cardiac Risk Factors include: hypertension;male gender;sedentary lifestyle     Objective:    Today's Vitals   06/24/21 1341  Weight: 140 lb (63.5 kg)  Height: '5\' 6"'$  (1.676 m)   Body mass index is 22.6 kg/m.  Advanced Directives 06/24/2021 05/13/2021 05/09/2021 05/02/2021 04/29/2021 03/07/2021 03/06/2021  Does Patient Have a Medical Advance Directive? No No No No No No No  Would patient like information on creating a medical advance directive? - - No - Patient declined No - Patient declined No - Patient declined - No - Patient declined    Current Medications (verified) Outpatient Encounter Medications as of 06/24/2021  Medication Sig   acetaminophen (TYLENOL) 500 MG tablet Take 500-1,000 mg by mouth every 6 (six) hours as needed for mild pain or moderate pain.   amLODipine (NORVASC) 10 MG tablet Take 1 tablet (10 mg total) by mouth  daily.   carvedilol (COREG) 25 MG tablet Take 25 mg by mouth 2 (two) times daily.   folic acid (FOLVITE) 1 MG tablet Take 1 mg by mouth daily.   losartan (COZAAR) 100 MG tablet Take 100 mg by mouth daily.   montelukast (SINGULAIR) 10 MG tablet Take 1 tablet (10 mg total) by mouth at bedtime.   ondansetron (ZOFRAN) 4 MG tablet Take 1 tablet (4 mg total) by mouth daily as needed for nausea or vomiting.   pantoprazole (PROTONIX) 40 MG tablet Take 1 tablet (40 mg total) by mouth 2 (two) times daily.   zolpidem (AMBIEN) 5 MG tablet Take 5 mg by mouth at bedtime.   sodium bicarbonate 650 MG tablet Take 1,300 mg by mouth 2 (two) times daily.  (Patient not taking: Reported on 06/24/2021)   No facility-administered encounter medications on file as of 06/24/2021.    Allergies (verified) Mushroom extract complex and Amoxicillin   History: Past Medical History:  Diagnosis Date   Anemia    ESRD on peritoneal dialysis ([facility])    Hypertension    Nephrotic syndrome    Past Surgical History:  Procedure Laterality Date   CAPD INSERTION N/A 01/06/2018   Procedure: LAPAROSCOPIC INSERTION CONTINUOUS AMBULATORY PERITONEAL DIALYSIS  (CAPD) CATHETER;  Surgeon: Katha Cabal, MD;  Location: [facility];  Service: Vascular;  Laterality: N/A;   CHOLECYSTECTOMY     CYSTOSCOPY W/ URETERAL STENT REMOVAL Right 10/23/2017   Procedure: CYSTOSCOPY WITH STENT REPLACEMENT;  Surgeon: [name], MD;  Location: [facility];  Service: Urology;  Laterality: Right;   CYSTOSCOPY  WITH STENT PLACEMENT Right 09/30/2017   Procedure: CYSTOSCOPY WITH STENT PLACEMENT;  Surgeon: [name], MD;  Location: [facility];  Service: Urology;  Laterality: Right;   CYSTOSCOPY/RETROGRADE/URETEROSCOPY Right 10/23/2017   Procedure: CYSTOSCOPY/RETROGRADE/URETEROSCOPY;  Surgeon: [name], MD;  Location: [facility];  Service: Urology;  Laterality: Right;   ESOPHAGOGASTRODUODENOSCOPY N/A 03/07/2021   Procedure: ESOPHAGOGASTRODUODENOSCOPY  (EGD);  Surgeon: Toledo, Benay Pike, MD;  Location: [facility];  Service: Gastroenterology;  Laterality: N/A;   RENAL BIOPSY     TEMPORARY DIALYSIS CATHETER N/A 12/07/2020   Procedure: TEMPORARY DIALYSIS CATHETER;  Surgeon: Katha Cabal, MD;  Location: [facility];  Service: Cardiovascular;  Laterality: N/A;   Family History  Problem Relation Age of Onset   Diabetes Mother    Kidney disease Mother        mother on dialysis   CAD Mother    Diabetes Sister    CAD Sister    Social History   Socioeconomic History   Marital status: Single    Spouse name: Not on file   Number of children: Not on file   Years of education: Not on file   Highest education level: Not on file  Occupational History   Not on file  Tobacco Use   Smoking status: Heavy Smoker    Packs/day: 0.50    Years: 20.00    Pack years: 10.00    Types: Cigarettes    Last attempt to quit: 08/22/2017    Years since quitting: 3.8   Smokeless tobacco: Never  Vaping Use   Vaping Use: Never used  Substance and Sexual Activity   Alcohol use: Not Currently    Comment: occ. alcohol use   Drug use: No   Sexual activity: Yes  Other Topics Concern   Not on file  Social History Narrative   Not on file   Social Determinants of Health   Financial Resource Strain: Low Risk    Difficulty of Paying Living Expenses: Not hard at all  Food Insecurity: No Food Insecurity   Worried About Charity fundraiser in the Last Year: Never true   [place] in the Last Year: Never true  Transportation Needs: No Transportation Needs   Lack of Transportation (Medical): No   Lack of Transportation (Non-Medical): No  Physical Activity: Inactive   Days of Exercise per Week: 0 days   Minutes of Exercise per Session: 0 min  Stress: No Stress Concern Present   Feeling of Stress : Only a little  Social Connections: Not on file    Tobacco Counseling Ready to quit: Not Answered Counseling given: Not  Answered   Clinical Intake:  Pre-visit preparation completed: Yes  Pain : No/denies pain     Nutritional Status: BMI of 19-24  Normal Nutritional Risks: Nausea/ vomitting/ diarrhea (diarrhea past few days, seeing a gastrologisy on the 10th) Diabetes: No  How often do you need to have someone help you when you read instructions, pamphlets, or other written materials from your doctor or pharmacy?: 1 - Never What is the last grade level you completed in school?: GED  Diabetic? no  Interpreter Needed?: No  Information entered by :: NAllen LPN   Activities of Daily Living In your present state of health, do you have any difficulty performing the following activities: 06/24/2021 03/14/2021  Hearing? N Y  Comment - fluid in ears  Vision? N N  Difficulty concentrating or making decisions? N N  Walking or climbing stairs?  N N  Dressing or bathing? N N  Doing errands, shopping? N N  Preparing Food and eating ? N -  Using the Toilet? N -  In the past six months, have you accidently leaked urine? N -  Do you have problems with loss of bowel control? N -  Managing your Medications? N -  Managing your Finances? N -  Housekeeping or managing your Housekeeping? N -  Some recent data might be hidden    Patient Care Team: Jon Billings, NP as PCP - General (Nurse Practitioner)  Indicate any recent Medical Services you may have received from other than [facility] providers in the past year (date may be approximate).     Assessment:   This is a routine wellness examination for [facility].  Hearing/Vision screen Vision Screening - Comments:: No regular eye exams,  Dietary issues and exercise activities discussed: Current Exercise Habits: The patient does not participate in regular exercise at present   Goals Addressed             This Visit's Progress    Patient Stated       06/24/2021, wants to get on transplant list       Depression Screen PHQ 2/9 Scores 06/24/2021 03/14/2021   PHQ - 2 Score 0 0    Fall Risk Fall Risk  06/24/2021 03/14/2021  Falls in the past year? 0 0  Number falls in past yr: - 0  Injury with Fall? - 0  Risk for fall due to : Medication side effect No Fall Risks  Follow up Falls evaluation completed;Education provided;Falls prevention discussed Falls evaluation completed    FALL RISK PREVENTION PERTAINING TO THE HOME:  Any stairs in or around the home? No  If so, are there any without handrails? N/a Home free of loose throw rugs in walkways, pet beds, electrical cords, etc? Yes  Adequate lighting in your home to reduce risk of falls? Yes   ASSISTIVE DEVICES UTILIZED TO PREVENT FALLS:  Life alert? No  Use of a cane, walker or w/c? No  Grab bars in the bathroom? No  Shower chair or bench in shower? No  Elevated toilet seat or a handicapped toilet? No   TIMED UP AND GO:  Was the test performed? No .      Cognitive Function:     6CIT Screen 06/24/2021  What Year? 0 points  What month? 0 points  What time? 0 points  Count back from 20 0 points  Months in reverse 4 points  Repeat phrase 4 points  Total Score 8    Immunizations Immunization History  Administered Date(s) Administered   Hepatitis B, adult 05/29/2021   Influenza-Unspecified 06/22/2017, 07/06/2018, 06/27/2019, 07/27/2020, 05/29/2021   Moderna Sars-Covid-2 Vaccination 11/07/2019, 12/05/2019, 09/27/2020   PPD Test 01/31/2019, 06/27/2019, 07/03/2020   Pneumococcal Polysaccharide-23 03/02/2018    TDAP status: Due, Education has been provided regarding the importance of this vaccine. Advised may receive this vaccine at local pharmacy or Health Dept. Aware to provide a copy of the vaccination record if obtained from local pharmacy or Health Dept. Verbalized acceptance and understanding.  Flu Vaccine status: Up to date  Pneumococcal vaccine status: Up to date  Covid-19 vaccine status: Completed vaccines  Qualifies for Shingles Vaccine? Yes   Zostavax  completed No   Shingrix Completed?: No.    Education has been provided regarding the importance of this vaccine. Patient has been advised to call insurance company to determine out of pocket expense if they  have not yet received this vaccine. Advised may also receive vaccine at local pharmacy or Health Dept. Verbalized acceptance and understanding.  Screening Tests Health Maintenance  Topic Date Due   Zoster Vaccines- Shingrix (1 of 2) Never done   COLONOSCOPY (Pts 45-73yr Insurance coverage will need to be confirmed)  Never done   COVID-19 Vaccine (4 - Booster for Moderna series) 01/25/2021   Hepatitis C Screening  03/14/2022 (Originally [birth year]/28/1984)   TETANUS/TDAP  03/18/2022 (Originally 11/28/1985)   INFLUENZA VACCINE  Completed   HIV Screening  Completed   HPV VACCINES  Aged Out    Health Maintenance  Health Maintenance Due  Topic Date Due   Zoster Vaccines- Shingrix (1 of 2) Never done   COLONOSCOPY (Pts 45-473yrInsurance coverage will need to be confirmed)  Never done   COVID-19 Vaccine (4 - Booster for Moderna series) 01/25/2021    Colorectal cancer screening: Type of screening: Colonoscopy. Completed 2020. Repeat every 5 years  Lung Cancer Screening: (Low Dose CT Chest recommended if Age 54-80ears, 30 pack-year currently smoking OR have quit w/in 15years.) does not qualify.   Lung Cancer Screening Referral: no  Additional Screening:  Hepatitis C Screening: does qualify;   Vision Screening: Recommended annual ophthalmology exams for early detection of glaucoma and other disorders of the eye. Is the patient up to date with their annual eye exam?  No  Who is the provider or what is the name of the office in which the patient attends annual eye exams? none If pt is not established with a provider, would they like to be referred to a provider to establish care? No .   Dental Screening: Recommended annual dental exams for proper oral hygiene  Community Resource Referral /  Chronic Care Management: CRR required this visit?  No   CCM required this visit?  No      Plan:     I have personally reviewed and noted the following in the patient's chart:   Medical and social history Use of alcohol, tobacco or illicit drugs  Current medications and supplements including opioid prescriptions. Patient is not currently taking opioid prescriptions. Functional ability and status Nutritional status Physical activity Advanced directives List of other physicians Hospitalizations, surgeries, and ER visits in previous 12 months Vitals Screenings to include cognitive, depression, and falls Referrals and appointments  In addition, I have reviewed and discussed with patient certain preventive protocols, quality metrics, and best practice recommendations. A written personalized care plan for preventive services as well as general preventive health recommendations were provided to patient.     NiKellie SimmeringLPN   [id number] Nurse Notes:

## 2021-07-01 LAB — BLOOD GAS, ARTERIAL
Acid-base deficit: 14.1 mmol/L — ABNORMAL HIGH (ref 0.0–2.0)
Bicarbonate: 12 mmol/L — ABNORMAL LOW (ref 20.0–28.0)
Delivery systems: POSITIVE
Expiratory PAP: 6
FIO2: 0.35
Inspiratory PAP: 16
O2 Saturation: 99 %
Patient temperature: 37
pCO2 arterial: 28 mmHg — ABNORMAL LOW (ref 32.0–48.0)
pH, Arterial: 7.24 — ABNORMAL LOW (ref 7.350–7.450)
pO2, Arterial: 154 mmHg — ABNORMAL HIGH (ref 83.0–108.0)

## 2021-07-08 DIAGNOSIS — R310 Gross hematuria: Secondary | ICD-10-CM | POA: Diagnosis not present

## 2021-07-09 ENCOUNTER — Ambulatory Visit (INDEPENDENT_AMBULATORY_CARE_PROVIDER_SITE_OTHER): Payer: Medicare Other | Admitting: Nurse Practitioner

## 2021-07-09 ENCOUNTER — Ambulatory Visit
Admission: RE | Admit: 2021-07-09 | Discharge: 2021-07-09 | Disposition: A | Discharge status: Home / Self Care | Payer: Medicare Other | Source: Ambulatory Visit | Attending: Nurse Practitioner | Admitting: Nurse Practitioner

## 2021-07-09 ENCOUNTER — Other Ambulatory Visit: Payer: Self-pay

## 2021-07-09 ENCOUNTER — Encounter: Payer: Self-pay | Admitting: Nurse Practitioner

## 2021-07-09 ENCOUNTER — Ambulatory Visit
Admission: RE | Admit: 2021-07-09 | Discharge: 2021-07-09 | Disposition: A | Discharge status: Home / Self Care | Payer: Medicare Other | Attending: Nurse Practitioner | Admitting: Nurse Practitioner

## 2021-07-09 VITALS — BP 143/93 | HR 86 | Temp 98.0°F | Wt 138.0 lb

## 2021-07-09 DIAGNOSIS — R0602 Shortness of breath: Secondary | ICD-10-CM | POA: Insufficient documentation

## 2021-07-09 DIAGNOSIS — J069 Acute upper respiratory infection, unspecified: Secondary | ICD-10-CM | POA: Diagnosis not present

## 2021-07-09 DIAGNOSIS — R051 Acute cough: Secondary | ICD-10-CM

## 2021-07-09 MED ORDER — AZITHROMYCIN 250 MG PO TABS: ORAL_TABLET | ORAL | 0 refills | Status: DC

## 2021-07-09 MED ORDER — PREDNISONE 10 MG PO TABS: 10.0000 mg | ORAL_TABLET | Freq: Every day | ORAL | 0 refills | Status: DC

## 2021-07-09 NOTE — Progress Notes (Signed)
BP (!) 143/93 (BP Location: Left Arm, Cuff Size: Normal)   Pulse 86   Temp 98 F (36.7 C) (Oral)   Wt 138 lb (62.6 kg)   SpO2 98%   BMI 22.27 kg/m    Subjective:    Patient ID: Larry [name], male    DOB: Jul 16, 1966, 55 y.o.   MRN: [id number]  HPI: RAYSEAN CATINELLA is a 55 y.o. male  Chief Complaint  Patient presents with   Cough    Pt states he started having a cough and SOB yesterday, states he feels the same as he did when he was getting pneumonia   Diarrhea    Pt states he started having diarrhea yesterday. States he had a CT scan with dye and wonders if it came from this     Patient states he is having some SOB, cough, fatigue, congestion, and diarrhea.  States it got worse yesterday.    Denies fever.  States he did not sleep well last night due to the coughing.  It is better when he sits up.  States his congestion isn't draining. It just stays in the back of his throat.    States he is going back to [place] after this appointment to get his PD catheter tested for peritoneal dialysis.  Relevant past medical, surgical, family and social history reviewed and updated as indicated. Interim medical history since our last visit reviewed. Allergies and medications reviewed and updated.  Review of Systems  Constitutional:  Positive for fatigue and unexpected weight change. Negative for fever.  HENT:  Positive for congestion.   Respiratory:  Positive for cough and shortness of breath.   Gastrointestinal:  Positive for diarrhea.   Per HPI unless specifically indicated above     Objective:    BP (!) 143/93 (BP Location: Left Arm, Cuff Size: Normal)   Pulse 86   Temp 98 F (36.7 C) (Oral)   Wt 138 lb (62.6 kg)   SpO2 98%   BMI 22.27 kg/m   Wt Readings from Last 3 Encounters:  07/09/21 138 lb (62.6 kg)  06/24/21 140 lb (63.5 kg)  06/12/21 135 lb 3.2 oz (61.3 kg)    Physical Exam Vitals and nursing note reviewed.  Constitutional:      General: He is not in acute  distress.    Appearance: Normal appearance. He is not ill-appearing, toxic-appearing or diaphoretic.  HENT:     Head: Normocephalic.     Right Ear: External ear normal.     Left Ear: External ear normal.     Nose: Nose normal. No congestion or rhinorrhea.     Mouth/Throat:     Mouth: Mucous membranes are moist.  Eyes:     General:        Right eye: No discharge.        Left eye: No discharge.     Extraocular Movements: Extraocular movements intact.     Conjunctiva/sclera: Conjunctivae normal.     Pupils: Pupils are equal, round, and reactive to light.  Cardiovascular:     Rate and Rhythm: Normal rate and regular rhythm.     Heart sounds: No murmur heard. Pulmonary:     Effort: Pulmonary effort is normal. No respiratory distress.     Breath sounds: Decreased air movement present. Examination of the left-upper field reveals decreased breath sounds. Examination of the left-middle field reveals decreased breath sounds. Examination of the left-lower field reveals decreased breath sounds. Decreased breath sounds present. No wheezing, rhonchi  or rales.  Abdominal:     General: Abdomen is flat. Bowel sounds are normal.  Musculoskeletal:     Cervical back: Normal range of motion and neck supple.  Skin:    General: Skin is warm and dry.     Capillary Refill: Capillary refill takes less than 2 seconds.  Neurological:     General: No focal deficit present.     Mental Status: He is alert and oriented to person, place, and time.  Psychiatric:        Mood and Affect: Mood normal.        Behavior: Behavior normal.        Thought Content: Thought content normal.        Judgment: Judgment normal.    Results for orders placed or performed in visit on 06/12/21  Cdiff NAA+O+P+Stool Culture   Specimen: Stool   ST  Result Value Ref Range   Salmonella/Shigella Screen Final report    Stool Culture result 1 (RSASHR) Comment    Campylobacter Culture Final report    Stool Culture result 1 (CMPCXR)  Comment    E coli, Shiga toxin Assay Negative Negative   OVA + PARASITE EXAM Final report    O&P result 1 Comment    Toxigenic C. Difficile by PCR Negative Negative      Assessment & Plan:   Problem List Items Addressed This Visit   None Visit Diagnoses     Viral upper respiratory tract infection    -  Primary   Will treat wth prednisone and zpak due to recent hospitalization for pneumonia. Will obtain CBC and xray to rule out pneumonia. Discussed s/s to go to ER or RTC   Relevant Medications   azithromycin (ZITHROMAX) 250 MG tablet   SOB (shortness of breath)       Relevant Orders   DG Chest 2 View   CBC w/Diff   Acute cough       Relevant Orders   DG Chest 2 View   CBC w/Diff        Follow up plan: Return if symptoms worsen or fail to improve.

## 2021-07-10 ENCOUNTER — Telehealth: Payer: Self-pay

## 2021-07-10 LAB — CBC WITH DIFFERENTIAL/PLATELET
Basophils Absolute: 0.1 10*3/uL (ref 0.0–0.2)
Basos: 1 %
EOS (ABSOLUTE): 1.1 10*3/uL — ABNORMAL HIGH (ref 0.0–0.4)
Eos: 10 %
Hematocrit: 26.4 % — ABNORMAL LOW (ref 37.5–51.0)
Hemoglobin: 8.7 g/dL — ABNORMAL LOW (ref 13.0–17.7)
Immature Grans (Abs): 0.1 10*3/uL (ref 0.0–0.1)
Immature Granulocytes: 1 %
Lymphocytes Absolute: 1.4 10*3/uL (ref 0.7–3.1)
Lymphs: 14 %
MCH: 28.1 pg (ref 26.6–33.0)
MCHC: 33 g/dL (ref 31.5–35.7)
MCV: 85 fL (ref 79–97)
Monocytes Absolute: 0.8 10*3/uL (ref 0.1–0.9)
Monocytes: 8 %
Neutrophils Absolute: 6.9 10*3/uL (ref 1.4–7.0)
Neutrophils: 66 %
Platelets: 256 10*3/uL (ref 150–450)
RBC: 3.1 x10E6/uL — ABNORMAL LOW (ref 4.14–5.80)
RDW: 14.4 % (ref 11.6–15.4)
WBC: 10.3 10*3/uL (ref 3.4–10.8)

## 2021-07-10 NOTE — Progress Notes (Signed)
Please let patient know that his chest xray did not show any acute abnormality.

## 2021-07-10 NOTE — Progress Notes (Signed)
Please let patient know that his white blood cell count is within normal limits.  He still has some anemia which is expected with his chronic kidney disease.  Follow up as discussed.

## 2021-07-10 NOTE — Telephone Encounter (Signed)
-----   Message from Jon Billings, NP sent at 07/10/2021  9:27 AM EDT ----- Please let patient know that his chest xray did not show any acute abnormality.

## 2021-07-10 NOTE — Telephone Encounter (Signed)
Patient aware of results and recommendations. °

## 2021-07-11 DIAGNOSIS — D494 Neoplasm of unspecified behavior of bladder: Secondary | ICD-10-CM | POA: Diagnosis not present

## 2021-07-11 DIAGNOSIS — Z6823 Body mass index (BMI) 23.0-23.9, adult: Secondary | ICD-10-CM | POA: Diagnosis not present

## 2021-07-11 DIAGNOSIS — I120 Hypertensive chronic kidney disease with stage 5 chronic kidney disease or end stage renal disease: Secondary | ICD-10-CM | POA: Diagnosis not present

## 2021-07-11 DIAGNOSIS — N3289 Other specified disorders of bladder: Secondary | ICD-10-CM | POA: Diagnosis not present

## 2021-07-11 DIAGNOSIS — N186 End stage renal disease: Secondary | ICD-10-CM | POA: Diagnosis not present

## 2021-07-11 DIAGNOSIS — Z79899 Other long term (current) drug therapy: Secondary | ICD-10-CM | POA: Diagnosis not present

## 2021-07-11 DIAGNOSIS — R69 Illness, unspecified: Secondary | ICD-10-CM | POA: Diagnosis not present

## 2021-07-11 DIAGNOSIS — R399 Unspecified symptoms and signs involving the genitourinary system: Secondary | ICD-10-CM | POA: Diagnosis not present

## 2021-07-12 ENCOUNTER — Encounter: Payer: Self-pay | Admitting: Nurse Practitioner

## 2021-07-12 ENCOUNTER — Other Ambulatory Visit: Payer: Self-pay

## 2021-07-12 ENCOUNTER — Ambulatory Visit (INDEPENDENT_AMBULATORY_CARE_PROVIDER_SITE_OTHER): Payer: Medicare Other | Admitting: Nurse Practitioner

## 2021-07-12 ENCOUNTER — Ambulatory Visit: Payer: Self-pay | Admitting: *Deleted

## 2021-07-12 VITALS — BP 185/111 | HR 92 | Temp 97.0°F | Ht 66.0 in | Wt 138.0 lb

## 2021-07-12 DIAGNOSIS — R69 Illness, unspecified: Secondary | ICD-10-CM | POA: Diagnosis not present

## 2021-07-12 DIAGNOSIS — G4700 Insomnia, unspecified: Secondary | ICD-10-CM

## 2021-07-12 DIAGNOSIS — J40 Bronchitis, not specified as acute or chronic: Secondary | ICD-10-CM | POA: Diagnosis not present

## 2021-07-12 DIAGNOSIS — F17219 Nicotine dependence, cigarettes, with unspecified nicotine-induced disorders: Secondary | ICD-10-CM

## 2021-07-12 DIAGNOSIS — R0602 Shortness of breath: Secondary | ICD-10-CM

## 2021-07-12 MED ORDER — TRAZODONE HCL 50 MG PO TABS: 25.0000 mg | ORAL_TABLET | Freq: Every evening | ORAL | 1 refills | Status: DC | PRN

## 2021-07-12 MED ORDER — DOXYCYCLINE HYCLATE 100 MG PO TABS: 100.0000 mg | ORAL_TABLET | Freq: Two times a day (BID) | ORAL | 0 refills | Status: DC

## 2021-07-12 MED ORDER — IPRATROPIUM-ALBUTEROL 0.5-2.5 (3) MG/3ML IN SOLN: 3.0000 mL | Freq: Once | RESPIRATORY_TRACT | Status: AC

## 2021-07-12 MED ORDER — ALBUTEROL SULFATE HFA 108 (90 BASE) MCG/ACT IN AERS: 2.0000 | INHALATION_SPRAY | Freq: Four times a day (QID) | RESPIRATORY_TRACT | 1 refills | Status: DC | PRN

## 2021-07-12 NOTE — Telephone Encounter (Signed)
Patient is calling to report he was seen in office on 10/18 for URI and started treatment. Patient reports he is not improving. He has started with cough and is unable to lay down at night. Patient would like to discuss other medications- he is fearful of being admitted to hospital again.

## 2021-07-12 NOTE — Patient Instructions (Signed)
Start flonase daily Stop z-pak, start doxycyline You can use the albuterol inhaler every 6 hours as needed for shortness of breath

## 2021-07-12 NOTE — Progress Notes (Signed)
Acute Office Visit  Subjective:    Patient ID: Larry [name], male    DOB: [birth year], 55 y.o.   MRN: [id number]  Chief Complaint  Patient presents with   Cough    HPI Patient is in today for cough and post nasal drip since Monday. He states that the z-pak doesn't normally help him and he has to be careful with the prednisone because it causes swelling. Was in the hospital about a month ago with pneumonia and doesn't want to end up back in there. He smokes about 1 ppd.   UPPER RESPIRATORY TRACT INFECTION  Worst symptom: cough Fever: no Cough: yes Shortness of breath: yes - when laying down Wheezing: no Chest pain: no Chest tightness: no Chest congestion: no Nasal congestion: yes Runny nose: no Post nasal drip: yes Sneezing: no Sore throat: yes Swollen glands: no Sinus pressure: no Headache: no Face pain: no Toothache: no Ear pain: no  Ear pressure: no  Eyes red/itching:no Eye drainage/crusting: no  Vomiting: yes Rash: no Fatigue: yes Sick contacts: no Strep contacts: no  Context: worse Recurrent sinusitis: no Relief with OTC cold/cough medications: yes  Treatments attempted: mucinex and anti-histamine    Past Medical History:  Diagnosis Date   Anemia    ESRD on peritoneal dialysis ([place])    Hypertension    Nephrotic syndrome     Past Surgical History:  Procedure Laterality Date   CAPD INSERTION N/A 01/06/2018   Procedure: LAPAROSCOPIC INSERTION CONTINUOUS AMBULATORY PERITONEAL DIALYSIS  (CAPD) CATHETER;  Surgeon: Katha Cabal, MD;  Location: [facility];  Service: Vascular;  Laterality: N/A;   CHOLECYSTECTOMY     CYSTOSCOPY W/ URETERAL STENT REMOVAL Right 10/23/2017   Procedure: CYSTOSCOPY WITH STENT REPLACEMENT;  Surgeon: [name], MD;  Location: [facility];  Service: Urology;  Laterality: Right;   CYSTOSCOPY WITH STENT PLACEMENT Right 09/30/2017   Procedure: CYSTOSCOPY WITH STENT PLACEMENT;  Surgeon: [name], MD;  Location: [facility];   Service: Urology;  Laterality: Right;   CYSTOSCOPY/RETROGRADE/URETEROSCOPY Right 10/23/2017   Procedure: CYSTOSCOPY/RETROGRADE/URETEROSCOPY;  Surgeon: [name], MD;  Location: [facility];  Service: Urology;  Laterality: Right;   ESOPHAGOGASTRODUODENOSCOPY N/A 03/07/2021   Procedure: ESOPHAGOGASTRODUODENOSCOPY (EGD);  Surgeon: Toledo, Benay Pike, MD;  Location: [facility];  Service: Gastroenterology;  Laterality: N/A;   RENAL BIOPSY     TEMPORARY DIALYSIS CATHETER N/A 12/07/2020   Procedure: TEMPORARY DIALYSIS CATHETER;  Surgeon: Katha Cabal, MD;  Location: [facility];  Service: Cardiovascular;  Laterality: N/A;    Family History  Problem Relation Age of Onset   Diabetes Mother    Kidney disease Mother        mother on dialysis   CAD Mother    Diabetes Sister    CAD Sister     Social History   Socioeconomic History   Marital status: Single    Spouse name: Not on file   Number of children: Not on file   Years of education: Not on file   Highest education level: Not on file  Occupational History   Not on file  Tobacco Use   Smoking status: Heavy Smoker    Packs/day: 1.00    Years: 20.00    Pack years: 20.00    Types: Cigarettes    Last attempt to quit: 08/22/2017    Years since quitting: 3.8   Smokeless tobacco: Never  Vaping Use   Vaping Use: Never used  Substance and Sexual Activity  Alcohol use: Not Currently   Drug use: No   Sexual activity: Yes  Other Topics Concern   Not on file  Social History Narrative   Not on file   Social Determinants of Health   Financial Resource Strain: Low Risk    Difficulty of Paying Living Expenses: Not hard at all  Food Insecurity: No Food Insecurity   Worried About Charity fundraiser in the Last Year: Never true   [place] in the Last Year: Never true  Transportation Needs: No Transportation Needs   Lack of Transportation (Medical): No   Lack of Transportation (Non-Medical): No  Physical  Activity: Inactive   Days of Exercise per Week: 0 days   Minutes of Exercise per Session: 0 min  Stress: No Stress Concern Present   Feeling of Stress : Only a little  Social Connections: Not on file  Intimate Partner Violence: Not on file    Outpatient Medications Prior to Visit  Medication Sig Dispense Refill   acetaminophen (TYLENOL) 500 MG tablet Take 500-1,000 mg by mouth every 6 (six) hours as needed for mild pain or moderate pain.     amLODipine (NORVASC) 10 MG tablet Take 1 tablet (10 mg total) by mouth daily. 90 tablet 3   carvedilol (COREG) 25 MG tablet Take 25 mg by mouth 2 (two) times daily.     folic acid (FOLVITE) 1 MG tablet Take 1 mg by mouth daily.     losartan (COZAAR) 100 MG tablet Take 100 mg by mouth daily.     montelukast (SINGULAIR) 10 MG tablet Take 1 tablet (10 mg total) by mouth at bedtime. 60 tablet 0   ondansetron (ZOFRAN) 4 MG tablet Take 1 tablet (4 mg total) by mouth daily as needed for nausea or vomiting. 10 tablet 0   pantoprazole (PROTONIX) 40 MG tablet Take 1 tablet (40 mg total) by mouth 2 (two) times daily. 60 tablet 0   predniSONE (DELTASONE) 10 MG tablet Take 1 tablet (10 mg total) by mouth daily with breakfast. Take 6 tabs today, 5 tomorrow, 4 the next day, and decrease by 1 each day until course is complete. 21 tablet 0   sodium bicarbonate 650 MG tablet Take 1,300 mg by mouth 2 (two) times daily.     tamsulosin (FLOMAX) 0.4 MG CAPS capsule Take 1 tablet by mouth daily.     azithromycin (ZITHROMAX) 250 MG tablet Take 2 tablets on day 1, then 1 tablet daily on days 2 through 5 6 tablet 0   zolpidem (AMBIEN) 5 MG tablet Take 5 mg by mouth at bedtime.     No facility-administered medications prior to visit.    Allergies  Allergen Reactions   Mushroom Extract Complex     Headache, migraines   Amoxicillin Nausea And Vomiting    Weakness  Has patient had a PCN reaction causing immediate rash, facial/tongue/throat swelling, SOB or lightheadedness  with hypotension: No Has patient had a PCN reaction causing severe rash involving mucus membranes or skin necrosis: No Has patient had a PCN reaction that required hospitalization: No Has patient had a PCN reaction occurring within the last 10 years: No If all of the above answers are "NO", then may proceed with Cephalosporin use.     Review of Systems  Constitutional:  Positive for fatigue. Negative for fever.  HENT:  Positive for congestion, postnasal drip and sore throat. Negative for ear pain, rhinorrhea and sinus pressure.   Eyes: Negative.   Respiratory:  Positive for cough and shortness of breath.   Cardiovascular: Negative.   Gastrointestinal: Negative.   Genitourinary: Negative.   Musculoskeletal: Negative.   Skin: Negative.   Neurological: Negative.       Objective:    Physical Exam Vitals and nursing note reviewed.  Constitutional:      Appearance: Normal appearance.  HENT:     Head: Normocephalic.  Eyes:     Conjunctiva/sclera: Conjunctivae normal.  Cardiovascular:     Rate and Rhythm: Normal rate and regular rhythm.     Pulses: Normal pulses.     Heart sounds: Normal heart sounds.  Pulmonary:     Effort: Pulmonary effort is normal.     Breath sounds: Normal breath sounds.  Abdominal:     Palpations: Abdomen is soft.     Tenderness: There is no abdominal tenderness.  Musculoskeletal:     Cervical back: Normal range of motion and neck supple. No tenderness.  Lymphadenopathy:     Cervical: No cervical adenopathy.  Skin:    General: Skin is warm.  Neurological:     General: No focal deficit present.     Mental Status: He is alert and oriented to person, place, and time.  Psychiatric:        Mood and Affect: Mood normal.        Behavior: Behavior normal.        Thought Content: Thought content normal.        Judgment: Judgment normal.    BP (!) 185/111 Comment: took medication 20 min ago  Pulse 92   Temp (!) 97 F (36.1 C)   Ht _0  (1.676 m)    Wt 138 lb (62.6 kg)   SpO2 97%   BMI 22.27 kg/m  Wt Readings from Last 3 Encounters:  07/12/21 138 lb (62.6 kg)  07/09/21 138 lb (62.6 kg)  06/24/21 140 lb (63.5 kg)    Health Maintenance Due  Topic Date Due   Zoster Vaccines- Shingrix (1 of 2) Never done   COLONOSCOPY (Pts 45-72yr Insurance coverage will need to be confirmed)  Never done   COVID-19 Vaccine (4 - Booster for Moderna series) 11/22/2020    There are no preventive care reminders to display for this patient.   Lab Results  Component Value Date   TSH 1.151 12/08/2020   Lab Results  Component Value Date   WBC 10.3 07/09/2021   HGB 8.7 (L) 07/09/2021   HCT 26.4 (L) 07/09/2021   MCV 85 07/09/2021   PLT 256 07/09/2021   Lab Results  Component Value Date   NA 136 05/13/2021   K 4.9 05/13/2021   CO2 16 (L) 05/13/2021   GLUCOSE 90 05/13/2021   BUN 132 (H) 05/13/2021   CREATININE 18.54 (H) 05/13/2021   BILITOT 0.7 05/03/2021   ALKPHOS 52 05/03/2021   AST 13 (L) 05/03/2021   ALT 11 05/03/2021   PROT 5.4 (L) 05/03/2021   ALBUMIN 2.7 (L) 05/03/2021   CALCIUM 7.4 (L) 05/13/2021   ANIONGAP 19 (H) 05/13/2021   EGFR 4 (L) 03/18/2021   Lab Results  Component Value Date   CHOL 140 12/06/2020   Lab Results  Component Value Date   HDL 27 (L) 12/06/2020   Lab Results  Component Value Date   LDLCALC 82 12/06/2020   Lab Results  Component Value Date   TRIG 154 (H) 12/06/2020   Lab Results  Component Value Date   CHOLHDL 5.2 12/06/2020   Lab Results  Component Value  Date   HGBA1C 6.0 (H) 12/06/2020       Assessment & Plan:   Problem List Items Addressed This Visit       Other   Nicotine dependence    Currently smoking 1ppd. Discussed complete tobacco cessation. He is not interested at this time.       Insomnia    Chronic, ongoing. Currently taking Lorrin Mais, however his insurance only covers 15 day supply. Will change ambien to trazadone 84m-50mg qHS prn. Follow-up in 4 weeks.       Other  Visit Diagnoses     Shortness of breath    -  Primary   Duoneb given in office, symptoms have improved. Oxygen saturation 97%. CXR reviewed from 3 days ago and was negative. Will give albuterol inhaler prn.   Relevant Medications   ipratropium-albuterol (DUONEB) 0.5-2.5 (3) MG/3ML nebulizer solution 3 mL   Bronchitis       Ongoing symptoms, change z-pak to doxycyline. Spirometry reviewed from 04/2021 and was normal. F/U if sympotms worsen or don't improve.         Meds ordered this encounter  Medications   albuterol (VENTOLIN HFA) 108 (90 Base) MCG/ACT inhaler    Sig: Inhale 2 puffs into the lungs every 6 (six) hours as needed for wheezing or shortness of breath.    Dispense:  8 g    Refill:  1   ipratropium-albuterol (DUONEB) 0.5-2.5 (3) MG/3ML nebulizer solution 3 mL   doxycycline (VIBRA-TABS) 100 MG tablet    Sig: Take 1 tablet (100 mg total) by mouth 2 (two) times daily.    Dispense:  20 tablet    Refill:  0   traZODone (DESYREL) 50 MG tablet    Sig: Take 0.5-1 tablets (25-50 mg total) by mouth at bedtime as needed for sleep.    Dispense:  30 tablet    Refill:  1     LCharyl Dancer NP

## 2021-07-12 NOTE — Assessment & Plan Note (Signed)
Chronic, ongoing. Currently taking Lorrin Mais, however his insurance only covers 15 day supply. Will change ambien to trazadone 25mg -50mg  qHS prn. Follow-up in 4 weeks.

## 2021-07-12 NOTE — Assessment & Plan Note (Signed)
Currently smoking 1ppd. Discussed complete tobacco cessation. He is not interested at this time.

## 2021-07-12 NOTE — Telephone Encounter (Signed)
COVID test- this week negative Reason for Disposition . [1] Continuous (nonstop) coughing interferes with work or school AND [2] no improvement using cough treatment per Care Advice  Answer Assessment - Initial Assessment Questions 1. ONSET: "When did the cough begin?"      2 days ago 2. SEVERITY: "How bad is the cough today?"      Keeping patient up at night-has to upright 3. SPUTUM: "Describe the color of your sputum" (none, dry cough; clear, white, yellow, green)     clear 4. HEMOPTYSIS: "Are you coughing up any blood?" If so ask: "How much?" (flecks, streaks, tablespoons, etc.)     no 5. DIFFICULTY BREATHING: "Are you having difficulty breathing?" If Yes, ask: "How bad is it?" (e.g., mild, moderate, severe)    - MILD: No SOB at rest, mild SOB with walking, speaks normally in sentences, can lie down, no retractions, pulse < 100.    - MODERATE: SOB at rest, SOB with minimal exertion and prefers to sit, cannot lie down flat, speaks in phrases, mild retractions, audible wheezing, pulse 100-120.    - SEVERE: Very SOB at rest, speaks in single words, struggling to breathe, sitting hunched forward, retractions, pulse > 120      SOB with laying down-symptoms not getting better on treatment 6. FEVER: "Do you have a fever?" If Yes, ask: "What is your temperature, how was it measured, and when did it start?"     no 7. CARDIAC HISTORY: "Do you have any history of heart disease?" (e.g., heart attack, congestive heart failure)      Kidney disease 8. LUNG HISTORY: "Do you have any history of lung disease?"  (e.g., pulmonary embolus, asthma, emphysema)     no 9. PE RISK FACTORS: "Do you have a history of blood clots?" (or: recent major surgery, recent prolonged travel, bedridden)     no 10. OTHER SYMPTOMS: "Do you have any other symptoms?" (e.g., runny nose, wheezing, chest pain)       Getting worse- more coughing 11. PREGNANCY: "Is there any chance you are pregnant?" "When was your last menstrual  period?"       N/a 12. TRAVEL: "Have you traveled out of the country in the last month?" (e.g., travel history, exposures)       no  Protocols used: Cough - Acute Productive-A-AH

## 2021-07-15 ENCOUNTER — Ambulatory Visit: Payer: Self-pay | Admitting: *Deleted

## 2021-07-15 ENCOUNTER — Telehealth: Payer: Self-pay | Admitting: Nurse Practitioner

## 2021-07-15 ENCOUNTER — Telehealth: Payer: Self-pay | Admitting: *Deleted

## 2021-07-15 ENCOUNTER — Ambulatory Visit: Payer: Medicare Other | Admitting: Nurse Practitioner

## 2021-07-15 MED ORDER — ALBUTEROL SULFATE (2.5 MG/3ML) 0.083% IN NEBU
2.5000 mg | INHALATION_SOLUTION | Freq: Four times a day (QID) | RESPIRATORY_TRACT | 0 refills | Status: DC | PRN
Start: 2021-07-15 — End: 2021-07-16

## 2021-07-15 NOTE — Telephone Encounter (Signed)
Pt called upset. States [facility] as called in earlier. States he wanted it sent to [facility]. NT called [place] and spoke to pharmacist. States to [facility] would take until tomorrow. Advised verbal order. Verbal order given as prescribed by Jon Billings. Pt aware.  After hours call.

## 2021-07-15 NOTE — Telephone Encounter (Signed)
I recommend patient be seen in the ER.

## 2021-07-15 NOTE — Telephone Encounter (Signed)
I sent albuterol nebulizer to the pharmacy for him.  He will need to purchase a nebulizer machine.  If [facility] doesn't [facility] it, please give him information for a medication supply store.  If he is not able to get this, I recommend he be seen in the ER.  If the albuterol does not help, I recommend he is seen in the ER.  This is not a solution for his SOB but can help manage the symptoms.  Due to patient's recent history with pneumonia and sepsis, I recommend he be evaluated in the ER today.

## 2021-07-15 NOTE — Addendum Note (Signed)
Addended by: Jon Billings on: 07/15/2021 03:10 PM   Modules accepted: Orders

## 2021-07-15 NOTE — Telephone Encounter (Signed)
Please see below and advise.

## 2021-07-15 NOTE — Telephone Encounter (Signed)
Reason for Disposition . [1] MODERATE difficulty breathing (e.g., speaks in phrases, SOB even at rest, pulse 100-120) AND [2] NEW-onset or WORSE than normal  Answer Assessment - Initial Assessment Questions 1. RESPIRATORY STATUS: "Describe your breathing?" (e.g., wheezing, shortness of breath, unable to speak, severe coughing)     SOB,wheezing, severe coughing 2. ONSET: "When did this breathing problem begin?"    LAst Monday 3. PATTERN "Does the difficult breathing come and go, or has it been constant since it started?"      Constant 4. SEVERITY: "How bad is your breathing?" (e.g., mild, moderate, severe)    - MILD: No SOB at rest, mild SOB with walking, speaks normally in sentences, can lie down, no retractions, pulse < 100.    - MODERATE: SOB at rest, SOB with minimal exertion and prefers to sit, cannot lie down flat, speaks in phrases, mild retractions, audible wheezing, pulse 100-120.    - SEVERE: Very SOB at rest, speaks in single words, struggling to breathe, sitting hunched forward, retractions, pulse > 120      Moderate 5. RECURRENT SYMPTOM: "Have you had difficulty breathing before?" If Yes, ask: "When was the last time?" and "What happened that time?"      yes 6. CARDIAC HISTORY: "Do you have any history of heart disease?" (e.g., heart attack, angina, bypass surgery, angioplasty)      *No Answer* 7. LUNG HISTORY: "Do you have any history of lung disease?"  (e.g., pulmonary embolus, asthma, emphysema)     *No Answer* 8. CAUSE: "What do you think is causing the breathing problem?"      *No Answer* 9. OTHER SYMPTOMS: "Do you have any other symptoms? (e.g., dizziness, runny nose, cough, chest pain, fever)     Cough, dry. 10. O2 SATURATION MONITOR:  "Do you use an oxygen saturation monitor (pulse oximeter) at home?" If Yes, "What is your reading (oxygen level) today?" "What is your usual oxygen saturation reading?" (e.g., 95%)  Protocols used: Breathing Difficulty-A-AH

## 2021-07-15 NOTE — Telephone Encounter (Signed)
Pt is calling in stating that the [facility] and would like to see if it can be called in to the [facility] they have it stock.

## 2021-07-15 NOTE — Telephone Encounter (Signed)
Pt calling back for disposition. Message from Santiago Glad read to patient. Asking if he could try nebulizer treatments. States "Never declined ED, just wanted to try something else first. "   States "I know some people can do that at home." Reiterated need for thorough ED eval and the fact results available quicker in ED to proceed with appropriate treatment. States "Just ask about the nebs treatment, not as bad as before when I went to ED."  States " It will cost thousands of dollars." Assured pt NT would route to practice and again noted ED recommendation.  CB# [phone]

## 2021-07-15 NOTE — Telephone Encounter (Signed)
Pt reports worsening SOB, onset yesterday. Seen in [place] last Tuesday and Friday; CXR negative. Started Doxy Friday, has inhaler. "Inhaler doesn't do a thing." Pt states wheezing, cannot lie flat at night. SOB with exertion, "More SOB walking down driveway." Coughing constantly during call. States productive at times for clear mucous. States tested negative for covid 2 days ago.  Denies SOB with speaking but obvious during call. Advised ED, pt declined "I thought you all have doctors, you're supposed to help me."  NT called practice, [facility], for consult. Advised to route summary for review.  Pt advised, CB# verified: [phone].

## 2021-07-15 NOTE — Telephone Encounter (Signed)
Spoke with patient and made him aware of Jon Billings, NP recommendations. Patient verbalized that he would go and see what the pharmacy says and if he has any concerns or questions regarding the nebulizer machine, that he would give our office a call back. Patient verbalized understanding and has no further questions at this time. FYI

## 2021-07-16 MED ORDER — ALBUTEROL SULFATE HFA 108 (90 BASE) MCG/ACT IN AERS: 2.0000 | INHALATION_SPRAY | Freq: Four times a day (QID) | RESPIRATORY_TRACT | 1 refills | Status: AC | PRN

## 2021-07-16 NOTE — Telephone Encounter (Signed)
Medication sent to [facility].

## 2021-07-16 NOTE — Telephone Encounter (Signed)
Prescription has been sent to [facility].

## 2021-07-17 DIAGNOSIS — J189 Pneumonia, unspecified organism: Secondary | ICD-10-CM | POA: Diagnosis not present

## 2021-07-17 DIAGNOSIS — R9431 Abnormal electrocardiogram [ECG] [EKG]: Secondary | ICD-10-CM | POA: Diagnosis not present

## 2021-07-17 DIAGNOSIS — J90 Pleural effusion, not elsewhere classified: Secondary | ICD-10-CM | POA: Diagnosis not present

## 2021-07-17 DIAGNOSIS — J811 Chronic pulmonary edema: Secondary | ICD-10-CM | POA: Diagnosis not present

## 2021-07-17 DIAGNOSIS — R0602 Shortness of breath: Secondary | ICD-10-CM | POA: Diagnosis not present

## 2021-07-18 DIAGNOSIS — I272 Pulmonary hypertension, unspecified: Secondary | ICD-10-CM | POA: Diagnosis not present

## 2021-07-18 DIAGNOSIS — I517 Cardiomegaly: Secondary | ICD-10-CM | POA: Diagnosis not present

## 2021-07-18 DIAGNOSIS — I340 Nonrheumatic mitral (valve) insufficiency: Secondary | ICD-10-CM | POA: Diagnosis not present

## 2021-07-19 ENCOUNTER — Ambulatory Visit: Payer: Medicare Other | Admitting: Nurse Practitioner

## 2021-07-24 ENCOUNTER — Other Ambulatory Visit: Payer: Self-pay | Admitting: Nurse Practitioner

## 2021-07-24 NOTE — Telephone Encounter (Signed)
Medication Refill - Medication: amLODipine (NORVASC) 10 MG tablet (Patient is completley out)   Has the patient contacted their pharmacy? No  (Agent: If no, request that the patient contact the pharmacy for the refill. If patient does not wish to contact the pharmacy document the reason why and proceed with request.) Patient states medication was 5MG  2 x daily and to call PCP when out to ensure she sends in a 10MG    Preferred Pharmacy (with phone number or street name):  [facility], [place] Phone:  [phone]  Fax:  [phone]      Has the patient been seen for an appointment in the last year OR does the patient have an upcoming appointment? Yes.    Agent: Please be advised that RX refills may take up to 3 business days. We ask that you follow-up with your pharmacy.

## 2021-07-24 NOTE — Telephone Encounter (Signed)
Please see below and advise as needed.

## 2021-07-24 NOTE — Telephone Encounter (Signed)
Requested medication (s) are due for refill today: Yes  Requested medication (s) are on the active medication list: Yes  Last refill:  01/16/21  Future visit scheduled: Yes  Notes to clinic:  Unable to refill per protocol, last refill by another provider. Patient would like this refilled, see notes in the encounter below.     Requested Prescriptions  Pending Prescriptions Disp Refills   amLODipine (NORVASC) 10 MG tablet 90 tablet 3    Sig: Take 1 tablet (10 mg total) by mouth daily.     Cardiovascular:  Calcium Channel Blockers Failed - 07/24/2021  3:28 PM      Failed - Last BP in normal range    BP Readings from Last 1 Encounters:  07/12/21 (!) 185/111          Passed - Valid encounter within last 6 months    Recent Outpatient Visits           1 week ago Shortness of breath   [place], Lauren A, NP   2 weeks ago Viral upper respiratory tract infection   [place], NP   1 month ago Diarrhea, unspecified type   [facility] Jon Billings, NP   4 months ago Hospital discharge follow-up   [place], NP   4 months ago ESRD on peritoneal dialysis [facility])   [facility] Jon Billings, NP       Future Appointments             In 2 weeks Jon Billings, NP [facility], [place]   In 38 months  [employer] [facility], [place]

## 2021-07-24 NOTE — Telephone Encounter (Signed)
Pt states that he is having to double up on 5mg  and he is not wanting to do that. [facility] and [facility] sent to [facility] instead. Please advise.

## 2021-07-24 NOTE — Telephone Encounter (Signed)
Patient called, left VM to return the call to the office. Amlodipine originally refilled by cardiologist on 01/16/21 #90/[facility]. Unsure if [facility] aware of [facility] or does he want PCP to start refilling to new pharmacy.

## 2021-07-25 MED ORDER — AMLODIPINE BESYLATE 10 MG PO TABS: 10.0000 mg | ORAL_TABLET | Freq: Every day | ORAL | 0 refills | Status: DC

## 2021-07-25 NOTE — Telephone Encounter (Signed)
Patient is overdue for follow up. 30 day supply sent.

## 2021-08-01 ENCOUNTER — Encounter: Payer: Self-pay | Admitting: Gastroenterology

## 2021-08-01 ENCOUNTER — Ambulatory Visit (INDEPENDENT_AMBULATORY_CARE_PROVIDER_SITE_OTHER): Payer: Self-pay | Admitting: Gastroenterology

## 2021-08-01 VITALS — BP 136/73 | HR 84 | Temp 98.6°F | Ht 66.0 in | Wt 141.2 lb

## 2021-08-01 DIAGNOSIS — K219 Gastro-esophageal reflux disease without esophagitis: Secondary | ICD-10-CM

## 2021-08-01 NOTE — Progress Notes (Signed)
No show

## 2021-08-03 ENCOUNTER — Other Ambulatory Visit: Payer: Self-pay | Admitting: Nurse Practitioner

## 2021-08-03 NOTE — Telephone Encounter (Signed)
Requested medication (s) are due for refill today: no  Requested medication (s) are on the active medication list: yes  Last refill:  07/12/2021 #30/1RF  Future visit scheduled: Yes  Notes to clinic:  pharmacy requesting 90 day supply     Requested Prescriptions  Pending Prescriptions Disp Refills   traZODone (DESYREL) 50 MG tablet [Pharmacy Med Name: TRAZODONE 50 MG TABLET] 90 tablet 1    Sig: TAKE 0.5-1 TABLETS BY MOUTH AT BEDTIME AS NEEDED FOR SLEEP.     Psychiatry: Antidepressants - Serotonin Modulator Passed - 08/03/2021 11:31 AM      Passed - Valid encounter within last 6 months    Recent Outpatient Visits           3 weeks ago Shortness of breath   [place], Lauren A, NP   3 weeks ago Viral upper respiratory tract infection   [place], NP   1 month ago Diarrhea, unspecified type   [facility] Jon Billings, NP   4 months ago Hospital discharge follow-up   [facility] Jon Billings, NP   4 months ago ESRD on peritoneal dialysis [facility])   [facility] Jon Billings, NP       Future Appointments             In 6 days Jon Billings, NP [facility], [place]   In 83 months  [facility], [place]

## 2021-08-03 NOTE — Telephone Encounter (Signed)
Requested Prescriptions  Pending Prescriptions Disp Refills  . montelukast (SINGULAIR) 10 MG tablet [Pharmacy Med Name: MONTELUKAST SOD 10 MG TABLET] 90 tablet 0    Sig: TAKE 1 TABLET BY MOUTH EVERYDAY AT BEDTIME     Pulmonology:  Leukotriene Inhibitors Passed - 08/03/2021 10:25 AM      Passed - Valid encounter within last 12 months    Recent Outpatient Visits          3 weeks ago Shortness of breath   [place], Lauren A, NP   3 weeks ago Viral upper respiratory tract infection   [place], NP   1 month ago Diarrhea, unspecified type   [facility] Jon Billings, NP   4 months ago Hospital discharge follow-up   [facility] Jon Billings, NP   4 months ago ESRD on peritoneal dialysis [facility])   [facility] Jon Billings, NP      Future Appointments            In 6 days Jon Billings, NP [facility], [place]   In 85 months  [employer], [place]

## 2021-08-08 NOTE — Progress Notes (Deleted)
Acute Office Visit  Subjective:    Patient ID: Larry [name], male    DOB: 1965/10/16, 55 y.o.   MRN: [id number]  No chief complaint on file.   HPI Patient is in today for cough and post nasal drip since Monday. He states that the z-pak doesn't normally help him and he has to be careful with the prednisone because it causes swelling. Was in the hospital about a month ago with pneumonia and doesn't want to end up back in there. He smokes about 1 ppd.   UPPER RESPIRATORY TRACT INFECTION  Worst symptom: cough Fever: no Cough: yes Shortness of breath: yes - when laying down Wheezing: no Chest pain: no Chest tightness: no Chest congestion: no Nasal congestion: yes Runny nose: no Post nasal drip: yes Sneezing: no Sore throat: yes Swollen glands: no Sinus pressure: no Headache: no Face pain: no Toothache: no Ear pain: no  Ear pressure: no  Eyes red/itching:no Eye drainage/crusting: no  Vomiting: yes Rash: no Fatigue: yes Sick contacts: no Strep contacts: no  Context: worse Recurrent sinusitis: no Relief with OTC cold/cough medications: yes  Treatments attempted: mucinex and anti-histamine    Past Medical History:  Diagnosis Date  . Anemia   . ESRD on peritoneal dialysis ([place])   . Hypertension   . Nephrotic syndrome     Past Surgical History:  Procedure Laterality Date  . CAPD INSERTION N/A 01/06/2018   Procedure: LAPAROSCOPIC INSERTION CONTINUOUS AMBULATORY PERITONEAL DIALYSIS  (CAPD) CATHETER;  Surgeon: Katha Cabal, MD;  Location: [facility];  Service: Vascular;  Laterality: N/A;  . CHOLECYSTECTOMY    . CYSTOSCOPY W/ URETERAL STENT REMOVAL Right 10/23/2017   Procedure: CYSTOSCOPY WITH STENT REPLACEMENT;  Surgeon: [name], MD;  Location: [facility];  Service: Urology;  Laterality: Right;  . CYSTOSCOPY WITH STENT PLACEMENT Right 09/30/2017   Procedure: CYSTOSCOPY WITH STENT PLACEMENT;  Surgeon: [name], MD;  Location: [facility];  Service:  Urology;  Laterality: Right;  . CYSTOSCOPY/RETROGRADE/URETEROSCOPY Right 10/23/2017   Procedure: CYSTOSCOPY/RETROGRADE/URETEROSCOPY;  Surgeon: [name], MD;  Location: [facility];  Service: Urology;  Laterality: Right;  . ESOPHAGOGASTRODUODENOSCOPY N/A 03/07/2021   Procedure: ESOPHAGOGASTRODUODENOSCOPY (EGD);  Surgeon: Toledo, Benay Pike, MD;  Location: [facility];  Service: Gastroenterology;  Laterality: N/A;  . RENAL BIOPSY    . TEMPORARY DIALYSIS CATHETER N/A 12/07/2020   Procedure: TEMPORARY DIALYSIS CATHETER;  Surgeon: Katha Cabal, MD;  Location: [place] [facility] [facility];  Service: Cardiovascular;  Laterality: N/A;    Family History  Problem Relation Age of Onset  . Diabetes Mother   . Kidney disease Mother        mother on dialysis  . CAD Mother   . Diabetes Sister   . CAD Sister     Social History   Socioeconomic History  . Marital status: Single    Spouse name: Not on file  . Number of children: Not on file  . Years of education: Not on file  . Highest education level: Not on file  Occupational History  . Not on file  Tobacco Use  . Smoking status: Heavy Smoker    Packs/day: 1.00    Years: 20.00    Pack years: 20.00    Types: Cigarettes    Last attempt to quit: 08/22/2017    Years since quitting: 3.9  . Smokeless tobacco: Never  Vaping Use  . Vaping Use: Never used  Substance and Sexual Activity  . Alcohol use: Not Currently  .  Drug use: No  . Sexual activity: Yes  Other Topics Concern  . Not on file  Social History Narrative  . Not on file   Social Determinants of Health   Financial Resource Strain: Low Risk   . Difficulty of Paying Living Expenses: Not hard at all  Food Insecurity: No Food Insecurity  . Worried About Charity fundraiser in the Last Year: Never true  . Ran Out of Food in the Last Year: Never true  Transportation Needs: No Transportation Needs  . Lack of Transportation (Medical): No  . Lack of Transportation (Non-Medical): No   Physical Activity: Inactive  . Days of Exercise per Week: 0 days  . Minutes of Exercise per Session: 0 min  Stress: No Stress Concern Present  . Feeling of Stress : Only a little  Social Connections: Not on file  Intimate Partner Violence: Not on file    Outpatient Medications Prior to Visit  Medication Sig Dispense Refill  . acetaminophen (TYLENOL) 500 MG tablet Take 500-1,000 mg by mouth every 6 (six) hours as needed for mild pain or moderate pain.    Marland Kitchen albuterol (VENTOLIN HFA) 108 (90 Base) MCG/ACT inhaler Inhale 2 puffs into the lungs every 6 (six) hours as needed for wheezing or shortness of breath. 8 g 1  . amLODipine (NORVASC) 10 MG tablet Take 1 tablet (10 mg total) by mouth daily. 30 tablet 0  . carvedilol (COREG) 25 MG tablet Take 25 mg by mouth 2 (two) times daily.    Marland Kitchen losartan (COZAAR) 100 MG tablet Take 100 mg by mouth daily.    . montelukast (SINGULAIR) 10 MG tablet TAKE 1 TABLET BY MOUTH EVERYDAY AT BEDTIME 90 tablet 0  . ondansetron (ZOFRAN) 4 MG tablet Take 1 tablet (4 mg total) by mouth daily as needed for nausea or vomiting. 10 tablet 0  . pantoprazole (PROTONIX) 40 MG tablet Take 1 tablet (40 mg total) by mouth 2 (two) times daily. (Patient not taking: Reported on 08/01/2021) 60 tablet 0  . predniSONE (DELTASONE) 10 MG tablet Take 1 tablet (10 mg total) by mouth daily with breakfast. Take 6 tabs today, 5 tomorrow, 4 the next day, and decrease by 1 each day until course is complete. 21 tablet 0  . tamsulosin (FLOMAX) 0.4 MG CAPS capsule Take 1 tablet by mouth daily.    . traZODone (DESYREL) 50 MG tablet TAKE 0.5-1 TABLETS BY MOUTH AT BEDTIME AS NEEDED FOR SLEEP. 90 tablet 1   Facility-Administered Medications Prior to Visit  Medication Dose Route Frequency Provider Last Rate Last Admin  . ipratropium-albuterol (DUONEB) 0.5-2.5 (3) MG/3ML nebulizer solution 3 mL  3 mL Nebulization Once McElwee, Lauren A, NP        Allergies  Allergen Reactions  . Mushroom Extract  Complex     Headache, migraines  . Amoxicillin Nausea And Vomiting    Weakness  Has patient had a PCN reaction causing immediate rash, facial/tongue/throat swelling, SOB or lightheadedness with hypotension: No Has patient had a PCN reaction causing severe rash involving mucus membranes or skin necrosis: No Has patient had a PCN reaction that required hospitalization: No Has patient had a PCN reaction occurring within the last 10 years: No If all of the above answers are "NO", then may proceed with Cephalosporin use.     Review of Systems  Constitutional:  Positive for fatigue. Negative for fever.  HENT:  Positive for congestion, postnasal drip and sore throat. Negative for ear pain, rhinorrhea and sinus  pressure.   Eyes: Negative.   Respiratory:  Positive for cough and shortness of breath.   Cardiovascular: Negative.   Gastrointestinal: Negative.   Genitourinary: Negative.   Musculoskeletal: Negative.   Skin: Negative.   Neurological: Negative.       Objective:    Physical Exam Vitals and nursing note reviewed.  Constitutional:      Appearance: Normal appearance.  HENT:     Head: Normocephalic.  Eyes:     Conjunctiva/sclera: Conjunctivae normal.  Cardiovascular:     Rate and Rhythm: Normal rate and regular rhythm.     Pulses: Normal pulses.     Heart sounds: Normal heart sounds.  Pulmonary:     Effort: Pulmonary effort is normal.     Breath sounds: Normal breath sounds.  Abdominal:     Palpations: Abdomen is soft.     Tenderness: There is no abdominal tenderness.  Musculoskeletal:     Cervical back: Normal range of motion and neck supple. No tenderness.  Lymphadenopathy:     Cervical: No cervical adenopathy.  Skin:    General: Skin is warm.  Neurological:     General: No focal deficit present.     Mental Status: He is alert and oriented to person, place, and time.  Psychiatric:        Mood and Affect: Mood normal.        Behavior: Behavior normal.         Thought Content: Thought content normal.        Judgment: Judgment normal.    There were no vitals taken for this visit. Wt Readings from Last 3 Encounters:  08/01/21 141 lb 3.2 oz (64 kg)  07/12/21 138 lb (62.6 kg)  07/09/21 138 lb (62.6 kg)    Health Maintenance Due  Topic Date Due  . COLONOSCOPY (Pts 45-21yr Insurance coverage will need to be confirmed)  Never done  . COVID-19 Vaccine (4 - Booster for Moderna series) 11/22/2020    There are no preventive care reminders to display for this patient.   Lab Results  Component Value Date   TSH 1.151 12/08/2020   Lab Results  Component Value Date   WBC 10.3 07/09/2021   HGB 8.7 (L) 07/09/2021   HCT 26.4 (L) 07/09/2021   MCV 85 07/09/2021   PLT 256 07/09/2021   Lab Results  Component Value Date   NA 136 05/13/2021   K 4.9 05/13/2021   CO2 16 (L) 05/13/2021   GLUCOSE 90 05/13/2021   BUN 132 (H) 05/13/2021   CREATININE 18.54 (H) 05/13/2021   BILITOT 0.7 05/03/2021   ALKPHOS 52 05/03/2021   AST 13 (L) 05/03/2021   ALT 11 05/03/2021   PROT 5.4 (L) 05/03/2021   ALBUMIN 2.7 (L) 05/03/2021   CALCIUM 7.4 (L) 05/13/2021   ANIONGAP 19 (H) 05/13/2021   EGFR 4 (L) 03/18/2021   Lab Results  Component Value Date   CHOL 140 12/06/2020   Lab Results  Component Value Date   HDL 27 (L) 12/06/2020   Lab Results  Component Value Date   LDLCALC 82 12/06/2020   Lab Results  Component Value Date   TRIG 154 (H) 12/06/2020   Lab Results  Component Value Date   CHOLHDL 5.2 12/06/2020   Lab Results  Component Value Date   HGBA1C 6.0 (H) 12/06/2020       Assessment & Plan:   Problem List Items Addressed This Visit   None   No orders of the defined types  were placed in this encounter.    Jon Billings, NP

## 2021-08-09 ENCOUNTER — Encounter: Payer: Self-pay | Admitting: Nurse Practitioner

## 2021-08-09 ENCOUNTER — Ambulatory Visit (INDEPENDENT_AMBULATORY_CARE_PROVIDER_SITE_OTHER): Payer: Medicare Other | Admitting: Nurse Practitioner

## 2021-08-09 ENCOUNTER — Other Ambulatory Visit: Payer: Self-pay

## 2021-08-09 VITALS — BP 140/77 | HR 80 | Temp 97.7°F | Ht 66.0 in | Wt 144.6 lb

## 2021-08-09 DIAGNOSIS — I10 Essential (primary) hypertension: Secondary | ICD-10-CM

## 2021-08-09 DIAGNOSIS — G4700 Insomnia, unspecified: Secondary | ICD-10-CM | POA: Diagnosis not present

## 2021-08-09 DIAGNOSIS — I5020 Unspecified systolic (congestive) heart failure: Secondary | ICD-10-CM | POA: Diagnosis not present

## 2021-08-09 DIAGNOSIS — N186 End stage renal disease: Secondary | ICD-10-CM | POA: Diagnosis not present

## 2021-08-09 DIAGNOSIS — Z09 Encounter for follow-up examination after completed treatment for conditions other than malignant neoplasm: Secondary | ICD-10-CM

## 2021-08-09 DIAGNOSIS — R051 Acute cough: Secondary | ICD-10-CM | POA: Diagnosis not present

## 2021-08-09 DIAGNOSIS — E875 Hyperkalemia: Secondary | ICD-10-CM | POA: Diagnosis not present

## 2021-08-09 DIAGNOSIS — Z992 Dependence on renal dialysis: Secondary | ICD-10-CM | POA: Diagnosis not present

## 2021-08-09 DIAGNOSIS — D631 Anemia in chronic kidney disease: Secondary | ICD-10-CM | POA: Diagnosis not present

## 2021-08-09 DIAGNOSIS — R69 Illness, unspecified: Secondary | ICD-10-CM | POA: Diagnosis not present

## 2021-08-09 DIAGNOSIS — F17219 Nicotine dependence, cigarettes, with unspecified nicotine-induced disorders: Secondary | ICD-10-CM

## 2021-08-09 MED ORDER — SERTRALINE HCL 50 MG PO TABS: 50.0000 mg | ORAL_TABLET | Freq: Every day | ORAL | 1 refills | Status: AC

## 2021-08-09 NOTE — Assessment & Plan Note (Signed)
Cmp drawn at visit today. Will make recommendations based on lab results.

## 2021-08-09 NOTE — Assessment & Plan Note (Addendum)
CBC drawn today to evaluate anemia.  Continue to follow up with Nephrology.

## 2021-08-09 NOTE — Assessment & Plan Note (Signed)
Patient is taking the Ambien for the first 15 days of the month which is what his insurance will cover.  Then he uses 15 Trazodone for the rest of the month.  Patient assured me that he does not take them together.  Advised him why he can't take them together and patient understands and agrees not to.  Will continue both medications for patient as sleep aid. He understands the risk of long term Hypnotic use.

## 2021-08-09 NOTE — Progress Notes (Signed)
BP 140/77 (BP Location: Left Arm, Cuff Size: Normal)   Pulse 80   Temp 97.7 F (36.5 C) (Oral)   Ht 5' 6" (1.676 m)   Wt 144 lb 9.6 oz (65.6 kg)   SpO2 99%   BMI 23.34 kg/m    Subjective:    Patient ID: Larry Larry [name], Larry [name]    DOB: [birth year], 55 y.o.   MRN: [id number]  HPI: Larry Larry [name] is a 55 y.o. Larry [name]  Chief Complaint  Patient presents with   Hospitalization Follow-up    Pt states he has been in the hospital for pneumonia and fluid on his lungs   Insomnia   Transition of [facility] Follow up.   Hospital/Facility: [facility] D/C Physician:  D/C Date: 07/30/2021  Records Requested: Reviewed in Epic Records Received: Yes Records Reviewed: Yes  Diagnoses on Discharge:   Presented to [facility] with acute hypoxemic respiratory failure 2/2 flash pulmonary edema and hyperkalemia. Patient's hyperkalemia and flash pulmonary edema resolved with peritoneal dialysis and dry weight was dropped from 66.4 kg to 63.6 kg on discharge.  Acute hypoxemic respiratory failure  HFrEF (20-25%): Pt had SBP>200s in ED. Likely that pulmonary edema 2/2 high blood pressure in the setting of his weak heart and ESRD. Volume was removed during peritoneal dialysis and patient clinically improved. Chest x-ray repeated on 11/5 shows resolved pulmonary edema compared to 11/4 XR in ED. Cardiology consulted and does not recommend interventions for ischemia at this time. UA negative for leuk esterases and nitrates nonconcerning for infection. Discharge dry weight was 63.6kg down from 66.4kg on admission. Patient will follow up with cardiology on 11/9.  ESRD on peritoneal dialysis: Patient receives nightly peritoneal dialysis while hospitalized. Elevated potassium would suggest noncompliance, however patient denies noncompliance. Dry weight on discharge was 63.6 kg. Alterations to dialysis plan were communicated to dialysis center by Dr. Kerby Moors. - patient states he has been compliant and is feeling good.    Hypertension: BP improved after PD fluid removal, but still SBP up to 180s on discharge. He was discharged on amlodipine 10 mg daily, carvedilol 25 mg BID, and losartan 100 mg daily. - continue with current regimen.  Reminded patient that Carvedilol is to be taken BID.  Severe Hyperkalemia: K initially 7 in the ED. Patient given several rounds of potassium lowering/shifting medications and potassium was still elevated at 7.1. EKG had peaked T-waves. After peritoneal dialysis hyper kalemia has resolved and potassium was 4.3 on discharge. - labs ordered in office today.   Leukocytosis--persistent: Patient is nontoxic-appearing and afebrile. Low concern for infection. Urine culture and peritoneal dialysis fluid culture with no growth at 24 hours at time of discharge. - labs rdered in office today.    Denies HA, CP, SOB, dizziness, palpitations, visual changes, and lower extremity swelling.   Date of interactive Contact within 48 hours of discharge:  Contact was through: phone  Date of 7 day or 14 day face-to-face visit:    within 14 days  Outpatient Encounter Medications as of 08/09/2021  Medication Sig   acetaminophen (TYLENOL) 500 MG tablet Take 500-1,000 mg by mouth every 6 (six) hours as needed for mild pain or moderate pain.   albuterol (VENTOLIN HFA) 108 (90 Base) MCG/ACT inhaler Inhale 2 puffs into the lungs every 6 (six) hours as needed for wheezing or shortness of breath.   amLODipine (NORVASC) 10 MG tablet Take 1 tablet (10 mg total) by mouth daily.   aspirin 81 MG EC tablet Take 1 tablet by  mouth daily.   atorvastatin (LIPITOR) 80 MG tablet Take 1 tablet by mouth daily.   bumetanide (BUMEX) 2 MG tablet Take 1 tablet by mouth daily.   carvedilol (COREG) 25 MG tablet Take 25 mg by mouth 2 (two) times daily.   losartan (COZAAR) 100 MG tablet Take 100 mg by mouth daily.   montelukast (SINGULAIR) 10 MG tablet TAKE 1 TABLET BY MOUTH EVERYDAY AT BEDTIME   ondansetron (ZOFRAN) 4 MG  tablet Take 1 tablet (4 mg total) by mouth daily as needed for nausea or vomiting.   tamsulosin (FLOMAX) 0.4 MG CAPS capsule Take 1 tablet by mouth daily.   traZODone (DESYREL) 50 MG tablet TAKE 0.5-1 TABLETS BY MOUTH AT BEDTIME AS NEEDED FOR SLEEP.   [DISCONTINUED] sertraline (ZOLOFT) 50 MG tablet Take 50 mg by mouth daily.   sertraline (ZOLOFT) 50 MG tablet Take 1 tablet (50 mg total) by mouth daily.   [DISCONTINUED] pantoprazole (PROTONIX) 40 MG tablet Take 1 tablet (40 mg total) by mouth 2 (two) times daily. (Patient not taking: Reported on 08/01/2021)   [DISCONTINUED] predniSONE (DELTASONE) 10 MG tablet Take 1 tablet (10 mg total) by mouth daily with breakfast. Take 6 tabs today, 5 tomorrow, 4 the next day, and decrease by 1 each day until course is complete.   Facility-Administered Encounter Medications as of 08/09/2021  Medication   ipratropium-albuterol (DUONEB) 0.5-2.5 (3) MG/3ML nebulizer solution 3 mL    Diagnostic Tests Reviewed/Disposition: Reviewed  Consults: Cardiology, Nephrology  Discharge Instructions: reviewed with patient  Disease/illness Education: Provided during visit  Home Health/Community Services Discussions/Referrals: NA  Establishment or re-establishment of referral orders for community resources: Has already seen Cardiology on 11/9.  Discussion with other health care providers: NA  Assessment and Support of treatment regimen adherence: Taking medication as prescribed. Was not taking Carvedilol BID but has since he saw Cardiology.  Appointments Coordinated with: NA  Education for self-management, independent living, and ADLs: independent  Relevant past medical, surgical, family and social history reviewed and updated as indicated. Interim medical history since our last visit reviewed. Allergies and medications reviewed and updated.  Review of Systems  Eyes:  Negative for visual disturbance.  Respiratory:  Negative for shortness of breath.    Cardiovascular:  Negative for chest pain and leg swelling.  Neurological:  Negative for light-headedness and headaches.   Per HPI unless specifically indicated above     Objective:    BP 140/77 (BP Location: Left Arm, Cuff Size: Normal)   Pulse 80   Temp 97.7 F (36.5 C) (Oral)   Ht 5' 6" (1.676 m)   Wt 144 lb 9.6 oz (65.6 kg)   SpO2 99%   BMI 23.34 kg/m   Wt Readings from Last 3 Encounters:  08/09/21 144 lb 9.6 oz (65.6 kg)  08/01/21 141 lb 3.2 oz (64 kg)  07/12/21 138 lb (62.6 kg)    Physical Exam Vitals and nursing note reviewed.  Constitutional:      General: He is not in acute distress.    Appearance: Normal appearance. He is not ill-appearing, toxic-appearing or diaphoretic.  HENT:     Head: Normocephalic.     Right Ear: External ear normal.     Left Ear: External ear normal.     Nose: Nose normal. No congestion or rhinorrhea.     Mouth/Throat:     Mouth: Mucous membranes are moist.  Eyes:     General:        Right eye: No discharge.  Left eye: No discharge.     Extraocular Movements: Extraocular movements intact.     Conjunctiva/sclera: Conjunctivae normal.     Pupils: Pupils are equal, round, and reactive to light.  Cardiovascular:     Rate and Rhythm: Normal rate and regular rhythm.     Heart sounds: No murmur heard. Pulmonary:     Effort: Pulmonary effort is normal. No respiratory distress.     Breath sounds: Normal breath sounds. No wheezing, rhonchi or rales.  Abdominal:     General: Abdomen is flat. Bowel sounds are normal.  Musculoskeletal:     Cervical back: Normal range of motion and neck supple.  Skin:    General: Skin is warm and dry.     Capillary Refill: Capillary refill takes less than 2 seconds.  Neurological:     General: No focal deficit present.     Mental Status: He is alert and oriented to person, place, and time.  Psychiatric:        Mood and Affect: Mood normal.        Behavior: Behavior normal.        Thought Content:  Thought content normal.        Judgment: Judgment normal.    Results for orders placed or performed in visit on 07/09/21  CBC w/Diff  Result Value Ref Range   WBC 10.3 3.4 - 10.8 x10E3/uL   RBC 3.10 (L) 4.14 - 5.80 x10E6/uL   Hemoglobin 8.7 (L) 13.0 - 17.7 g/dL   Hematocrit 26.4 (L) 37.5 - 51.0 %   MCV 85 79 - 97 fL   MCH 28.1 26.6 - 33.0 pg   MCHC 33.0 31.5 - 35.7 g/dL   RDW 14.4 11.6 - 15.4 %   Platelets 256 150 - 450 x10E3/uL   Neutrophils 66 Not Estab. %   Lymphs 14 Not Estab. %   Monocytes 8 Not Estab. %   Eos 10 Not Estab. %   Basos 1 Not Estab. %   Neutrophils Absolute 6.9 1.4 - 7.0 x10E3/uL   Lymphocytes Absolute 1.4 0.7 - 3.1 x10E3/uL   Monocytes Absolute 0.8 0.1 - 0.9 x10E3/uL   EOS (ABSOLUTE) 1.1 (H) 0.0 - 0.4 x10E3/uL   Basophils Absolute 0.1 0.0 - 0.2 x10E3/uL   Immature Granulocytes 1 Not Estab. %   Immature Grans (Abs) 0.1 0.0 - 0.1 x10E3/uL      Assessment & Plan:   Problem List Items Addressed This Visit       Cardiovascular and Mediastinum   HTN (hypertension)    Chronic. Improved at visit today. Patient saw Cardiology on 07/31/21.  He is now taking the Carvedilol BID versus daily.  Continue to follow up with Cardiology and Nephrology.  Follow up with me in 1 month.      Relevant Medications   aspirin 81 MG EC tablet   atorvastatin (LIPITOR) 80 MG tablet   bumetanide (BUMEX) 2 MG tablet   HFrEF (heart failure with reduced ejection fraction) ([place])    Reviewed imaging from recent hospitalization.  Patient had normal EF in march of 2022.  EF during recent hospitalization was 20-25%.  Patient needs to continue to follow up with cardiology.  Does have upcoming Nuclear stress test.       Relevant Medications   aspirin 81 MG EC tablet   atorvastatin (LIPITOR) 80 MG tablet   bumetanide (BUMEX) 2 MG tablet     Genitourinary   Anemia in ESRD (end-stage renal disease) ([facility])    CBC  drawn today to evaluate anemia.  Continue to follow up with Nephrology.       Relevant Orders   CBC w/Diff     Other   ESRD on peritoneal dialysis [facility])    Patient states he is compliant. Getting between 1500 and 1846m per night with PD. CMP rechecked today to assess potassium level.  Will make recommendations based on lab results.       Relevant Orders   Comp Met (CMET)   Magnesium   Nicotine dependence    Patient has gone two weeks without smoking. Plans to continue smoking cessation.  Praised patient today for smoking cessation.       Hyperkalemia    Cmp drawn at visit today. Will make recommendations based on lab results.       Insomnia    Patient is taking the Ambien for the first 15 days of the month which is what his insurance will cover.  Then he uses 15 Trazodone for the rest of the month.  Patient assured me that he does not take them together.  Advised him why he can't take them together and patient understands and agrees not to.  Will continue both medications for patient as sleep aid. He understands the risk of long term Hypnotic use.       Other Visit Diagnoses     Hospital discharge follow-up    -  Primary   [facility] and [facility]. Continue with PD nightly. Continue to follow up with Cardiology and Nephrology.   Relevant Orders   Comp Met (CMET)   Acute cough            Follow up plan: Return in about 1 month (around 09/08/2021) for ESRD and SOB.

## 2021-08-09 NOTE — Assessment & Plan Note (Signed)
Patient has gone two weeks without smoking. Plans to continue smoking cessation.  Praised patient today for smoking cessation.

## 2021-08-09 NOTE — Assessment & Plan Note (Signed)
Reviewed imaging from recent hospitalization.  Patient had normal EF in march of 2022.  EF during recent hospitalization was 20-25%.  Patient needs to continue to follow up with cardiology.  Does have upcoming Nuclear stress test.

## 2021-08-09 NOTE — Assessment & Plan Note (Signed)
Patient states he is compliant. Getting between 1500 and 18104ml per night with PD. CMP rechecked today to assess potassium level.  Will make recommendations based on lab results.

## 2021-08-09 NOTE — Assessment & Plan Note (Signed)
Chronic. Improved at visit today. Patient saw Cardiology on 07/31/21.  He is now taking the Carvedilol BID versus daily.  Continue to follow up with Cardiology and Nephrology.  Follow up with me in 1 month.

## 2021-08-10 LAB — CBC WITH DIFFERENTIAL/PLATELET
Basophils Absolute: 0.1 x10E3/uL (ref 0.0–0.2)
Basos: 1 %
EOS (ABSOLUTE): 1.3 x10E3/uL — ABNORMAL HIGH (ref 0.0–0.4)
Eos: 14 %
Hematocrit: 25.3 % — ABNORMAL LOW (ref 37.5–51.0)
Hemoglobin: 8.4 g/dL — ABNORMAL LOW (ref 13.0–17.7)
Immature Grans (Abs): 0.1 x10E3/uL (ref 0.0–0.1)
Immature Granulocytes: 1 %
Lymphocytes Absolute: 2 x10E3/uL (ref 0.7–3.1)
Lymphs: 22 %
MCH: 28.7 pg (ref 26.6–33.0)
MCHC: 33.2 g/dL (ref 31.5–35.7)
MCV: 86 fL (ref 79–97)
Monocytes Absolute: 0.8 x10E3/uL (ref 0.1–0.9)
Monocytes: 9 %
Neutrophils Absolute: 5 x10E3/uL (ref 1.4–7.0)
Neutrophils: 53 %
Platelets: 342 x10E3/uL (ref 150–450)
RBC: 2.93 x10E6/uL — ABNORMAL LOW (ref 4.14–5.80)
RDW: 16 % — ABNORMAL HIGH (ref 11.6–15.4)
WBC: 9.3 x10E3/uL (ref 3.4–10.8)

## 2021-08-10 LAB — COMPREHENSIVE METABOLIC PANEL
ALT: 48 IU/L — ABNORMAL HIGH (ref 0–44)
AST: 35 IU/L (ref 0–40)
Albumin/Globulin Ratio: 1.7 (ref 1.2–2.2)
Albumin: 3.6 g/dL — ABNORMAL LOW (ref 3.8–4.9)
Alkaline Phosphatase: 103 IU/L (ref 44–121)
BUN/Creatinine Ratio: 5 — ABNORMAL LOW (ref 9–20)
BUN: 83 mg/dL (ref 6–24)
Bilirubin Total: 0.2 mg/dL (ref 0.0–1.2)
CO2: 17 mmol/L — ABNORMAL LOW (ref 20–29)
Calcium: 8.2 mg/dL — ABNORMAL LOW (ref 8.7–10.2)
Chloride: 97 mmol/L (ref 96–106)
Creatinine, Ser: 15.83 mg/dL (ref 0.76–1.27)
Globulin, Total: 2.1 g/dL (ref 1.5–4.5)
Glucose: 139 mg/dL — ABNORMAL HIGH (ref 70–99)
Potassium: 5.6 mmol/L — ABNORMAL HIGH (ref 3.5–5.2)
Sodium: 143 mmol/L (ref 134–144)
Total Protein: 5.7 g/dL — ABNORMAL LOW (ref 6.0–8.5)
eGFR: 3 mL/min/{1.73_m2} — ABNORMAL LOW (ref >59)

## 2021-08-10 LAB — MAGNESIUM: Magnesium: 2.1 mg/dL (ref 1.6–2.3)

## 2021-08-12 NOTE — Addendum Note (Signed)
Addended by: Jon Billings on: 08/12/2021 07:58 AM   Modules accepted: Orders

## 2021-08-12 NOTE — Progress Notes (Signed)
Please let patient know that his creatinine has worsened since he was in the hospital and his potassium is elevated.  He needs to come back in today to have his labs repeated.  Please also find out when he will see nephrology again.

## 2021-08-21 DIAGNOSIS — I5020 Unspecified systolic (congestive) heart failure: Secondary | ICD-10-CM | POA: Diagnosis not present

## 2021-08-21 DIAGNOSIS — I429 Cardiomyopathy, unspecified: Secondary | ICD-10-CM | POA: Diagnosis not present

## 2021-08-21 DIAGNOSIS — I2584 Coronary atherosclerosis due to calcified coronary lesion: Secondary | ICD-10-CM | POA: Diagnosis not present

## 2021-08-21 DIAGNOSIS — I2510 Atherosclerotic heart disease of native coronary artery without angina pectoris: Secondary | ICD-10-CM | POA: Diagnosis not present

## 2021-08-26 DIAGNOSIS — I5032 Chronic diastolic (congestive) heart failure: Secondary | ICD-10-CM | POA: Diagnosis not present

## 2021-08-26 DIAGNOSIS — N329 Bladder disorder, unspecified: Secondary | ICD-10-CM | POA: Diagnosis not present

## 2021-08-26 DIAGNOSIS — N186 End stage renal disease: Secondary | ICD-10-CM | POA: Diagnosis not present

## 2021-08-26 DIAGNOSIS — E785 Hyperlipidemia, unspecified: Secondary | ICD-10-CM | POA: Diagnosis not present

## 2021-08-26 DIAGNOSIS — D494 Neoplasm of unspecified behavior of bladder: Secondary | ICD-10-CM | POA: Diagnosis not present

## 2021-08-26 DIAGNOSIS — I132 Hypertensive heart and chronic kidney disease with heart failure and with stage 5 chronic kidney disease, or end stage renal disease: Secondary | ICD-10-CM | POA: Diagnosis not present

## 2021-08-26 DIAGNOSIS — N3289 Other specified disorders of bladder: Secondary | ICD-10-CM | POA: Diagnosis not present

## 2021-09-04 ENCOUNTER — Encounter: Payer: Self-pay | Admitting: Nurse Practitioner

## 2021-09-04 ENCOUNTER — Ambulatory Visit (INDEPENDENT_AMBULATORY_CARE_PROVIDER_SITE_OTHER): Payer: Medicare Other | Admitting: Nurse Practitioner

## 2021-09-04 ENCOUNTER — Other Ambulatory Visit: Payer: Self-pay

## 2021-09-04 VITALS — BP 166/84 | HR 74 | Temp 98.9°F | Wt 144.6 lb

## 2021-09-04 DIAGNOSIS — H6502 Acute serous otitis media, left ear: Secondary | ICD-10-CM

## 2021-09-04 MED ORDER — DOXYCYCLINE HYCLATE 100 MG PO TABS: 100.0000 mg | ORAL_TABLET | Freq: Two times a day (BID) | ORAL | 0 refills | Status: AC

## 2021-09-04 NOTE — Progress Notes (Signed)
BP (!) 166/84 (BP Location: Left Arm, Cuff Size: Normal)    Pulse 74    Temp 98.9 F (37.2 C) (Oral)    Wt 144 lb 9.6 oz (65.6 kg)    SpO2 100%    BMI 23.34 kg/m    Subjective:    Patient ID: Larry [name], male    DOB: 1966/03/20, 55 y.o.   MRN: [id number]  HPI: Larry [name] is a 55 y.o. male  Chief Complaint  Patient presents with   Ear Pain    Pt states he has been having a aching feeling in his left ear since yesterday.    Nausea    Pt states he has been feeling nauseous since yesterday. States he did not eat anything yesterday before he vomited. States he was able to eat some today.     Patient states he is having some nausea since yesterday.  He has had some vomiting. Not able to each much since yesterday.  He is having a little bit of dizziness, congestion and left ear pain. Denies fever, SOB.  Does have a regular cough due to many years of smoking.   Has not smoked in over a month.  Relevant past medical, surgical, family and social history reviewed and updated as indicated. Interim medical history since our last visit reviewed. Allergies and medications reviewed and updated.  Review of Systems  Constitutional:  Negative for fever.  HENT:  Positive for congestion and ear pain.   Respiratory:  Negative for shortness of breath.   Neurological:  Positive for dizziness.   Per HPI unless specifically indicated above     Objective:    BP (!) 166/84 (BP Location: Left Arm, Cuff Size: Normal)    Pulse 74    Temp 98.9 F (37.2 C) (Oral)    Wt 144 lb 9.6 oz (65.6 kg)    SpO2 100%    BMI 23.34 kg/m   Wt Readings from Last 3 Encounters:  09/04/21 144 lb 9.6 oz (65.6 kg)  08/09/21 144 lb 9.6 oz (65.6 kg)  08/01/21 141 lb 3.2 oz (64 kg)    Physical Exam Vitals and nursing note reviewed.  Constitutional:      General: He is not in acute distress.    Appearance: Normal appearance. He is not ill-appearing, toxic-appearing or diaphoretic.  HENT:     Head: Normocephalic.      Right Ear: Tympanic membrane and external ear normal.     Left Ear: External ear normal. Tympanic membrane is erythematous.     Nose: Nose normal. No congestion or rhinorrhea.     Mouth/Throat:     Mouth: Mucous membranes are moist.  Eyes:     General:        Right eye: No discharge.        Left eye: No discharge.     Extraocular Movements: Extraocular movements intact.     Conjunctiva/sclera: Conjunctivae normal.     Pupils: Pupils are equal, round, and reactive to light.  Cardiovascular:     Rate and Rhythm: Normal rate and regular rhythm.     Heart sounds: No murmur heard. Pulmonary:     Effort: Pulmonary effort is normal. No respiratory distress.     Breath sounds: Normal breath sounds. No wheezing, rhonchi or rales.  Abdominal:     General: Abdomen is flat. Bowel sounds are normal.  Musculoskeletal:     Cervical back: Normal range of motion and neck supple.  Skin:  General: Skin is warm and dry.     Capillary Refill: Capillary refill takes less than 2 seconds.  Neurological:     General: No focal deficit present.     Mental Status: He is alert and oriented to person, place, and time.  Psychiatric:        Mood and Affect: Mood normal.        Behavior: Behavior normal.        Thought Content: Thought content normal.        Judgment: Judgment normal.    Results for orders placed or performed in visit on 08/09/21  Comp Met (CMET)  Result Value Ref Range   Glucose 139 (H) 70 - 99 mg/dL   BUN 83 (HH) 6 - 24 mg/dL   Creatinine, Ser 15.83 (HH) 0.76 - 1.27 mg/dL   eGFR 3 (L) >59 mL/min/1.73   BUN/Creatinine Ratio 5 (L) 9 - 20   Sodium 143 134 - 144 mmol/L   Potassium 5.6 (H) 3.5 - 5.2 mmol/L   Chloride 97 96 - 106 mmol/L   CO2 17 (L) 20 - 29 mmol/L   Calcium 8.2 (L) 8.7 - 10.2 mg/dL   Total Protein 5.7 (L) 6.0 - 8.5 g/dL   Albumin 3.6 (L) 3.8 - 4.9 g/dL   Globulin, Total 2.1 1.5 - 4.5 g/dL   Albumin/Globulin Ratio 1.7 1.2 - 2.2   Bilirubin Total 0.2 0.0 - 1.2  mg/dL   Alkaline Phosphatase 103 44 - 121 IU/L   AST 35 0 - 40 IU/L   ALT 48 (H) 0 - 44 IU/L  CBC w/Diff  Result Value Ref Range   WBC 9.3 3.4 - 10.8 x10E3/uL   RBC 2.93 (L) 4.14 - 5.80 x10E6/uL   Hemoglobin 8.4 (L) 13.0 - 17.7 g/dL   Hematocrit 25.3 (L) 37.5 - 51.0 %   MCV 86 79 - 97 fL   MCH 28.7 26.6 - 33.0 pg   MCHC 33.2 31.5 - 35.7 g/dL   RDW 16.0 (H) 11.6 - 15.4 %   Platelets 342 150 - 450 x10E3/uL   Neutrophils 53 Not Estab. %   Lymphs 22 Not Estab. %   Monocytes 9 Not Estab. %   Eos 14 Not Estab. %   Basos 1 Not Estab. %   Neutrophils Absolute 5.0 1.4 - 7.0 x10E3/uL   Lymphocytes Absolute 2.0 0.7 - 3.1 x10E3/uL   Monocytes Absolute 0.8 0.1 - 0.9 x10E3/uL   EOS (ABSOLUTE) 1.3 (H) 0.0 - 0.4 x10E3/uL   Basophils Absolute 0.1 0.0 - 0.2 x10E3/uL   Immature Granulocytes 1 Not Estab. %   Immature Grans (Abs) 0.1 0.0 - 0.1 x10E3/uL  Magnesium  Result Value Ref Range   Magnesium 2.1 1.6 - 2.3 mg/dL      Assessment & Plan:   Problem List Items Addressed This Visit   None Visit Diagnoses     Non-recurrent acute serous otitis media of left ear    -  Primary   Will send Doxycyline 143m to the pharmacy. Compelte course of medications. Follow up if symptoms worsen or fail to improve.    Relevant Medications   doxycycline (VIBRA-TABS) 100 MG tablet        Follow up plan: Return if symptoms worsen or fail to improve.

## 2021-09-09 ENCOUNTER — Ambulatory Visit: Payer: Medicare Other | Admitting: Nurse Practitioner

## 2021-09-09 NOTE — Progress Notes (Deleted)
There were no vitals taken for this visit.   Subjective:    Patient ID: Larry [name], male    DOB: Sep 01, 1966, 55 y.o.   MRN: [id number]  HPI: Larry [name] is a 55 y.o. male  No chief complaint on file.  COPD COPD status: {Blank single:19197::"controlled","uncontrolled","better","worse","exacerbated","stable"} Satisfied with current treatment?: {Blank single:19197::"yes","no"} Oxygen use: {Blank single:19197::"yes","no"} Dyspnea frequency:  Cough frequency:  Rescue inhaler frequency:   Limitation of activity: {Blank single:19197::"yes","no"} Productive cough:  Last Spirometry:  Pneumovax: {Blank single:19197::"Up to Date","Not up to Date","unknown"} Influenza: {Blank single:19197::"Up to Date","Not up to Date","unknown"}  CHRONIC KIDNEY DISEASE CKD status: {Blank single:19197::"controlled","uncontrolled","better","worse","exacerbated","stable"} Medications renally dose: {Blank single:19197::"yes","no"} Previous renal evaluation: {Blank single:19197::"yes","no"} Pneumovax:  {Blank single:19197::"Up to Date","Not up to Date","unknown"} Influenza Vaccine:  {Blank single:19197::"Up to Date","Not up to Date","unknown"}  Relevant past medical, surgical, family and social history reviewed and updated as indicated. Interim medical history since our last visit reviewed. Allergies and medications reviewed and updated.  Review of Systems  Per HPI unless specifically indicated above     Objective:    There were no vitals taken for this visit.  Wt Readings from Last 3 Encounters:  09/04/21 144 lb 9.6 oz (65.6 kg)  08/09/21 144 lb 9.6 oz (65.6 kg)  08/01/21 141 lb 3.2 oz (64 kg)    Physical Exam  Results for orders placed or performed in visit on 08/09/21  Comp Met (CMET)  Result Value Ref Range   Glucose 139 (H) 70 - 99 mg/dL   BUN 83 (HH) 6 - 24 mg/dL   Creatinine, Ser 15.83 (HH) 0.76 - 1.27 mg/dL   eGFR 3 (L) >59 mL/min/1.73   BUN/Creatinine Ratio 5 (L) 9 - 20    Sodium 143 134 - 144 mmol/L   Potassium 5.6 (H) 3.5 - 5.2 mmol/L   Chloride 97 96 - 106 mmol/L   CO2 17 (L) 20 - 29 mmol/L   Calcium 8.2 (L) 8.7 - 10.2 mg/dL   Total Protein 5.7 (L) 6.0 - 8.5 g/dL   Albumin 3.6 (L) 3.8 - 4.9 g/dL   Globulin, Total 2.1 1.5 - 4.5 g/dL   Albumin/Globulin Ratio 1.7 1.2 - 2.2   Bilirubin Total 0.2 0.0 - 1.2 mg/dL   Alkaline Phosphatase 103 44 - 121 IU/L   AST 35 0 - 40 IU/L   ALT 48 (H) 0 - 44 IU/L  CBC w/Diff  Result Value Ref Range   WBC 9.3 3.4 - 10.8 x10E3/uL   RBC 2.93 (L) 4.14 - 5.80 x10E6/uL   Hemoglobin 8.4 (L) 13.0 - 17.7 g/dL   Hematocrit 25.3 (L) 37.5 - 51.0 %   MCV 86 79 - 97 fL   MCH 28.7 26.6 - 33.0 pg   MCHC 33.2 31.5 - 35.7 g/dL   RDW 16.0 (H) 11.6 - 15.4 %   Platelets 342 150 - 450 x10E3/uL   Neutrophils 53 Not Estab. %   Lymphs 22 Not Estab. %   Monocytes 9 Not Estab. %   Eos 14 Not Estab. %   Basos 1 Not Estab. %   Neutrophils Absolute 5.0 1.4 - 7.0 x10E3/uL   Lymphocytes Absolute 2.0 0.7 - 3.1 x10E3/uL   Monocytes Absolute 0.8 0.1 - 0.9 x10E3/uL   EOS (ABSOLUTE) 1.3 (H) 0.0 - 0.4 x10E3/uL   Basophils Absolute 0.1 0.0 - 0.2 x10E3/uL   Immature Granulocytes 1 Not Estab. %   Immature Grans (Abs) 0.1 0.0 - 0.1 x10E3/uL  Magnesium  Result Value Ref Range  Magnesium 2.1 1.6 - 2.3 mg/dL      Assessment & Plan:   Problem List Items Addressed This Visit       Cardiovascular and Mediastinum   Uncontrolled hypertension - Primary   HFrEF (heart failure with reduced ejection fraction) (HCC)     Respiratory   Acute hypoxemic respiratory failure (HCC)     Genitourinary   Anemia in ESRD (end-stage renal disease) ([place])     Other   Anemia of chronic kidney failure, stage 5 ([place])   ESRD on peritoneal dialysis ([place])     Follow up plan: No follow-ups on file.

## 2021-09-11 ENCOUNTER — Other Ambulatory Visit: Payer: Self-pay | Admitting: Nurse Practitioner

## 2021-09-18 DIAGNOSIS — R0602 Shortness of breath: Secondary | ICD-10-CM | POA: Diagnosis not present

## 2021-09-18 DIAGNOSIS — J90 Pleural effusion, not elsewhere classified: Secondary | ICD-10-CM | POA: Diagnosis not present

## 2021-09-18 DIAGNOSIS — I132 Hypertensive heart and chronic kidney disease with heart failure and with stage 5 chronic kidney disease, or end stage renal disease: Secondary | ICD-10-CM | POA: Diagnosis not present

## 2021-09-18 DIAGNOSIS — R0600 Dyspnea, unspecified: Secondary | ICD-10-CM | POA: Diagnosis not present

## 2021-09-18 DIAGNOSIS — R059 Cough, unspecified: Secondary | ICD-10-CM | POA: Diagnosis not present

## 2021-09-18 DIAGNOSIS — N186 End stage renal disease: Secondary | ICD-10-CM | POA: Diagnosis not present

## 2021-09-18 DIAGNOSIS — J9601 Acute respiratory failure with hypoxia: Secondary | ICD-10-CM | POA: Diagnosis not present

## 2021-09-18 DIAGNOSIS — R918 Other nonspecific abnormal finding of lung field: Secondary | ICD-10-CM | POA: Diagnosis not present

## 2021-09-18 DIAGNOSIS — Z992 Dependence on renal dialysis: Secondary | ICD-10-CM | POA: Diagnosis not present

## 2021-09-18 DIAGNOSIS — I5020 Unspecified systolic (congestive) heart failure: Secondary | ICD-10-CM | POA: Diagnosis not present

## 2021-09-19 DIAGNOSIS — I5020 Unspecified systolic (congestive) heart failure: Secondary | ICD-10-CM | POA: Diagnosis not present

## 2021-09-19 DIAGNOSIS — Z992 Dependence on renal dialysis: Secondary | ICD-10-CM | POA: Diagnosis not present

## 2021-09-19 DIAGNOSIS — D631 Anemia in chronic kidney disease: Secondary | ICD-10-CM | POA: Diagnosis not present

## 2021-09-19 DIAGNOSIS — R0609 Other forms of dyspnea: Secondary | ICD-10-CM | POA: Diagnosis not present

## 2021-09-19 DIAGNOSIS — Z6822 Body mass index (BMI) 22.0-22.9, adult: Secondary | ICD-10-CM | POA: Diagnosis not present

## 2021-09-19 DIAGNOSIS — R778 Other specified abnormalities of plasma proteins: Secondary | ICD-10-CM | POA: Diagnosis not present

## 2021-09-19 DIAGNOSIS — I110 Hypertensive heart disease with heart failure: Secondary | ICD-10-CM | POA: Diagnosis not present

## 2021-09-19 DIAGNOSIS — E8770 Fluid overload, unspecified: Secondary | ICD-10-CM | POA: Diagnosis not present

## 2021-09-19 DIAGNOSIS — N186 End stage renal disease: Secondary | ICD-10-CM | POA: Diagnosis not present

## 2021-09-20 DIAGNOSIS — D631 Anemia in chronic kidney disease: Secondary | ICD-10-CM | POA: Diagnosis not present

## 2021-09-20 DIAGNOSIS — Z992 Dependence on renal dialysis: Secondary | ICD-10-CM | POA: Diagnosis not present

## 2021-09-20 DIAGNOSIS — E8729 Other acidosis: Secondary | ICD-10-CM | POA: Diagnosis not present

## 2021-09-20 DIAGNOSIS — N186 End stage renal disease: Secondary | ICD-10-CM | POA: Diagnosis not present

## 2021-09-20 DIAGNOSIS — J189 Pneumonia, unspecified organism: Secondary | ICD-10-CM | POA: Diagnosis not present

## 2021-09-21 DIAGNOSIS — Z992 Dependence on renal dialysis: Secondary | ICD-10-CM | POA: Diagnosis not present

## 2021-09-21 DIAGNOSIS — N186 End stage renal disease: Secondary | ICD-10-CM | POA: Diagnosis not present

## 2021-09-22 DIAGNOSIS — R0600 Dyspnea, unspecified: Secondary | ICD-10-CM | POA: Diagnosis not present

## 2021-09-26 ENCOUNTER — Inpatient Hospital Stay: Payer: Medicare Other | Admitting: Nurse Practitioner

## 2021-09-26 NOTE — Progress Notes (Deleted)
There were no vitals taken for this visit.   Subjective:    Patient ID: Larry [name], male    DOB: [birth year], 56 y.o.   MRN: [id number]  HPI: Larry [name] is a 56 y.o. male  No chief complaint on file.  Acute hypoxemic respiratory failure (CMS-HCC) POA: Yes Active Problems: Anemia due to chronic kidney disease POA: Yes CAP (community acquired pneumonia) POA: Yes Troponin level elevated POA: Yes Hyperphosphatemia POA: Yes ESRD on peritoneal dialysis (CMS-HCC) POA: Not Applicable Normocytic anemia POA: Yes Heart failure (CMS-HCC) POA: Unknown Resolved Problems: Volume overload POA: Yes Metabolic acidosis, normal anion gap (NAG) POA: Unknown Relevant past medical, surgical, family and social history reviewed and updated as indicated. Interim medical history since our last visit reviewed. Allergies and medications reviewed and updated.  Review of Systems  Per HPI unless specifically indicated above     Objective:    There were no vitals taken for this visit.  Wt Readings from Last 3 Encounters:  09/04/21 144 lb 9.6 oz (65.6 kg)  08/09/21 144 lb 9.6 oz (65.6 kg)  08/01/21 141 lb 3.2 oz (64 kg)    Physical Exam  Results for orders placed or performed in visit on 08/09/21  Comp Met (CMET)  Result Value Ref Range   Glucose 139 (H) 70 - 99 mg/dL   BUN 83 (HH) 6 - 24 mg/dL   Creatinine, Ser 15.83 (HH) 0.76 - 1.27 mg/dL   eGFR 3 (L) >59 mL/min/1.73   BUN/Creatinine Ratio 5 (L) 9 - 20   Sodium 143 134 - 144 mmol/L   Potassium 5.6 (H) 3.5 - 5.2 mmol/L   Chloride 97 96 - 106 mmol/L   CO2 17 (L) 20 - 29 mmol/L   Calcium 8.2 (L) 8.7 - 10.2 mg/dL   Total Protein 5.7 (L) 6.0 - 8.5 g/dL   Albumin 3.6 (L) 3.8 - 4.9 g/dL   Globulin, Total 2.1 1.5 - 4.5 g/dL   Albumin/Globulin Ratio 1.7 1.2 - 2.2   Bilirubin Total 0.2 0.0 - 1.2 mg/dL   Alkaline Phosphatase 103 44 - 121 IU/L   AST 35 0 - 40 IU/L   ALT 48 (H) 0 - 44 IU/L  CBC w/Diff  Result Value Ref Range   WBC 9.3  3.4 - 10.8 x10E3/uL   RBC 2.93 (L) 4.14 - 5.80 x10E6/uL   Hemoglobin 8.4 (L) 13.0 - 17.7 g/dL   Hematocrit 25.3 (L) 37.5 - 51.0 %   MCV 86 79 - 97 fL   MCH 28.7 26.6 - 33.0 pg   MCHC 33.2 31.5 - 35.7 g/dL   RDW 16.0 (H) 11.6 - 15.4 %   Platelets 342 150 - 450 x10E3/uL   Neutrophils 53 Not Estab. %   Lymphs 22 Not Estab. %   Monocytes 9 Not Estab. %   Eos 14 Not Estab. %   Basos 1 Not Estab. %   Neutrophils Absolute 5.0 1.4 - 7.0 x10E3/uL   Lymphocytes Absolute 2.0 0.7 - 3.1 x10E3/uL   Monocytes Absolute 0.8 0.1 - 0.9 x10E3/uL   EOS (ABSOLUTE) 1.3 (H) 0.0 - 0.4 x10E3/uL   Basophils Absolute 0.1 0.0 - 0.2 x10E3/uL   Immature Granulocytes 1 Not Estab. %   Immature Grans (Abs) 0.1 0.0 - 0.1 x10E3/uL  Magnesium  Result Value Ref Range   Magnesium 2.1 1.6 - 2.3 mg/dL      Assessment & Plan:   Problem List Items Addressed This Visit   None  Follow up plan: No follow-ups on file.

## 2021-09-30 ENCOUNTER — Other Ambulatory Visit: Payer: Self-pay | Admitting: Nurse Practitioner

## 2021-09-30 DIAGNOSIS — I4581 Long QT syndrome: Secondary | ICD-10-CM | POA: Diagnosis not present

## 2021-09-30 DIAGNOSIS — R0600 Dyspnea, unspecified: Secondary | ICD-10-CM | POA: Diagnosis not present

## 2021-09-30 DIAGNOSIS — R0602 Shortness of breath: Secondary | ICD-10-CM | POA: Diagnosis not present

## 2021-09-30 DIAGNOSIS — J90 Pleural effusion, not elsewhere classified: Secondary | ICD-10-CM | POA: Diagnosis not present

## 2021-09-30 DIAGNOSIS — I491 Atrial premature depolarization: Secondary | ICD-10-CM | POA: Diagnosis not present

## 2021-09-30 DIAGNOSIS — J811 Chronic pulmonary edema: Secondary | ICD-10-CM | POA: Diagnosis not present

## 2021-09-30 NOTE — Telephone Encounter (Signed)
Requested medication (s) are due for refill today: NO  Requested medication (s) are on the active medication list: NO, had a one time order for 3 ml duoneb.  Last refill 07/15/21  Future visit scheduled: 06/27/22  Notes to clinic:  not on current med list, had a one time order for duoneb. Please assess.  Requested Prescriptions  Pending Prescriptions Disp Refills   albuterol (PROVENTIL) (2.5 MG/3ML) 0.083% nebulizer solution [Pharmacy Med Name: ALBUTEROL SUL 2.5 MG/3 ML SOLN]  0    Sig: USE ONE VIAL IN NEBULIZER EVERY 6 HOURS AS NEEDED FOR WHEEZING OR SHORTNESS OF BREATH.     Pulmonology:  Beta Agonists Failed - 09/30/2021 12:12 PM      Failed - One inhaler should last at least one month. If the patient is requesting refills earlier, contact the patient to check for uncontrolled symptoms.      Passed - Valid encounter within last 12 months    Recent Outpatient Visits           3 weeks ago Non-recurrent acute serous otitis media of left ear   [place], NP   1 month ago Hospital discharge follow-up   [facility] Jon Billings, NP   2 months ago Shortness of breath   [place], Lauren A, NP   2 months ago Viral upper respiratory tract infection   [place], NP   3 months ago Diarrhea, unspecified type   [facility] Jon Billings, NP       Future Appointments             In 9 months [facility], [place]

## 2021-10-01 DIAGNOSIS — I5020 Unspecified systolic (congestive) heart failure: Secondary | ICD-10-CM | POA: Diagnosis not present

## 2021-10-01 DIAGNOSIS — E8770 Fluid overload, unspecified: Secondary | ICD-10-CM | POA: Diagnosis not present

## 2021-10-01 DIAGNOSIS — I132 Hypertensive heart and chronic kidney disease with heart failure and with stage 5 chronic kidney disease, or end stage renal disease: Secondary | ICD-10-CM | POA: Diagnosis not present

## 2021-10-01 DIAGNOSIS — D631 Anemia in chronic kidney disease: Secondary | ICD-10-CM | POA: Diagnosis not present

## 2021-10-01 DIAGNOSIS — Z992 Dependence on renal dialysis: Secondary | ICD-10-CM | POA: Diagnosis not present

## 2021-10-01 DIAGNOSIS — N186 End stage renal disease: Secondary | ICD-10-CM | POA: Diagnosis not present

## 2021-10-01 DIAGNOSIS — I120 Hypertensive chronic kidney disease with stage 5 chronic kidney disease or end stage renal disease: Secondary | ICD-10-CM | POA: Diagnosis not present

## 2021-10-02 DIAGNOSIS — N186 End stage renal disease: Secondary | ICD-10-CM | POA: Diagnosis not present

## 2021-10-02 DIAGNOSIS — J9601 Acute respiratory failure with hypoxia: Secondary | ICD-10-CM | POA: Diagnosis not present

## 2021-10-02 DIAGNOSIS — J811 Chronic pulmonary edema: Secondary | ICD-10-CM | POA: Diagnosis not present

## 2021-10-02 DIAGNOSIS — Z992 Dependence on renal dialysis: Secondary | ICD-10-CM | POA: Diagnosis not present

## 2021-10-02 DIAGNOSIS — I132 Hypertensive heart and chronic kidney disease with heart failure and with stage 5 chronic kidney disease, or end stage renal disease: Secondary | ICD-10-CM | POA: Diagnosis not present

## 2021-10-02 DIAGNOSIS — D631 Anemia in chronic kidney disease: Secondary | ICD-10-CM | POA: Diagnosis not present

## 2021-10-02 DIAGNOSIS — I5020 Unspecified systolic (congestive) heart failure: Secondary | ICD-10-CM | POA: Diagnosis not present

## 2021-10-02 DIAGNOSIS — E8770 Fluid overload, unspecified: Secondary | ICD-10-CM | POA: Diagnosis not present

## 2021-10-09 ENCOUNTER — Other Ambulatory Visit: Payer: Self-pay

## 2021-11-19 DIAGNOSIS — N186 End stage renal disease: Secondary | ICD-10-CM | POA: Diagnosis not present

## 2021-11-19 DIAGNOSIS — Z992 Dependence on renal dialysis: Secondary | ICD-10-CM | POA: Diagnosis not present

## 2021-11-26 ENCOUNTER — Other Ambulatory Visit: Payer: Self-pay | Admitting: Nurse Practitioner

## 2021-11-27 NOTE — Telephone Encounter (Signed)
Requested Prescriptions  ?Pending Prescriptions Disp Refills  ?? montelukast (SINGULAIR) 10 MG tablet [Pharmacy Med Name: MONTELUKAST SOD 10 MG TABLET] 90 tablet 0  ?  Sig: TAKE 1 TABLET BY MOUTH EVERYDAY AT BEDTIME  ?  ? Pulmonology:  Leukotriene Inhibitors Passed - 11/26/2021  3:58 PM  ?  ?  Passed - Valid encounter within last 12 months  ?  Recent Outpatient Visits   ?      ? 2 months ago Non-recurrent acute serous otitis media of left ear  ? [place], NP  ? 3 months ago Hospital discharge follow-up  ? [place], NP  ? 4 months ago Shortness of breath  ? [place], Lauren A, NP  ? 4 months ago Viral upper respiratory tract infection  ? [place], NP  ? 5 months ago Diarrhea, unspecified type  ? [facility] Jon Billings, NP  ?  ?  ?Future Appointments   ?        ? In 7 months [place], [facility]   ?  ? ?  ?  ?  ? ?

## 2021-11-28 ENCOUNTER — Emergency Department (HOSPITAL_COMMUNITY): Payer: Medicare Other

## 2021-11-28 ENCOUNTER — Inpatient Hospital Stay (HOSPITAL_COMMUNITY)
Admission: EM | Admit: 2021-11-28 | Discharge: 2021-12-21 | DRG: 064 | Disposition: E | Payer: Medicare Other | Attending: Internal Medicine | Admitting: Internal Medicine

## 2021-11-28 DIAGNOSIS — Z7982 Long term (current) use of aspirin: Secondary | ICD-10-CM

## 2021-11-28 DIAGNOSIS — I161 Hypertensive emergency: Secondary | ICD-10-CM | POA: Diagnosis present

## 2021-11-28 DIAGNOSIS — Z515 Encounter for palliative care: Secondary | ICD-10-CM | POA: Diagnosis not present

## 2021-11-28 DIAGNOSIS — I5022 Chronic systolic (congestive) heart failure: Secondary | ICD-10-CM | POA: Diagnosis present

## 2021-11-28 DIAGNOSIS — G935 Compression of brain: Secondary | ICD-10-CM | POA: Diagnosis present

## 2021-11-28 DIAGNOSIS — R4182 Altered mental status, unspecified: Secondary | ICD-10-CM

## 2021-11-28 DIAGNOSIS — I608 Other nontraumatic subarachnoid hemorrhage: Secondary | ICD-10-CM | POA: Diagnosis present

## 2021-11-28 DIAGNOSIS — E876 Hypokalemia: Secondary | ICD-10-CM | POA: Diagnosis present

## 2021-11-28 DIAGNOSIS — I132 Hypertensive heart and chronic kidney disease with heart failure and with stage 5 chronic kidney disease, or end stage renal disease: Secondary | ICD-10-CM | POA: Diagnosis present

## 2021-11-28 DIAGNOSIS — I629 Nontraumatic intracranial hemorrhage, unspecified: Secondary | ICD-10-CM | POA: Diagnosis present

## 2021-11-28 DIAGNOSIS — I499 Cardiac arrhythmia, unspecified: Secondary | ICD-10-CM | POA: Diagnosis present

## 2021-11-28 DIAGNOSIS — Z8249 Family history of ischemic heart disease and other diseases of the circulatory system: Secondary | ICD-10-CM

## 2021-11-28 DIAGNOSIS — Z992 Dependence on renal dialysis: Secondary | ICD-10-CM

## 2021-11-28 DIAGNOSIS — I616 Nontraumatic intracerebral hemorrhage, multiple localized: Secondary | ICD-10-CM | POA: Diagnosis present

## 2021-11-28 DIAGNOSIS — N186 End stage renal disease: Secondary | ICD-10-CM | POA: Diagnosis present

## 2021-11-28 DIAGNOSIS — Z9049 Acquired absence of other specified parts of digestive tract: Secondary | ICD-10-CM | POA: Diagnosis not present

## 2021-11-28 DIAGNOSIS — Z20822 Contact with and (suspected) exposure to covid-19: Secondary | ICD-10-CM | POA: Diagnosis present

## 2021-11-28 DIAGNOSIS — G8194 Hemiplegia, unspecified affecting left nondominant side: Secondary | ICD-10-CM | POA: Diagnosis present

## 2021-11-28 DIAGNOSIS — Z841 Family history of disorders of kidney and ureter: Secondary | ICD-10-CM

## 2021-11-28 DIAGNOSIS — Z888 Allergy status to other drugs, medicaments and biological substances status: Secondary | ICD-10-CM

## 2021-11-28 DIAGNOSIS — E878 Other disorders of electrolyte and fluid balance, not elsewhere classified: Secondary | ICD-10-CM | POA: Diagnosis present

## 2021-11-28 DIAGNOSIS — D72829 Elevated white blood cell count, unspecified: Secondary | ICD-10-CM | POA: Diagnosis present

## 2021-11-28 DIAGNOSIS — E871 Hypo-osmolality and hyponatremia: Secondary | ICD-10-CM | POA: Diagnosis present

## 2021-11-28 DIAGNOSIS — G936 Cerebral edema: Secondary | ICD-10-CM | POA: Diagnosis present

## 2021-11-28 DIAGNOSIS — I614 Nontraumatic intracerebral hemorrhage in cerebellum: Secondary | ICD-10-CM | POA: Diagnosis present

## 2021-11-28 DIAGNOSIS — G9349 Other encephalopathy: Secondary | ICD-10-CM | POA: Diagnosis present

## 2021-11-28 DIAGNOSIS — I480 Paroxysmal atrial fibrillation: Secondary | ICD-10-CM | POA: Diagnosis present

## 2021-11-28 DIAGNOSIS — D631 Anemia in chronic kidney disease: Secondary | ICD-10-CM | POA: Diagnosis present

## 2021-11-28 DIAGNOSIS — I615 Nontraumatic intracerebral hemorrhage, intraventricular: Principal | ICD-10-CM | POA: Diagnosis present

## 2021-11-28 DIAGNOSIS — I611 Nontraumatic intracerebral hemorrhage in hemisphere, cortical: Secondary | ICD-10-CM

## 2021-11-28 DIAGNOSIS — J9601 Acute respiratory failure with hypoxia: Secondary | ICD-10-CM | POA: Diagnosis present

## 2021-11-28 DIAGNOSIS — I619 Nontraumatic intracerebral hemorrhage, unspecified: Secondary | ICD-10-CM | POA: Diagnosis not present

## 2021-11-28 DIAGNOSIS — Z880 Allergy status to penicillin: Secondary | ICD-10-CM

## 2021-11-28 DIAGNOSIS — Z66 Do not resuscitate: Secondary | ICD-10-CM | POA: Diagnosis present

## 2021-11-28 DIAGNOSIS — N19 Unspecified kidney failure: Secondary | ICD-10-CM

## 2021-11-28 DIAGNOSIS — Z87441 Personal history of nephrotic syndrome: Secondary | ICD-10-CM

## 2021-11-28 DIAGNOSIS — R29727 NIHSS score 27: Secondary | ICD-10-CM | POA: Diagnosis present

## 2021-11-28 DIAGNOSIS — Z833 Family history of diabetes mellitus: Secondary | ICD-10-CM

## 2021-11-28 DIAGNOSIS — Z87891 Personal history of nicotine dependence: Secondary | ICD-10-CM | POA: Diagnosis not present

## 2021-11-28 DIAGNOSIS — Z79899 Other long term (current) drug therapy: Secondary | ICD-10-CM

## 2021-11-28 DIAGNOSIS — E8721 Acute metabolic acidosis: Secondary | ICD-10-CM | POA: Diagnosis present

## 2021-11-28 DIAGNOSIS — J9602 Acute respiratory failure with hypercapnia: Secondary | ICD-10-CM | POA: Diagnosis present

## 2021-11-28 LAB — RESP PANEL BY RT-PCR (FLU A&B, COVID) ARPGX2
Influenza A by PCR: NEGATIVE
Influenza B by PCR: NEGATIVE
SARS Coronavirus 2 by RT PCR: NEGATIVE

## 2021-11-28 LAB — I-STAT ARTERIAL BLOOD GAS, ED
Acid-base deficit: 9 mmol/L — ABNORMAL HIGH (ref 0.0–2.0)
Bicarbonate: 18.3 mmol/L — ABNORMAL LOW (ref 20.0–28.0)
Calcium, Ion: 0.91 mmol/L — ABNORMAL LOW (ref 1.15–1.40)
HCT: 29 % — ABNORMAL LOW (ref 39.0–52.0)
Hemoglobin: 9.9 g/dL — ABNORMAL LOW (ref 13.0–17.0)
O2 Saturation: 99 %
Potassium: 3.2 mmol/L — ABNORMAL LOW (ref 3.5–5.1)
Sodium: 133 mmol/L — ABNORMAL LOW (ref 135–145)
TCO2: 20 mmol/L — ABNORMAL LOW (ref 22–32)
pCO2 arterial: 46.3 mmHg (ref 32–48)
pH, Arterial: 7.206 — ABNORMAL LOW (ref 7.35–7.45)
pO2, Arterial: 163 mmHg — ABNORMAL HIGH (ref 83–108)

## 2021-11-28 LAB — DIFFERENTIAL
Abs Immature Granulocytes: 0.21 10*3/uL — ABNORMAL HIGH (ref 0.00–0.07)
Basophils Absolute: 0.1 10*3/uL (ref 0.0–0.1)
Basophils Relative: 1 %
Eosinophils Absolute: 0.7 10*3/uL — ABNORMAL HIGH (ref 0.0–0.5)
Eosinophils Relative: 5 %
Immature Granulocytes: 2 %
Lymphocytes Relative: 25 %
Lymphs Abs: 3.6 10*3/uL (ref 0.7–4.0)
Monocytes Absolute: 1.3 10*3/uL — ABNORMAL HIGH (ref 0.1–1.0)
Monocytes Relative: 9 %
Neutro Abs: 8.5 10*3/uL — ABNORMAL HIGH (ref 1.7–7.7)
Neutrophils Relative %: 58 %

## 2021-11-28 LAB — CBC
HCT: 32.8 % — ABNORMAL LOW (ref 39.0–52.0)
Hemoglobin: 9.8 g/dL — ABNORMAL LOW (ref 13.0–17.0)
MCH: 28.7 pg (ref 26.0–34.0)
MCHC: 29.9 g/dL — ABNORMAL LOW (ref 30.0–36.0)
MCV: 96.2 fL (ref 80.0–100.0)
Platelets: 338 10*3/uL (ref 150–400)
RBC: 3.41 MIL/uL — ABNORMAL LOW (ref 4.22–5.81)
RDW: 20.9 % — ABNORMAL HIGH (ref 11.5–15.5)
WBC: 14.4 10*3/uL — ABNORMAL HIGH (ref 4.0–10.5)
nRBC: 1.5 % — ABNORMAL HIGH (ref 0.0–0.2)

## 2021-11-28 LAB — TYPE AND SCREEN
ABO/RH(D): A POS
Antibody Screen: NEGATIVE

## 2021-11-28 LAB — APTT: aPTT: 28 seconds (ref 24–36)

## 2021-11-28 LAB — COMPREHENSIVE METABOLIC PANEL
ALT: 22 U/L (ref 0–44)
AST: 25 U/L (ref 15–41)
Albumin: 3 g/dL — ABNORMAL LOW (ref 3.5–5.0)
Alkaline Phosphatase: 104 U/L (ref 38–126)
Anion gap: 28 — ABNORMAL HIGH (ref 5–15)
BUN: 116 mg/dL — ABNORMAL HIGH (ref 6–20)
CO2: 14 mmol/L — ABNORMAL LOW (ref 22–32)
Calcium: 7.5 mg/dL — ABNORMAL LOW (ref 8.9–10.3)
Chloride: 94 mmol/L — ABNORMAL LOW (ref 98–111)
Creatinine, Ser: 23.72 mg/dL — ABNORMAL HIGH (ref 0.61–1.24)
GFR, Estimated: 2 mL/min — ABNORMAL LOW (ref >60)
Glucose, Bld: 138 mg/dL — ABNORMAL HIGH (ref 70–99)
Potassium: 3.5 mmol/L (ref 3.5–5.1)
Sodium: 136 mmol/L (ref 135–145)
Total Bilirubin: 0.9 mg/dL (ref 0.3–1.2)
Total Protein: 6.4 g/dL — ABNORMAL LOW (ref 6.5–8.1)

## 2021-11-28 LAB — CBG MONITORING, ED: Glucose-Capillary: 132 mg/dL — ABNORMAL HIGH (ref 70–99)

## 2021-11-28 LAB — ETHANOL: Alcohol, Ethyl (B): <10 mg/dL (ref <10)

## 2021-11-28 LAB — PROTIME-INR
INR: 1.2 (ref 0.8–1.2)
Prothrombin Time: 15 seconds (ref 11.4–15.2)

## 2021-11-28 LAB — MRSA NEXT GEN BY PCR, NASAL: MRSA by PCR Next Gen: NOT DETECTED

## 2021-11-28 MED ORDER — CLEVIDIPINE BUTYRATE 0.5 MG/ML IV EMUL
0.0000 mg/h | INTRAVENOUS | Status: DC
  Administered 2021-11-28: 17:00:00 1 mg/h via INTRAVENOUS

## 2021-11-28 MED ORDER — CALCIUM GLUCONATE 10 % IV SOLN: 1.0000 g | Freq: Once | INTRAVENOUS | Status: DC

## 2021-11-28 MED ORDER — STROKE: EARLY STAGES OF RECOVERY BOOK: Freq: Once | Status: DC

## 2021-11-28 MED ORDER — FENTANYL BOLUS VIA INFUSION
50.0000 ug | INTRAVENOUS | Status: DC | PRN
  Filled 2021-11-28: qty 50

## 2021-11-28 MED ORDER — IOHEXOL 350 MG/ML SOLN
100.0000 mL | Freq: Once | INTRAVENOUS | Status: AC | PRN
Start: 2021-11-28 — End: 2021-11-28
  Administered 2021-11-28: 17:00:00 100 mL via INTRAVENOUS

## 2021-11-28 MED ORDER — ACETAMINOPHEN 160 MG/5ML PO SOLN: 650.0000 mg | ORAL | Status: DC | PRN

## 2021-11-28 MED ORDER — SODIUM CHLORIDE 3 % IV BOLUS
250.0000 mL | Freq: Once | INTRAVENOUS | Status: AC
  Administered 2021-11-28: 17:00:00 250 mL via INTRAVENOUS
  Filled 2021-11-28: qty 500

## 2021-11-28 MED ORDER — ACETAMINOPHEN 325 MG PO TABS: 650.0000 mg | ORAL_TABLET | ORAL | Status: DC | PRN

## 2021-11-28 MED ORDER — ETOMIDATE 2 MG/ML IV SOLN
INTRAVENOUS | Status: AC | PRN
Start: 2021-11-28 — End: 2021-11-28
  Administered 2021-11-28: 20 mg via INTRAVENOUS

## 2021-11-28 MED ORDER — ACETAMINOPHEN 650 MG RE SUPP: 650.0000 mg | RECTAL | Status: DC | PRN

## 2021-11-28 MED ORDER — LABETALOL HCL 5 MG/ML IV SOLN
20.0000 mg | Freq: Once | INTRAVENOUS | Status: AC
  Administered 2021-11-28: 16:00:00 20 mg via INTRAVENOUS

## 2021-11-28 MED ORDER — SODIUM CHLORIDE 3 % IV SOLN
INTRAVENOUS | Status: DC
  Filled 2021-11-28 (×3): qty 500

## 2021-11-28 MED ORDER — FENTANYL 2500MCG IN NS 250ML (10MCG/ML) PREMIX INFUSION: 25.0000 ug/h | INTRAVENOUS | Status: DC

## 2021-11-28 MED ORDER — SENNOSIDES-DOCUSATE SODIUM 8.6-50 MG PO TABS: 1.0000 | ORAL_TABLET | Freq: Two times a day (BID) | ORAL | Status: DC

## 2021-11-28 MED ORDER — DEXAMETHASONE SODIUM PHOSPHATE 4 MG/ML IJ SOLN
4.0000 mg | Freq: Four times a day (QID) | INTRAMUSCULAR | Status: DC | PRN
  Administered 2021-11-28: 22:00:00 4 mg via INTRAVENOUS
  Filled 2021-11-28: qty 1

## 2021-11-28 MED ORDER — PANTOPRAZOLE SODIUM 40 MG IV SOLR: 40.0000 mg | Freq: Every day | INTRAVENOUS | Status: DC

## 2021-11-28 MED ORDER — PROPOFOL 1000 MG/100ML IV EMUL
0.0000 ug/kg/min | INTRAVENOUS | Status: DC
  Administered 2021-11-28: 16:00:00 20 ug/kg/min via INTRAVENOUS

## 2021-11-28 MED ORDER — FENTANYL CITRATE PF 50 MCG/ML IJ SOSY: 50.0000 ug | PREFILLED_SYRINGE | Freq: Once | INTRAMUSCULAR | Status: DC

## 2021-11-28 MED ORDER — ROCURONIUM BROMIDE 50 MG/5ML IV SOLN
INTRAVENOUS | Status: AC | PRN
Start: 2021-11-28 — End: 2021-11-28
  Administered 2021-11-28: 100 mg via INTRAVENOUS

## 2021-11-28 MED ORDER — ACETAMINOPHEN 160 MG/5ML PO SOLN: 650.0000 mg | Freq: Four times a day (QID) | ORAL | Status: DC | PRN

## 2021-11-28 MED ORDER — BISACODYL 10 MG RE SUPP: 10.0000 mg | Freq: Every day | RECTAL | Status: DC | PRN

## 2021-11-28 MED ORDER — POLYVINYL ALCOHOL 1.4 % OP SOLN
1.0000 [drp] | OPHTHALMIC | Status: DC | PRN
  Filled 2021-11-28: qty 15

## 2021-11-28 MED ORDER — FENTANYL CITRATE PF 50 MCG/ML IJ SOSY
25.0000 ug | PREFILLED_SYRINGE | INTRAMUSCULAR | Status: DC | PRN
  Administered 2021-11-28 (×2): 25 ug via INTRAVENOUS
  Filled 2021-11-28 (×2): qty 1

## 2021-11-28 MED ORDER — GLYCOPYRROLATE 0.2 MG/ML IJ SOLN
0.2000 mg | INTRAMUSCULAR | Status: DC | PRN
  Filled 2021-11-28: qty 1

## 2021-12-21 NOTE — Death Summary Note (Signed)
DEATH SUMMARY   Patient Details  Name: Larry [name] MRN: [id number] DOB: 18-Sep-1966  Admission/Discharge Information   Admit Date:  Dec 08, 2021  Date of Death: Date of Death: 2021/12/08  Time of Death: Time of Death: Dec 16, 2241  Length of Stay: 1  Referring Physician: Jon Billings, NP   Reason(s) for Hospitalization  Massive Intracranial hemorrhage with intraventricular extension and brain compression   Diagnoses  Preliminary cause of death: Intracerebral hemorrhage with intraventricular extension  Secondary Diagnoses (including complications and co-morbidities):  Principal Problem:   ICH (intracerebral hemorrhage) ([place]) Cerebral Edema Midline shift Brain Compression Acute respiratory insufficiency  Endotracheal tube present  Paroxysmal Atrial Fibrillation ESRD  Anion gap metabolic acidosis  Leukocytosis  Brief Hospital Course (including significant findings, care, treatment, and services provided and events leading to death)  Larry [name] is a 56 y.o. year old male who presented to [facility] ED 12-08-21 via EMS as a code stroke. EMS was dispatched for new onset stumbling gait, right sided gaze deviation and left sided weakness. During transit, he decompensated and upon arrival to emergency department required intubation. CT H was obtained and revealed a massive right sided ICH with intraventricular extension, midline shift, cerebral edema -- known as brain compression. ICH score: 4. He was started on hypertonic saline and cleviprex per neurology. The case was discussed with neurosurgery, but unfortunately there was not a surgical intervention. Pulmonary critical care was consulted for admission in this setting.  In discussion with family at the bedside regarding this devastating, non-survivable bleed, a plan was reached to update code status to DNR with a plan to transition to comfort care when family had gathered to say goodbye.  12-08-2021 night, the patient was compassionately  transitioned to comfort care and died December 08, 2021 at  22:43    Pertinent Labs and Studies  Significant Diagnostic Studies DG Chest Portable 1 View  Result Date: 12-08-2021 CLINICAL DATA:  Intubation. EXAM: PORTABLE CHEST 1 VIEW COMPARISON:  Chest x-ray 07/09/2021. FINDINGS: Endotracheal tube tip is 5.2 cm above the carina. Enteric tube extends below the diaphragm. The heart is mildly enlarged, a new finding. There is a small left pleural effusion with left basilar opacities. There is central pulmonary vascular congestion. No pneumothorax or acute fracture. IMPRESSION: 1. New enlargement of the cardiac silhouette may represent cardiomegaly versus pericardial effusion. 2. Central pulmonary vascular congestion. 3. Small left pleural effusion with left basilar atelectasis/airspace disease. 4. Endotracheal tube tip is 5.2 cm above the carina. Electronically Signed   By: Ronney Asters M.D.   On: 12/08/21 17:05   CT HEAD CODE STROKE WO CONTRAST  Result Date: 08-Dec-2021 CLINICAL DATA:  Code stroke. Neuro deficit, acute, stroke suspected. EXAM: CT HEAD WITHOUT CONTRAST TECHNIQUE: Contiguous axial images were obtained from the base of the skull through the vertex without intravenous contrast. RADIATION DOSE REDUCTION: This exam was performed according to the departmental dose-optimization program which includes automated exposure control, adjustment of the mA and/or kV according to patient size and/or use of iterative reconstruction technique. COMPARISON:  Head CT 06/08/2016. FINDINGS: Brain: There is a very large heterogeneous hyperacute/acute hemorrhage centered within the right frontal lobe, right basal ganglia/thalamus and right temporal lobe measuring 9.8 x 6.6 x 7.2 cm. Surrounding edema. Mass effect with significant partial effacement of the right lateral ventricle and 2.2 cm leftward midline shift measured at the level of the septum pellucidum. The basal cisterns are effaced. There is moderate volume  intraventricular extension of hemorrhage into the right lateral, third and fourth  ventricles. Additionally, there is mild subarachnoid extension of hemorrhage at the level of the foramen magnum and imaged upper cervical spinal canal. There is an additional linear focus of hyperdensity coursing in the anteroposterior direction within the pons (for instance as seen on series 6, image 35) (series 5, image 43). This likely also reflects a focus of parenchymal hemorrhage. No demarcated cortical infarct. Vascular: No hyperdense vessel. Atherosclerotic calcifications. Skull: Normal. Negative for fracture or focal lesion. Sinuses/Orbits: Visualized orbits show no acute finding. No significant paranasal sinus disease at the imaged levels. Large left middle ear/mastoid effusion. These results were called by telephone at the time of interpretation on Dec 23, 2021 at 4:55 pm to provider Dr. Rory Percy, who verbally acknowledged these results. IMPRESSION: Very large hyperacute/acute parenchymal hemorrhage centered within the right frontal lobe, right basal ganglia/thalamus and right temporal lobe measuring 9.8 x 6.6 x 7.2 cm. Surrounding edema. Associated marked mass effect with significant partial effacement of the right lateral ventricle and 2.2 cm leftward midline shift. The basal cisterns are effaced. Associated moderate-volume intraventricular extension of hemorrhage into the right lateral, third and fourth ventricles. There is also mild subarachnoid extension of hemorrhage at the level of the foramen magnum and imaged upper cervical spinal canal. Additional linear focus of hyperdensity coursing in the anteroposterior direction within the central pons, likely reflecting an additional site of acute parenchymal hemorrhage. Large left middle ear/mastoid effusion. Electronically Signed   By: Kellie Simmering D.O.   On: December 23, 2021 17:04   CT ANGIO HEAD NECK W WO CM (CODE STROKE)  Result Date: 2021-12-23 CLINICAL DATA:  Stroke follow-up.  EXAM: CT ANGIOGRAPHY HEAD AND NECK TECHNIQUE: Multidetector CT imaging of the head and neck was performed using the standard protocol during bolus administration of intravenous contrast. Multiplanar CT image reconstructions and MIPs were obtained to evaluate the vascular anatomy. Carotid stenosis measurements (when applicable) are obtained utilizing NASCET criteria, using the distal internal carotid diameter as the denominator. RADIATION DOSE REDUCTION: This exam was performed according to the departmental dose-optimization program which includes automated exposure control, adjustment of the mA and/or kV according to patient size and/or use of iterative reconstruction technique. CONTRAST:  195mL OMNIPAQUE IOHEXOL 350 MG/ML SOLN COMPARISON:  None. FINDINGS: CTA NECK FINDINGS Aortic arch: Normal variant aortic arch branching pattern with common origin of the brachiocephalic and left common carotid arteries. Mild, nonstenotic calcified plaque in the brachiocephalic and subclavian arteries. Right carotid system: Patent with scattered calcified and soft plaque in the common carotid and proximal internal carotid arteries. No evidence of a significant stenosis or dissection. Left carotid system: Patent with scattered calcified and soft plaque in the common carotid artery and a moderate amount of calcified plaque about the carotid bifurcation. No evidence of a significant stenosis or dissection. Vertebral arteries: Patent with the right vertebral artery being strongly dominant. Nonstenotic plaque at both vertebral origins. Scattered calcified plaque in the right V2 segment resulting in less than 50% stenosis. Skeleton: Focally advanced facet arthritis on the left at C3-4 with erosions. Other neck: No evidence of cervical lymphadenopathy or mass. Upper chest: Partially visualized moderate bilateral pleural effusions with associated compressive atelectasis, left greater than right. Endotracheal tube terminating above the  carina. Review of the MIP images confirms the above findings CTA HEAD FINDINGS Anterior circulation: The internal carotid arteries are patent from skull base to carotid termini with mild stenosis of the right supraclinoid ICA. ACAs and MCAs are patent without evidence of a proximal branch occlusion or significant proximal stenosis. Right MCA and  ACA branches are displaced by the large right cerebral hemorrhage. No aneurysm, vascular malformation, or CTA spot sign is identified. Posterior circulation: The intracranial vertebral arteries are patent to the basilar with atherosclerotic plaque resulting in multifocal stenoses which are mild on the right and up to severe on the left. Patent PICA origins are seen bilaterally. The left SCA appears occluded proximally near its origin. The basilar artery is widely patent. Both PCAs are patent with mild atherosclerotic irregularity but no evidence of a significant proximal stenosis. Posterior communicating arteries are diminutive or absent. No aneurysm is identified. Venous sinuses: Patent. Anatomic variants: None. Delayed phase: Large parenchymal hemorrhage in the right cerebral hemisphere centered in the basal ganglia/thalamus with intraventricular and subarachnoid extension as described on the preceding noncontrast head CT. No definite abnormal intracranial enhancement. Review of the MIP images confirms the above findings IMPRESSION: 1. No large vessel occlusion. 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 3. Intracranial atherosclerosis with stenoses as above. 4. Cervical carotid atherosclerosis without significant stenosis. 5. Moderate bilateral pleural effusions. 6. Aortic Atherosclerosis (ICD10-I70.0). Electronically Signed   By: Logan Bores M.D.   On: 12-21-21 17:12    Microbiology Recent Results (from the past 240 hour(s))  Resp Panel by RT-PCR (Flu A&B, Covid) Nasopharyngeal Swab     Status: None   Collection Time: Dec 21, 2021  4:08 PM   Specimen: Nasopharyngeal  Swab; Nasopharyngeal(NP) swabs in vial transport medium  Result Value Ref Range Status   SARS Coronavirus 2 by RT PCR NEGATIVE NEGATIVE Final    Comment: (NOTE) SARS-CoV-2 target nucleic acids are NOT DETECTED.  The SARS-CoV-2 RNA is generally detectable in upper respiratory specimens during the acute phase of infection. The lowest concentration of SARS-CoV-2 viral copies this assay can detect is 138 copies/mL. A negative result does not preclude SARS-Cov-2 infection and should not be used as the sole basis for treatment or other patient management decisions. A negative result may occur with  improper specimen collection/handling, submission of specimen other than nasopharyngeal swab, presence of viral mutation(s) within the areas targeted by this assay, and inadequate number of viral copies(<138 copies/mL). A negative result must be combined with clinical observations, patient history, and epidemiological information. The expected result is Negative.  Fact Sheet for Patients:  EntrepreneurPulse.com.au  Fact Sheet for Healthcare Providers:  IncredibleEmployment.be  This test is no t yet approved or cleared by the [place] FDA and  has been authorized for detection and/or diagnosis of SARS-CoV-2 by FDA under an Emergency Use Authorization (EUA). This EUA will remain  in effect (meaning this test can be used) for the duration of the COVID-19 declaration under Section 564(b)(1) of the Act, 21 U.S.C.section 360bbb-3(b)(1), unless the authorization is terminated  or revoked sooner.       Influenza A by PCR NEGATIVE NEGATIVE Final   Influenza B by PCR NEGATIVE NEGATIVE Final    Comment: (NOTE) The Xpert Xpress SARS-CoV-2/FLU/RSV plus assay is intended as an aid in the diagnosis of influenza from Nasopharyngeal swab specimens and should not be used as a sole basis for treatment. Nasal washings and aspirates are unacceptable for Xpert Xpress  SARS-CoV-2/FLU/RSV testing.  Fact Sheet for Patients: EntrepreneurPulse.com.au  Fact Sheet for Healthcare Providers: IncredibleEmployment.be  This test is not yet approved or cleared by the [place] FDA and has been authorized for detection and/or diagnosis of SARS-CoV-2 by FDA under an Emergency Use Authorization (EUA). This EUA will remain in effect (meaning this test can be used) for the duration of  the COVID-19 declaration under Section 564(b)(1) of the Act, 21 U.S.C. section 360bbb-3(b)(1), unless the authorization is terminated or revoked.  Performed at [facility], [place] [address] [place], [place] [address]   MRSA Next Gen by PCR, Nasal     Status: None   Collection Time: 12/14/2021  8:42 PM   Specimen: Nasal Mucosa; Nasal Swab  Result Value Ref Range Status   MRSA by PCR Next Gen NOT DETECTED NOT DETECTED Final    Comment: (NOTE) The GeneXpert MRSA Assay (FDA approved for NASAL specimens only), is one component of a comprehensive MRSA colonization surveillance program. It is not intended to diagnose MRSA infection nor to guide or monitor treatment for MRSA infections. Test performance is not FDA approved in patients less than 78 years old. Performed at [facility], [place] [address] [place],  [address]     Lab Basic Metabolic Panel: Recent Labs  Lab 2021-12-14 1608 12-14-21 1700  NA 136 133*  K 3.5 3.2*  CL 94*  --   CO2 14*  --   GLUCOSE 138*  --   BUN 116*  --   CREATININE 23.72*  --   CALCIUM 7.5*  --    Liver Function Tests: Recent Labs  Lab 12-14-2021 1608  AST 25  ALT 22  ALKPHOS 104  BILITOT 0.9  PROT 6.4*  ALBUMIN 3.0*   No results for input(s): LIPASE, AMYLASE in the last 168 hours. No results for input(s): AMMONIA in the last 168 hours. CBC: Recent Labs  Lab 2021-12-14 1608 12/14/2021 1700  WBC 14.4*  --   NEUTROABS 8.5*  --   HGB 9.8* 9.9*  HCT 32.8* 29.0*  MCV 96.2  --   PLT  338  --    Cardiac Enzymes: No results for input(s): CKTOTAL, CKMB, CKMBINDEX, TROPONINI in the last 168 hours. Sepsis Labs: Recent Labs  Lab 12/14/21 1608  WBC 14.4*    Procedures/Operations  December 14, 2021- endotracheal intubation    Laurel Dimmer Azrael Maddix 11/29/2021, 7:04 AM

## 2021-12-21 NOTE — Code Documentation (Signed)
Pt intubated by [employer]. ?

## 2021-12-21 NOTE — Consult Note (Addendum)
Neurology Consultation  Reason for Consult: Code stroke Referring Physician: Dr. Billy Fischer  CC: L sided weakness  History is obtained from: EMS, chart review   HPI: RAFORD BRISSETT is a 56 y.o. male with a past medical history of ESRD on PD as well as paroxysmal AF not on anticoagulation, HFrEF (most recent EF recovered to ~45% on Nov 2022).  Per EMS report, patient was last known well at 1515 this afternoon.  He was at home with his family in his usual state of health when he went to use the restroom and was noted to have a stumbling gait.  EMS was called and on their arrival they noted a right-sided gaze and left-sided weakness.  He was reportedly responsive and answering questions initially but during transport became unresponsive to verbal stimulus.  On initial assessment in the emergency department the patient only withdrew to painful stimulus.  He was intubated for airway protection, which delayed CT head.  CT head was notable for large volume intracerebral hemorrhage.  Per chart review, the patient developed new onset A-fib with RVR during dialysis at a hospitalization in March 2022.  Per the discharge summary, cardiology recommended the patient to continue on Toprol 100 mg daily and amiodarone 200 mg twice daily no plan to anticoagulate unless he developed another episode of A-fib.  LKW: 1515 TNK given?: no, CTH showing ICH IR Thrombectomy? No, CTH showing ICH Modified Rankin Scale: 0-Completely asymptomatic and back to baseline post- stroke Intracerebral Hemorrhage (ICH) Score Glascow Coma Score  3-4  +2 Age >/= 80 no 0 ICH volume >/= 77ml  yes +1 IVH yes +1 Infratentorial origin no 0 Total:  4   ROS: Unable to obtain due to altered mental status.   Past Medical History:  Diagnosis Date   Anemia    ESRD on peritoneal dialysis ([place])    Hypertension    Nephrotic syndrome      Family History  Problem Relation Age of Onset   Diabetes Mother    Kidney disease Mother         mother on dialysis   CAD Mother    Diabetes Sister    CAD Sister      Social History:   reports that he quit smoking about 4 months ago. His smoking use included cigarettes. He has a 20.00 pack-year smoking history. He has never used smokeless tobacco. He reports that he does not currently use alcohol. He reports that he does not use drugs.   Medications  Current Facility-Administered Medications:     stroke: mapping our early stages of recovery book, , Does not apply, Once, Toberman, Stevi W, NP   acetaminophen (TYLENOL) tablet 650 mg, 650 mg, Oral, Q4H PRN **OR** acetaminophen (TYLENOL) 160 MG/5ML solution 650 mg, 650 mg, Per Tube, Q4H PRN **OR** acetaminophen (TYLENOL) suppository 650 mg, 650 mg, Rectal, Q4H PRN, Rikki Spearing, NP   clevidipine (CLEVIPREX) infusion 0.5 mg/mL, 0-21 mg/hr, Intravenous, Continuous, Schlossman, Erin, MD, Last Rate: 2 mL/hr at 12-02-21 1644, 1 mg/hr at 2021/12/02 1644   fentaNYL (SUBLIMAZE) bolus via infusion 50 mcg, 50 mcg, Intravenous, Q1H PRN, Gareth Morgan, MD   fentaNYL (SUBLIMAZE) injection 50 mcg, 50 mcg, Intravenous, Once, Gareth Morgan, MD   fentaNYL 2552mcg in NS 259mL (52mcg/ml) infusion-PREMIX, 25-400 mcg/hr, Intravenous, Continuous, Schlossman, Erin, MD   ipratropium-albuterol (DUONEB) 0.5-2.5 (3) MG/3ML nebulizer solution 3 mL, 3 mL, Nebulization, Once, McElwee, Lauren A, NP   pantoprazole (PROTONIX) injection 40 mg, 40 mg, Intravenous, QHS, Anibal Henderson  W, NP   propofol (DIPRIVAN) 1000 MG/100ML infusion, 0-50 mcg/kg/min, Intravenous, Continuous, Gareth Morgan, MD, Stopped at December 10, 2021 1704   senna-docusate (Senokot-S) tablet 1 tablet, 1 tablet, Per Tube, BID, Ebbie Latus, Roney Mans, NP   sodium chloride (hypertonic) 3 % solution, , Intravenous, Continuous, Schlossman, Erin, MD   sodium chloride 3% (hypertonic) IV bolus 250 mL, 250 mL, Intravenous, Once, Gareth Morgan, MD, Last Rate: 500 mL/hr at December 10, 2021 1700, 250 mL at 12/10/2021  1700  Current Outpatient Medications:    acetaminophen (TYLENOL) 500 MG tablet, Take 500-1,000 mg by mouth every 6 (six) hours as needed for mild pain or moderate pain., Disp: , Rfl:    albuterol (PROVENTIL) (2.5 MG/3ML) 0.083% nebulizer solution, USE ONE VIAL IN NEBULIZER EVERY 6 HOURS AS NEEDED FOR WHEEZING OR SHORTNESS OF BREATH., Disp: 75 mL, Rfl: 1   albuterol (VENTOLIN HFA) 108 (90 Base) MCG/ACT inhaler, Inhale 2 puffs into the lungs every 6 (six) hours as needed for wheezing or shortness of breath., Disp: 8 g, Rfl: 1   amLODipine (NORVASC) 10 MG tablet, Take 1 tablet (10 mg total) by mouth daily., Disp: 30 tablet, Rfl: 5   aspirin 81 MG EC tablet, Take 1 tablet by mouth daily., Disp: , Rfl:    atorvastatin (LIPITOR) 80 MG tablet, Take 1 tablet by mouth daily., Disp: , Rfl:    bumetanide (BUMEX) 2 MG tablet, Take 1 tablet by mouth daily., Disp: , Rfl:    carvedilol (COREG) 25 MG tablet, Take 25 mg by mouth 2 (two) times daily., Disp: , Rfl:    doxycycline (VIBRA-TABS) 100 MG tablet, Take 1 tablet (100 mg total) by mouth 2 (two) times daily., Disp: 14 tablet, Rfl: 0   losartan (COZAAR) 100 MG tablet, Take 100 mg by mouth daily., Disp: , Rfl:    montelukast (SINGULAIR) 10 MG tablet, TAKE 1 TABLET BY MOUTH EVERYDAY AT BEDTIME, Disp: 90 tablet, Rfl: 1   ondansetron (ZOFRAN) 4 MG tablet, Take 1 tablet (4 mg total) by mouth daily as needed for nausea or vomiting., Disp: 10 tablet, Rfl: 0   sertraline (ZOLOFT) 50 MG tablet, Take 1 tablet (50 mg total) by mouth daily., Disp: 90 tablet, Rfl: 1   tamsulosin (FLOMAX) 0.4 MG CAPS capsule, Take 1 tablet by mouth daily., Disp: , Rfl:    traZODone (DESYREL) 50 MG tablet, TAKE 0.5-1 TABLETS BY MOUTH AT BEDTIME AS NEEDED FOR SLEEP., Disp: 90 tablet, Rfl: 1   Exam: Current vital signs: BP 120/61    Pulse 80    Resp (!) 6    Wt 77.9 kg    SpO2 100%    BMI 27.72 kg/m  Vital signs in last 24 hours: Pulse Rate:  [80-89] 80 (03/09 1700) Resp:  [6-22] 6  (03/09 1700) BP: (120-240)/(61-127) 120/61 (03/09 1700) SpO2:  [95 %-100 %] 100 % (03/09 1700) Weight:  [77.9 kg] 77.9 kg (03/09 1626)  GENERAL: obtunded, withdraws to pain Head: Normocephalic and atraumatic, without obvious abnormality LUNGS: stridorous breaths  NEURO:  Mental Status: obtunded, withdraws to pain Speech/Language: speech is absent No neglect is noted Cranial Nerves:  II: L pupil 7 mm, non-reactive; R pupil 7 mm irregular, non-reactive  III, IV, VI: unable to test EOM. V: unable to assess VII: unable to assess VIII: unable to assess IX, X: unable to assess XI: unable to assess XII: unable to assess Motor: withdraws to pain Sensation: unable to assess  NIHSS: 1a Level of Consciousness: 2  1b LOC Questions: 2 1c LOC  Commands: 2 2 Best Gaze: 0 3 Visual: 3 4 Facial Palsy: 0 5a Motor Arm - left: 4  5b Motor Arm - Right: 3  6a Motor Leg - Left: 3 6b Motor Leg - Right: 3 7 Limb Ataxia: 0 8 Sensory: 0 9 Best Language: 3  10 Dysarthria: 2 11 Extinct. and Inatten.: 0  TOTAL: 27  Labs I have reviewed labs in epic and the results pertinent to this consultation are: hgb of 9.9, cr of 24, Plts and INR normal  CBC    Component Value Date/Time   WBC 14.4 (H) Dec 15, 2021 1608   RBC 3.41 (L) 12/15/2021 1608   HGB 9.9 (L) 12-15-2021 1700   HGB 8.4 (L) 08/09/2021 1109   HCT 29.0 (L) 12-15-2021 1700   HCT 25.3 (L) 08/09/2021 1109   PLT 338 Dec 15, 2021 1608   PLT 342 08/09/2021 1109   MCV 96.2 December 15, 2021 1608   MCV 86 08/09/2021 1109   MCV 93 01/19/2015 0545   MCH 28.7 Dec 15, 2021 1608   MCHC 29.9 (L) 2021-12-15 1608   RDW 20.9 (H) 15-Dec-2021 1608   RDW 16.0 (H) 08/09/2021 1109   RDW 13.5 01/19/2015 0545   LYMPHSABS 3.6 12-15-21 1608   LYMPHSABS 2.0 08/09/2021 1109   LYMPHSABS 1.6 01/19/2015 0545   MONOABS 1.3 (H) 2021/12/15 1608   MONOABS 0.8 01/19/2015 0545   EOSABS 0.7 (H) 2021/12/15 1608   EOSABS 1.3 (H) 08/09/2021 1109   EOSABS 0.4 01/19/2015 0545    BASOSABS 0.1 2021/12/15 1608   BASOSABS 0.1 08/09/2021 1109   BASOSABS 0.1 01/19/2015 0545    CMP     Component Value Date/Time   NA 133 (L) 12/15/2021 1700   NA 143 08/09/2021 1109   NA 138 01/19/2015 0545   K 3.2 (L) 12-15-2021 1700   K 3.9 01/19/2015 0545   CL 97 08/09/2021 1109   CL 114 (H) 01/19/2015 0545   CO2 17 (L) 08/09/2021 1109   CO2 18 (L) 01/19/2015 0545   GLUCOSE 139 (H) 08/09/2021 1109   GLUCOSE 90 05/13/2021 2127   GLUCOSE 95 01/19/2015 0545   BUN 83 (HH) 08/09/2021 1109   BUN 48 (H) 01/19/2015 0545   CREATININE 15.83 (HH) 08/09/2021 1109   CREATININE 5.66 (H) 01/19/2015 0545   CALCIUM 8.2 (L) 08/09/2021 1109   CALCIUM 8.0 (L) 01/19/2015 0545   PROT 5.7 (L) 08/09/2021 1109   PROT 6.0 (L) 01/01/2015 0748   ALBUMIN 3.6 (L) 08/09/2021 1109   ALBUMIN 2.6 (L) 01/01/2015 0748   AST 35 08/09/2021 1109   AST 23 01/01/2015 0748   ALT 48 (H) 08/09/2021 1109   ALT 18 01/01/2015 0748   ALKPHOS 103 08/09/2021 1109   ALKPHOS 65 01/01/2015 0748   BILITOT 0.2 08/09/2021 1109   BILITOT <0.1 (L) 01/01/2015 0748   GFRNONAA 3 (L) 05/13/2021 2127   GFRNONAA 11 (L) 01/19/2015 0545   GFRAA 21 (L) 12/14/2018 1430   GFRAA 13 (L) 01/19/2015 0545    Lipid Panel     Component Value Date/Time   CHOL 140 12/06/2020 0520   TRIG 154 (H) 12/06/2020 0520   HDL 27 (L) 12/06/2020 0520   CHOLHDL 5.2 12/06/2020 0520   VLDL 31 12/06/2020 0520   LDLCALC 82 12/06/2020 0520     Imaging I have reviewed the images obtained:  CT-scan of the brain IMPRESSION: Very large hyperacute/acute parenchymal hemorrhage centered within the right frontal lobe, right basal ganglia/thalamus and right temporal lobe measuring 9.8 x 6.6 x 7.2 cm.  Surrounding edema. Associated marked mass effect with significant partial effacement of the right lateral ventricle and 2.2 cm leftward midline shift. The basal cisterns are effaced.   Associated moderate-volume intraventricular extension of  hemorrhage into the right lateral, third and fourth ventricles. There is also mild subarachnoid extension of hemorrhage at the level of the foramen magnum and imaged upper cervical spinal canal.   Additional linear focus of hyperdensity coursing in the anteroposterior direction within the central pons, likely reflecting an additional site of acute parenchymal hemorrhage.   Large left middle ear/mastoid effusion.   Assessment: TAEDEN GELLER is a 56 y.o. male with a past medical history of ESRD on PD as well as paroxysmal AF not on anticoagulation, HFrEF (most recent EF recovered to ~45% on Nov 2022), who presents with a large volume intracerebral hemorrhage, currently intubated.  Plan: #Right frontal, basal ganglia/thalamus and right temporal lobe ICH #Cerebellum ICH, nontraumatic #Cerebral edema with brain compression  Acuity: Acute Laterality: Right Current suspected etiology: hypertensive ICH Treatment: -Likely admit to ICU -ICH Score: 4 -ICH Volume: > 57mL, about 115 mL -BP control goal SYS 130-150 -NSGY Consult for evaluation of appropriateness of hematoma evacuation/decompressive hemicraniotomy -PT/OT/ST  -Close neuromonitoring   CNS #Cerebral edema #Compression of brain -Hyperosmolar therapy; 250 cc bolus of 3% saline followed by an infusion at 75 cc/h  -NSGY consult  -Close neuro monitoring   RESP #Acute Respiratory Failure  -vent management per EDP/ICU   CV #HTN, HTN emergency -Aggressive BP control, goal SBP 130 - 150 -Cleviprex infusion for blood pressure management    #Paroxysmal atrial -fibrillation not on anticoagulation  -Rate control per EDP/ICU. No anticoagulation due to bleed.   GI/GU #ESRD on PD -Consider renal consult if remains full code   HEME #Anemia in CKD -Monitor -Transfuse for hgb < 7  Fluid/Electrolyte Disorders -Follow up serial CMPs     Prophylaxis DVT: SCDs GI: PPI Bowel: NA   Dispo: likely progress to death soon due  to massive IVH, ICH and brainstem compression due to cytotoxic edema   Diet: NPO   Code Status: Full Code, discussion ongoing with family   THE FOLLOWING WERE PRESENT ON ADMISSION: CNS -  Cerebral Edema, ICH, Hemiparesis, Hemiplegia, IVH Respiratory - Ventilator dependent due to failure to protect airway Cardiovascular - Hypertensive Emergency, Cardiac Arrhythmia Renal -  ESRD on home PD, Hyponatremia Heme-   Anemia   Corky Sox, MD PGY-1   Attending Neurohospitalist Addendum Patient seen and examined with APP/Resident. Agree with the history and physical as documented above. Agree with the plan as documented, which I helped formulate. I have independently reviewed the chart, obtained history, review of systems and examined the patient.I have personally reviewed pertinent head/neck/spine imaging (CT/MRI).  Rapidly deteriorating exam-according to EMS, he was still awake and talking and during the 20-minute ride became completely obtunded.  Initially right-sided gaze with left-sided weakness on EMS exam.  On my exam GCS of 3-4. ICH score 4 with a large greater than 200 cc hemorrhage which is nonsurvivable. Discussed with neurosurgery-do not feel that any intervention would be of any use at this time. Discussed with the family in detail. Wife and daughter at bedside.  Answered all the questions. They are still extremely shocked.  On the conversations of CODE STATUS, they said that he wanted to be resuscitated if his heart were to stop but I told them that at this point with his brain damage and renal function with creatinine above 20, the CPR essentially would  be futile.  They have agreed to make him a DO NOT RESUSCITATE.  I would encourage the ICU team to confirm this with them. Most likely etiology of his bleed is hypertension. He is unlikely to survive this massive intracerebral hemorrhage, intraventricular hemorrhage with brain compression and midline shift at this time.  Plan  was discussed with family members as well as ED providers on multiple occasions.  Please feel free to call with any questions.  -- Amie Portland, MD Neurologist [facility] Pager: [phone]   CRITICAL CARE ATTESTATION Performed by: Amie Portland, MD Total critical care time: 70 minutes Critical care time was exclusive of separately billable procedures and treating other patients and/or supervising APPs/Residents/Students Critical care was necessary to treat or prevent imminent or life-threatening deterioration due to [place]  This patient is critically ill and at significant risk for neurological worsening and/or death and care requires constant monitoring. Critical care was time spent personally by me on the following activities: development of treatment plan with patient and/or surrogate as well as nursing, discussions with consultants, evaluation of patient's response to treatment, examination of patient, obtaining history from patient or surrogate, ordering and performing treatments and interventions, ordering and review of laboratory studies, ordering and review of radiographic studies, pulse oximetry, re-evaluation of patient's condition, participation in multidisciplinary rounds and medical decision making of high complexity in the care of this patient.

## 2021-12-21 NOTE — ED Notes (Signed)
Propofol started at 20mg  per verbal Neuro MD and [place] through 20g Lt AC IV. Started by Trudee Kuster MD. ?

## 2021-12-21 NOTE — Code Documentation (Signed)
Stroke Response Nurse Documentation ?Code Documentation ? ?Larry [name] is a 56 y.o. male arriving to [facility]  via [facility] on 2021-12-17 with past medical hx of ESRD on peritoneal dialysis with last session 3/8 overnight, HTN, anemia. On aspirin 81 mg daily. Code stroke was activated by EMS.  ? ?Patient from home where he was LKW at 1515 with sudden onset of left arm numbness and difficulty walking to bathroom. Wife called 911.  Patient was initially communicating with EMS though only responsive to pain with blown right pupil on arrival to ED.  ? ?Stroke team at the bedside on patient arrival. Patient taken to ED21 for intubation prior to CT. Labs drawn and patient cleared for CT by Dr. Billy Fischer. Patient to CT with team. NIHSS 27, see documentation for details and code stroke times. Patient with decreased LOC, disoriented, not following commands, bilateral hemianopia, bilateral arm weakness, bilateral leg weakness, Global aphasia , and dysarthria  on exam.  ? ?The following imaging was completed:  CT Head. Patient is not a candidate for IV Thrombolytic due to [place]. Patient is not not a candidate for IR due to [place].  ? ?Care Plan: SBP 130-150, q1h NIH.  ? ?Bedside handoff with ED RN Alexa.   ? ?Larry [name]  ?Stroke Response RN ? ? ?

## 2021-12-21 NOTE — ED Notes (Signed)
Pt transported to CT ?

## 2021-12-21 NOTE — ED Triage Notes (Signed)
Pt bib [facility] for possible stroke. Pt LKW was 1515 today. Pt got up to go to the bathroom and family noticed he was having difficulty walking. Pt was having Lt side weakness. Family sat pt down and EMS was called out. Enroute pt become responsive only to painful, pupils became unequal. Pt vomited and EMS suctioned. Upon arrival pupils were 7 blown and non reactive. Pt was unresponsive, unable to protect his own airway. Pt brought back to rm 21, RSI was performed. ETT in place. EDP, pharmacy, RT, Neuro MD at bedside.  ? ?EMS vitals ?16G IV ?210/130 BP ?CBG 103 ?

## 2021-12-21 NOTE — Progress Notes (Signed)
Chaplain responded to request from RN for family support.  Patient arrived code stroke and medical team has assessed.  Wife, daughter and son present.  Other family on the way.  The patient's condition and prognosis is hard for the family to process especially since today is patient's birthday.  Chaplain provided ministry of presence allowing space for the family to share about the patient and his great love of his family and as son said, would do anything for anyone.  Patient has 8 grandchildren.  Chaplain offered washcloth for spouse to wash the patient's hands and face.  Family hopeful the patient can get moved to a room so more family can be present.  Chaplain offered prayer as faith is important to the patient and family, patient identifies as [place].   ?Chaplain available as needed. ?[place], [place].   ? ? ? 12-03-2021 1831  ?Clinical Encounter Type  ?Visited With Patient and family together;Health care provider  ?Visit Type Initial;Spiritual support;ED;Critical Care  ?Referral From Nurse  ?Consult/Referral To Chaplain  ?Stress Factors  ?Family Stress Factors Health changes  ? ? ?

## 2021-12-21 NOTE — H&P (Signed)
NAME:  Larry [name], MRN:  [id number], DOB:  12-31-1965, LOS: 0 ADMISSION DATE:  Dec 16, 2021, CONSULTATION DATE:  2021-12-16 REFERRING MD:  Rory Percy - Neuro, CHIEF COMPLAINT:  ICH   History of Present Illness:  56 yo M PMH ESRD on PD, Afib not on chronic AC, HFrEF LKN December 16, 2021 1515. He was then found to have stumbling gait, R sided gaze, L sided weakness, who was brought to ED as code stoke. During transport, pt mentation deteriorated. On arrival to ED, neuro exam was such that the pt could only withdraw to pain, and was intubated in this setting.   CT H obtained which revealed large ICH with brain compression. Started on cleviprex and 3% saline. At time of [place] conversation between family and neuro, family desired Full Code, full scope of care. NSGY was engaged and unfortunately there is not a surgical intervention in this situation.   PCCM consulted for admission in this setting.   Pertinent  Medical History  ESRD HFrEF HTN Afib  Significant Hospital Events: Including procedures, antibiotic start and stop dates in addition to other pertinent events   3/9 Devastating ICH. Intubated. Started on 3% and clevi. PCCM consulted   Interim History / Subjective:  Wife, daughter at bedside with chaplain.   Objective   Blood pressure (!) 157/85, pulse 79, resp. rate (!) 26, height 5' 5.5" (1.664 m), weight 77.9 kg, SpO2 100 %.    Vent Mode: PRVC FiO2 (%):  [60 %] 60 % Set Rate:  [26 bmp] 26 bmp Vt Set:  [500 mL] 500 mL PEEP:  [8 cmH20] 8 cmH20 Plateau Pressure:  [20 cmH20] 20 cmH20   Intake/Output Summary (Last 24 hours) at 2021-12-16 1825 Last data filed at Dec 16, 2021 1728 Gross per 24 hour  Intake 238.9 ml  Output --  Net 238.9 ml   Filed Weights   12-16-21 1626  Weight: 77.9 kg    Examination: General: Critically ill middle aged M intubated  HENT: NCAT  Lungs: CTAb mechanically ventilated  Cardiovascular: rr. S1s2 no rgm  Abdomen: PD catheter. Soft, distended Extremities: no acute  joint deformity. Mild BLE edema  Neuro: Fixed and dilated pupils. Initiates Respiration on vent. No response to pain  GU: defer  Resolved Hospital Problem list     Assessment & Plan:   Large Right frontal, basal ganglia/thalamus, temporal ICH with intraventricular extension into R lateral, 3rd and 4th ventricles, with cerebral edema and 2.2cm leftward midline shift ("brain compression") - present on admission Acute respiratory insufficiency / Endotracheal tube present due to large ICH  Goals of Care / Encounter for palliative care  -CT H personally reviewed. ICH score 4 -- expected mortality 97% P -started on 3% and cleviprex in ED -goal SBP 130-150 -Cont MV support -On meeting the family and discussing clinical case, I discussed goals of care: code status was updated to DNR and plan of care is to continue current supports with hope of facilitating additional family/friends to say goodbye before transitioning to comfort care  (See separate Note for detailed discussion)  -would not follow BMP, CBC given poor prognosis   ESRD on PD -defer nephro consult given devastating, non-survivable intracranial bleed   Best Practice (right click and "Reselect all SmartList Selections" daily)   Diet/type: NPO DVT prophylaxis: not indicated GI prophylaxis: N/A Lines: N/A Foley:  N/A Code Status:  DNR Last date of multidisciplinary goals of care discussion [3/9]  Labs   CBC: Recent Labs  Lab 12/16/2021 1608 December 16, 2021 1700  WBC  14.4*  --   NEUTROABS 8.5*  --   HGB 9.8* 9.9*  HCT 32.8* 29.0*  MCV 96.2  --   PLT 338  --     Basic Metabolic Panel: Recent Labs  Lab 12/21/2021 1608 12/21/21 1700  NA 136 133*  K 3.5 3.2*  CL 94*  --   CO2 14*  --   GLUCOSE 138*  --   BUN 116*  --   CREATININE 23.72*  --   CALCIUM 7.5*  --    GFR: Estimated Creatinine Clearance: 3.4 mL/min (A) (by C-G formula based on SCr of 23.72 mg/dL (H)). Recent Labs  Lab Dec 21, 2021 1608  WBC 14.4*    Liver  Function Tests: Recent Labs  Lab 21-Dec-2021 1608  AST 25  ALT 22  ALKPHOS 104  BILITOT 0.9  PROT 6.4*  ALBUMIN 3.0*   No results for input(s): LIPASE, AMYLASE in the last 168 hours. No results for input(s): AMMONIA in the last 168 hours.  ABG    Component Value Date/Time   PHART 7.206 (L) December 21, 2021 1700   PCO2ART 46.3 2021/12/21 1700   PO2ART 163 (H) December 21, 2021 1700   HCO3 18.3 (L) 12/21/21 1700   TCO2 20 (L) 2021/12/21 1700   ACIDBASEDEF 9.0 (H) 2021/12/21 1700   O2SAT 99 12/21/2021 1700     Coagulation Profile: Recent Labs  Lab 12-21-21 1608  INR 1.2    Cardiac Enzymes: No results for input(s): CKTOTAL, CKMB, CKMBINDEX, TROPONINI in the last 168 hours.  HbA1C: Hemoglobin A1C  Date/Time Value Ref Range Status  08/09/2014 07:48 AM 5.2 4.2 - 6.3 % Final    Comment:    The American Diabetes Association recommends that a primary goal of therapy should be <7% and that physicians should reevaluate the treatment regimen in patients with HbA1c values consistently >8%.    Hgb A1c MFr Bld  Date/Time Value Ref Range Status  12/06/2020 05:20 AM 6.0 (H) 4.8 - 5.6 % Final    Comment:    (NOTE) Pre diabetes:          5.7%-6.4%  Diabetes:              >6.4%  Glycemic control for   <7.0% adults with diabetes     CBG: No results for input(s): GLUCAP in the last 168 hours.  Review of Systems:   Unable to obtain, intubated, devastating intracranial bleed   Past Medical History:  He,  has a past medical history of Anemia, ESRD on peritoneal dialysis ([place]), Hypertension, and Nephrotic syndrome.   Surgical History:   Past Surgical History:  Procedure Laterality Date   CAPD INSERTION N/A 01/06/2018   Procedure: LAPAROSCOPIC INSERTION CONTINUOUS AMBULATORY PERITONEAL DIALYSIS  (CAPD) CATHETER;  Surgeon: Katha Cabal, MD;  Location: [facility] ORS;  Service: Vascular;  Laterality: N/A;   CHOLECYSTECTOMY     CYSTOSCOPY W/ URETERAL STENT REMOVAL Right 10/23/2017    Procedure: CYSTOSCOPY WITH STENT REPLACEMENT;  Surgeon: [name], MD;  Location: [facility] OR[facility];  Service: Urology;  Laterality: Right;   CYSTOSCOPY WITH STENT PLACEMENT Right 09/30/2017   Procedure: CYSTOSCOPY WITH STENT PLACEMENT;  Surgeon: [name], MD;  Location: [facility] OR[facility];  Service: Urology;  Laterality: Right;   CYSTOSCOPY/RETROGRADE/URETEROSCOPY Right 10/23/2017   Procedure: CYSTOSCOPY/RETROGRADE/URETEROSCOPY;  Surgeon: [name], MD;  Location: [facility] OR[facility];  Service: Urology;  Laterality: Right;   ESOPHAGOGASTRODUODENOSCOPY N/A 03/07/2021   Procedure: ESOPHAGOGASTRODUODENOSCOPY (EGD);  Surgeon: Toledo, Benay Pike, MD;  Location: [facility];  Service: Gastroenterology;  Laterality: N/A;   RENAL BIOPSY     TEMPORARY DIALYSIS CATHETER N/A 12/07/2020   Procedure: TEMPORARY DIALYSIS CATHETER;  Surgeon: Katha Cabal, MD;  Location: [facility];  Service: Cardiovascular;  Laterality: N/A;     Social History:   reports that he quit smoking about 4 months ago. His smoking use included cigarettes. He has a 20.00 pack-year smoking history. He has never used smokeless tobacco. He reports that he does not currently use alcohol. He reports that he does not use drugs.   Family History:  His family history includes CAD in his mother and sister; Diabetes in his mother and sister; Kidney disease in his mother.   Allergies Allergies  Allergen Reactions   Mushroom Extract Complex     Headache, migraines   Amoxicillin Nausea And Vomiting    Weakness  Has patient had a PCN reaction causing immediate rash, facial/tongue/throat swelling, SOB or lightheadedness with hypotension: No Has patient had a PCN reaction causing severe rash involving mucus membranes or skin necrosis: No Has patient had a PCN reaction that required hospitalization: No Has patient had a PCN reaction occurring within the last 10 years: No If all of the above answers are "NO", then may proceed with  Cephalosporin use.      Home Medications  Prior to Admission medications   Medication Sig Start Date End Date Taking? Authorizing Provider  acetaminophen (TYLENOL) 500 MG tablet Take 500-1,000 mg by mouth every 6 (six) hours as needed for mild pain or moderate pain.    [provider]  albuterol (PROVENTIL) (2.5 MG/3ML) 0.083% nebulizer solution USE ONE VIAL IN NEBULIZER EVERY 6 HOURS AS NEEDED FOR WHEEZING OR SHORTNESS OF BREATH. 10/01/21   Jon Billings, NP  albuterol (VENTOLIN HFA) 108 (90 Base) MCG/ACT inhaler Inhale 2 puffs into the lungs every 6 (six) hours as needed for wheezing or shortness of breath. 07/16/21   Jon Billings, NP  amLODipine (NORVASC) 10 MG tablet Take 1 tablet (10 mg total) by mouth daily. 09/11/21   Jon Billings, NP  aspirin 81 MG EC tablet Take 1 tablet by mouth daily. 07/31/21 07/31/22  [provider]  atorvastatin (LIPITOR) 80 MG tablet Take 1 tablet by mouth daily. 07/31/21 07/31/22  [provider]  bumetanide (BUMEX) 2 MG tablet Take 1 tablet by mouth daily. 07/20/21 08/19/21  [provider]  carvedilol (COREG) 25 MG tablet Take 25 mg by mouth 2 (two) times daily. 05/21/21   [provider]  doxycycline (VIBRA-TABS) 100 MG tablet Take 1 tablet (100 mg total) by mouth 2 (two) times daily. 09/04/21   Jon Billings, NP  losartan (COZAAR) 100 MG tablet Take 100 mg by mouth daily. 05/21/21   [provider]  montelukast (SINGULAIR) 10 MG tablet TAKE 1 TABLET BY MOUTH EVERYDAY AT BEDTIME 11/27/21   Jon Billings, NP  ondansetron (ZOFRAN) 4 MG tablet Take 1 tablet (4 mg total) by mouth daily as needed for nausea or vomiting. 03/12/21 03/12/22  Lavina Hamman, MD  sertraline (ZOLOFT) 50 MG tablet Take 1 tablet (50 mg total) by mouth daily. 08/09/21   Jon Billings, NP  tamsulosin (FLOMAX) 0.4 MG CAPS capsule Take 1 tablet by mouth daily. 07/02/21   [provider]  traZODone (DESYREL) 50 MG  tablet TAKE 0.5-1 TABLETS BY MOUTH AT BEDTIME AS NEEDED FOR SLEEP. 08/05/21   Jon Billings, NP     Critical care time: 38 minutes       CRITICAL CARE Performed  by: Cristal Generous   Total critical care time: 38 minutes  Critical care time was exclusive of separately billable procedures and treating other patients.  Critical care was necessary to treat or prevent imminent or life-threatening deterioration.  Critical care was time spent personally by me on the following activities: development of treatment plan with patient and/or surrogate as well as nursing, discussions with consultants, evaluation of patient's response to treatment, examination of patient, obtaining history from patient or surrogate, ordering and performing treatments and interventions, ordering and review of laboratory studies, ordering and review of radiographic studies, pulse oximetry and re-evaluation of patient's condition.  Eliseo Gum MSN, AGACNP-BC [place] for pager  25-Dec-2021, 6:42 PM

## 2021-12-21 NOTE — Progress Notes (Signed)
TOD 2243, verified with Kennieth Rad, RN.  ?

## 2021-12-21 NOTE — Progress Notes (Signed)
eLink Physician-Brief Progress Note ?Patient Name: Larry [name] ?DOB: 06-Mar-1966 ?MRN: [id number] ? ? ?Date of Service ? 2021-12-26  ?HPI/Events of Note ? Notified that family is in the room and had extensively discussed and would like to proceed with comfort measures. Seen on camera. Multiple members including daughters in room. Day team had already discussed such measures with them and they would like to proceed,   ?eICU Interventions ? Discussed plan with them  ?Allowed opportunity to ask questions ?Family appropriately distraught and did not have additional questions at this time ?D/w RN and placed orders for palliative withdrawal of ventilator  ?Patient has remained unresponsive with no sedation on.   ? ? ? ?Intervention Category ?Major Interventions: Respiratory failure - evaluation and management ? ?Larry [name] ?Dec 26, 2021, 9:24 PM ?

## 2021-12-21 NOTE — Care Plan (Signed)
GOALS OF CARE DISCUSSION ?  ?The Clinical status was relayed to patient's wife and daughter at bedside in detail. ?  ?Updated and notified of patients medical condition. ?  ?  ?Patient remains unresponsive and will not open eyes to command.   ?Explained to family course of therapy and the modalities  ?Signs of extensive brain damage ?  ?Patient with catastrophic/non-survivable brain bleed with a very high probablity of a very minimal chance of meaningful recovery despite all aggressive and optimal medical therapy. ? ?Patient's family requested to keep him DNR while all family members can visit before proceeding with comfort care ? ?DNR orders were written ?  ?  ?Family are satisfied with Plan of action and management. All questions answered ? ?  ?Jacky Kindle MD ?[facility] ?See Amion for pager ?If no response to pager, please call [phone] until 7pm ?After 7pm, Please call E-link [phone] ? ?

## 2021-12-21 NOTE — ED Provider Notes (Signed)
[facility] EMERGENCY DEPARTMENT  Provider Note  CSN: [id number] Arrival date & time: 2021/12/23 1608  History Chief Complaint  Patient presents with   Code Stroke    HPI and ROS limited by acuity of condition.  Larry [name] is a 56 y.o. male presented today with concern for possible stroke.  His last known normal was approximately 315 today.  Family reported to EMS that patient got up to go to the bathroom and he noted he was having difficulty walking.  Also noted left-sided weakness.  Family sent the patient down and called EMS.  Patient was responsive on EMSs arrival.  However in route he became only responsive to pain.  Pupils became unequal.  He vomited.  On arrival he was minimally responsive.  Minimal reaction to pain.   Home Medications Prior to Admission medications   Medication Sig Start Date End Date Taking? Authorizing Provider  acetaminophen (TYLENOL) 500 MG tablet Take 500-1,000 mg by mouth every 6 (six) hours as needed for mild pain or moderate pain.    [provider]  albuterol (PROVENTIL) (2.5 MG/3ML) 0.083% nebulizer solution USE ONE VIAL IN NEBULIZER EVERY 6 HOURS AS NEEDED FOR WHEEZING OR SHORTNESS OF BREATH. 10/01/21   Jon Billings, NP  albuterol (VENTOLIN HFA) 108 (90 Base) MCG/ACT inhaler Inhale 2 puffs into the lungs every 6 (six) hours as needed for wheezing or shortness of breath. 07/16/21   Jon Billings, NP  amLODipine (NORVASC) 10 MG tablet Take 1 tablet (10 mg total) by mouth daily. 09/11/21   Jon Billings, NP  aspirin 81 MG EC tablet Take 1 tablet by mouth daily. 07/31/21 07/31/22  [provider]  atorvastatin (LIPITOR) 80 MG tablet Take 1 tablet by mouth daily. 07/31/21 07/31/22  [provider]  bumetanide (BUMEX) 2 MG tablet Take 1 tablet by mouth daily. 07/20/21 08/19/21  [provider]  carvedilol (COREG) 25 MG tablet Take 25 mg by mouth 2 (two) times daily. 05/21/21   [provider]  doxycycline (VIBRA-TABS) 100 MG tablet Take 1 tablet (100 mg total) by mouth 2 (two) times daily. 09/04/21   Jon Billings, NP  losartan (COZAAR) 100 MG tablet Take 100 mg by mouth daily. 05/21/21   [provider]  montelukast (SINGULAIR) 10 MG tablet TAKE 1 TABLET BY MOUTH EVERYDAY AT BEDTIME 11/27/21   Jon Billings, NP  ondansetron (ZOFRAN) 4 MG tablet Take 1 tablet (4 mg total) by mouth daily as needed for nausea or vomiting. 03/12/21 03/12/22  Lavina Hamman, MD  sertraline (ZOLOFT) 50 MG tablet Take 1 tablet (50 mg total) by mouth daily. 08/09/21   Jon Billings, NP  tamsulosin (FLOMAX) 0.4 MG CAPS capsule Take 1 tablet by mouth daily. 07/02/21   [provider]  traZODone (DESYREL) 50 MG tablet TAKE 0.5-1 TABLETS BY MOUTH AT BEDTIME AS NEEDED FOR SLEEP. 08/05/21   Jon Billings, NP     Allergies    Mushroom extract complex and Amoxicillin   Review of Systems   Review of Systems  Unable to perform ROS: Acuity of condition  Please see HPI for pertinent positives and negatives  Physical Exam BP (!) 140/93    Pulse 65    Temp (!) 96.9 F (36.1 C) (Axillary)    Resp (!) 26    Ht 5' 5.5" (1.664 m)    Wt 71.3 kg    SpO2 100%    BMI 25.76 kg/m   Physical Exam Vitals and nursing note  reviewed.  Constitutional:      General: He is in acute distress.     Appearance: He is well-developed. He is ill-appearing and toxic-appearing.  HENT:     Head: Normocephalic and atraumatic.  Eyes:     Conjunctiva/sclera: Conjunctivae normal.     Comments: Pupils were unequal, blown, and unresponsive to light  Cardiovascular:     Rate and Rhythm: Regular rhythm. Tachycardia present.     Heart sounds: No murmur heard. Pulmonary:     Effort: Respiratory distress present.     Comments: Sonorous respirations.  Copious secretions the patient was having difficulty managing. Abdominal:     Palpations: Abdomen is soft.  Musculoskeletal:        General: No  swelling.     Cervical back: Neck supple.  Skin:    General: Skin is warm and dry.     Capillary Refill: Capillary refill takes less than 2 seconds.  Neurological:     Comments: Minimal response to pain.  Does not open eyes.  No verbal response.  GCS 3-5.    ED Results / Procedures / Treatments   EKG None  Procedures Procedure Name: Intubation Date/Time: December 16, 2021 5:59 PM Performed by: Jacelyn Pi, MD Pre-anesthesia Checklist: Patient identified, Emergency Drugs available, Suction available, Patient being monitored and Timeout performed Oxygen Delivery Method: Ambu bag Preoxygenation: Pre-oxygenation with 100% oxygen Induction Type: Rapid sequence Laryngoscope Size: Glidescope Grade View: Grade I Tube size: 7.5 mm Number of attempts: 1 Airway Equipment and Method: Rigid stylet Placement Confirmation: ETT inserted through vocal cords under direct vision, Positive ETCO2, Breath sounds checked- equal and bilateral and CO2 detector Secured at: 24 cm Tube secured with: ETT holder Dental Injury: Teeth and Oropharynx as per pre-operative assessment      Medications Ordered in the ED Medications  fentaNYL (SUBLIMAZE) injection 50 mcg (has no administration in time range)  fentaNYL 2593mg in NS 254m(1018mml) infusion-PREMIX (has no administration in time range)  fentaNYL (SUBLIMAZE) bolus via infusion 50 mcg (has no administration in time range)  clevidipine (CLEVIPREX) infusion 0.5 mg/mL (0 mg/hr Intravenous Stopped 3/9Mar 27, 202334)  sodium chloride (hypertonic) 3 % solution (0 mL/hr Intravenous Stopped 3/903-27-2335)   stroke: mapping our early stages of recovery book (has no administration in time range)  acetaminophen (TYLENOL) tablet 650 mg (has no administration in time range)    Or  acetaminophen (TYLENOL) 160 MG/5ML solution 650 mg (has no administration in time range)    Or  acetaminophen (TYLENOL) suppository 650 mg (has no administration in time range)   senna-docusate (Senokot-S) tablet 1 tablet (has no administration in time range)  pantoprazole (PROTONIX) injection 40 mg (has no administration in time range)  acetaminophen (TYLENOL) 160 MG/5ML solution 650 mg (has no administration in time range)  bisacodyl (DULCOLAX) suppository 10 mg (has no administration in time range)  dexamethasone (DECADRON) injection 4 mg (4 mg Intravenous Given 3/92023-11-2732)  glycopyrrolate (ROBINUL) injection 0.2 mg (has no administration in time range)  polyvinyl alcohol (LIQUIFILM TEARS) 1.4 % ophthalmic solution 1-2 drop (has no administration in time range)  fentaNYL (SUBLIMAZE) injection 25 mcg (25 mcg Intravenous Given 3/903-27-2306)  etomidate (AMIDATE) injection (20 mg Intravenous Given 3/92023/11/2718)  rocuronium (ZEMURON) injection (100 mg Intravenous Given 3/903/27/202320)  labetalol (NORMODYNE) injection 20 mg (20 mg Intravenous Given 3/92023/11/2721)  sodium chloride 3% (hypertonic) IV bolus 250 mL ( Intravenous Stopped 3/903-27-2330)  iohexol (OMNIPAQUE) 350 MG/ML injection 100 mL (100 mLs Intravenous Contrast Given  12/05/21 1646)     ED Course       MDM   This patient presents to the ED for concern of altered mental statsu, this involves an extensive number of treatment options, and is a complaint that carries with it a high risk of complications and morbidity.  The differential diagnosis includes stroke, uremia, ICH, metabolic abnormality, encephalopathy. Patients presentation is complicated by their history of home PD   Additional history obtained: Additional history obtained from family and EMS  Records reviewed previous admission documents, Care Everywhere/External Records, and Primary Care Documents  Lab Tests: I Ordered, and personally interpreted labs.  The pertinent results include:  uremia. Metabolic acidosis. Elevated AG.   Imaging Studies ordered: I ordered imaging studies including CT scan head   I independently visualized and interpreted  imaging which showed large ICH with midline shift I agree with the radiologist interpretation   Medical Decision Making: Patient presented acutely altered.  Last abnormal was approximately 3:15 PM today.  He was minimally responsive on arrival.  He was met by myself, my attending, and to the neurology stroke team on his arrival.  GCS was less than 8.  He was unable to protect his airway.  He was taken quickly to room 21 and was intubated as per above.  Patient then was taken quickly to CT scan as he was hypertensive, altered, minimally responsive, and had blown pupils.  CT head revealed a very large right-sided intracranial hemorrhage.  Neurosurgery was consulted.  They do not feel that any intervention they could offer would be helpful.  Patient sedated with propofol.  Started on Cleviprex for blood pressure control.  Started on hypertonic saline to help with edema.  Neurology discussing plan of care with family. Plan for admission to Neuro ICU per Dr. Rory Percy  Complexity of problems addressed: Patients presentation is most consistent with  acute presentation with potential threat to life or bodily function  Disposition: After consideration of the diagnostic results and the patients response to treatment,  I feel that the patent would benefit from admission to ICU .   Patient seen in conjunction with my attending, Dr. Billy Fischer.    Final Clinical Impression(s) / ED Diagnoses Final diagnoses:  Intracranial hemorrhage ([place])  Altered mental status, unspecified altered mental status type  Uremia    Rx / DC Orders ED Discharge Orders     None         Jacelyn Pi, MD 12-05-2021 2212    Gareth Morgan, MD 11/29/21 1133

## 2021-12-21 NOTE — ED Notes (Addendum)
20mg  Lebatolol Verbal Neuro MD, Given by Trudee Kuster RN at 1623, through 20g Lt AC IV ?

## 2021-12-21 NOTE — Procedures (Signed)
Extubation Procedure Note ? ?Patient Details:   ?Name: Larry [name] ?DOB: Feb 14, 1966 ?MRN: [id number] ?  ?Airway Documentation:  ?  ?Vent end date: Dec 17, 2021 Vent end time: 2210  ? ?Evaluation ? O2 sats: currently acceptable ?Complications: No apparent complications ?Patient did tolerate procedure well. ?Bilateral Breath Sounds: Clear, Diminished ?  ?No ? ?Patient extubated to comfort care per MD order. No complications noted. ? ?Alvera Singh ?11/29/2021, 12:05 AM ? ?

## 2021-12-21 DEATH — deceased

## 2022-02-07 ENCOUNTER — Other Ambulatory Visit: Payer: Self-pay | Admitting: Nurse Practitioner

## 2022-03-19 IMAGING — CT CT ANGIO HEAD-NECK (W OR W/O PERF)
1 of 8 series · 6 of 33 positions shown · IV contrast (OMNI 350)
Comparison: None.

CLINICAL DATA: Stroke follow-up.

EXAM:
CT ANGIOGRAPHY HEAD AND NECK
TECHNIQUE: Multidetector CT imaging of the head and neck was performed using
the standard protocol during bolus administration of intravenous
contrast. Multiplanar CT image reconstructions and MIPs were
obtained to evaluate the vascular anatomy. Carotid stenosis
measurements (when applicable) are obtained utilizing NASCET
criteria, using the distal internal carotid diameter as the
denominator.

[Series 7: cta neck axial · axial · 0.35mm/px · z∈[-274,-41]mm · 6 of 329 slices shown]
[im 47/329  soft-tissue]
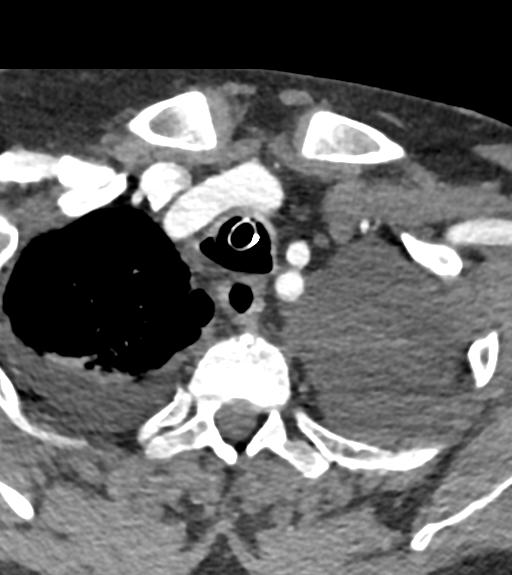
[im 94/329  bone]
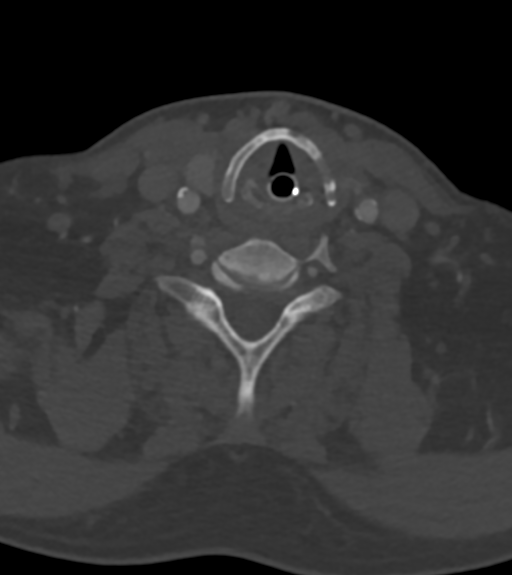
[im 141/329  soft-tissue]
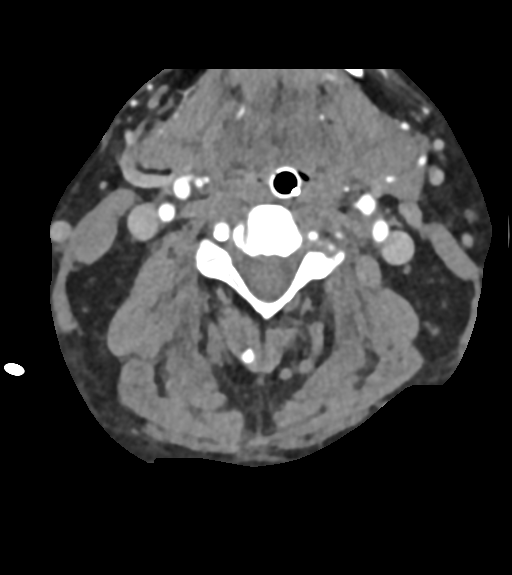
[im 188/329  bone]
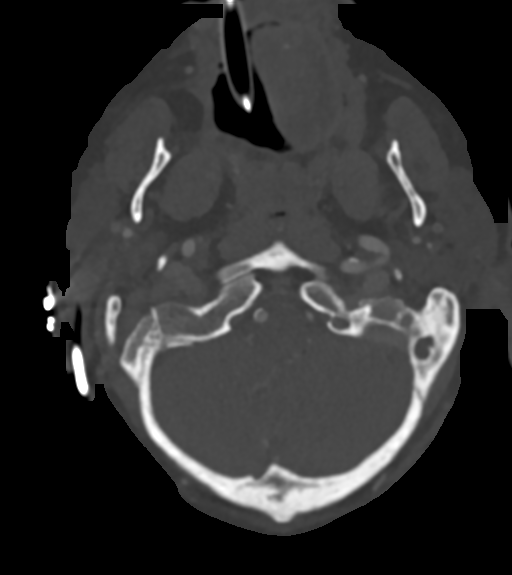
[im 235/329  soft-tissue]
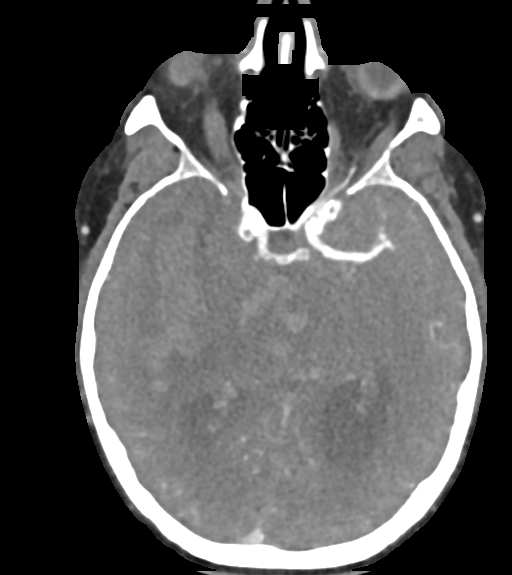
[im 282/329  bone]
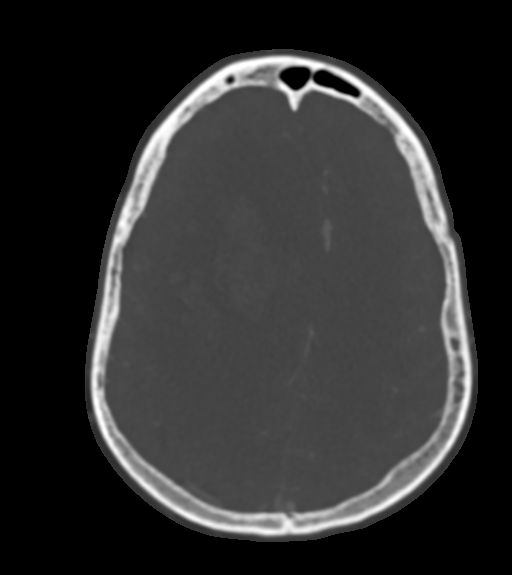

[6 of 33 positions shown; findings below may reference images not displayed]

RADIATION DOSE REDUCTION: This exam was performed according to the
departmental dose-optimization program which includes automated
exposure control, adjustment of the mA and/or kV according to
patient size and/or use of iterative reconstruction technique.

CONTRAST:  100mL OMNIPAQUE IOHEXOL 350 MG/ML SOLN
FINDINGS: CTA NECK FINDINGS

Aortic arch: Normal variant aortic arch branching pattern with
common origin of the brachiocephalic and left common carotid
arteries. Mild, nonstenotic calcified plaque in the brachiocephalic
and subclavian arteries.

Right carotid system: Patent with scattered calcified and soft
plaque in the common carotid and proximal internal carotid arteries.
No evidence of a significant stenosis or dissection.

Left carotid system: Patent with scattered calcified and soft plaque
in the common carotid artery and a moderate amount of calcified
plaque about the carotid bifurcation. No evidence of a significant
stenosis or dissection.

Vertebral arteries: Patent with the right vertebral artery being
strongly dominant. Nonstenotic plaque at both vertebral origins.
Scattered calcified plaque in the right V2 segment resulting in less
than 50% stenosis.

Skeleton: Focally advanced facet arthritis on the left at C3-4 with
erosions.

Other neck: No evidence of cervical lymphadenopathy or mass.

Upper chest: Partially visualized moderate bilateral pleural
effusions with associated compressive atelectasis, left greater than
right. Endotracheal tube terminating above the carina.

Review of the MIP images confirms the above findings

CTA HEAD FINDINGS

Anterior circulation: The internal carotid arteries are patent from
skull base to carotid termini with mild stenosis of the right
supraclinoid ICA. ACAs and MCAs are patent without evidence of a
proximal branch occlusion or significant proximal stenosis. Right
MCA and ACA branches are displaced by the large right cerebral
hemorrhage. No aneurysm, vascular malformation, or CTA spot sign is
identified.

Posterior circulation: The intracranial vertebral arteries are
patent to the basilar with atherosclerotic plaque resulting in
multifocal stenoses which are mild on the right and up to severe on
the left. Patent PICA origins are seen bilaterally. The left SCA
appears occluded proximally near its origin. The basilar artery is
widely patent. Both PCAs are patent with mild atherosclerotic
irregularity but no evidence of a significant proximal stenosis.
Posterior communicating arteries are diminutive or absent. No
aneurysm is identified.

Venous sinuses: Patent.

Anatomic variants: None.

Delayed phase: Large parenchymal hemorrhage in the right cerebral
hemisphere centered in the basal ganglia/thalamus with
intraventricular and subarachnoid extension as described on the
preceding noncontrast head CT. No definite abnormal intracranial
enhancement.

Review of the MIP images confirms the above findings
IMPRESSION: 1. No large vessel occlusion.
2. Proximal occlusion of the left SCA.
3. Intracranial atherosclerosis with stenoses as above.
4. Cervical carotid atherosclerosis without significant stenosis.
5. Moderate bilateral pleural effusions.
6. Aortic Atherosclerosis ([id number]).

## 2022-06-27 ENCOUNTER — Ambulatory Visit: Payer: Medicare Other
